# Patient Record
Sex: Female | Born: 1945 | Race: Black or African American | Hispanic: No | Marital: Married | Smoking: Never smoker
Health system: Southern US, Community
[De-identification: ages and names within clinical notes are randomized; demographics above are authoritative.]

## PROBLEM LIST (undated history)

## (undated) DIAGNOSIS — G629 Polyneuropathy, unspecified: Secondary | ICD-10-CM

## (undated) DIAGNOSIS — D689 Coagulation defect, unspecified: Secondary | ICD-10-CM

## (undated) DIAGNOSIS — L930 Discoid lupus erythematosus: Secondary | ICD-10-CM

## (undated) DIAGNOSIS — R011 Cardiac murmur, unspecified: Secondary | ICD-10-CM

## (undated) DIAGNOSIS — I341 Nonrheumatic mitral (valve) prolapse: Secondary | ICD-10-CM

## (undated) DIAGNOSIS — Z972 Presence of dental prosthetic device (complete) (partial): Secondary | ICD-10-CM

## (undated) DIAGNOSIS — J302 Other seasonal allergic rhinitis: Secondary | ICD-10-CM

## (undated) DIAGNOSIS — Z86718 Personal history of other venous thrombosis and embolism: Secondary | ICD-10-CM

## (undated) DIAGNOSIS — G4730 Sleep apnea, unspecified: Secondary | ICD-10-CM

## (undated) DIAGNOSIS — G5600 Carpal tunnel syndrome, unspecified upper limb: Secondary | ICD-10-CM

## (undated) DIAGNOSIS — M65332 Trigger finger, left middle finger: Secondary | ICD-10-CM

## (undated) DIAGNOSIS — IMO0001 Reserved for inherently not codable concepts without codable children: Secondary | ICD-10-CM

## (undated) DIAGNOSIS — M329 Systemic lupus erythematosus, unspecified: Secondary | ICD-10-CM

## (undated) DIAGNOSIS — Z8669 Personal history of other diseases of the nervous system and sense organs: Secondary | ICD-10-CM

## (undated) DIAGNOSIS — K08109 Complete loss of teeth, unspecified cause, unspecified class: Secondary | ICD-10-CM

## (undated) DIAGNOSIS — K219 Gastro-esophageal reflux disease without esophagitis: Secondary | ICD-10-CM

## (undated) DIAGNOSIS — E11319 Type 2 diabetes mellitus with unspecified diabetic retinopathy without macular edema: Secondary | ICD-10-CM

## (undated) DIAGNOSIS — E119 Type 2 diabetes mellitus without complications: Secondary | ICD-10-CM

## (undated) DIAGNOSIS — M1990 Unspecified osteoarthritis, unspecified site: Secondary | ICD-10-CM

## (undated) DIAGNOSIS — Z794 Long term (current) use of insulin: Secondary | ICD-10-CM

## (undated) DIAGNOSIS — R05 Cough: Secondary | ICD-10-CM

## (undated) DIAGNOSIS — I10 Essential (primary) hypertension: Secondary | ICD-10-CM

## (undated) HISTORY — DX: Polyneuropathy, unspecified: G62.9

## (undated) HISTORY — PX: CATARACT EXTRACTION W/ INTRAOCULAR LENS  IMPLANT, BILATERAL: SHX1307

## (undated) HISTORY — DX: Carpal tunnel syndrome, unspecified upper limb: G56.00

## (undated) HISTORY — DX: Cardiac murmur, unspecified: R01.1

## (undated) HISTORY — PX: GLAUCOMA SURGERY: SHX656

## (undated) HISTORY — PX: BACK SURGERY: SHX140

## (undated) HISTORY — PX: JOINT REPLACEMENT: SHX530

## (undated) HISTORY — DX: Coagulation defect, unspecified: D68.9

## (undated) HISTORY — DX: Systemic lupus erythematosus, unspecified: M32.9

---

## 1994-03-16 DIAGNOSIS — Z86718 Personal history of other venous thrombosis and embolism: Secondary | ICD-10-CM

## 1994-03-16 HISTORY — DX: Personal history of other venous thrombosis and embolism: Z86.718

## 1998-10-29 ENCOUNTER — Emergency Department (HOSPITAL_COMMUNITY): Admission: EM | Admit: 1998-10-29 | Discharge: 1998-10-29 | Payer: Self-pay | Admitting: Emergency Medicine

## 1998-11-01 ENCOUNTER — Encounter: Admission: RE | Admit: 1998-11-01 | Discharge: 1998-11-01 | Payer: Self-pay | Admitting: Internal Medicine

## 1998-11-06 ENCOUNTER — Encounter: Admission: RE | Admit: 1998-11-06 | Discharge: 1998-11-06 | Payer: Self-pay | Admitting: Internal Medicine

## 1998-11-20 ENCOUNTER — Encounter: Admission: RE | Admit: 1998-11-20 | Discharge: 1998-11-20 | Payer: Self-pay | Admitting: Internal Medicine

## 1998-11-29 ENCOUNTER — Encounter: Admission: RE | Admit: 1998-11-29 | Discharge: 1998-11-29 | Payer: Self-pay | Admitting: Internal Medicine

## 1998-12-09 ENCOUNTER — Encounter: Admission: RE | Admit: 1998-12-09 | Discharge: 1998-12-09 | Payer: Self-pay | Admitting: Internal Medicine

## 1999-02-27 ENCOUNTER — Encounter: Payer: Self-pay | Admitting: Orthopedic Surgery

## 1999-02-27 ENCOUNTER — Ambulatory Visit (HOSPITAL_COMMUNITY): Admission: RE | Admit: 1999-02-27 | Discharge: 1999-02-27 | Payer: Self-pay | Admitting: Orthopedic Surgery

## 1999-05-27 ENCOUNTER — Encounter: Payer: Self-pay | Admitting: Nephrology

## 1999-05-27 ENCOUNTER — Encounter: Admission: RE | Admit: 1999-05-27 | Discharge: 1999-05-27 | Payer: Self-pay | Admitting: Nephrology

## 1999-06-05 ENCOUNTER — Encounter: Payer: Self-pay | Admitting: Nephrology

## 1999-06-05 ENCOUNTER — Ambulatory Visit (HOSPITAL_COMMUNITY): Admission: RE | Admit: 1999-06-05 | Discharge: 1999-06-05 | Payer: Self-pay | Admitting: Nephrology

## 1999-08-16 ENCOUNTER — Ambulatory Visit: Admission: RE | Admit: 1999-08-16 | Discharge: 1999-08-16 | Payer: Self-pay | Admitting: Nephrology

## 1999-10-17 ENCOUNTER — Encounter: Admission: RE | Admit: 1999-10-17 | Discharge: 1999-10-17 | Payer: Self-pay | Admitting: Nephrology

## 1999-10-17 ENCOUNTER — Encounter: Payer: Self-pay | Admitting: Nephrology

## 1999-11-02 ENCOUNTER — Ambulatory Visit (HOSPITAL_BASED_OUTPATIENT_CLINIC_OR_DEPARTMENT_OTHER): Admission: RE | Admit: 1999-11-02 | Discharge: 1999-11-02 | Payer: Self-pay | Admitting: Nephrology

## 2000-04-28 ENCOUNTER — Other Ambulatory Visit: Admission: RE | Admit: 2000-04-28 | Discharge: 2000-04-28 | Payer: Self-pay | Admitting: Obstetrics and Gynecology

## 2000-08-06 ENCOUNTER — Encounter: Admission: RE | Admit: 2000-08-06 | Discharge: 2000-08-06 | Payer: Self-pay | Admitting: Nephrology

## 2000-08-06 ENCOUNTER — Encounter: Payer: Self-pay | Admitting: Nephrology

## 2001-06-15 ENCOUNTER — Other Ambulatory Visit: Admission: RE | Admit: 2001-06-15 | Discharge: 2001-06-15 | Payer: Self-pay | Admitting: Obstetrics and Gynecology

## 2001-09-05 ENCOUNTER — Encounter: Payer: Self-pay | Admitting: Nephrology

## 2001-09-05 ENCOUNTER — Encounter: Admission: RE | Admit: 2001-09-05 | Discharge: 2001-09-05 | Payer: Self-pay | Admitting: Nephrology

## 2002-07-31 ENCOUNTER — Encounter: Admission: RE | Admit: 2002-07-31 | Discharge: 2002-10-29 | Payer: Self-pay | Admitting: Nephrology

## 2002-12-20 ENCOUNTER — Encounter: Admission: RE | Admit: 2002-12-20 | Discharge: 2003-03-20 | Payer: Self-pay | Admitting: Nephrology

## 2003-04-19 ENCOUNTER — Encounter: Admission: RE | Admit: 2003-04-19 | Discharge: 2003-07-18 | Payer: Self-pay | Admitting: Nephrology

## 2004-02-16 ENCOUNTER — Emergency Department (HOSPITAL_COMMUNITY): Admission: EM | Admit: 2004-02-16 | Discharge: 2004-02-17 | Payer: Self-pay | Admitting: Emergency Medicine

## 2004-02-26 ENCOUNTER — Other Ambulatory Visit: Admission: RE | Admit: 2004-02-26 | Discharge: 2004-02-26 | Payer: Self-pay | Admitting: Obstetrics and Gynecology

## 2004-09-18 ENCOUNTER — Ambulatory Visit: Payer: Self-pay | Admitting: Internal Medicine

## 2004-10-02 ENCOUNTER — Ambulatory Visit: Payer: Self-pay | Admitting: Internal Medicine

## 2005-02-26 ENCOUNTER — Other Ambulatory Visit: Admission: RE | Admit: 2005-02-26 | Discharge: 2005-02-26 | Payer: Self-pay | Admitting: Obstetrics and Gynecology

## 2005-07-02 ENCOUNTER — Encounter: Admission: RE | Admit: 2005-07-02 | Discharge: 2005-07-02 | Payer: Self-pay | Admitting: Nephrology

## 2007-02-01 ENCOUNTER — Inpatient Hospital Stay (HOSPITAL_COMMUNITY): Admission: RE | Admit: 2007-02-01 | Discharge: 2007-02-04 | Payer: Self-pay | Admitting: Orthopaedic Surgery

## 2007-02-01 HISTORY — PX: TOTAL KNEE ARTHROPLASTY: SHX125

## 2007-02-03 ENCOUNTER — Ambulatory Visit: Payer: Self-pay | Admitting: *Deleted

## 2007-04-13 ENCOUNTER — Encounter: Admission: RE | Admit: 2007-04-13 | Discharge: 2007-06-13 | Payer: Self-pay | Admitting: Orthopaedic Surgery

## 2007-09-22 ENCOUNTER — Inpatient Hospital Stay (HOSPITAL_COMMUNITY): Admission: RE | Admit: 2007-09-22 | Discharge: 2007-09-27 | Payer: Self-pay | Admitting: Orthopaedic Surgery

## 2007-09-22 HISTORY — PX: TOTAL KNEE ARTHROPLASTY: SHX125

## 2007-09-26 ENCOUNTER — Ambulatory Visit: Payer: Self-pay | Admitting: Vascular Surgery

## 2007-09-26 ENCOUNTER — Encounter (INDEPENDENT_AMBULATORY_CARE_PROVIDER_SITE_OTHER): Payer: Self-pay | Admitting: Orthopaedic Surgery

## 2007-10-31 ENCOUNTER — Encounter: Admission: RE | Admit: 2007-10-31 | Discharge: 2007-12-01 | Payer: Self-pay | Admitting: Orthopaedic Surgery

## 2007-12-28 ENCOUNTER — Encounter: Admission: RE | Admit: 2007-12-28 | Discharge: 2007-12-28 | Payer: Self-pay | Admitting: Orthopaedic Surgery

## 2008-06-13 ENCOUNTER — Encounter
Admission: RE | Admit: 2008-06-13 | Discharge: 2008-07-19 | Payer: Self-pay | Admitting: Physical Medicine and Rehabilitation

## 2008-09-11 ENCOUNTER — Encounter: Admission: RE | Admit: 2008-09-11 | Discharge: 2008-09-11 | Payer: Self-pay | Admitting: Orthopaedic Surgery

## 2009-01-30 ENCOUNTER — Inpatient Hospital Stay (HOSPITAL_COMMUNITY): Admission: RE | Admit: 2009-01-30 | Discharge: 2009-01-31 | Payer: Self-pay | Admitting: Neurosurgery

## 2009-01-30 HISTORY — PX: HEMILAMINOTOMY LUMBAR SPINE: SUR654

## 2009-09-18 ENCOUNTER — Encounter (INDEPENDENT_AMBULATORY_CARE_PROVIDER_SITE_OTHER): Payer: Self-pay | Admitting: *Deleted

## 2010-01-03 ENCOUNTER — Encounter: Admission: RE | Admit: 2010-01-03 | Discharge: 2010-01-03 | Payer: Self-pay | Admitting: Nephrology

## 2010-04-15 NOTE — Letter (Signed)
Summary: Colonoscopy Letter  [facility]  [address] [place], [place] [address]   Phone: [phone]  Fax: [phone]      September 18, 2009 MRN: [id number]   [facility] [address] [place], [place]  [address]   Dear Ms. Boutin,   According to your medical record, it is time for you to schedule a Colonoscopy. The American Cancer Society recommends this procedure as a method to detect early colon cancer. Patients with a family history of colon cancer, or a personal history of colon polyps or inflammatory bowel disease are at increased risk.  This letter has beeen generated based on the recommendations made at the time of your procedure. If you feel that in your particular situation this may no longer apply, please contact our office.  Please call our office at [phone] to schedule this appointment or to update your records at your earliest convenience.  Thank you for cooperating with [place] to provide you with the very best care possible.   Sincerely,  Hedwig Morton. Juanda Chance, M.D.  [facility] [phone]

## 2010-05-19 ENCOUNTER — Other Ambulatory Visit: Payer: Self-pay | Admitting: Nephrology

## 2010-05-19 ENCOUNTER — Ambulatory Visit
Admission: RE | Admit: 2010-05-19 | Discharge: 2010-05-19 | Disposition: A | Discharge status: Home / Self Care | Payer: Medicare Other | Source: Ambulatory Visit | Attending: Nephrology | Admitting: Nephrology

## 2010-05-19 DIAGNOSIS — R52 Pain, unspecified: Secondary | ICD-10-CM

## 2010-06-18 LAB — BASIC METABOLIC PANEL
BUN: 11 mg/dL (ref 6–23)
CO2: 28 mEq/L (ref 19–32)
Calcium: 9.3 mg/dL (ref 8.4–10.5)
Chloride: 103 mEq/L (ref 96–112)
Creatinine, Ser: 0.87 mg/dL (ref 0.4–1.2)
GFR calc Af Amer: >60 mL/min (ref >60)
GFR calc non Af Amer: >60 mL/min (ref >60)
Glucose, Bld: 272 mg/dL — ABNORMAL HIGH (ref 70–99)
Potassium: 4.7 mEq/L (ref 3.5–5.1)
Sodium: 138 mEq/L (ref 135–145)

## 2010-06-18 LAB — PROTIME-INR
INR: 0.95 (ref 0.00–1.49)
Prothrombin Time: 12.6 seconds (ref 11.6–15.2)

## 2010-06-18 LAB — GLUCOSE, CAPILLARY
Glucose-Capillary: 276 mg/dL — ABNORMAL HIGH (ref 70–99)
Glucose-Capillary: 289 mg/dL — ABNORMAL HIGH (ref 70–99)
Glucose-Capillary: 312 mg/dL — ABNORMAL HIGH (ref 70–99)
Glucose-Capillary: 327 mg/dL — ABNORMAL HIGH (ref 70–99)
Glucose-Capillary: 351 mg/dL — ABNORMAL HIGH (ref 70–99)
Glucose-Capillary: 401 mg/dL — ABNORMAL HIGH (ref 70–99)
Glucose-Capillary: 449 mg/dL — ABNORMAL HIGH (ref 70–99)

## 2010-06-18 LAB — APTT: aPTT: 28 seconds (ref 24–37)

## 2010-06-18 LAB — CBC
HCT: 38.6 % (ref 36.0–46.0)
Hemoglobin: 13 g/dL (ref 12.0–15.0)
MCHC: 33.8 g/dL (ref 30.0–36.0)
MCV: 89.3 fL (ref 78.0–100.0)
Platelets: 319 10*3/uL (ref 150–400)
RBC: 4.32 MIL/uL (ref 3.87–5.11)
RDW: 14.9 % (ref 11.5–15.5)
WBC: 6.2 10*3/uL (ref 4.0–10.5)

## 2010-06-18 LAB — GLUCOSE, RANDOM: Glucose, Bld: 455 mg/dL — ABNORMAL HIGH (ref 70–99)

## 2010-07-14 ENCOUNTER — Other Ambulatory Visit: Payer: Self-pay | Admitting: Neurosurgery

## 2010-07-14 DIAGNOSIS — M542 Cervicalgia: Secondary | ICD-10-CM

## 2010-07-15 ENCOUNTER — Ambulatory Visit
Admission: RE | Admit: 2010-07-15 | Discharge: 2010-07-15 | Disposition: A | Discharge status: Home / Self Care | Payer: Medicare Other | Source: Ambulatory Visit | Attending: Neurosurgery | Admitting: Neurosurgery

## 2010-07-15 DIAGNOSIS — M542 Cervicalgia: Secondary | ICD-10-CM

## 2010-07-25 ENCOUNTER — Other Ambulatory Visit (HOSPITAL_COMMUNITY): Payer: Self-pay | Admitting: Neurosurgery

## 2010-07-25 DIAGNOSIS — M542 Cervicalgia: Secondary | ICD-10-CM

## 2010-07-29 NOTE — Op Note (Signed)
[name], Jackson            ACCOUNT NO.:  [id number]   MEDICAL RECORD NO.:  [id number]          PATIENT TYPE:  INP   LOCATION:  5002                         FACILITY:  [facility]   PHYSICIAN:  Claude Manges. Whitfield, M.D.DATE OF BIRTH:  1945/12/22   DATE OF PROCEDURE:  09/22/2007  DATE OF DISCHARGE:                               OPERATIVE REPORT   PREOPERATIVE DIAGNOSIS:  End-stage osteoarthritis, right knee.   POSTOPERATIVE DIAGNOSIS:  End-stage osteoarthritis, right knee.   PROCEDURE:  Right total knee replacement.   SURGEON:  Claude Manges. Cleophas Dunker, MD.   ASSISTANTArlys John D. Petrarca, PA-C.   ANESTHESIA:  General with supplemental femoral nerve block.   COMPLICATIONS:  None.   COMPONENTS:  DePuy LCS standard femoral component, #2.5 keeled tibial  tray with a 10-mm polyethylene bridging bearing, and 3-Peg metal-back  rotating patella, all was secured polymethyl methacrylate.   PROCEDURE:  With the patient comfortable on the operating room table,  she was then placed under general orotracheal anesthesia.  She received  a preoperative femoral nerve block in the holding area.  A Foley  catheter was inserted by the nursing staff, the urine was clear.   Tourniquet was applied to the right lower extremity which had been  appropriately marked as the operative knee in the holding area.  The leg  was then elevated and was prepped with Betadine scrub and DuraPrep from  the tourniquet to the midfoot.  Sterile draping was performed, with the  extremity still elevated and was Esmarch exsanguinated with the proximal  tourniquet at 350 mmHg.   A midline longitudinal incision was then made centered about the patella  extending from the superior pouch tibial tubercle.  Via sharp  dissection, the incision carried down to the subcutaneous tissue.  The  first layer of capsule was incised in the midline with the Bovie.  The  medial parapatellar incision was made to the deep capsule with the  Bovie.  There was a clear yellow joint effusion.   The patella was everted to 180 degrees.  The knee then flexed to 90  degrees.  The joint was inspected.  There were large osteophytes  circumferentially about the patella and both femoral condyles.  There  was complete absence of articular cartilage in the medial compartment.  Osteophytes were removed from the medial and lateral femoral condyle and  medial tibial plateau.  Synovectomy was performed.   Preoperatively, we had templated the standard femoral component.  This  was confirmed intraoperatively.  We also confirmed a 2.5 size 2.5 tibial  tray.   Initial cut was then made transversely in the proximal tibia using the  external tibial guide.  There was approximately 7-degree of posterior  declination.  A second cut was then made on the femur anteriorly and  then posteriorly.  We tried flexion and extension gaps, were a little  bit tight in extension, so we made one more cut in the distal femur with  excellent flexion/extension gaps, i.e., symmetrical medial release was  necessary.   The final femoral cut was then made to taper the cuts.  Lamina spreader  was  inserted.  I removed the medial and lateral menisci and any remnants  of ACL and PCL.  Osteophytes were removed from the posterior femoral  condyle.   Retractor was then placed about the tibia and was advanced anteriorly.  A #2.5 tibial tray was perfect fit; this was applied.  Center hole made  followed by the keeled cut.  The 10 mm bridging bearing was applied to  the trial tibial template followed by the standard femoral component.  The knee was then placed through a full range of motion.  There was no  opening with varus or valgus stress.  Negative anterior drawer sign.   The patella was then prepared by removing 10 mm of bone leaving 13-mm  patella thickness.  The patellar jig was applied to 3 holes made, and  the trial patella inserted.  Again through full range of  motion, there  was no subluxation of the patella.   At that point, the trial components were removed.  The joint was  copiously irrigated with jet saline.  The knee was then flexed.  Retractor was inserted.  The #2.5 tibial tray was then applied with  polymethyl methacrylate followed by the 10-mm bridging bearing.  The  standard femoral component was applied with polymethyl methacrylate.  The components were then packed and the knee was placed in extension and  then flexion and any extraneous methacrylate was removed with Glorious Peach.  The patella was applied with methacrylate and patellar clamp.  While  waiting for the methacrylate to mature, we injected Marcaine with  epinephrine into the deep capsule and 10 mL of FloSeal around the joint  for hemostasis.  At approximately 15 minutes, the methacrylate was  hardened.  We gently irrigated the joint.  Tourniquet was deflated.  We  had very minimal bleeding.  There was nice capillary refill to the joint  surface.   Hemovac was not necessary.   The deep capsule was closed with interrupted #1 Ethibond.  Superficial  capsule was closed with a running 0 Vicryl, subcu was closed with 0 and  2-0 Vicryl, and skin was closed with skin clips.  Sterile bulky dressing  was applied followed by the patient's support stocking.   The patient tolerated the procedure without complications.      Claude Manges. Cleophas Dunker, M.D.  Electronically Signed     PWW/MEDQ  D:  09/22/2007  T:  09/23/2007  Job:  [id number]

## 2010-07-29 NOTE — Op Note (Signed)
NAMEKYASIA, STEUCK            ACCOUNT NO.:  [id number]   MEDICAL RECORD NO.:  [id number]          PATIENT TYPE:  INP   LOCATION:  2899                         FACILITY:  [facility]   PHYSICIAN:  Claude Manges. Whitfield, M.D.DATE OF BIRTH:  08-07-1945   DATE OF PROCEDURE:  02/01/2007  DATE OF DISCHARGE:                               OPERATIVE REPORT   PREOPERATIVE DIAGNOSIS:  End stage osteoarthritis, left knee.   POSTOPERATIVE DIAGNOSIS:  End stage osteoarthritis, left knee.   PROCEDURE:  Left total knee replacement.   SURGEON:  Claude Manges. Cleophas Dunker, M.D.   ASSISTANT:  Richardean Canal, P.A.- C.   ANESTHESIA:  General with supplemental femoral nerve block.   COMPLICATIONS:  None.   COMPONENTS:  DePuy LCS standard femoral component, number 3 keeled  tibial tray with a 15 mm bridging bearing and metal backed three peg  rotating patella, all were secured with polymethyl methacrylate.   PROCEDURE IN DETAIL:  With the patient comfortable on the operating  table and under general orotracheal anesthesia, the nursing staff  inserted a Foley catheter.  The left lower extremity was then placed in  a thigh tourniquet.  The leg was prepped with Betadine scrub and then  DuraPrep from the tourniquet to the mid foot.  Sterile draping was  performed.  With the leg still elevated, it was Esmarch exsanguinated  with a proximal tourniquet at 350 mmHg.   A midline longitudinal incision was made centered over the patella  extending from the tibial tubercle to the superior pouch. Via sharp  dissection, the incision was carried down to the subcutaneous tissue.  The first layer of capsule was incised in the midline.  A medial  parapatellar incision was made with the Bovie.  There was a clear yellow  joint effusion.  The patella was then everted 180 degrees and the knee  flexed to 90 degrees. The patient had a fixed varus deformity. A medial  release was performed.  There were large osteophytes along the  medial  and lateral femoral condyle which were resected to template the tibia  and there were osteophytes along the medial tibial plateau which were  also excised.  Synovectomy was performed.  There were several loose  bodies identified and these were removed, as well.  Preoperatively, we  had measured a standard femoral components and this was confirmed  intraoperatively.  Subsequent cuts were then made on the tibia and femur  to accept the components.   The first cut was made transversely on the proximal tibia with a 7  degrees posterior angle of declination. Subsequent cuts were then made  on the femur with a 4 degrees distal femoral valgus cut.  Flexion/extension gaps were symmetrical at 12.5.  LCL and MCL remained  intact. ACL and PCL were excised as well as the medial and lateral  menisci using the lamina spreader after completing the femoral cuts.  Osteophytes were removed from the posterior aspect of the medial and  lateral femoral condyle.  The popliteus was incised laterally as we were  tighter laterally that we were medially after the medial release   Retractors were  then placed about the tibia.  We measured a #3 tibial  tray.  This was confirmed intraoperatively.  The center hole was then  made followed by the keeled.  We initially trialed a 12.5 mm bridging  bearing and I felt we opened up a little bit too much medially and  laterally with hyperextension.  A 15 mm bridging bearing was then  inserted.  We had perfect stability with a negative anterior drawer sign  and extension without hyperextension.   The patella was then prepared by removing 10 mm of bone leaving 13 mm of  patella thickness.  The patella jig was applied. Three holes made and  the trial patella inserted.  There was a full range of motion and it was  stable.   The trial components were removed.  The joint was copiously irrigated  with jet saline. With the knee flexed and retractors about the tibia,  the  tibial component was inserted with polymethyl methacrylate. The  extraneous methacrylate was removed.  The final 15 mm bridging bearing  was inserted followed by the cemented femur. Again, extraneous  methacrylate was removed.  The patella was applied with cement and a  patellar clamp.  After complete maturation, the joint was inspected.  Any further hardened extraneous methacrylate was removed via cold a 1/2  inch osteotome.   The joint was then copiously irrigated with jet saline.  There was no  loose material.  The tourniquet was deflated with immediate capillary  refill to the joint surface.  A Hemovac was inserted.  The deep capsule  was closed with interrupted #1 Ethilon, the superficial capsule closed  with a running 0 Vicryl, subcu with 2-0 Vicryl, and the skin closed with  skin clips.  A sterile bulky dressing was applied followed by an Ace  bandage.  The patient tolerated the procedure well without  complications.      Claude Manges. Cleophas Dunker, M.D.  Electronically Signed     PWW/MEDQ  D:  02/01/2007  T:  02/01/2007  Job:  [id number]

## 2010-07-29 NOTE — Discharge Summary (Signed)
Kaitlyn Jackson, Kaitlyn Jackson            ACCOUNT NO.:  [id number]   MEDICAL RECORD NO.:  [id number]          PATIENT TYPE:  INP   LOCATION:  5002                         FACILITY:  [facility]   PHYSICIAN:  Claude Manges. Whitfield, M.D.DATE OF BIRTH:  1946-03-08   DATE OF ADMISSION:  09/22/2007  DATE OF DISCHARGE:  09/27/2007                               DISCHARGE SUMMARY   ADMISSION DIAGNOSIS:  Osteoarthritis of the right knee.   DISCHARGE DIAGNOSES:  1. Osteoarthritis of the right knee.  2. Status post left total knee arthroplasty.  3. Insulin-dependent diabetes mellitus.  4. Mitral valve prolapse with murmur.  5. Sleep apnea.  6. History of deep venous thrombosis on Coumadin.  7. Acute blood loss anemia.  8. Hyponatremia.  9. Exogenous obesity.   PROCEDURE:  Right total knee arthroplasty.   HISTORY:  A 65 year old African American female with longstanding right  knee pain.  She is status post left total knee arthroplasty with good  results.  Now having constant severe aching pain in her right knee with  swelling now which is worsening.  She is now having difficulty with  activities of daily living.  She is now indicated for right total knee  arthroplasty.   HOSPITAL COURSE:  A 65 year old African American female admitted on September 22, 2007.  After appropriate laboratory studies were obtained, as well  as 2 gm of Ancef IV on-call to the operating room, she was taken to the  operating room where she underwent a right total knee arthroplasty.  She  tolerated the procedure well.  She was placed on a diabetic diet  postoperatively.  Ancef 1 gm IV q.6 h., for three doses was continued.  A Foley was placed intraoperatively.  CPM was placed from 0 to 60  degrees for 6-8 hours per day.  This was increased by 5 to 10 degrees  per day.  Consults with PT, OT and care management were made.  Partial  weightbearing, 50% body weight.  A Dilaudid PCA pump was ordered in  reduced dose.  Sliding scale was  ordered, moderate sensitivity.  CPAP  was ordered for her sleep apnea.  She was allowed out of bed to chair  the following day.  Incentive spirometry q.1 h., while wake.  She was  weaned off O2.  Care management for [facility].  She was weaned off of  her PCA.  On September 23, 2007, her Novolin insulin was held and Dr. Bascom Levels  was consulted for medical care.  PCA was discontinued on September 24, 2007,  and her dressing was changed.  Her IV was discontinued on September 24, 2007.  Her TED hose was too constrictive and was not able to be used.  A  Doppler was ordered of her right lower extremity which was read as  negative.  She has had otherwise an improved hospital course and is  indicated for discharge.  EKG of January 26, 2007, revealed normal  sinus rhythm, normal ECG.   RADIOGRAPHIC STUDIES:  Chest x-ray on January 26, 2007, showed no  evidence for active chest disease.   LABORATORY STUDIES:  Revealed  a hemoglobin 12.7, hematocrit 37.3%, white  count 8100, platelets were 389,000.  Discharge hemoglobin 10.0,  hematocrit 30.0, white count 8400, platelets 401,000.  Protime preop  13.4, INR 1.0, PTT 31.  On September 27, 2007, protime was 19.1, INR 1.5.  Preop chemistries; sodium 133, potassium 4.3, chloride 100, CO2 of 26,  glucose 75, BUN 18, creatinine 1.00, GFR was greater than 60.  Bilirubin  total 0.6, alkaline phosphatase 125, SGOT 19, SGPT 17, total protein  7.3, albumin 3.5, calcium 9.2.  She did have hyponatremia and dropped to  130 on September 24, 2007 and September 25, 2007.  At the time of discharge, she  had a sodium of 135, potassium 4.0, chloride 99, CO2 of 21, glucose 130,  BUN 11, creatinine 0.87, GFR greater than 16, calcium was 8.6.  Hemoglobin A1c was 8.9 on September 20, 2007.  Thyroxin T4 level was 6.8.  TSH was 1.871 and T3 was 125.7.  A 25-hydroxy vitamin D level was 7.  Urinalysis was benign for a voided urine.  Urine culture preoperatively  showed greater than 1000 colonies of multiple  bacterial morphophytes.  Repeat cultures x2 revealed no growth.   DISCHARGE INSTRUCTIONS:  She should be on a 60 gm carb modified diet.  Keep her incision clean and dry.  Use a dressing over the wound, and  this to be changed daily.  Call if she has drainage, increasing pain, or  redness about the incision.  She may shower and allow the water to run  over the incision.  No washing of the incision.  She is to use her  walker and ambulate 50% weightbearing as taught in PT.  No lifting or  driving for 6 weeks.  CPM 0 to 80 degrees for 6-8 hours per day.  She  may increase by 5 to 10 degrees daily.   MEDICATIONS:  1. Flovent 44 mcg inhaler 2 puffs b.i.d.  2. Benazepril hydrochloride 40 mg q.a.m.  3. Singulair 10 mg q.p.m.  4. Gabapentin 300 mg t.i.d.  5. Verapamil SA 180 b.i.d.  6. Amitriptyline 25 mg q.p.m.  7. Nexium 40 mg b.i.d.  8. Lipitor 20 mg 1/2 tablet h.s.  9. Warfarin as per pharmacy protocol to keep the INR between 2.0 and      3.0.  10.Heparin 5000 units subcu q.12 h until her INR is greater than 1.8.  11.Percocet 5/325 one to two p.o. q.4 h., p.r.n. pain.  12.Robaxin 500 mg 1 p.o. q.6-8 p.r.n. spasms.  13.Lasix 40 mg 1 p.o. q.a.m.  14.Levemir Flexpen 100 units per mL 50 units subcu h.s.  15.NovoLog to be determined by the staff physician caring for her at      the nursing facility.   She was discharged in improved condition.  In addition, she will need to  be on followed back up with Dr. Cleophas Dunker on October 07, 2007.  Need to  call for an appointment at [phone].  Call the office if she has any  increased drainage or redness about the wound or calf pain.  If she has  chest pain or shortness of breath, call [phone].      Oris Drone Petrarca, P.A.-C.      Claude Manges. Cleophas Dunker, M.D.  Electronically Signed    BDP/MEDQ  D:  09/27/2007  T:  09/27/2007  Job:  [id number]

## 2010-07-29 NOTE — Discharge Summary (Signed)
NAMELATORA, QUARRY            ACCOUNT NO.:  [id number]   MEDICAL RECORD NO.:  [id number]          PATIENT TYPE:  INP   LOCATION:  5036                         FACILITY:  [facility]   PHYSICIAN:  Claude Manges. Whitfield, M.D.DATE OF BIRTH:  August 25, 1945   DATE OF ADMISSION:  02/01/2007  DATE OF DISCHARGE:  02/04/2007                               DISCHARGE SUMMARY   ADMISSION DIAGNOSIS:  Advanced osteoarthritis of both knees, left  greater than right.   DISCHARGE DIAGNOSES:  1. Advanced osteoarthritis of both knees, left greater than right.  2. Acute blood loss anemia.  3. Diabetes mellitus.  4. Hypertension.  5. History of mitral valve prolapse.  6. History of deep venous thrombosis on Coumadin.  7. History of sleep apnea.  8. History of gastroesophageal reflux disease.  9. History of asthma.  10.Hypercholesterolemia.  11.Obesity.   PROCEDURE:  Left total knee arthroplasty February 01, 2007.   HISTORY:  This 65 year old African American female with a 5-10 year  history of gradual onset of progressive left knee pain. She denies any  history of injury or trauma.  Pain is constant especially with  weightbearing.  It is an aching type pain to sharp pain, diffuse about  the knee without radiation.  It worsens with walking and decreases with  rest.  She is using Tylenol, gabapentin, sulindac which is only giving  her minimal relief.  Occasional night time pain.  She has undergone  corticosteroids and Synvisc injections with some relief.  She has used a  cane for the last 2-3 years.  She is now indicated for total knee  arthroplasty.   HOSPITAL COURSE:  A 65 year old African American female admitted  February 01, 2007, after appropriate laboratory studies were obtained as  well as 2 g of Ancef IV on call to the operating room and heparin 3000  units subcu on call to the OR.  She was taken to the operating room  where she underwent a left total knee arthroplasty by Dr. Norlene Campbell,  assisted by Rexene Edison, PAC.  She tolerated the procedure  well.  She was placed on a Dilaudid PCA pump.  She was placed on a carb  modified diet.  Foley was placed intraoperatively.  Consultations with  PT and OT were made.  She was allowed to be partial weightbearing 50%.  CPM was placed from 0-90 degrees for 6-8 hours per day.  She was allowed  out of bed to a chair that day.  Aggressive pulmonary toilet was ordered  on February 02, 2007.  A knee immobilizer was placed to the left knee  for ambulation.  She was continued on her Warfarin.  On February 03, 2007, her dressings were changed.  She was placed on OxyContin 10 mg  p.o. q.12h.  A venous Doppler was ordered to rule out DVT and this was  noted to be no evidence of DVT or superficial thrombosis, both lower  extremities.  Fluid noted in the popliteal fossa on the right.  Her sats  were titrated with oxygen and this was then discontinued.  Apparently  she has been able  to walk to the door in her room but because of her  need for care, she has decided upon a skilled nursing facility rehab for  a couple of weeks until she is upright and able to care for herself.   LABORATORY DATA:  She is admitted with a hemoglobin of 12.7, hematocrit  38%, white count 7900, platelets 372,000.  Discharge hemoglobin 9.9,  hematocrit 29.7%, white count 10,500, platelets 303,000.  Protime  preoperatively was 13.9, INR 1.1 and PTT is 27.  Chemistries revealed a  preoperative sodium 136, potassium 4.4, chloride 102, CO2 26, glucose  249, BUN 18, creatinine 0.91.  Calcium 9.  Total protein 7.3, albumin  3.4, SGOT 17, SGPT 16, alkaline phosphatase 116, bilirubin 0.4.  GFR  greater than 60.  Discharge sodium 136, potassium 3.9, chloride 96, CO2  30, glucose 92, BUN 15, creatinine 1.02.  She did decrease to sodium of  131 on February 03, 2007.  INR and protime on the day of discharge was  17.6 with an INR of 1.4.   RADIOGRAPHIC STUDIES:  No evidence of active  chest disease.   EKG of January 26, 2007, showed normal sinus rhythm, normal EKG.   Doppler as noted above.   DISCHARGE INSTRUCTIONS:  She is to be placed on a carb modified diet.  She is to use crutches or a walker, partial weightbearing 50% on her  left leg as instructed.  No lifting or driving for six weeks.  Increase  her activities slowly.  They are to follow the blue instruction sheet  that accompanies her.  This will include daily dressing changes to the  left knee.  Keep the incision clean and dry.  She may shower on February 06, 2007.  She is not to wash over the incision but she can allow the  water to run over the incision.  Once this has been complete, the  incision should be left to dry and can be painted with a layer of  Betadine and then a new dressing applied.  CPM from 0-90 degrees for 6-8  hours per day.  This should be started gradually, starting around 60-70  degrees and then increased in 15 minute increments by 5 degrees until  she gets to 90-95 degrees.  She will need to follow back up in the  office on February 14, 2007.  They will need to call the office at [phone] and make an appointment for this.   MEDICATIONS:  1. Levemir FlexPen 50 units subcu h.s.  2. NovoLog 70/30 FlexPen 50 units subcu in a.m.  3. NovoLog 70/30 FlexPen 30 units h.s.  4. Flovent two puffs b.i.d.  5. Verapamil SA 180 mg b.i.d.  6. Nexium 40 mg b.i.d.  7. Benazepril 40 mg daily.  8. Lasix 40 mg daily.  9. Fexofenadine 180 mg daily.  10.Lipitor 10 mg daily.  11.Amitriptyline 25 mg b.i.d.  12.Gabapentin 300 mg t.i.d.  13.Laxative of choice of Milk of Magnesia 30 mL p.o. p.r.n.      constipation.  These above medications need to be checked with her preoperative dosing  to be verified.  For pain management, OxyContin 10 mg p.o. q.12h. for  pain for a total of 10 days.  For breakthrough pain she may use Norco  5/325 one to two p.o. q.4h. p.r.n. pain.   We should be called if there  are any problems with her wounds.  She will  follow back up in the office as noted  above.   CONDITION ON DISCHARGE:  Improved.      Oris Drone Petrarca, P.A.-C.      Claude Manges. Cleophas Dunker, M.D.  Electronically Signed    BDP/MEDQ  D:  02/04/2007  T:  02/04/2007  Job:  [id number]

## 2010-08-08 ENCOUNTER — Ambulatory Visit (HOSPITAL_COMMUNITY)
Admission: RE | Admit: 2010-08-08 | Discharge: 2010-08-08 | Disposition: A | Discharge status: Home / Self Care | Payer: Medicare Other | Source: Ambulatory Visit | Attending: Neurosurgery | Admitting: Neurosurgery

## 2010-08-08 DIAGNOSIS — M542 Cervicalgia: Secondary | ICD-10-CM

## 2010-08-08 DIAGNOSIS — M25519 Pain in unspecified shoulder: Secondary | ICD-10-CM | POA: Insufficient documentation

## 2010-08-08 DIAGNOSIS — M5020 Other cervical disc displacement, unspecified cervical region: Secondary | ICD-10-CM | POA: Insufficient documentation

## 2010-08-08 LAB — GLUCOSE, CAPILLARY
Glucose-Capillary: 147 mg/dL — ABNORMAL HIGH (ref 70–99)
Glucose-Capillary: 125 mg/dL — ABNORMAL HIGH (ref 70–99)

## 2010-08-08 MED ORDER — IOHEXOL 300 MG/ML  SOLN
10.0000 mL | Freq: Once | INTRAMUSCULAR | Status: AC | PRN
  Administered 2010-08-08: 10 mL via INTRATHECAL

## 2010-08-12 ENCOUNTER — Other Ambulatory Visit: Payer: Self-pay | Admitting: Neurosurgery

## 2010-08-12 DIAGNOSIS — R9389 Abnormal findings on diagnostic imaging of other specified body structures: Secondary | ICD-10-CM

## 2010-08-14 ENCOUNTER — Ambulatory Visit
Admission: RE | Admit: 2010-08-14 | Discharge: 2010-08-14 | Disposition: A | Discharge status: Home / Self Care | Payer: Medicare Other | Source: Ambulatory Visit | Attending: Neurosurgery | Admitting: Neurosurgery

## 2010-08-14 DIAGNOSIS — R9389 Abnormal findings on diagnostic imaging of other specified body structures: Secondary | ICD-10-CM

## 2010-12-11 LAB — PROTIME-INR
INR: 1
INR: 1.2
INR: 1.3
INR: 1.5
Prothrombin Time: 14.3
Prothrombin Time: 15.7 — ABNORMAL HIGH
Prothrombin Time: 19.1 — ABNORMAL HIGH

## 2010-12-11 LAB — CROSSMATCH: ABO/RH(D): B POS

## 2010-12-11 LAB — CBC
HCT: 28.7 — ABNORMAL LOW
HCT: 29.4 — ABNORMAL LOW
HCT: 33.1 — ABNORMAL LOW
Hemoglobin: 9.8 — ABNORMAL LOW
MCHC: 33.2
MCHC: 33.3
MCV: 86.4
MCV: 86.5
MCV: 87.5
Platelets: 292
Platelets: 303
Platelets: 341
Platelets: 389
Platelets: 401 — ABNORMAL HIGH
RBC: 3.48 — ABNORMAL LOW
RDW: 16 — ABNORMAL HIGH
RDW: 16.1 — ABNORMAL HIGH
RDW: 16.7 — ABNORMAL HIGH
WBC: 8.1
WBC: 8.4

## 2010-12-11 LAB — URINALYSIS, ROUTINE W REFLEX MICROSCOPIC
Glucose, UA: NEGATIVE
Hgb urine dipstick: NEGATIVE
Ketones, ur: NEGATIVE
Protein, ur: NEGATIVE
Urobilinogen, UA: 0.2

## 2010-12-11 LAB — RENAL FUNCTION PANEL
Albumin: 2.3 — ABNORMAL LOW
BUN: 11
Chloride: 96
Creatinine, Ser: 0.93
GFR calc Af Amer: >60
GFR calc non Af Amer: >60
Phosphorus: 2.2 — ABNORMAL LOW

## 2010-12-11 LAB — URINE CULTURE
Colony Count: NO GROWTH
Culture: NO GROWTH
Special Requests: NEGATIVE

## 2010-12-11 LAB — DIFFERENTIAL
Basophils Absolute: 0
Basophils Relative: 1
Eosinophils Relative: 2
Lymphocytes Relative: 26
Monocytes Absolute: 0.7
Monocytes Relative: 9

## 2010-12-11 LAB — COMPREHENSIVE METABOLIC PANEL
AST: 19
Albumin: 2.5 — ABNORMAL LOW
Albumin: 3.5
Alkaline Phosphatase: 125 — ABNORMAL HIGH
BUN: 11
Chloride: 100
Creatinine, Ser: 0.86
GFR calc Af Amer: >60
Glucose, Bld: 152 — ABNORMAL HIGH
Potassium: 4.3
Total Bilirubin: 0.6
Total Bilirubin: 0.7
Total Protein: 5.7 — ABNORMAL LOW
Total Protein: 7.3

## 2010-12-11 LAB — APTT: aPTT: 41 — ABNORMAL HIGH

## 2010-12-11 LAB — TSH: TSH: 1.871 (ref 0.350–4.500)

## 2010-12-11 LAB — BASIC METABOLIC PANEL
BUN: 11
BUN: 19
CO2: 31
Calcium: 8.6
Chloride: 99
Creatinine, Ser: 0.87
GFR calc Af Amer: >60
GFR calc non Af Amer: 56 — ABNORMAL LOW
Glucose, Bld: 130 — ABNORMAL HIGH
Glucose, Bld: 230 — ABNORMAL HIGH
Potassium: 4.8

## 2010-12-11 LAB — T4: T4, Total: 6.8

## 2010-12-11 LAB — T3: T3, Total: 125.7 (ref 80.0–204.0)

## 2010-12-23 LAB — CROSSMATCH: ABO/RH(D): B POS

## 2010-12-23 LAB — BASIC METABOLIC PANEL
CO2: 28
CO2: 28
Calcium: 8.5
Chloride: 96
Chloride: 97
Creatinine, Ser: 1.02
GFR calc Af Amer: >60
GFR calc Af Amer: >60
GFR calc Af Amer: >60
GFR calc non Af Amer: 55 — ABNORMAL LOW
GFR calc non Af Amer: >60
Glucose, Bld: 247 — ABNORMAL HIGH
Potassium: 3.8
Potassium: 3.9
Potassium: 4.6
Sodium: 136

## 2010-12-23 LAB — CBC
HCT: 29.7 — ABNORMAL LOW
HCT: 31.2 — ABNORMAL LOW
HCT: 33.3 — ABNORMAL LOW
HCT: 38
Hemoglobin: 11.1 — ABNORMAL LOW
Hemoglobin: 9.9 — ABNORMAL LOW
Hemoglobin: 12.7
MCHC: 33.2
MCHC: 33.5
MCHC: 33.8
MCHC: 33.6
MCV: 85.3
MCV: 85.9
MCV: 84.6
Platelets: 303
Platelets: 372
RBC: 3.45 — ABNORMAL LOW
RBC: 3.66 — ABNORMAL LOW
RBC: 3.83 — ABNORMAL LOW
RBC: 4.49
RDW: 16 — ABNORMAL HIGH
RDW: 16.3 — ABNORMAL HIGH
RDW: 16.3 — ABNORMAL HIGH
WBC: 10.5
WBC: 11.8 — ABNORMAL HIGH
WBC: 7.9

## 2010-12-23 LAB — PROTIME-INR
INR: 1.1
INR: 1.3
INR: 1.4
INR: 1.1
Prothrombin Time: 16.8 — ABNORMAL HIGH
Prothrombin Time: 17.6 — ABNORMAL HIGH
Prothrombin Time: 13.9

## 2010-12-23 LAB — COMPREHENSIVE METABOLIC PANEL
ALT: 16
AST: 17
Alkaline Phosphatase: 116
CO2: 26
Calcium: 9
Chloride: 102
GFR calc Af Amer: >60
GFR calc non Af Amer: >60
Glucose, Bld: 249 — ABNORMAL HIGH
Potassium: 4.4
Sodium: 136
Total Bilirubin: 0.4

## 2010-12-23 LAB — DIFFERENTIAL
Basophils Absolute: 0
Basophils Relative: 0
Eosinophils Absolute: 0.1 — ABNORMAL LOW
Eosinophils Relative: 1
Neutrophils Relative %: 70

## 2010-12-23 LAB — URINALYSIS, ROUTINE W REFLEX MICROSCOPIC
Bilirubin Urine: NEGATIVE
Bilirubin Urine: NEGATIVE
Glucose, UA: NEGATIVE
Hgb urine dipstick: NEGATIVE
Ketones, ur: NEGATIVE
Ketones, ur: NEGATIVE
Nitrite: NEGATIVE
Protein, ur: NEGATIVE
Protein, ur: NEGATIVE
pH: 6
pH: 6

## 2011-01-15 ENCOUNTER — Encounter (HOSPITAL_BASED_OUTPATIENT_CLINIC_OR_DEPARTMENT_OTHER): Payer: Medicare Other

## 2011-01-27 ENCOUNTER — Ambulatory Visit (HOSPITAL_BASED_OUTPATIENT_CLINIC_OR_DEPARTMENT_OTHER): Payer: Medicare Other | Attending: Nephrology | Admitting: Radiology

## 2011-01-27 ENCOUNTER — Encounter (HOSPITAL_BASED_OUTPATIENT_CLINIC_OR_DEPARTMENT_OTHER): Payer: Self-pay

## 2011-01-27 VITALS — Ht 64.0 in | Wt 256.0 lb

## 2011-01-27 DIAGNOSIS — R259 Unspecified abnormal involuntary movements: Secondary | ICD-10-CM | POA: Insufficient documentation

## 2011-01-27 DIAGNOSIS — G4733 Obstructive sleep apnea (adult) (pediatric): Secondary | ICD-10-CM

## 2011-01-31 DIAGNOSIS — R259 Unspecified abnormal involuntary movements: Secondary | ICD-10-CM

## 2011-01-31 DIAGNOSIS — G4733 Obstructive sleep apnea (adult) (pediatric): Secondary | ICD-10-CM

## 2011-01-31 NOTE — Procedures (Signed)
Kaitlyn Jackson, Kaitlyn Jackson            ACCOUNT NO.:  [id number]  MEDICAL RECORD NO.:  [id number]          PATIENT TYPE:  OUT  LOCATION:  SLEEP CENTER                 FACILITY:  [facility]  PHYSICIAN:  Clinton D. Maple Hudson, MD, FCCP, FACPDATE OF BIRTH:  [birth year] 27[birth year] 1944  DATE OF STUDY:  01/27/2011                           NOCTURNAL POLYSOMNOGRAM  REFERRING PHYSICIAN:  Jarome Matin, M.D.  INDICATION FOR STUDY:  Hypersomnia with sleep apnea.  EPWORTH SLEEPINESS SCORE:  10/24.  BMI 43.9, weight 256 pounds, height 64 inches, neck 14.5 inches.  MEDICATIONS:  Home medications are charted and reviewed.  SLEEP ARCHITECTURE:  Total sleep time 299 minutes with sleep efficiency 79.5%.  Stage I 13.2%, stage II 76.8%, stage III 1.2%, REM 8.9% of total sleep time.  Sleep latency 18.5 minutes, REM latency 242 minutes.  Awake after sleep onset 59 minutes.  Arousal index 37.9.  Bedtime Medication: Levemir 50 units.  Sleep was marked by significant fragmentation with numerous brief awakenings.  RESPIRATORY DATA:  Apnea hypopnea index (AHI) 11.6 per hour.  A total of 58 events were scored, including 18 obstructive apneas and 40 hypopneas. All events were associated with supine sleep position.  REM/AHI 77 per hour.  The study was ordered as a diagnostic NPSG protocol and CPAP titration was not done.  OXYGEN DATA:  Moderately loud snoring with oxygen desaturation to a nadir of 80% and a mean oxygen saturation through the study of 93.6% on room air.  A total of 4.9 minutes was recorded through the total recording time with oxygen saturation less than 88% on room air.  CARDIAC DATA:  Normal sinus rhythm.  MOVEMENT-PARASOMNIA:  Mild limb movement disturbance.  A total of 16 limb jerks were counted of which 6 were associated with arousal or awakening for periodic limb movement with arousal index of 1.2 per hour. No bathroom trips.  IMPRESSIONS-RECOMMENDATIONS: 1. Mild obstructive sleep apnea/hypopnea  syndrome, AHI 11.6 per hour     associated with supine sleep position and REM sleep, REM/AHI 11.6     per hour.  Moderately loud snoring with oxygen desaturation to a     nadir of 80% and mean oxygen saturation through the study of 93.6%     on room air.  A total of 4.9 minutes was recorded, with room air     saturation less than 88% during total recording time on this study. 2. The study was ordered as a diagnostic NPSG protocol and CPAP     titration was not done.  Consider return for dedicated CPAP     titration study or evaluate for alternative management as     clinically appropriate. 3. Sleep was marked by frequent awakenings particularly between 1 and     2 a.m., not clearly related to body movement or respiratory     disturbance during that time.  If this reflects her home pattern,     there may be an insomnia component to her sleep complaints.     Clinton D. Maple Hudson, MD, [facility], FACP Diplomate, Biomedical engineer of Sleep Medicine Electronically Signed    CDY/MEDQ  D:  01/31/2011 10:37:47  T:  01/31/2011 12:37:38  Job:  [id number]

## 2011-02-06 ENCOUNTER — Ambulatory Visit
Admission: RE | Admit: 2011-02-06 | Discharge: 2011-02-06 | Disposition: A | Discharge status: Home / Self Care | Payer: Medicare Other | Source: Ambulatory Visit | Attending: Nephrology | Admitting: Nephrology

## 2011-02-06 ENCOUNTER — Other Ambulatory Visit: Payer: Self-pay | Admitting: Nephrology

## 2011-02-06 DIAGNOSIS — R52 Pain, unspecified: Secondary | ICD-10-CM

## 2011-03-15 ENCOUNTER — Ambulatory Visit (HOSPITAL_BASED_OUTPATIENT_CLINIC_OR_DEPARTMENT_OTHER): Payer: Medicare Other | Attending: Nephrology | Admitting: General Practice

## 2011-03-15 VITALS — Ht 64.0 in | Wt 250.0 lb

## 2011-03-15 DIAGNOSIS — R0989 Other specified symptoms and signs involving the circulatory and respiratory systems: Secondary | ICD-10-CM | POA: Insufficient documentation

## 2011-03-15 DIAGNOSIS — R0609 Other forms of dyspnea: Secondary | ICD-10-CM | POA: Insufficient documentation

## 2011-03-15 DIAGNOSIS — Z9989 Dependence on other enabling machines and devices: Secondary | ICD-10-CM

## 2011-03-15 DIAGNOSIS — I491 Atrial premature depolarization: Secondary | ICD-10-CM | POA: Insufficient documentation

## 2011-03-15 DIAGNOSIS — G4710 Hypersomnia, unspecified: Secondary | ICD-10-CM | POA: Insufficient documentation

## 2011-03-15 DIAGNOSIS — I4949 Other premature depolarization: Secondary | ICD-10-CM | POA: Insufficient documentation

## 2011-03-21 DIAGNOSIS — R0989 Other specified symptoms and signs involving the circulatory and respiratory systems: Secondary | ICD-10-CM

## 2011-03-21 DIAGNOSIS — R0609 Other forms of dyspnea: Secondary | ICD-10-CM

## 2011-03-21 DIAGNOSIS — I4949 Other premature depolarization: Secondary | ICD-10-CM

## 2011-03-21 DIAGNOSIS — I491 Atrial premature depolarization: Secondary | ICD-10-CM

## 2011-03-21 DIAGNOSIS — G4730 Sleep apnea, unspecified: Secondary | ICD-10-CM

## 2011-03-21 DIAGNOSIS — G4710 Hypersomnia, unspecified: Secondary | ICD-10-CM

## 2011-03-22 NOTE — Procedures (Signed)
NAMETREZURE, CRONK            ACCOUNT NO.:  [id number]  MEDICAL RECORD NO.:  [id number]          PATIENT TYPE:  OUT  LOCATION:  SLEEP CENTER                 FACILITY:  [facility]  PHYSICIAN:  Aubreyana Saltz D. Maple Hudson, MD, FCCP, FACPDATE OF BIRTH:  Apr 20, 1945  DATE OF STUDY:  03/15/2011                           NOCTURNAL POLYSOMNOGRAM  REFERRING PHYSICIAN:  Jarome Matin, M.D.  INDICATION FOR STUDY:  Hypersomnia with sleep apnea.  EPWORTH SLEEPINESS SCORE:  4/24.  BMI 43.9, weight 256 pounds, height 64 inches, neck 14.5 inches.  MEDICATIONS:  Home medications are charted and reviewed.  A baseline diagnostic NPSG on January 27, 2011, recorded AHI 11.6 per hour.  Weight was 256 pounds.  CPAP titration is now requested.  SLEEP ARCHITECTURE:  Total sleep time 367.5 minutes with sleep efficiency 92.3%.  Stage I was 6.9%, stage II 83.4%, stage III absent. REM 9.7% of total sleep time.  Sleep latency 4.5 minutes.  REM latency 93 minutes.  Awake after sleep onset 26 minutes.  Arousal index 15.2. Bedtime medication:  Levemir.  RESPIRATORY DATA:  CPAP titration protocol.  CPAP was titrated to 15 CWP, AHI 0 per hour.  The technician reported residual snoring and mild respiratory events.  He switched her to BiPAP (bilevel).  Inspiratory 18 and expiratory 14 CWP, which was associated with AHI 4.3 per hour with a little residual snoring.  She wore an extra small ResMed Mirage Quattro full-face mask with heated humidifier.  OXYGEN DATA:  Mild residual snoring on CPAP with mean oxygen saturation 94.3% on room air.  CARDIAC DATA:  Sinus rhythm with occasional PAC and PVC.  MOVEMENT-PARASOMNIA:  No significant movement disturbance.  No bathroom trips.  IMPRESSIONS-RECOMMENDATIONS: 1. CPAP titration to recommended pressure of 15 CWP, AHI 0 per hour.     Occasional mild respiratory events and some residual snoring were     noted representing significant improvement from baseline.  She wore   an extra small ResMed Mirage Quattro full-face mask with heated     humidifier. 2. Baseline diagnostic NPSG on January 27, 2011, recorded AHI 11.6     per hour.  Weight at that recording was 256 pounds.     Lelaina Oatis D. Maple Hudson, MD, [facility], FACP Diplomate, Biomedical engineer of Sleep Medicine Electronically Signed    CDY/MEDQ  D:  03/21/2011 14:24:18  T:  03/22/2011 03:53:20  Job:  [id number]

## 2011-04-28 ENCOUNTER — Other Ambulatory Visit: Payer: Self-pay | Admitting: Nephrology

## 2011-04-28 ENCOUNTER — Ambulatory Visit
Admission: RE | Admit: 2011-04-28 | Discharge: 2011-04-28 | Disposition: A | Discharge status: Home / Self Care | Payer: Medicare Other | Source: Ambulatory Visit | Attending: Nephrology | Admitting: Nephrology

## 2011-04-28 DIAGNOSIS — R05 Cough: Secondary | ICD-10-CM

## 2011-05-21 ENCOUNTER — Ambulatory Visit
Admission: RE | Admit: 2011-05-21 | Discharge: 2011-05-21 | Disposition: A | Discharge status: Home / Self Care | Payer: Medicare Other | Source: Ambulatory Visit | Attending: Nephrology | Admitting: Nephrology

## 2011-05-21 ENCOUNTER — Other Ambulatory Visit: Payer: Self-pay | Admitting: Nephrology

## 2011-05-21 DIAGNOSIS — R52 Pain, unspecified: Secondary | ICD-10-CM

## 2011-09-21 ENCOUNTER — Other Ambulatory Visit: Payer: Self-pay | Admitting: Nephrology

## 2011-11-02 ENCOUNTER — Telehealth: Payer: Self-pay | Admitting: Internal Medicine

## 2011-11-02 NOTE — Telephone Encounter (Signed)
Recall Project: No contact with pt, letter mailed °

## 2011-11-21 ENCOUNTER — Other Ambulatory Visit: Payer: Self-pay | Admitting: Nephrology

## 2011-12-15 ENCOUNTER — Other Ambulatory Visit (HOSPITAL_COMMUNITY): Payer: Self-pay | Admitting: Orthopaedic Surgery

## 2011-12-15 DIAGNOSIS — M25561 Pain in right knee: Secondary | ICD-10-CM

## 2011-12-22 ENCOUNTER — Encounter (HOSPITAL_COMMUNITY)
Admission: RE | Admit: 2011-12-22 | Discharge: 2011-12-22 | Disposition: A | Discharge status: Home / Self Care | Payer: Medicare Other | Source: Ambulatory Visit | Attending: Orthopaedic Surgery | Admitting: Orthopaedic Surgery

## 2011-12-22 DIAGNOSIS — M25561 Pain in right knee: Secondary | ICD-10-CM

## 2011-12-22 DIAGNOSIS — M25569 Pain in unspecified knee: Secondary | ICD-10-CM | POA: Insufficient documentation

## 2011-12-22 DIAGNOSIS — Z96659 Presence of unspecified artificial knee joint: Secondary | ICD-10-CM | POA: Insufficient documentation

## 2011-12-22 MED ORDER — TECHNETIUM TC 99M MEDRONATE IV KIT
25.0000 | PACK | Freq: Once | INTRAVENOUS | Status: AC | PRN
  Administered 2011-12-22: 25 via INTRAVENOUS

## 2012-02-17 ENCOUNTER — Encounter (INDEPENDENT_AMBULATORY_CARE_PROVIDER_SITE_OTHER): Payer: Self-pay | Admitting: General Surgery

## 2012-02-17 ENCOUNTER — Ambulatory Visit (INDEPENDENT_AMBULATORY_CARE_PROVIDER_SITE_OTHER): Payer: Medicare Other | Admitting: General Surgery

## 2012-02-17 VITALS — BP 138/86 | HR 95 | Temp 98.0°F | Ht 64.0 in | Wt 258.6 lb

## 2012-02-17 DIAGNOSIS — L723 Sebaceous cyst: Secondary | ICD-10-CM

## 2012-02-17 NOTE — Progress Notes (Signed)
Patient ID: Kaitlyn Jackson, female   DOB: 11/29/1945, 66 y.o.   MRN: [id number]  Chief Complaint  Patient presents with  . Pre-op Exam    eval knot on back    HPI Kaitlyn Jackson is a 66 y.o. female.  This patient was referred by Dr. Bascom Levels for evaluation of a mass on her upper back and lower neck. She says that this has been present for many years and has not really increased in size very much but on recent physical exam she was referred by her physician because he thought that he had increased in size. She says it does not cause any discomfort and she has not had any discharge or redness or prior infection. She denies any fevers or any need for prior surgery in this area. She says that her sister did squeeze the cyst and did express some fowl discharge. She is on Coumadin for history of prior DVT. She says that with her other procedures she stopped her Coumadin and then just restarted her Coumadin after her surgery and did not need any injections perioperatively HPI  Past Medical History  Diagnosis Date  . Diabetes mellitus   . Arthritis   . Clotting disorder   . Heart murmur     Past Surgical History  Procedure Date  . 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 Cancer Mother     colon  . Cancer Cousin     lung    Social History History  Substance Use Topics  . Smoking status: Never Smoker   . Smokeless tobacco: Not on file  . Alcohol Use: No    No Known Allergies  No current outpatient prescriptions on file.    Review of Systems Review of Systems All other review of systems negative or noncontributory except as stated in the HPI  Blood pressure 138/86, pulse 95, temperature 98 F (36.7 C), temperature source Temporal, height 5\' 4"  (1.626 m), weight 258 lb 9.6 oz (117.3 kg), SpO2 96.00%.  Physical Exam Physical Exam Physical Exam  Nursing note and vitals reviewed. Constitutional: She is oriented to person,  place, and time. She appears well-developed and well-nourished. No distress.  HENT:  Head: Normocephalic and atraumatic.  Mouth/Throat: No oropharyngeal exudate.  Eyes: Conjunctivae and EOM are normal. Pupils are equal, round, and reactive to light. Right eye exhibits no discharge. Left eye exhibits no discharge. No scleral icterus.  Neck: Normal range of motion. Neck supple. No tracheal deviation present.  Cardiovascular: Normal rate, regular rhythm, normal heart sounds and intact distal pulses.   Pulmonary/Chest: Effort normal and breath sounds normal. No stridor. No respiratory distress. She has no wheezes.  Abdominal: Soft. Bowel sounds are normal. She exhibits no distension and no mass. There is no tenderness. There is no rebound and no guarding.  Musculoskeletal: Normal range of motion. She exhibits no edema and no tenderness.  Neurological: She is alert and oriented to person, place, and time.  Skin: Skin is warm and dry. No rash noted. She is not diaphoretic. No erythema. No pallor.  She does have a 4-5 cm mass in the midline of her upper back and nearing the base of her neck with a central skin punctum consistent with an epidermal inclusion cyst. There is no evidence of drainage or infection. Psychiatric: She has a normal mood and affect. Her behavior is normal. Judgment and thought content normal.    Data Reviewed   Assessment  Epidermal inclusion cyst This is likely an epidermal inclusion cyst without any evidence of infection. She is asymptomatic from this. I did discuss with her the options for excision versus continued observation and I did offer excision of. We discussed the pros and cons of each approach as well as the risk of infection and need for I/D without surgery. We discussed the risk of infection, bleeding, pain, scarring, recurrence, and nerve injury with excision.    Plan    She is going to take information and think about this and discuss with her family and her  primary physician and she will call me back if she would like to schedule any procedure for excision. If she does choose to have this excised then we would request anticoagulation recommendations from Dr. Mikey College, Jakari Sada DAVID 02/17/2012, 1:40 PM

## 2012-03-14 ENCOUNTER — Ambulatory Visit (INDEPENDENT_AMBULATORY_CARE_PROVIDER_SITE_OTHER): Payer: Medicare Other | Admitting: Obstetrics and Gynecology

## 2012-03-14 ENCOUNTER — Encounter: Payer: Self-pay | Admitting: Obstetrics and Gynecology

## 2012-03-14 ENCOUNTER — Ambulatory Visit (INDEPENDENT_AMBULATORY_CARE_PROVIDER_SITE_OTHER): Payer: Medicare Other

## 2012-03-14 ENCOUNTER — Other Ambulatory Visit: Payer: Medicare Other

## 2012-03-14 VITALS — BP 110/60 | HR 72 | Resp 18 | Ht 64.0 in | Wt 257.0 lb

## 2012-03-14 DIAGNOSIS — M949 Disorder of cartilage, unspecified: Secondary | ICD-10-CM

## 2012-03-14 DIAGNOSIS — M8580 Other specified disorders of bone density and structure, unspecified site: Secondary | ICD-10-CM | POA: Insufficient documentation

## 2012-03-14 DIAGNOSIS — I10 Essential (primary) hypertension: Secondary | ICD-10-CM | POA: Insufficient documentation

## 2012-03-14 DIAGNOSIS — E1169 Type 2 diabetes mellitus with other specified complication: Secondary | ICD-10-CM | POA: Insufficient documentation

## 2012-03-14 DIAGNOSIS — M899 Disorder of bone, unspecified: Secondary | ICD-10-CM

## 2012-03-14 DIAGNOSIS — I2510 Atherosclerotic heart disease of native coronary artery without angina pectoris: Secondary | ICD-10-CM | POA: Insufficient documentation

## 2012-03-14 DIAGNOSIS — Z124 Encounter for screening for malignant neoplasm of cervix: Secondary | ICD-10-CM

## 2012-03-14 NOTE — Progress Notes (Signed)
AEX  Last Pap: 10/08/2008 WNL: Yes Regular Periods:no/Post menopausal Contraception: None/Postmenopausal  Monthly Breast exam:yes Tetanus<68yrs:no Nl.Bladder Function:yes Daily BMs:no/have to take laxatives Healthy Diet:yes Calcium:yes Mammogram:yes Date of Mammogram: 03/03/2012 Exercise:no Have often Exercise: None Seatbelt: yes Abuse at home: no Stressful work: Retired Sigmoid-colonoscopy: 2011 WNL/Diverticulitis Bone Density: Yes  PCP: Jeri Cos Change in PMH: Right knee replacement complications  Change in WUJ:WJXB  Subjective:    Kaitlyn Jackson is a 66 y.o. female No obstetric history on file. who presents for annual exam.  The patient has no complaints today except recurrence of knee pain which she is managing with Dr. Cleophas Dunker, her orthopedist  The following portions of the patient's history were reviewed and updated as appropriate: allergies, current medications, past family history, past medical history, past social history, past surgical history and problem list.  Review of Systems Pertinent items are noted in HPI. Gastrointestinal:No change in bowel habits, no abdominal pain, no rectal bleeding Genitourinary:negative for dysuria, frequency, hematuria, nocturia and urinary incontinence    Objective:     There were no vitals taken for this visit.  Weight:  Wt Readings from Last 1 Encounters:  02/17/12 258 lb 9.6 oz (117.3 kg)     BMI: There is no height or weight on file to calculate BMI. General Appearance: Alert, appropriate appearance for age. No acute distress HEENT: Grossly normal Neck / Thyroid: Supple, no masses, nodes or enlargement Lungs: clear to auscultation bilaterally Back: No CVA tenderness Breast Exam: No masses or nodes.No dimpling, nipple retraction or discharge. Cardiovascular: Regular rate and rhythm. S1, S2, no murmur Gastrointestinal: Soft, non-tender, no masses or organomegaly Pelvic Exam: External genitalia: normal general  appearance Vaginal: atrophic mucosa Cervix: normal appearance Adnexa: non palpable Uterus: doesn't feel enlarged Exam limited by body habitus Rectovaginal: normal rectal, no masses Lymphatic Exam: Non-palpable nodes in neck, clavicular, axillary, or inguinal regions Skin: no rash or abnormalities Neurologic: Normal gait and speech, no tremor  Psychiatric: Alert and oriented, appropriate affect.    Urinalysis:Not done    Assessment:    Osteopenia    Plan:   mammogram pap smear return annually or prn Copy of DXA to Dr. Karsten Fells, Erie Noe MD

## 2012-03-15 LAB — VITAMIN D 25 HYDROXY (VIT D DEFICIENCY, FRACTURES): Vit D, 25-Hydroxy: 29 ng/mL — ABNORMAL LOW (ref 30–89)

## 2012-03-17 LAB — PAP IG AND HPV HIGH-RISK

## 2012-05-24 ENCOUNTER — Telehealth (INDEPENDENT_AMBULATORY_CARE_PROVIDER_SITE_OTHER): Payer: Self-pay | Admitting: General Surgery

## 2012-05-24 NOTE — Telephone Encounter (Signed)
Pt called to let Dr. Biagio Quint know she is ready to get the lipoma(s) removed. She reminded [place] that she is on Coumadin (Dr. Bascom Levels) and is available to come back in to be re-evaluated if Dr. Biagio Quint wants her to come.  Last OV was 02/16/13.  Please advise her with the plan.

## 2012-05-31 ENCOUNTER — Telehealth (INDEPENDENT_AMBULATORY_CARE_PROVIDER_SITE_OTHER): Payer: Self-pay

## 2012-05-31 NOTE — Telephone Encounter (Signed)
Spoke to patient regarding her request to proceed with surgery for excision of knot on back.  Patient has been advised that we will need to send a letter to Dr. Bascom Levels for pre-op anticoagulation recommendations. We will call patient to schedule surgery once recommendations are received.

## 2012-06-02 ENCOUNTER — Encounter (INDEPENDENT_AMBULATORY_CARE_PROVIDER_SITE_OTHER): Payer: Self-pay

## 2012-06-02 NOTE — Telephone Encounter (Signed)
Pre-Op anticoagulation recommendations have been faxed to Dr. Bascom Levels @ [phone].

## 2012-06-17 ENCOUNTER — Telehealth (INDEPENDENT_AMBULATORY_CARE_PROVIDER_SITE_OTHER): Payer: Self-pay

## 2012-06-17 NOTE — Telephone Encounter (Signed)
Dr Bascom Levels called to inquire about surgery that needs to be done on the pt.  I informed him that Dr Biagio Quint wants to remove the seb cyst on her neck.  He needs to know about what to do with her Coumadin.  Dr Bascom Levels said she can stop it 2 days before and restart it 3 days after.

## 2012-06-24 ENCOUNTER — Telehealth (INDEPENDENT_AMBULATORY_CARE_PROVIDER_SITE_OTHER): Payer: Self-pay

## 2012-06-24 NOTE — Telephone Encounter (Signed)
Called Dr. Janey Greaser office to see if patient can stop Coumadin 5 day's prior to surgery vs. 2 day's prior.  Office not open on Friday's will call back on Monday.

## 2012-07-12 ENCOUNTER — Telehealth (INDEPENDENT_AMBULATORY_CARE_PROVIDER_SITE_OTHER): Payer: Self-pay

## 2012-07-12 NOTE — Telephone Encounter (Signed)
I have called Dr. Janey Greaser on Thursday 07/07/12 & today and no one answers or no ability to leave a message.  Do you have any suggestions for Ms. Singletons coumadin?  I'm not sure what to do at this point.

## 2012-07-12 NOTE — Telephone Encounter (Signed)
Called Dr. Janey Greaser office to get clearance for patient to stop Coumadin 5 day's prior to surgery vs. 2 day's prior.  Unable to leave a message or speak to anyone, phone just rings.

## 2012-10-05 ENCOUNTER — Ambulatory Visit (INDEPENDENT_AMBULATORY_CARE_PROVIDER_SITE_OTHER): Payer: Medicare Other | Admitting: General Surgery

## 2012-10-05 ENCOUNTER — Encounter (INDEPENDENT_AMBULATORY_CARE_PROVIDER_SITE_OTHER): Payer: Self-pay | Admitting: General Surgery

## 2012-10-05 VITALS — BP 134/78 | HR 72 | Temp 97.2°F | Resp 16 | Ht 64.0 in | Wt 256.4 lb

## 2012-10-05 DIAGNOSIS — L02219 Cutaneous abscess of trunk, unspecified: Secondary | ICD-10-CM

## 2012-10-05 DIAGNOSIS — L02212 Cutaneous abscess of back [any part, except buttock]: Secondary | ICD-10-CM

## 2012-10-05 MED ORDER — OXYCODONE HCL 5 MG PO TABS: 5.0000 mg | ORAL_TABLET | Freq: Four times a day (QID) | ORAL | Status: AC | PRN

## 2012-10-05 MED ORDER — AMOXICILLIN-POT CLAVULANATE 875-125 MG PO TABS: 1.0000 | ORAL_TABLET | Freq: Two times a day (BID) | ORAL | Status: AC

## 2012-10-05 NOTE — Patient Instructions (Signed)
Go to the ER tomorrow as soon as your meeting is done to have drainage of your back infection.  Return to the ER tonight if increasing pain or redness or fevers.

## 2012-10-05 NOTE — Progress Notes (Signed)
Subjective:     Patient ID: Kaitlyn Jackson, female   DOB: 12-24-1945, 67 y.o.   MRN: [id number]  HPI This patient is known to me for prior evaluation in December of last year of a epidermal inclusion cyst at the base of her posterior neck and upper back. At that time it was not infected and she wasn't sure if she wanted to have this excised and she was going to call me back to have this removed after she thought about this further. She called back in April and mentioned that she would like to have this removed but we were waiting for anticoagulation recommendations from her primary physician. The recommendations that came backward to stop the Coumadin 2 days before her procedure and restart them 3 days after her procedure and when we attempted to get back in contact with Dr. Bascom Levels to clarify these atypical recommendations, no further response was obtained. For some reason, she never had her procedure scheduled and she comes in today for with pain and swelling in the area of her prior cyst for the last week. 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 She denies any fevers or chills.    Review of Systems     Objective:   Physical Exam At the site of her known epidermal inclusion cyst at the back of her neck towards the base of her neck and upper back she has a 5 cm area of swelling and some slight erythema. There is no fluctuance but the area is mildly tender and concerning for infection    Assessment:     Infected epidermal inclusion cyst and history of DVTs requiring anticoagulation I think at this does appear to be infected epidermal inclusion cyst.  I discussed with the patient my recommendations for incision and drainage of however given her Coumadin use in the fact that this is a fairly sizable cyst, I would not really feel that comfortable performing this in the office today. I recommended that she be directly admitted to the hospital and I  offered to admit her directly to the surgical service for normalization of her INR and to perform incision and drainage likely with anesthesia in the operating room as soon as her INR normalizes. She expressed understanding of this recommendation but refused direct admission today if because she says that she has a meeting tomorrow morning at 10:00 that she cannot miss. Again I explained that my recommendation was for direct admission and drainage of this abscess but since she refuses admission tonight, she agreed to go to the emergency room tomorrow immediately after her meeting is done so she can be evaluated and have incision and drainage of this abscess. I attempted to from aspirate the cyst and drained some purulent material in the meantime, and I aspirated only about 1 cc of purulent material but this was too thick and not very effective for drainage.  I will place her on antibiotics with Augmentin 875 mg twice a day until she can be admitted to the hospital for formal drainage. I explained to the patient and to her husband and they agreed to go the emergency of the night it is increasing pain, erythema, or fevers or chills.  She knows that by not going to the hospital tonight for treatment, that this could get worse.    Plan:     augmentin Oxycodone Since she is refusing admission tonight, she will go to the ER  tomorrow morning after her meeting for likely admission and treatment of this abscess.

## 2012-10-06 ENCOUNTER — Telehealth (INDEPENDENT_AMBULATORY_CARE_PROVIDER_SITE_OTHER): Payer: Self-pay

## 2012-10-06 ENCOUNTER — Encounter (HOSPITAL_COMMUNITY): Payer: Self-pay | Admitting: Family Medicine

## 2012-10-06 ENCOUNTER — Emergency Department (HOSPITAL_COMMUNITY)
Admission: EM | Admit: 2012-10-06 | Discharge: 2012-10-06 | Disposition: A | Discharge status: Home / Self Care | Payer: Medicare Other | Attending: Emergency Medicine | Admitting: Emergency Medicine

## 2012-10-06 DIAGNOSIS — L723 Sebaceous cyst: Secondary | ICD-10-CM | POA: Insufficient documentation

## 2012-10-06 DIAGNOSIS — Z79899 Other long term (current) drug therapy: Secondary | ICD-10-CM | POA: Insufficient documentation

## 2012-10-06 DIAGNOSIS — L03319 Cellulitis of trunk, unspecified: Secondary | ICD-10-CM | POA: Insufficient documentation

## 2012-10-06 DIAGNOSIS — R011 Cardiac murmur, unspecified: Secondary | ICD-10-CM | POA: Insufficient documentation

## 2012-10-06 DIAGNOSIS — E119 Type 2 diabetes mellitus without complications: Secondary | ICD-10-CM | POA: Insufficient documentation

## 2012-10-06 DIAGNOSIS — L0291 Cutaneous abscess, unspecified: Secondary | ICD-10-CM

## 2012-10-06 DIAGNOSIS — Z8739 Personal history of other diseases of the musculoskeletal system and connective tissue: Secondary | ICD-10-CM | POA: Insufficient documentation

## 2012-10-06 DIAGNOSIS — L02219 Cutaneous abscess of trunk, unspecified: Secondary | ICD-10-CM | POA: Insufficient documentation

## 2012-10-06 DIAGNOSIS — Z862 Personal history of diseases of the blood and blood-forming organs and certain disorders involving the immune mechanism: Secondary | ICD-10-CM | POA: Insufficient documentation

## 2012-10-06 DIAGNOSIS — Z794 Long term (current) use of insulin: Secondary | ICD-10-CM | POA: Insufficient documentation

## 2012-10-06 NOTE — ED Provider Notes (Signed)
History    CSN: [id number] Arrival date & time 10/06/12  1101  First MD Initiated Contact with Patient 10/06/12 1129     Chief Complaint  Patient presents with  . Abscess   (Consider location/radiation/quality/duration/timing/severity/associated sxs/prior Treatment) The history is provided by the patient.  pt presents with infected sebaceous cyst of upper back. Saw surgery yesterday, abx without I and D as concern for coumadin use. No fevers or chils. No other complaints. Pain is mild with palpation. Placed on augmentin yesterday by surgery. Nothing improves her pain.    Past Medical History  Diagnosis Date  . Diabetes mellitus   . Arthritis   . Clotting disorder   . Heart murmur    Past Surgical History  Procedure Laterality Date  . 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 Cancer Mother     colon  . Cancer Cousin     lung   History  Substance Use Topics  . Smoking status: Never Smoker   . Smokeless tobacco: Not on file  . Alcohol Use: No   OB History   Grav Para Term Preterm Abortions TAB SAB Ect Mult Living                 Review of Systems  All other systems reviewed and are negative.    Allergies  Review of patient's allergies indicates no known allergies.  Home Medications   Current Outpatient Rx  Name  Route  Sig  Dispense  Refill  . amitriptyline (ELAVIL) 25 MG tablet   Oral   Take 25 mg by mouth at bedtime.         Marland Kitchen amoxicillin-clavulanate (AUGMENTIN) 875-125 MG per tablet   Oral   Take 1 tablet by mouth 2 (two) times daily.   20 tablet   0   . atorvastatin (LIPITOR) 10 MG tablet   Oral   Take 10 mg by mouth daily.         . benazepril (LOTENSIN) 40 MG tablet   Oral   Take 40 mg by mouth daily.         . Calcium Citrate-Vitamin D (CITRACAL + D PO)   Oral   Take 630 tablets by mouth.         . desonide (DESOWEN) 0.05 % lotion   Topical   Apply 1 application topically 2 (two)  times daily.         Marland Kitchen esomeprazole (NEXIUM) 40 MG capsule   Oral   Take 40 mg by mouth daily before breakfast.         . folic acid (FOLVITE) 1 MG tablet   Oral   Take 1 mg by mouth daily.         . furosemide (LASIX) 40 MG tablet   Oral   Take 40 mg by mouth daily.         . insulin aspart (NOVOLOG) 100 UNIT/ML injection   Subcutaneous   Inject into the skin 3 (three) times daily before meals.         . insulin NPH-insulin regular (NOVOLIN 70/30) (70-30) 100 UNIT/ML injection   Subcutaneous   Inject 40 Units into the skin daily with breakfast.         . insulin NPH-insulin regular (NOVOLIN 70/30) (70-30) 100 UNIT/ML injection   Subcutaneous   Inject 25 Units into the skin daily with supper.         Marland Kitchen  montelukast (SINGULAIR) 10 MG tablet   Oral   Take 10 mg by mouth at bedtime.         . Multiple Vitamins-Minerals (VITAMIN D3 COMPLETE PO)   Oral   Take 1,500 tablets by mouth.         . oxyCODONE (ROXICODONE) 5 MG immediate release tablet   Oral   Take 1 tablet (5 mg total) by mouth every 6 (six) hours as needed for pain.   20 tablet   0   . pregabalin (LYRICA) 100 MG capsule   Oral   Take 100 mg by mouth 2 (two) times daily.         . sitaGLIPtan-metformin (JANUMET) 50-1000 MG per tablet   Oral   Take 1 tablet by mouth 2 (two) times daily with a meal.         . Sulfacetamide Sodium (SODIUM SULFACETAMIDE EX)   Apply externally   Apply topically.         . verapamil (COVERA HS) 180 MG (CO) 24 hr tablet   Oral   Take 180 mg by mouth 2 (two) times daily.         Marland Kitchen warfarin (COUMADIN) 10 MG tablet   Oral   Take 10 mg by mouth 3 (three) times a week.         . warfarin (COUMADIN) 7.5 MG tablet   Oral   Take 7.5 mg by mouth 4 (four) times a week.          BP 142/65  Pulse 100  Temp(Src) 97.9 F (36.6 C)  Resp 18  SpO2 98% Physical Exam  Nursing note and vitals reviewed. Constitutional: She is oriented to person, place,  and time. She appears well-developed and well-nourished.  HENT:  Head: Normocephalic.  Eyes: EOM are normal.  Neck: Normal range of motion.  Pulmonary/Chest: Effort normal.  Abdominal: She exhibits no distension.  Musculoskeletal: Normal range of motion.  Large sebaceous cyst with evidence of abscess and no significant cellulitis of the mid upper back. Pointing without drainage. Mild tenderness  Neurological: She is alert and oriented to person, place, and time.  Psychiatric: She has a normal mood and affect.    ED Course  Procedures (including critical care time)  INCISION AND DRAINAGE Performed by: Lyanne Co Consent: Verbal consent obtained. Risks and benefits: risks, benefits and alternatives were discussed Time out performed prior to procedure Type: abscess Body area: mid upper back Anesthesia: local infiltration Incision was made with a scalpel. Local anesthetic: lidocaine 2% with epinephrine Anesthetic total: 5 ml Complexity: complex Blunt dissection to break up loculations Drainage: purulent Drainage amount: large Packing material: none Patient tolerance: Patient tolerated the procedure well with no immediate complications.     Labs Reviewed - No data to display No results found. 1. Abscess   2. Sebaceous cyst     MDM  Infected sebcesous cyst, drained at bedside. Dc home in good condition.   Lyanne Co, MD 10/06/12 [phone]

## 2012-10-06 NOTE — ED Notes (Signed)
Per pt sent here by doctor for abscess. Sts her doctor tried to drain yesterday but unable. Pt placed on abx. sts very painful and pt on blood thinners.

## 2012-10-06 NOTE — ED Notes (Signed)
Pt has abscess to upper medial back. errythmous and draining thick white drainage.

## 2012-10-06 NOTE — Telephone Encounter (Signed)
Pt calling in to make an appt next week after seeing the ER doctor for an infected seb.cyst. Pls call pt to schedule a f/u appt with Dr Biagio Quint.

## 2012-10-11 NOTE — Telephone Encounter (Signed)
Patient has follow up appointment for 10/12/12 w/Dr. Biagio Quint.

## 2012-10-12 ENCOUNTER — Ambulatory Visit (INDEPENDENT_AMBULATORY_CARE_PROVIDER_SITE_OTHER): Payer: Medicare Other | Admitting: General Surgery

## 2012-10-12 VITALS — BP 108/68 | HR 70 | Temp 97.0°F | Resp 16 | Wt 246.2 lb

## 2012-10-12 DIAGNOSIS — L03319 Cellulitis of trunk, unspecified: Secondary | ICD-10-CM

## 2012-10-12 DIAGNOSIS — L02219 Cutaneous abscess of trunk, unspecified: Secondary | ICD-10-CM

## 2012-10-12 DIAGNOSIS — L02212 Cutaneous abscess of back [any part, except buttock]: Secondary | ICD-10-CM

## 2012-10-12 NOTE — Progress Notes (Signed)
Subjective:     Patient ID: Kaitlyn Jackson, female   DOB: 10-04-1945, 67 y.o.   MRN: [id number]  HPI This patient follows up status post I&D of infected sebaceous cyst. They have not been packing the wound but have only been dressing it externally. He still has some purulent drainage but she says that the wound feels well other than some itching. She has no fevers or chills and has 2 days of Augmentin remaining. she stopped her blood thinners on her own accord  Review of Systems     Objective:   Physical Exam The wound looks fine and the erythema has improved. She has no significant tenderness. She has a small opening with a small amount of fibrinous exudate and purulent exudate and I repacked the wound today and instructed her husband in the wound care.    Assessment:     Back abscess-improving Her abscess looked much improved from our last visit. I repacked the wound and instructed her husband how to take care of of the wound. I recommended that he packed this with 1/4 inch Nu Gauze twice daily. Other than that I think this should heal fairly soon. I recommended that she finished her last few days of antibiotics also recommended that she call her primary doctor to find out how he wants her to restart her Coumadin. I have no problem with her resuming her Coumadin as far as the wound is concerned.     Plan:     Twice-daily wet-to-dry dressing changes Follow up in 3 weeks for repeat wound evaluation Touch base with the primary care physician for recommendations for restarting her Coumadin

## 2012-11-04 ENCOUNTER — Encounter (INDEPENDENT_AMBULATORY_CARE_PROVIDER_SITE_OTHER): Payer: Medicare Other | Admitting: General Surgery

## 2012-11-09 ENCOUNTER — Ambulatory Visit (INDEPENDENT_AMBULATORY_CARE_PROVIDER_SITE_OTHER): Payer: Medicare Other | Admitting: General Surgery

## 2012-11-09 ENCOUNTER — Encounter (INDEPENDENT_AMBULATORY_CARE_PROVIDER_SITE_OTHER): Payer: Self-pay | Admitting: General Surgery

## 2012-11-09 VITALS — BP 116/72 | HR 80 | Resp 16 | Ht 64.0 in | Wt 250.4 lb

## 2012-11-09 DIAGNOSIS — Z4889 Encounter for other specified surgical aftercare: Secondary | ICD-10-CM

## 2012-11-09 DIAGNOSIS — Z5189 Encounter for other specified aftercare: Secondary | ICD-10-CM

## 2012-11-09 NOTE — Progress Notes (Signed)
Subjective:     Patient ID: Kaitlyn Jackson, female   DOB: April 28, 1945, 67 y.o.   MRN: [id number]  HPI This patient follows up status post incision and drainage of back abscess. There is no longer any wound to pack and the wound is healing nicely. She has a small amount of drainage still but her main complaint is itching around the wound  Review of Systems     Objective:   Physical Exam The wound is healing nicely and really has less than a 1 cm wound remaining. This is very shallow and no longer needs any packing. She has some proud granulation tissue and applied some silver nitrate today. I redressed the wound. The skin around the area looks healthy and there is no evidence of any rash    Assessment:     Status post incision and drainage of back abscess-doing well She's healing nicely and I applied some silver nitrate to the wound today. I think that there really is not much wound care remaining other than to dress the wound.  I recommended that they continue covering the wound until it is completely healed. I think that this will be completely healed within the next 2 weeks. If there is still any drainage or remaining wound then she will call me at that time and we may need to reapply the silver nitrate to the granulation tissue I think that this should heal on its own.  Once this is completely healed, if there is a remaining cyst in the area, then we can plan for elective excision to prevent recurrence    Plan:     Continue local wound care followup in 2 weeks if not completely healed. As far as the itching is concerned, think this is normal wound healing and there does not appear to be any problems with the skin. She can use topical lotions as needed

## 2013-05-01 ENCOUNTER — Emergency Department (INDEPENDENT_AMBULATORY_CARE_PROVIDER_SITE_OTHER)
Admission: EM | Admit: 2013-05-01 | Discharge: 2013-05-01 | Disposition: A | Discharge status: Home / Self Care | Payer: Medicare Other | Source: Home / Self Care | Attending: Family Medicine | Admitting: Family Medicine

## 2013-05-01 ENCOUNTER — Encounter (HOSPITAL_COMMUNITY): Payer: Self-pay | Admitting: Emergency Medicine

## 2013-05-01 ENCOUNTER — Emergency Department (INDEPENDENT_AMBULATORY_CARE_PROVIDER_SITE_OTHER): Payer: Medicare Other

## 2013-05-01 DIAGNOSIS — M25519 Pain in unspecified shoulder: Secondary | ICD-10-CM

## 2013-05-01 DIAGNOSIS — M25511 Pain in right shoulder: Secondary | ICD-10-CM

## 2013-05-01 MED ORDER — HYDROCODONE-ACETAMINOPHEN 5-325 MG PO TABS: 2.0000 | ORAL_TABLET | ORAL | Status: DC | PRN

## 2013-05-01 NOTE — ED Provider Notes (Signed)
CSN: [id number]     Arrival date & time 05/01/13  1233 History   First MD Initiated Contact with Patient 05/01/13 1438     Chief Complaint  Patient presents with  . Shoulder Pain     (Consider location/radiation/quality/duration/timing/severity/associated sxs/prior Treatment) Patient is a 68 y.o. female presenting with shoulder pain. The history is provided by the patient. No language interpreter was used.  Shoulder Pain This is a new problem. The current episode started more than 1 week ago. The problem occurs constantly. The problem has been gradually worsening. Pertinent negatives include no chest pain. Nothing aggravates the symptoms. Nothing relieves the symptoms.    Past Medical History  Diagnosis Date  . Diabetes mellitus   . Arthritis   . Clotting disorder   . Heart murmur    Past Surgical History  Procedure Laterality Date  . 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 Cancer Mother     colon  . Cancer Cousin     lung   History  Substance Use Topics  . Smoking status: Never Smoker   . Smokeless tobacco: Not on file  . Alcohol Use: No   OB History   Grav Para Term Preterm Abortions TAB SAB Ect Mult Living                 Review of Systems  Cardiovascular: Negative for chest pain.  All other systems reviewed and are negative.      Allergies  Review of patient's allergies indicates no known allergies.  Home Medications   Current Outpatient Rx  Name  Route  Sig  Dispense  Refill  . amitriptyline (ELAVIL) 25 MG tablet   Oral   Take 25 mg by mouth at bedtime.         Marland Kitchen atorvastatin (LIPITOR) 10 MG tablet   Oral   Take 10 mg by mouth daily.         . benazepril (LOTENSIN) 40 MG tablet   Oral   Take 40 mg by mouth daily.         . Calcium Citrate-Vitamin D (CITRACAL + D PO)   Oral   Take 630 tablets by mouth.         . desonide (DESOWEN) 0.05 % lotion   Topical   Apply 1 application  topically 2 (two) times daily.         Marland Kitchen esomeprazole (NEXIUM) 40 MG capsule   Oral   Take 40 mg by mouth daily before breakfast.         . folic acid (FOLVITE) 1 MG tablet   Oral   Take 1 mg by mouth daily.         . furosemide (LASIX) 40 MG tablet   Oral   Take 40 mg by mouth daily.         . insulin aspart (NOVOLOG) 100 UNIT/ML injection   Subcutaneous   Inject into the skin 3 (three) times daily before meals.         . insulin NPH-insulin regular (NOVOLIN 70/30) (70-30) 100 UNIT/ML injection   Subcutaneous   Inject 40 Units into the skin daily with breakfast.         . insulin NPH-insulin regular (NOVOLIN 70/30) (70-30) 100 UNIT/ML injection   Subcutaneous   Inject 25 Units into the skin daily with supper.         . montelukast (SINGULAIR) 10 MG tablet  Oral   Take 10 mg by mouth at bedtime.         . Multiple Vitamins-Minerals (VITAMIN D3 COMPLETE PO)   Oral   Take 1,500 tablets by mouth.         . oxyCODONE (ROXICODONE) 5 MG immediate release tablet   Oral   Take 1 tablet (5 mg total) by mouth every 6 (six) hours as needed for pain.   20 tablet   0   . pregabalin (LYRICA) 100 MG capsule   Oral   Take 100 mg by mouth 2 (two) times daily.         . sitaGLIPtan-metformin (JANUMET) 50-1000 MG per tablet   Oral   Take 1 tablet by mouth 2 (two) times daily with a meal.         . Sulfacetamide Sodium (SODIUM SULFACETAMIDE EX)   Apply externally   Apply topically.         . verapamil (COVERA HS) 180 MG (CO) 24 hr tablet   Oral   Take 180 mg by mouth 2 (two) times daily.         Marland Kitchen warfarin (COUMADIN) 10 MG tablet   Oral   Take 10 mg by mouth 3 (three) times a week.         . warfarin (COUMADIN) 7.5 MG tablet   Oral   Take 7.5 mg by mouth 4 (four) times a week.          BP 160/80  Pulse 84  Temp(Src) 97 F (36.1 C) (Oral)  Resp 20  SpO2 95% Physical Exam  Nursing note and vitals reviewed. Constitutional: She appears  well-developed and well-nourished.  HENT:  Head: Normocephalic.  Cardiovascular: Normal rate.   Pulmonary/Chest: Effort normal.  Musculoskeletal: Normal range of motion.  Tender posterior shoulder and scapula, from,  nv and ns intact  Neurological: She is alert.  Skin: Skin is warm.  Psychiatric: She has a normal mood and affect.    ED Course  Procedures (including critical care time) Labs Review Labs Reviewed - No data to display Imaging Review Dg Shoulder Right  05/01/2013   CLINICAL DATA:  Right shoulder pain for 2 weeks  EXAM: RIGHT SHOULDER - 2+ VIEW  COMPARISON:  DG SHOULDER *R* dated 05/21/2011  FINDINGS: There is a 5 mm focus of calcification off of the greater tuberosity of the humerus without donor site. 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 There is minimal joint space narrowing of the glenohumeral joint with minimal osteophyte formation. There is mild degenerative change of the acromioclavicular joint.  IMPRESSION: Degenerative changes as described above, not significantly different from the prior study.   Electronically Signed   By: Skipper Cliche M.D.   On: 05/01/2013 15:25      MDM   Final diagnoses:  Shoulder pain, right    Hydrocodone.   Pt advised to see Dr. Carlis Abbott for recheck or Dr. Durward Fortes who she has seen in the past.    Fransico Meadow, PA-C 05/01/13 1733

## 2013-05-01 NOTE — Discharge Instructions (Signed)
   Shoulder Pain The shoulder is the joint that connects your arms to your body. The bones that form the shoulder joint include the upper arm bone (humerus), the shoulder blade (scapula), and the collarbone (clavicle). The top of the humerus is shaped like a ball and fits into a rather flat socket on the scapula (glenoid cavity). A combination of muscles and strong, fibrous tissues that connect muscles to bones (tendons) support your shoulder joint and hold the ball in the socket. Small, fluid-filled sacs (bursae) are located in different areas of the joint. They act as cushions between the bones and the overlying soft tissues and help reduce friction between the gliding tendons and the bone as you move your arm. Your shoulder joint allows a wide range of motion in your arm. This range of motion allows you to do things like scratch your back or throw a ball. However, this range of motion also makes your shoulder more prone to pain from overuse and injury. Causes of shoulder pain can originate from both injury and overuse and usually can be grouped in the following four categories:  Redness, swelling, and pain (inflammation) of the tendon (tendinitis) or the bursae (bursitis).  Instability, such as a dislocation of the joint.  Inflammation of the joint (arthritis).  Broken bone (fracture). HOME CARE INSTRUCTIONS   Apply ice to the sore area.  Put ice in a plastic bag.  Place a towel between your skin and the bag.  Leave the ice on for 15-20 minutes, 03-04 times per day for the first 2 days.  Stop using cold packs if they do not help with the pain.  If you have a shoulder sling or immobilizer, wear it as long as your caregiver instructs. Only remove it to shower or bathe. Move your arm as little as possible, but keep your hand moving to prevent swelling.  Squeeze a soft ball or foam pad as much as possible to help prevent swelling.  Only take over-the-counter or prescription medicines for  pain, discomfort, or fever as directed by your caregiver. SEEK MEDICAL CARE IF:   Your shoulder pain increases, or new pain develops in your arm, hand, or fingers.  Your hand or fingers become cold and numb.  Your pain is not relieved with medicines. SEEK IMMEDIATE MEDICAL CARE IF:   Your arm, hand, or fingers are numb or tingling.  Your arm, hand, or fingers are significantly swollen or turn white or blue. MAKE SURE YOU:   Understand these instructions.  Will watch your condition.  Will get help right away if you are not doing well or get worse. Document Released: 12/10/2004 Document Revised: 11/25/2011 Document Reviewed: 02/14/2011 [employer] Patient Information 2014 [employer].  

## 2013-05-01 NOTE — ED Notes (Signed)
Pt  Reports    Pain  r  Shoulder       With  No  Injury       Pt         Reports  The  Symptoms   For  Several  Weeks         Pt  Reports     Symptoms  Not  releived    By        Naproxen         Pt  Reports  Pain is  Worse  On  Movement  And  Certain posistions

## 2013-05-05 NOTE — ED Provider Notes (Signed)
Medical screening examination/treatment/procedure(s) were performed by a resident physician or non-physician practitioner and as the supervising physician I was immediately available for consultation/collaboration.  Lynne Leader, MD    Gregor Hams, MD 05/05/13 [phone]

## 2013-11-16 ENCOUNTER — Emergency Department (HOSPITAL_COMMUNITY)
Admission: EM | Admit: 2013-11-16 | Discharge: 2013-11-17 | Disposition: A | Discharge status: Home / Self Care | Payer: Medicare Other | Attending: Emergency Medicine | Admitting: Emergency Medicine

## 2013-11-16 ENCOUNTER — Encounter (HOSPITAL_COMMUNITY): Payer: Self-pay | Admitting: Emergency Medicine

## 2013-11-16 ENCOUNTER — Emergency Department (HOSPITAL_COMMUNITY): Payer: Medicare Other

## 2013-11-16 DIAGNOSIS — Z792 Long term (current) use of antibiotics: Secondary | ICD-10-CM | POA: Insufficient documentation

## 2013-11-16 DIAGNOSIS — Z7901 Long term (current) use of anticoagulants: Secondary | ICD-10-CM | POA: Diagnosis not present

## 2013-11-16 DIAGNOSIS — Z794 Long term (current) use of insulin: Secondary | ICD-10-CM | POA: Diagnosis not present

## 2013-11-16 DIAGNOSIS — M129 Arthropathy, unspecified: Secondary | ICD-10-CM | POA: Insufficient documentation

## 2013-11-16 DIAGNOSIS — Z791 Long term (current) use of non-steroidal anti-inflammatories (NSAID): Secondary | ICD-10-CM | POA: Insufficient documentation

## 2013-11-16 DIAGNOSIS — N390 Urinary tract infection, site not specified: Secondary | ICD-10-CM | POA: Diagnosis not present

## 2013-11-16 DIAGNOSIS — IMO0002 Reserved for concepts with insufficient information to code with codable children: Secondary | ICD-10-CM | POA: Insufficient documentation

## 2013-11-16 DIAGNOSIS — Z79899 Other long term (current) drug therapy: Secondary | ICD-10-CM | POA: Insufficient documentation

## 2013-11-16 DIAGNOSIS — R109 Unspecified abdominal pain: Secondary | ICD-10-CM | POA: Diagnosis not present

## 2013-11-16 DIAGNOSIS — R011 Cardiac murmur, unspecified: Secondary | ICD-10-CM | POA: Insufficient documentation

## 2013-11-16 DIAGNOSIS — E119 Type 2 diabetes mellitus without complications: Secondary | ICD-10-CM | POA: Diagnosis not present

## 2013-11-16 DIAGNOSIS — Z862 Personal history of diseases of the blood and blood-forming organs and certain disorders involving the immune mechanism: Secondary | ICD-10-CM | POA: Diagnosis not present

## 2013-11-16 DIAGNOSIS — R10A0 Flank pain, unspecified side: Secondary | ICD-10-CM

## 2013-11-16 LAB — URINALYSIS, ROUTINE W REFLEX MICROSCOPIC
BILIRUBIN URINE: NEGATIVE
GLUCOSE, UA: NEGATIVE mg/dL
HGB URINE DIPSTICK: NEGATIVE
KETONES UR: NEGATIVE mg/dL
Nitrite: NEGATIVE
PROTEIN: NEGATIVE mg/dL
Specific Gravity, Urine: 1.017 (ref 1.005–1.030)
Urobilinogen, UA: 0.2 mg/dL (ref 0.0–1.0)
pH: 5 (ref 5.0–8.0)

## 2013-11-16 LAB — COMPREHENSIVE METABOLIC PANEL
ALK PHOS: 110 U/L (ref 39–117)
ALT: 15 U/L (ref 0–35)
AST: 17 U/L (ref 0–37)
Albumin: 3.3 g/dL — ABNORMAL LOW (ref 3.5–5.2)
Anion gap: 18 — ABNORMAL HIGH (ref 5–15)
BUN: 21 mg/dL (ref 6–23)
CHLORIDE: 97 meq/L (ref 96–112)
CO2: 21 meq/L (ref 19–32)
CREATININE: 1.08 mg/dL (ref 0.50–1.10)
Calcium: 9 mg/dL (ref 8.4–10.5)
GFR calc Af Amer: 59 mL/min — ABNORMAL LOW (ref >90)
GFR, EST NON AFRICAN AMERICAN: 51 mL/min — AB (ref >90)
Glucose, Bld: 141 mg/dL — ABNORMAL HIGH (ref 70–99)
POTASSIUM: 4.8 meq/L (ref 3.7–5.3)
SODIUM: 136 meq/L — AB (ref 137–147)
Total Protein: 7 g/dL (ref 6.0–8.3)

## 2013-11-16 LAB — URINE MICROSCOPIC-ADD ON

## 2013-11-16 LAB — CBC WITH DIFFERENTIAL/PLATELET
Basophils Absolute: 0 10*3/uL (ref 0.0–0.1)
Basophils Relative: 0 % (ref 0–1)
EOS PCT: 1 % (ref 0–5)
Eosinophils Absolute: 0.1 10*3/uL (ref 0.0–0.7)
HCT: 39.3 % (ref 36.0–46.0)
Hemoglobin: 12.8 g/dL (ref 12.0–15.0)
LYMPHS ABS: 2.1 10*3/uL (ref 0.7–4.0)
Lymphocytes Relative: 19 % (ref 12–46)
MCH: 28.4 pg (ref 26.0–34.0)
MCHC: 32.6 g/dL (ref 30.0–36.0)
MCV: 87.3 fL (ref 78.0–100.0)
MONOS PCT: 7 % (ref 3–12)
Monocytes Absolute: 0.8 10*3/uL (ref 0.1–1.0)
Neutro Abs: 8.1 10*3/uL — ABNORMAL HIGH (ref 1.7–7.7)
Neutrophils Relative %: 73 % (ref 43–77)
PLATELETS: 370 10*3/uL (ref 150–400)
RBC: 4.5 MIL/uL (ref 3.87–5.11)
RDW: 15.6 % — ABNORMAL HIGH (ref 11.5–15.5)
WBC: 11.1 10*3/uL — AB (ref 4.0–10.5)

## 2013-11-16 LAB — CBG MONITORING, ED: GLUCOSE-CAPILLARY: 182 mg/dL — AB (ref 70–99)

## 2013-11-16 MED ORDER — IOHEXOL 300 MG/ML  SOLN
100.0000 mL | Freq: Once | INTRAMUSCULAR | Status: AC | PRN
  Administered 2013-11-16: 100 mL via INTRAVENOUS

## 2013-11-16 MED ORDER — NAPROXEN 500 MG PO TABS: 500.0000 mg | ORAL_TABLET | Freq: Two times a day (BID) | ORAL | Status: DC

## 2013-11-16 MED ORDER — KETOROLAC TROMETHAMINE 15 MG/ML IJ SOLN
15.0000 mg | Freq: Once | INTRAMUSCULAR | Status: AC
  Administered 2013-11-16: 15 mg via INTRAVENOUS
  Filled 2013-11-16: qty 1

## 2013-11-16 MED ORDER — DEXTROSE 5 % IV SOLN
1.0000 g | Freq: Once | INTRAVENOUS | Status: AC
  Administered 2013-11-16: 1 g via INTRAVENOUS
  Filled 2013-11-16: qty 10

## 2013-11-16 MED ORDER — CEPHALEXIN 500 MG PO CAPS: 500.0000 mg | ORAL_CAPSULE | Freq: Four times a day (QID) | ORAL | Status: DC

## 2013-11-16 NOTE — ED Notes (Signed)
Notified RN,Lauren,[place] pt. CBG 182.

## 2013-11-16 NOTE — Discharge Instructions (Signed)
Please call your doctor for a followup appointment within 24-48 hours. When you talk to your doctor please let them know that you were seen in the emergency department and have them acquire all of your records so that they can discuss the findings with you and formulate a treatment plan to fully care for your new and ongoing problems. ° °

## 2013-11-16 NOTE — ED Notes (Signed)
Patient transported to CT 

## 2013-11-16 NOTE — ED Provider Notes (Signed)
CSN: [id number]     Arrival date & time 11/16/13  1639 History   First MD Initiated Contact with Patient 11/16/13 [place]     Chief Complaint  Patient presents with  . Flank Pain     (Consider location/radiation/quality/duration/timing/severity/associated sxs/prior Treatment) HPI Comments: 68 year old female, history of hypertension, diabetes presents with a complaint of left flank pain which has been going on for over one week, she has been seen at her family doctor who gave her amitriptyline, has had some improvement but has persistent pain which seemed to worsen last night.   this is worsened with standing, better with sitting, worse with laying on right side, better with left-sided, no associated diarrhea, constipation, rectal bleeding, dysuria, hematuria, cough or shortness of breath. She denies abdominal pain but does have a history of diverticulitis. She was told to come to the hospital if her pain continued for x-rays.  Patient is a 68 y.o. female presenting with flank pain. The history is provided by the patient.  Flank Pain    Past Medical History  Diagnosis Date  . Diabetes mellitus   . Arthritis   . Clotting disorder   . Heart murmur    Past Surgical History  Procedure Laterality Date  . 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 Cancer Mother     colon  . Cancer Cousin     lung   History  Substance Use Topics  . Smoking status: Never Smoker   . Smokeless tobacco: Not on file  . Alcohol Use: No   OB History   Grav Para Term Preterm Abortions TAB SAB Ect Mult Living                 Review of Systems  Genitourinary: Positive for flank pain.  All other systems reviewed and are negative.     Allergies  Review of patient's allergies indicates no known allergies.  Home Medications   Prior to Admission medications   Medication Sig Start Date End Date Taking? Authorizing Provider  acetaminophen (TYLENOL) 500 MG  tablet Take 500 mg by mouth every 6 (six) hours as needed for moderate pain.   Yes Historical Provider, MD  atorvastatin (LIPITOR) 10 MG tablet Take 10 mg by mouth daily.   Yes Historical Provider, MD  benazepril (LOTENSIN) 40 MG tablet Take 40 mg by mouth daily.   Yes Historical Provider, MD  Calcium Citrate-Vitamin D (CITRACAL + D PO) Take 630 tablets by mouth.   Yes Historical Provider, MD  desonide (DESOWEN) 0.05 % lotion Apply 1 application topically 2 (two) times daily.   Yes Historical Provider, MD  esomeprazole (NEXIUM) 40 MG capsule Take 40 mg by mouth daily before breakfast.   Yes Historical Provider, MD  folic acid (FOLVITE) 1 MG tablet Take 1 mg by mouth daily.   Yes Historical Provider, MD  furosemide (LASIX) 40 MG tablet Take 40 mg by mouth daily.   Yes Historical Provider, MD  insulin NPH-insulin regular (NOVOLIN 70/30) (70-30) 100 UNIT/ML injection Inject 35 Units into the skin daily with breakfast.    Yes Historical Provider, MD  insulin NPH-insulin regular (NOVOLIN 70/30) (70-30) 100 UNIT/ML injection Inject 12 Units into the skin daily with supper.    Yes Historical Provider, MD  lubiprostone (AMITIZA) 8 MCG capsule Take 8 mcg by mouth 2 (two) times daily with a meal.   Yes Historical Provider, MD  montelukast (SINGULAIR) 10 MG tablet Take 10 mg  by mouth at bedtime.   Yes Historical Provider, MD  Multiple Vitamins-Minerals (VITAMIN D3 COMPLETE PO) Take 1,500 tablets by mouth.   Yes Historical Provider, MD  naproxen (NAPROSYN) 375 MG tablet Take 375 mg by mouth daily.   Yes Historical Provider, MD  pregabalin (LYRICA) 100 MG capsule Take 100 mg by mouth 2 (two) times daily.   Yes Historical Provider, MD  sitaGLIPtan-metformin (JANUMET) 50-1000 MG per tablet Take 1 tablet by mouth 2 (two) times daily with a meal.   Yes Historical Provider, MD  Sulfacetamide Sodium (SODIUM SULFACETAMIDE EX) Apply topically.   Yes Historical Provider, MD  verapamil (COVERA HS) 180 MG (CO) 24 hr tablet  Take 180 mg by mouth 2 (two) times daily.   Yes Historical Provider, MD  warfarin (COUMADIN) 10 MG tablet Take 10 mg by mouth 3 (three) times a week.   Yes Historical Provider, MD  warfarin (COUMADIN) 7.5 MG tablet Take 7.5 mg by mouth 4 (four) times a week.   Yes Historical Provider, MD  cephALEXin (KEFLEX) 500 MG capsule Take 1 capsule (500 mg total) by mouth 4 (four) times daily. 11/16/13   Johnna Acosta, MD  naproxen (NAPROSYN) 500 MG tablet Take 1 tablet (500 mg total) by mouth 2 (two) times daily with a meal. 11/16/13   Johnna Acosta, MD   BP 146/98  Pulse 70  Temp(Src) 97.8 F (36.6 C) (Oral)  Resp 18  SpO2 100% Physical Exam  Nursing note and vitals reviewed. Constitutional: She appears well-developed and well-nourished. No distress.  HENT:  Head: Normocephalic and atraumatic.  Mouth/Throat: Oropharynx is clear and moist. No oropharyngeal exudate.  Eyes: Conjunctivae and EOM are normal. Pupils are equal, round, and reactive to light. Right eye exhibits no discharge. Left eye exhibits no discharge. No scleral icterus.  Neck: Normal range of motion. Neck supple. No JVD present. No thyromegaly present.  Cardiovascular: Normal rate, regular rhythm, normal heart sounds and intact distal pulses.  Exam reveals no gallop and no friction rub.   No murmur heard. Pulmonary/Chest: Effort normal and breath sounds normal. No respiratory distress. She has no wheezes. She has no rales.  Abdominal: Soft. Bowel sounds are normal. She exhibits no distension and no mass. There is no tenderness.  Obese, no abdominal tenderness or guarding  Musculoskeletal: Normal range of motion. She exhibits tenderness ( Mild tenderness to palpation across the left back, sid and flank). She exhibits no edema.  Lymphadenopathy:    She has no cervical adenopathy.  Neurological: She is alert. Coordination normal.  Skin: Skin is warm and dry. No rash noted. No erythema.  Psychiatric: She has a normal mood and affect. Her  behavior is normal.    ED Course  Procedures (including critical care time) Labs Review Labs Reviewed  CBC WITH DIFFERENTIAL - Abnormal; Notable for the following:    WBC 11.1 (*)    RDW 15.6 (*)    Neutro Abs 8.1 (*)    All other components within normal limits  COMPREHENSIVE METABOLIC PANEL - Abnormal; Notable for the following:    Sodium 136 (*)    Glucose, Bld 141 (*)    Albumin 3.3 (*)    Total Bilirubin <0.2 (*)    GFR calc non Af Amer 51 (*)    GFR calc Af Amer 59 (*)    Anion gap 18 (*)    All other components within normal limits  URINALYSIS, ROUTINE W REFLEX MICROSCOPIC - Abnormal; Notable for the following:  APPearance CLOUDY (*)    Leukocytes, UA LARGE (*)    All other components within normal limits  URINE MICROSCOPIC-ADD ON - Abnormal; Notable for the following:    Squamous Epithelial / LPF FEW (*)    Bacteria, UA FEW (*)    Casts HYALINE CASTS (*)    All other components within normal limits  CBG MONITORING, ED - Abnormal; Notable for the following:    Glucose-Capillary 182 (*)    All other components within normal limits  URINE CULTURE    Imaging Review Ct Abdomen Pelvis W Contrast  11/16/2013   CLINICAL DATA:  Left flank pain  EXAM: CT ABDOMEN AND PELVIS WITH CONTRAST  TECHNIQUE: Multidetector CT imaging of the abdomen and pelvis was performed using the standard protocol following bolus administration of intravenous contrast.  CONTRAST:  175mL OMNIPAQUE IOHEXOL 300 MG/ML  SOLN  COMPARISON:  02/16/2004  FINDINGS: Lung bases clear.  Normal heart size to mildly enlarged.  Decreased hepatic attenuation is nonspecific post contrast however can be seen in the setting of fatty infiltration.  No radiodense gallstones or biliary ductal dilatation.  No appreciable abnormality of the spleen and pancreas. No adrenal nodule.  Symmetric renal enhancement.  No hydroureteronephrosis.  Colonic diverticulosis. No CT evidence for colitis or diverticulitis. Normal appendix. Small  bowel loops are of normal course and caliber. No free intraperitoneal air no lymphadenopathy.  Normal caliber aorta and branch vessels with mild scattered atherosclerotic disease.  Thin walled bladder. Nonspecific appearance to the uterus with underlying fibroids suspected. 13 mm right ovarian cyst is statistically incidental and similar in appearance to prior.  Multilevel degenerative change. 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 Sequelae of prior right lower lumbar laminectomy No acute osseous finding.  IMPRESSION: Colonic diverticulosis.  No CT evidence for diverticulitis.  Hepatic steatosis is questioned.   Electronically Signed   By: Carlos Levering M.D.   On: 11/16/2013 23:30      MDM   Final diagnoses:  UTI (lower urinary tract infection)  Flank pain    Labs show slight leukocytosis, no other significant findings, urinalysis and CT imaging pending. Patient states pain is 2/10 and declined pain medicine at this time  Improved with meds  CT shows no diverticultitis or other acute findings -  Rocephin given for UTI  VS unremarkable.  Meds given in ED:  Medications  ketorolac (TORADOL) 15 MG/ML injection 15 mg (not administered)  cefTRIAXone (ROCEPHIN) 1 g in dextrose 5 % 50 mL IVPB (1 g Intravenous New Bag/Given 11/16/13 2132)  iohexol (OMNIPAQUE) 300 MG/ML solution 100 mL (100 mLs Intravenous Contrast Given 11/16/13 2228)    New Prescriptions   CEPHALEXIN (KEFLEX) 500 MG CAPSULE    Take 1 capsule (500 mg total) by mouth 4 (four) times daily.   NAPROXEN (NAPROSYN) 500 MG TABLET    Take 1 tablet (500 mg total) by mouth 2 (two) times daily with a meal.      Johnna Acosta, MD 11/16/13 2347

## 2013-11-16 NOTE — ED Notes (Signed)
Pt presents with c/o left flank pain for over a month. Pt reports that she has seen a doctor for the pain and has been prescribed some medication. Pt reports that she does have urinary frequency but she does take Lasix sometimes. Reports that she does not always take her Lasix as prescribed. Denies any blood in her urine or burning with urination.

## 2013-11-17 DIAGNOSIS — R109 Unspecified abdominal pain: Secondary | ICD-10-CM | POA: Diagnosis not present

## 2013-11-18 LAB — URINE CULTURE
Colony Count: 75000
Special Requests: NORMAL

## 2013-11-19 ENCOUNTER — Telehealth (HOSPITAL_BASED_OUTPATIENT_CLINIC_OR_DEPARTMENT_OTHER): Payer: Self-pay | Admitting: Emergency Medicine

## 2013-11-19 NOTE — Telephone Encounter (Signed)
Post ED Visit - Positive Culture Follow-up  Culture report reviewed by antimicrobial stewardship pharmacist: [x]  Wes Marlin, Pharm.D., BCPS []  Heide Guile, Pharm.D., BCPS []  Alycia Rossetti, Pharm.D., BCPS []  [place], Pharm.D., BCPS, AAHIVP []  Legrand Como, Pharm.D., BCPS, AAHIVP []  Carly Sabat, Pharm.D. []  Elenor Quinones, Pharm.D.  Positive urine culture Treated with Keflex, organism sensitive to the same and no further patient follow-up is required at this time.  Myrna Blazer 11/19/2013, 4:49 PM

## 2013-11-30 ENCOUNTER — Other Ambulatory Visit: Payer: Self-pay | Admitting: Orthopedic Surgery

## 2013-12-08 ENCOUNTER — Encounter (HOSPITAL_BASED_OUTPATIENT_CLINIC_OR_DEPARTMENT_OTHER): Payer: Self-pay | Admitting: *Deleted

## 2013-12-08 NOTE — Progress Notes (Addendum)
Bring all medications and CPAP machine. Coming Wednesday  for BMET,EKG, Pt and PTT

## 2013-12-13 ENCOUNTER — Encounter (HOSPITAL_BASED_OUTPATIENT_CLINIC_OR_DEPARTMENT_OTHER)
Admission: RE | Admit: 2013-12-13 | Discharge: 2013-12-13 | Disposition: A | Discharge status: Home / Self Care | Payer: Medicare Other | Source: Ambulatory Visit | Attending: Orthopedic Surgery | Admitting: Orthopedic Surgery

## 2013-12-13 ENCOUNTER — Other Ambulatory Visit: Payer: Self-pay

## 2013-12-13 ENCOUNTER — Encounter (HOSPITAL_BASED_OUTPATIENT_CLINIC_OR_DEPARTMENT_OTHER): Payer: Self-pay | Admitting: *Deleted

## 2013-12-13 DIAGNOSIS — E119 Type 2 diabetes mellitus without complications: Secondary | ICD-10-CM | POA: Diagnosis not present

## 2013-12-13 DIAGNOSIS — R011 Cardiac murmur, unspecified: Secondary | ICD-10-CM | POA: Diagnosis not present

## 2013-12-13 DIAGNOSIS — G4730 Sleep apnea, unspecified: Secondary | ICD-10-CM | POA: Insufficient documentation

## 2013-12-13 DIAGNOSIS — I10 Essential (primary) hypertension: Secondary | ICD-10-CM | POA: Insufficient documentation

## 2013-12-13 DIAGNOSIS — M1990 Unspecified osteoarthritis, unspecified site: Secondary | ICD-10-CM | POA: Insufficient documentation

## 2013-12-13 DIAGNOSIS — Z6841 Body Mass Index (BMI) 40.0 and over, adult: Secondary | ICD-10-CM | POA: Diagnosis not present

## 2013-12-13 DIAGNOSIS — M65841 Other synovitis and tenosynovitis, right hand: Secondary | ICD-10-CM | POA: Insufficient documentation

## 2013-12-13 DIAGNOSIS — I341 Nonrheumatic mitral (valve) prolapse: Secondary | ICD-10-CM | POA: Insufficient documentation

## 2013-12-13 DIAGNOSIS — M129 Arthropathy, unspecified: Secondary | ICD-10-CM | POA: Diagnosis not present

## 2013-12-13 DIAGNOSIS — K219 Gastro-esophageal reflux disease without esophagitis: Secondary | ICD-10-CM | POA: Diagnosis not present

## 2013-12-13 DIAGNOSIS — M6530 Trigger finger, unspecified finger: Secondary | ICD-10-CM | POA: Diagnosis present

## 2013-12-13 DIAGNOSIS — I059 Rheumatic mitral valve disease, unspecified: Secondary | ICD-10-CM | POA: Diagnosis not present

## 2013-12-13 LAB — APTT: APTT: 33 s (ref 24–37)

## 2013-12-13 LAB — PROTIME-INR
INR: 1.17 (ref 0.00–1.49)
PROTHROMBIN TIME: 14.9 s (ref 11.6–15.2)

## 2013-12-14 ENCOUNTER — Encounter (HOSPITAL_BASED_OUTPATIENT_CLINIC_OR_DEPARTMENT_OTHER)
Admission: RE | Disposition: A | Discharge status: Home / Self Care | Payer: Self-pay | Source: Ambulatory Visit | Attending: Orthopedic Surgery

## 2013-12-14 ENCOUNTER — Ambulatory Visit (HOSPITAL_BASED_OUTPATIENT_CLINIC_OR_DEPARTMENT_OTHER): Payer: Medicare Other | Admitting: Anesthesiology

## 2013-12-14 ENCOUNTER — Encounter (HOSPITAL_BASED_OUTPATIENT_CLINIC_OR_DEPARTMENT_OTHER): Payer: Medicare Other | Admitting: Anesthesiology

## 2013-12-14 ENCOUNTER — Ambulatory Visit (HOSPITAL_BASED_OUTPATIENT_CLINIC_OR_DEPARTMENT_OTHER)
Admission: RE | Admit: 2013-12-14 | Discharge: 2013-12-14 | Disposition: A | Discharge status: Home / Self Care | Payer: Medicare Other | Source: Ambulatory Visit | Attending: Orthopedic Surgery | Admitting: Orthopedic Surgery

## 2013-12-14 ENCOUNTER — Encounter (HOSPITAL_BASED_OUTPATIENT_CLINIC_OR_DEPARTMENT_OTHER): Payer: Self-pay | Admitting: Anesthesiology

## 2013-12-14 DIAGNOSIS — M1990 Unspecified osteoarthritis, unspecified site: Secondary | ICD-10-CM | POA: Diagnosis not present

## 2013-12-14 DIAGNOSIS — I341 Nonrheumatic mitral (valve) prolapse: Secondary | ICD-10-CM | POA: Diagnosis not present

## 2013-12-14 DIAGNOSIS — E119 Type 2 diabetes mellitus without complications: Secondary | ICD-10-CM | POA: Diagnosis not present

## 2013-12-14 DIAGNOSIS — I10 Essential (primary) hypertension: Secondary | ICD-10-CM | POA: Diagnosis not present

## 2013-12-14 DIAGNOSIS — M65841 Other synovitis and tenosynovitis, right hand: Secondary | ICD-10-CM | POA: Diagnosis present

## 2013-12-14 DIAGNOSIS — G4730 Sleep apnea, unspecified: Secondary | ICD-10-CM | POA: Diagnosis not present

## 2013-12-14 DIAGNOSIS — K219 Gastro-esophageal reflux disease without esophagitis: Secondary | ICD-10-CM | POA: Diagnosis not present

## 2013-12-14 DIAGNOSIS — R011 Cardiac murmur, unspecified: Secondary | ICD-10-CM | POA: Diagnosis not present

## 2013-12-14 HISTORY — DX: Sleep apnea, unspecified: G47.30

## 2013-12-14 HISTORY — DX: Gastro-esophageal reflux disease without esophagitis: K21.9

## 2013-12-14 HISTORY — DX: Nonrheumatic mitral (valve) prolapse: I34.1

## 2013-12-14 HISTORY — DX: Essential (primary) hypertension: I10

## 2013-12-14 HISTORY — PX: TRIGGER FINGER RELEASE: SHX641

## 2013-12-14 LAB — POCT I-STAT, CHEM 8
BUN: 18 mg/dL (ref 6–23)
Calcium, Ion: 1.17 mmol/L (ref 1.13–1.30)
Chloride: 102 mEq/L (ref 96–112)
Creatinine, Ser: 1 mg/dL (ref 0.50–1.10)
Glucose, Bld: 203 mg/dL — ABNORMAL HIGH (ref 70–99)
HCT: 41 % (ref 36.0–46.0)
Hemoglobin: 13.9 g/dL (ref 12.0–15.0)
Potassium: 4.5 mEq/L (ref 3.7–5.3)
Sodium: 139 mEq/L (ref 137–147)
TCO2: 25 mmol/L (ref 0–100)

## 2013-12-14 LAB — GLUCOSE, CAPILLARY: GLUCOSE-CAPILLARY: 174 mg/dL — AB (ref 70–99)

## 2013-12-14 SURGERY — RELEASE, A1 PULLEY, FOR TRIGGER FINGER
Anesthesia: Monitor Anesthesia Care | Site: Hand | Laterality: Right

## 2013-12-14 MED ORDER — PROPOFOL INFUSION 10 MG/ML OPTIME
INTRAVENOUS | Status: DC | PRN
  Administered 2013-12-14: 25 ug/kg/min via INTRAVENOUS

## 2013-12-14 MED ORDER — CEFAZOLIN SODIUM-DEXTROSE 2-3 GM-% IV SOLR
INTRAVENOUS | Status: DC | PRN
  Administered 2013-12-14: 2 g via INTRAVENOUS

## 2013-12-14 MED ORDER — LACTATED RINGERS IV SOLN
INTRAVENOUS | Status: DC
  Administered 2013-12-14: 09:00:00 via INTRAVENOUS

## 2013-12-14 MED ORDER — CEFAZOLIN SODIUM-DEXTROSE 2-3 GM-% IV SOLR: 2.0000 g | INTRAVENOUS | Status: DC

## 2013-12-14 MED ORDER — MIDAZOLAM HCL 2 MG/2ML IJ SOLN: 1.0000 mg | INTRAMUSCULAR | Status: DC | PRN

## 2013-12-14 MED ORDER — CHLORHEXIDINE GLUCONATE 4 % EX LIQD: 60.0000 mL | Freq: Once | CUTANEOUS | Status: DC

## 2013-12-14 MED ORDER — BUPIVACAINE HCL (PF) 0.25 % IJ SOLN
INTRAMUSCULAR | Status: DC | PRN
  Administered 2013-12-14: 7 mL

## 2013-12-14 MED ORDER — FENTANYL CITRATE 0.05 MG/ML IJ SOLN: 50.0000 ug | INTRAMUSCULAR | Status: DC | PRN

## 2013-12-14 MED ORDER — MIDAZOLAM HCL 2 MG/2ML IJ SOLN
INTRAMUSCULAR | Status: AC
  Filled 2013-12-14: qty 2

## 2013-12-14 MED ORDER — FENTANYL CITRATE 0.05 MG/ML IJ SOLN
INTRAMUSCULAR | Status: DC | PRN
  Administered 2013-12-14: 25 ug via INTRAVENOUS

## 2013-12-14 MED ORDER — ONDANSETRON HCL 4 MG/2ML IJ SOLN
INTRAMUSCULAR | Status: DC | PRN
  Administered 2013-12-14: 4 mg via INTRAVENOUS

## 2013-12-14 MED ORDER — HYDROCODONE-ACETAMINOPHEN 5-325 MG PO TABS: 1.0000 | ORAL_TABLET | Freq: Four times a day (QID) | ORAL | Status: DC | PRN

## 2013-12-14 MED ORDER — FENTANYL CITRATE 0.05 MG/ML IJ SOLN
INTRAMUSCULAR | Status: AC
  Filled 2013-12-14: qty 6

## 2013-12-14 MED ORDER — CEFAZOLIN SODIUM-DEXTROSE 2-3 GM-% IV SOLR
INTRAVENOUS | Status: AC
Start: 2013-12-14 — End: 2013-12-14
  Filled 2013-12-14: qty 50

## 2013-12-14 SURGICAL SUPPLY — 37 items
BANDAGE COBAN STERILE 2 (GAUZE/BANDAGES/DRESSINGS) ×3 IMPLANT
BLADE MINI RND TIP GREEN BEAV (BLADE) ×3 IMPLANT
BLADE SURG 15 STRL LF DISP TIS (BLADE) ×1 IMPLANT
BLADE SURG 15 STRL SS (BLADE) ×3
BNDG CMPR 9X4 STRL LF SNTH (GAUZE/BANDAGES/DRESSINGS)
BNDG ESMARK 4X9 LF (GAUZE/BANDAGES/DRESSINGS) IMPLANT
CHLORAPREP W/TINT 26ML (MISCELLANEOUS) ×3 IMPLANT
CORDS BIPOLAR (ELECTRODE) ×4 IMPLANT
COVER MAYO STAND STRL (DRAPES) ×3 IMPLANT
COVER TABLE BACK 60X90 (DRAPES) ×3 IMPLANT
CUFF TOURNIQUET SINGLE 18IN (TOURNIQUET CUFF) ×2 IMPLANT
DECANTER SPIKE VIAL GLASS SM (MISCELLANEOUS) IMPLANT
DRAPE EXTREMITY TIBURON (DRAPES) ×3 IMPLANT
DRAPE SURG 17X23 STRL (DRAPES) ×3 IMPLANT
GAUZE SPONGE 4X4 12PLY STRL (GAUZE/BANDAGES/DRESSINGS) ×3 IMPLANT
GAUZE XEROFORM 1X8 LF (GAUZE/BANDAGES/DRESSINGS) ×3 IMPLANT
GLOVE BIO SURGEON STRL SZ 6.5 (GLOVE) ×1 IMPLANT
GLOVE BIO SURGEONS STRL SZ 6.5 (GLOVE) ×1
GLOVE BIOGEL PI IND STRL 7.0 (GLOVE) IMPLANT
GLOVE BIOGEL PI IND STRL 8.5 (GLOVE) ×1 IMPLANT
GLOVE BIOGEL PI INDICATOR 7.0 (GLOVE) ×2
GLOVE BIOGEL PI INDICATOR 8.5 (GLOVE) ×2
GLOVE SURG ORTHO 8.0 STRL STRW (GLOVE) ×3 IMPLANT
GOWN STRL REUS W/ TWL LRG LVL3 (GOWN DISPOSABLE) ×1 IMPLANT
GOWN STRL REUS W/TWL LRG LVL3 (GOWN DISPOSABLE) ×3
GOWN STRL REUS W/TWL XL LVL3 (GOWN DISPOSABLE) ×3 IMPLANT
NEEDLE 27GAX1X1/2 (NEEDLE) ×3 IMPLANT
NS IRRIG 1000ML POUR BTL (IV SOLUTION) ×3 IMPLANT
PACK BASIN DAY SURGERY FS (CUSTOM PROCEDURE TRAY) ×3 IMPLANT
PADDING CAST ABS 4INX4YD NS (CAST SUPPLIES) ×2
PADDING CAST ABS COTTON 4X4 ST (CAST SUPPLIES) ×1 IMPLANT
STOCKINETTE 4X48 STRL (DRAPES) ×3 IMPLANT
SUT VICRYL RAPIDE 4/0 PS 2 (SUTURE) ×3 IMPLANT
SYR BULB 3OZ (MISCELLANEOUS) ×3 IMPLANT
SYR CONTROL 10ML LL (SYRINGE) ×3 IMPLANT
TOWEL OR 17X24 6PK STRL BLUE (TOWEL DISPOSABLE) ×6 IMPLANT
UNDERPAD 30X30 INCONTINENT (UNDERPADS AND DIAPERS) ×3 IMPLANT

## 2013-12-14 NOTE — Op Note (Signed)
NAMEJEFFRIE, Kaitlyn Jackson            ACCOUNT NO.:  [id number]  MEDICAL RECORD NO.:  [id number]  LOCATION:                                 FACILITY:  PHYSICIAN:  Daryll Brod, M.D.       DATE OF BIRTH:  Jan 08, 1946  DATE OF PROCEDURE:  12/14/2013 DATE OF DISCHARGE:  12/14/2013                              OPERATIVE REPORT   PREOPERATIVE DIAGNOSIS:  Stenosing tenosynovitis, right ring finger.  POSTOPERATIVE DIAGNOSIS:  Stenosing tenosynovitis, right ring finger.  OPERATION:  Release of A1 pulley.  Partial tenosynovectomy of flexor tendons, right ring finger.  SURGEON:  Daryll Brod, MD.  ANESTHESIA:  Forearm-based IV regional with local infiltration.  ANESTHESIOLOGIST:  Crew[place].  HISTORY:  The patient is a 68 year old female with a history of triggering of her right ring finger.  This has persisted despite 2 injections as conservative treatment.  She has elected to undergo surgical release of the A1 pulley.  Pre, peri, and postoperative course have been discussed along with risks and complications.  She is aware that there is no guarantee with the surgery;  possibility of infection; recurrence of injury to arteries, nerves, tendons; incomplete relief of symptoms;  dystrophy.  In the preoperative area, the patient is seen, the extremity marked by both patient and surgeon.  Antibiotic given.  PROCEDURE IN DETAIL:  The patient was brought to the operating room, where forearm-based IV regional anesthetic was carried out without difficulty.  She was prepped using ChloraPrep; supine position with the right arm free.  A 3-minute dry time was allowed.  Time-out taken, confirming the patient and procedure.  Oblique incision was made over the A1 pulley of the right ring finger carried down through subcutaneous tissue.  Bleeders were electrocauterized with bipolar.  Dissection carried down to a thickened A1 pulley.  A small flexor sheath cyst was present on this, this was excised. The pulley was  then incised on its radial aspect.  A small incision made centrally in A2.  A very significant tenosynovitis adherent between the flexor superficialis profundus tendon was noted proximally. The tenosynovial tissue was debrided. The finger was placed through a full range motion __________ be widely separated. The wound copiously irrigated with saline and the skin closed with interrupted 4-0 Vicryl Rapide sutures. Sterile compressive dressing was applied with the fingers free.  On deflation of the tourniquet, all fingers immediately pinked.  She was taken to the recovery room for observation in satisfactory condition.  She will be discharged home to return to the [place] in 1 week on [place].          ______________________________ Daryll Brod, M.D.     GK/MEDQ  D:  12/14/2013  T:  12/14/2013  Job:  [id number]

## 2013-12-14 NOTE — Transfer of Care (Signed)
Immediate Anesthesia Transfer of Care Note  [name]  Procedure(s) Performed: Procedure(s): RELEASE TRIGGER FINGER/A-1 PULLEY RIGHT RING FINGER (Right)  Patient Location: PACU  Anesthesia Type:Bier block  Level of Consciousness: awake, alert , oriented and patient cooperative  Airway & Oxygen Therapy: Patient Spontanous Breathing and Patient connected to face mask oxygen  Post-op Assessment: Report given to PACU RN and Post -op Vital signs reviewed and stable  Post vital signs: Reviewed and stable  Complications: No apparent anesthesia complications

## 2013-12-14 NOTE — Anesthesia Preprocedure Evaluation (Addendum)
Anesthesia Evaluation  Patient identified by MRN, date of birth, ID band Patient awake    Reviewed: Allergy & Precautions, H&P , NPO status , Patient's Chart, lab work & pertinent test results  Airway Mallampati: I TM Distance: >3 FB Neck ROM: Full    Dental  (+) Edentulous Upper, Edentulous Lower, Upper Dentures, Lower Dentures   Pulmonary  breath sounds clear to auscultation        Cardiovascular hypertension, Pt. on medications Rhythm:Regular Rate:Normal     Neuro/Psych    GI/Hepatic GERD-  Medicated and Controlled,  Endo/Other  diabetes, Well Controlled, Type 2, Insulin DependentMorbid obesity  Renal/GU      Musculoskeletal   Abdominal   Peds  Hematology   Anesthesia Other Findings   Reproductive/Obstetrics                          Anesthesia Physical Anesthesia Plan  ASA: III  Anesthesia Plan: MAC and Bier Block   Post-op Pain Management:    Induction: Intravenous  Airway Management Planned: Simple Face Mask  Additional Equipment:   Intra-op Plan:   Post-operative Plan:   Informed Consent: I have reviewed the patients History and Physical, chart, labs and discussed the procedure including the risks, benefits and alternatives for the proposed anesthesia with the patient or authorized representative who has indicated his/her understanding and acceptance.   Dental advisory given  Plan Discussed with: CRNA, Anesthesiologist and Surgeon  Anesthesia Plan Comments:         Anesthesia Quick Evaluation

## 2013-12-14 NOTE — H&P (Signed)
Kaitlyn Jackson is a 68 year old right hand dominant female referred by Dr. Jeanann Lewandowsky for a consultation with respect to catching of her right ring finger. This has been going on for several months. It is painful for her to the point she does not do activities which may cause it to catch including doing her hair. She complains of constant, severe, aching type pain when it catches with a feeling of swelling. She states it is getting worse. She has tried a splint and tape without relief. Activity makes this worse. She has no history of injury. She is on Coumadin secondary to deep vein thrombosis. She has a history of diabetes and arthritis. She has no history of thyroid problems or gout. There is a family history of diabetes and arthritis. She states she has had Cortisone injections in the past and this causes her blood sugar to significantly elevate.  PAST MEDICAL HISTORY: She has no known drug allergies. She is on the following medications: Janumet, Nexium, folic acid, furosemide, Benazepril, Verapamil ER, Warfarin, atorvastatin, Montelukast, Lyrica, Naproxen, NovoLog, Amitiza, Desonide cream, Citracal, and vitamin D. She has had total knee replacements, a cyst removed from her lower back and states she was removed from the Coumadin during those surgeries.  FAMILY H ISTORY: Positive for diabetes, high BP and arthritis.  SOCIAL HISTORY: She does not smoke or drink. She is married.  REVIEW OF SYSTEMS: Positive for glasses, high BP, blood clots, sleep disorder, otherwise negative for 14 points. ANYI FELS is an 68 y.o. female.   Chief Complaint: STS right ring finger HPI: see above  Past Medical History  Diagnosis Date  . Diabetes mellitus   . Arthritis   . Clotting disorder   . Hypertension   . GERD (gastroesophageal reflux disease)   . Heart murmur   . Mitral valve prolapse   . Sleep apnea     does not use every night- CPAP    Past Surgical History  Procedure Laterality Date   . Replacement total knee  2008-2009  . Back surgery      lower - removed  cysts    Family History  Problem Relation Age of Onset  . Cancer Mother     colon  . Cancer Cousin     lung   Social History:  reports that she has never smoked. She does not have any smokeless tobacco history on file. She reports that she does not drink alcohol or use illicit drugs.  Allergies: No Known Allergies  No prescriptions prior to admission    Results for orders placed during the hospital encounter of 12/14/13 (from the past 48 hour(s))  PROTIME-INR     Status: None   Collection Time    12/13/13 12:09 PM      Result Value Ref Range   Prothrombin Time 14.9  11.6 - 15.2 seconds   INR 1.17  0.00 - 1.49  APTT     Status: None   Collection Time    12/13/13 12:09 PM      Result Value Ref Range   aPTT 33  24 - 37 seconds    No results found.   See above  Height 5\' 4"  (1.626 m), weight 112.492 kg (248 lb).  General appearance: alert, cooperative and appears stated age Head: Normocephalic, without obvious abnormality Neck: no JVD Resp: clear to auscultation bilaterally Cardio: regular rate and rhythm, S1, S2 normal, no murmur, click, rub or gallop GI: soft, non-tender; bowel sounds normal; no masses,  no organomegaly Extremities: trigger right ring finger Pulses: 2+ and symmetric Skin: Skin color, texture, turgor normal. No rashes or lesions Neurologic: Grossly normal Incision/Wound: na  Assessment/Plan  Diagnosis: STS right ring finger.  We have discussed the possibility of injections with her with success rate. She does not desire undergoing any injections due to changes in her blood sugar. She would like to have this operated on. We will contact Dr. Carlis Abbott with respect to the Coumadin. We will have an INR done preoperatively. If it is below 2.5 we will proceed with surgery. The pre, peri and post op course are discussed along with risks and complications.  She is aware there is no  guarantee with surgery, possibility of infection, recurrence, injury to arteries, nerves and tendons, incomplete relief of symptoms and dystrophy.  She is scheduled for release A-1 pulley right ring finger as an outpatient under regional anesthesia.   Bexlee Bergdoll R 12/14/2013, 7:37 AM

## 2013-12-14 NOTE — Anesthesia Procedure Notes (Addendum)
Anesthesia Regional Block:  Bier block (IV Regional)  Pre-Anesthetic Checklist: ,, timeout performed, Correct Patient, Correct Site, Correct Laterality, Correct Procedure, Correct Position, site marked, Risks and benefits discussed,  Surgical consent,  Pre-op evaluation,  At surgeon's request and post-op pain management  Laterality: Right     Bier block (IV Regional) Narrative:   Additional Notes: Bier block placed at [phone] by Jacklynn Barnacle CRNA.  0.5% Lidocaine 345 ml in Right hand without problem or complaint.   Procedure Name: MAC Date/Time: 12/14/2013 9:33 AM Performed by: Marrianne Mood Pre-anesthesia Checklist: Patient identified, Timeout performed, Emergency Drugs available, Suction available and Patient being monitored Patient Re-evaluated:Patient Re-evaluated prior to induction

## 2013-12-14 NOTE — Anesthesia Postprocedure Evaluation (Signed)
  Anesthesia Post-op Note  [name]  Procedure(s) Performed: Procedure(s): RELEASE TRIGGER FINGER/A-1 PULLEY RIGHT RING FINGER (Right)  Patient Location: PACU  Anesthesia Type: MAC, Bier Block   Level of Consciousness: awake, alert  and oriented  Airway and Oxygen Therapy: Patient Spontanous Breathing  Post-op Pain: none  Post-op Assessment: Post-op Vital signs reviewed  Post-op Vital Signs: Reviewed  Last Vitals:  Filed Vitals:   12/14/13 1030  BP: 112/60  Pulse: 73  Temp:   Resp: 17    Complications: No apparent anesthesia complications

## 2013-12-14 NOTE — Discharge Instructions (Addendum)

## 2013-12-14 NOTE — Brief Op Note (Signed)
12/14/2013  10:02 AM  PATIENT:  Kaitlyn Jackson  68 y.o. female  PRE-OPERATIVE DIAGNOSIS:  STENOSING TENOSYNOVITIS RIGHT RING FINGER  POST-OPERATIVE DIAGNOSIS:  STENOSING TENOSYNOVITIS RIGHT RING FINGER  PROCEDURE:  Procedure(s): RELEASE TRIGGER FINGER/A-1 PULLEY RIGHT RING FINGER (Right)  SURGEON:  Surgeon(s) and Role:    * Daryll Brod, MD - Primary  PHYSICIAN ASSISTANT:   ASSISTANTS: none   ANESTHESIA:   local and regional  EBL:  Total I/O In: 200 [I.V.:200] Out: -   BLOOD ADMINISTERED:none  DRAINS: none   LOCAL MEDICATIONS USED:  BUPIVICAINE   SPECIMEN:  No Specimen  DISPOSITION OF SPECIMEN:  N/A  COUNTS:  YES  TOURNIQUET:   Total Tourniquet Time Documented: Upper Arm (Right) - 26 minutes Total: Upper Arm (Right) - 26 minutes   DICTATION: .Other Dictation: Dictation Number [id number]  PLAN OF CARE: Discharge to home after PACU  PATIENT DISPOSITION:  PACU - hemodynamically stable.

## 2013-12-14 NOTE — Op Note (Signed)
Dictation Number [id number]

## 2013-12-15 ENCOUNTER — Encounter (HOSPITAL_BASED_OUTPATIENT_CLINIC_OR_DEPARTMENT_OTHER): Payer: Self-pay | Admitting: Orthopedic Surgery

## 2014-03-12 ENCOUNTER — Encounter: Payer: Medicare Other | Attending: Internal Medicine | Admitting: *Deleted

## 2014-03-12 ENCOUNTER — Encounter: Payer: Self-pay | Admitting: *Deleted

## 2014-03-12 VITALS — Ht 64.0 in | Wt 246.5 lb

## 2014-03-12 DIAGNOSIS — E669 Obesity, unspecified: Secondary | ICD-10-CM | POA: Insufficient documentation

## 2014-03-12 DIAGNOSIS — E119 Type 2 diabetes mellitus without complications: Secondary | ICD-10-CM | POA: Diagnosis not present

## 2014-03-12 DIAGNOSIS — Z794 Long term (current) use of insulin: Secondary | ICD-10-CM | POA: Insufficient documentation

## 2014-03-12 DIAGNOSIS — Z713 Dietary counseling and surveillance: Secondary | ICD-10-CM | POA: Insufficient documentation

## 2014-03-12 DIAGNOSIS — Z6841 Body Mass Index (BMI) 40.0 and over, adult: Secondary | ICD-10-CM | POA: Diagnosis not present

## 2014-03-12 DIAGNOSIS — E1169 Type 2 diabetes mellitus with other specified complication: Secondary | ICD-10-CM

## 2014-03-12 NOTE — Patient Instructions (Signed)
Plan:  Aim for 2 Carb Choices per meal (30 grams) +/- 1 either way  Aim for 0-1 Carbs per snack if hungry  Include protein in moderation with your meals and snacks Consider reading food labels for Total Carbohydrate of foods Consider  increasing your activity level daily as tolerated, consider water walking and arm chair exercises... Continue checking BG at alternate times per day   Constinue taking medication as directed by MD

## 2014-03-12 NOTE — Progress Notes (Signed)
Medical Nutrition Therapy:  Appt start time: 0930 end time:  1100.  Assessment:  Primary concerns today: 03/12/14. Lives with husband, they share the shopping, she cooks the meals. History of DM for over 20 years, she had a Diabetes Class many years ago. She SMBG 2-3 times a day with reported range of 50-200 mg/dl. She reports hypoglycemia often during the night which she treats with whatever she can get her hands on such as regular soda or fruit juice or a banana. She will always have a low BG if she skips a meal. She enjoys walking outside 3 days a week when the weather is good. She does all of the housework too. She enjoys singing, is in 3 different groups that she practices with and often a meal is provided there.   Preferred Learning Style:   No preference indicated   Learning Readiness:   Ready  Change in progress  MEDICATIONS: see list, diabetes medications are Novolin 70/30 and Janumet   DIETARY INTAKE:  24-hr recall:  B ( AM): not very hungry in AM, grits OR toast with cheese OR oatmeal OR pancakes with SF syrup, OR fruit smoothie, water, coffee or juice if BG is lower  Snk ( AM): occasionally fresh fruit  L ( PM): skips sometimes, 1/2 sandwich with fresh fruit OR salad OR fast food if running errands Snk ( PM): occasionally PNB D ( PM): main meal with meat, starch and vegetables but no dark green vegetables  Snk ( PM): if BG is lower- small dish of ice cream  Beverages: water, coffee, diluted sweet tea  Usual physical activity: cleans her own home and walks with a friend outside in good weather  Estimated energy needs: 1200 calories 135 g carbohydrates 90 g protein 33 g fat  Progress Towards Goal(s):  In progress.   Nutritional Diagnosis:  NB-1.1 Food and nutrition-related knowledge deficit As related to Diabetes.  As evidenced by A1c of 8% per patient report.    Intervention:  Nutrition counseling and diabetes education initiated. Discussed Carb Counting as method  of portion control, reading food labels, and benefits of increased activity. Discussed SMBG targets and how they relate to A1c goals, action of her insulin and prevention / treatment of hypoglycemia. Also discussed sources of Vitamin K which she needs to avoid while on Warfarin per her request.  Plan:  Aim for 2 Carb Choices per meal (30 grams) +/- 1 either way  Aim for 0-1 Carbs per snack if hungry  Include protein in moderation with your meals and snacks Consider reading food labels for Total Carbohydrate of foods Consider  increasing your activity level daily as tolerated, consider water walking and arm chair exercises... Continue checking BG at alternate times per day   Constinue taking medication as directed by MD  Teaching Method Utilized:  Visual Auditory Hands on  Handouts given during visit include: Living Well with Diabetes Carb Counting and Food Label handouts Meal Plan Card  Barriers to learning/adherence to lifestyle change: none  Demonstrated degree of understanding via:  Teach Back   Monitoring/Evaluation:  Dietary intake, exercise, reading food labels, and body weight in 1 month(s).

## 2014-04-12 ENCOUNTER — Ambulatory Visit: Payer: Medicare Other | Admitting: *Deleted

## 2014-10-05 ENCOUNTER — Other Ambulatory Visit: Payer: Self-pay | Admitting: Internal Medicine

## 2014-10-05 DIAGNOSIS — R109 Unspecified abdominal pain: Secondary | ICD-10-CM

## 2014-10-15 ENCOUNTER — Encounter (HOSPITAL_COMMUNITY)
Admission: RE | Admit: 2014-10-15 | Discharge: 2014-10-15 | Disposition: A | Discharge status: Home / Self Care | Payer: Medicare Other | Source: Ambulatory Visit | Attending: Internal Medicine | Admitting: Internal Medicine

## 2014-10-15 DIAGNOSIS — R109 Unspecified abdominal pain: Secondary | ICD-10-CM | POA: Insufficient documentation

## 2014-10-15 MED ORDER — TECHNETIUM TC 99M MEDRONATE IV KIT
27.0000 | PACK | Freq: Once | INTRAVENOUS | Status: AC | PRN
  Administered 2014-10-15: 27 via INTRAVENOUS

## 2014-11-08 ENCOUNTER — Encounter: Payer: Self-pay | Admitting: Internal Medicine

## 2014-11-23 ENCOUNTER — Encounter (INDEPENDENT_AMBULATORY_CARE_PROVIDER_SITE_OTHER): Payer: Medicare Other | Admitting: Ophthalmology

## 2014-11-23 DIAGNOSIS — I10 Essential (primary) hypertension: Secondary | ICD-10-CM | POA: Diagnosis not present

## 2014-11-23 DIAGNOSIS — H43813 Vitreous degeneration, bilateral: Secondary | ICD-10-CM

## 2014-11-23 DIAGNOSIS — H35033 Hypertensive retinopathy, bilateral: Secondary | ICD-10-CM | POA: Diagnosis not present

## 2014-11-23 DIAGNOSIS — H33302 Unspecified retinal break, left eye: Secondary | ICD-10-CM | POA: Diagnosis not present

## 2014-11-23 DIAGNOSIS — E11331 Type 2 diabetes mellitus with moderate nonproliferative diabetic retinopathy with macular edema: Secondary | ICD-10-CM

## 2014-11-23 DIAGNOSIS — E11329 Type 2 diabetes mellitus with mild nonproliferative diabetic retinopathy without macular edema: Secondary | ICD-10-CM | POA: Diagnosis not present

## 2014-11-23 DIAGNOSIS — E11311 Type 2 diabetes mellitus with unspecified diabetic retinopathy with macular edema: Secondary | ICD-10-CM

## 2014-12-07 ENCOUNTER — Ambulatory Visit (INDEPENDENT_AMBULATORY_CARE_PROVIDER_SITE_OTHER): Payer: Medicare Other | Admitting: Ophthalmology

## 2014-12-07 DIAGNOSIS — E11311 Type 2 diabetes mellitus with unspecified diabetic retinopathy with macular edema: Secondary | ICD-10-CM

## 2014-12-07 DIAGNOSIS — H33302 Unspecified retinal break, left eye: Secondary | ICD-10-CM | POA: Diagnosis not present

## 2014-12-12 ENCOUNTER — Other Ambulatory Visit: Payer: Self-pay | Admitting: Physical Medicine and Rehabilitation

## 2014-12-12 DIAGNOSIS — M545 Low back pain: Secondary | ICD-10-CM

## 2014-12-24 ENCOUNTER — Inpatient Hospital Stay: Admission: RE | Admit: 2014-12-24 | Payer: Medicare Other | Source: Ambulatory Visit

## 2014-12-28 ENCOUNTER — Ambulatory Visit
Admission: RE | Admit: 2014-12-28 | Discharge: 2014-12-28 | Disposition: A | Discharge status: Home / Self Care | Payer: Medicare Other | Source: Ambulatory Visit | Attending: Physical Medicine and Rehabilitation | Admitting: Physical Medicine and Rehabilitation

## 2014-12-28 DIAGNOSIS — M545 Low back pain: Secondary | ICD-10-CM

## 2015-01-04 ENCOUNTER — Encounter (INDEPENDENT_AMBULATORY_CARE_PROVIDER_SITE_OTHER): Payer: Medicare Other | Admitting: Ophthalmology

## 2015-01-04 DIAGNOSIS — E113311 Type 2 diabetes mellitus with moderate nonproliferative diabetic retinopathy with macular edema, right eye: Secondary | ICD-10-CM

## 2015-01-04 DIAGNOSIS — H33302 Unspecified retinal break, left eye: Secondary | ICD-10-CM

## 2015-01-04 DIAGNOSIS — H35033 Hypertensive retinopathy, bilateral: Secondary | ICD-10-CM

## 2015-01-04 DIAGNOSIS — I10 Essential (primary) hypertension: Secondary | ICD-10-CM

## 2015-01-04 DIAGNOSIS — E11311 Type 2 diabetes mellitus with unspecified diabetic retinopathy with macular edema: Secondary | ICD-10-CM

## 2015-01-04 DIAGNOSIS — E113292 Type 2 diabetes mellitus with mild nonproliferative diabetic retinopathy without macular edema, left eye: Secondary | ICD-10-CM | POA: Diagnosis not present

## 2015-01-16 ENCOUNTER — Emergency Department (HOSPITAL_COMMUNITY)
Admission: EM | Admit: 2015-01-16 | Discharge: 2015-01-16 | Disposition: A | Discharge status: Home / Self Care | Payer: Medicare Other | Attending: Emergency Medicine | Admitting: Emergency Medicine

## 2015-01-16 ENCOUNTER — Encounter (HOSPITAL_COMMUNITY): Payer: Self-pay | Admitting: Emergency Medicine

## 2015-01-16 DIAGNOSIS — M1990 Unspecified osteoarthritis, unspecified site: Secondary | ICD-10-CM | POA: Diagnosis not present

## 2015-01-16 DIAGNOSIS — E119 Type 2 diabetes mellitus without complications: Secondary | ICD-10-CM | POA: Insufficient documentation

## 2015-01-16 DIAGNOSIS — I10 Essential (primary) hypertension: Secondary | ICD-10-CM | POA: Diagnosis not present

## 2015-01-16 DIAGNOSIS — L299 Pruritus, unspecified: Secondary | ICD-10-CM

## 2015-01-16 DIAGNOSIS — Z7901 Long term (current) use of anticoagulants: Secondary | ICD-10-CM | POA: Insufficient documentation

## 2015-01-16 DIAGNOSIS — Z79899 Other long term (current) drug therapy: Secondary | ICD-10-CM | POA: Diagnosis not present

## 2015-01-16 DIAGNOSIS — Z794 Long term (current) use of insulin: Secondary | ICD-10-CM | POA: Diagnosis not present

## 2015-01-16 DIAGNOSIS — K219 Gastro-esophageal reflux disease without esophagitis: Secondary | ICD-10-CM | POA: Insufficient documentation

## 2015-01-16 DIAGNOSIS — Z7951 Long term (current) use of inhaled steroids: Secondary | ICD-10-CM | POA: Insufficient documentation

## 2015-01-16 DIAGNOSIS — Z7952 Long term (current) use of systemic steroids: Secondary | ICD-10-CM | POA: Insufficient documentation

## 2015-01-16 DIAGNOSIS — R011 Cardiac murmur, unspecified: Secondary | ICD-10-CM | POA: Insufficient documentation

## 2015-01-16 MED ORDER — HYDROXYZINE HCL 25 MG PO TABS: 25.0000 mg | ORAL_TABLET | Freq: Four times a day (QID) | ORAL | Status: DC

## 2015-01-16 NOTE — ED Notes (Signed)
Pt c/o itching for about 4 days. Pt denies rash as well as any new soaps, lotions, perfumes.  Pt states that she itches from head to bottoms of her feet.  Pt states that she is using a new detergent but states that she has used it before.

## 2015-01-16 NOTE — Discharge Instructions (Signed)
Pruritus Pruritus is an itching feeling. There are many different conditions and factors that can make your skin itchy. Dry skin is one of the most common causes of itching. Most cases of itching do not require medical attention. Itchy skin can turn into a rash.  HOME CARE INSTRUCTIONS  Watch your pruritus for any changes. Take these steps to help with your condition:  Skin Care  Moisturize your skin as needed. A moisturizer that contains petroleum jelly is best for keeping moisture in your skin.  Take or apply medicines only as directed by your health care provider. This may include:  Corticosteroid cream.  Anti-itch lotions.  Oral anti-histamines.  Apply cool compresses to the affected areas.  Try taking a bath with:  Epsom salts. Follow the instructions on the packaging. You can get these at your local pharmacy or grocery store.  Baking soda. Pour a small amount into the bath as directed by your health care provider.  Colloidal oatmeal. Follow the instructions on the packaging. You can get this at your local pharmacy or grocery store.  Try applying baking soda paste to your skin. Stir water into baking soda until it reaches a paste-like consistency.   Do not scratch your skin.  Avoid hot showers or baths, which can make itching worse. A cold shower may help with itching as long as you use a moisturizer after.  Avoid scented soaps, detergents, and perfumes. Use gentle soaps, detergents, perfumes, and other cosmetic products. General Instructions  Avoid wearing tight clothes.  Keep a journal to help track what causes your itch. Write down:  What you eat.  What cosmetic products you use.  What you drink.  What you wear. This includes jewelry.  Use a humidifier. This keeps the air moist, which helps to prevent dry skin. SEEK MEDICAL CARE IF:  The itching does not go away after several days.  You sweat at night.  You have weight loss.  You are unusually  thirsty.  You urinate more than normal.  You are more tired than normal.  You have abdominal pain.  Your skin tingles.  You feel weak.  Your skin or the whites of your eyes look yellow (jaundice).  Your skin feels numb.   This information is not intended to replace advice given to you by your health care provider. Make sure you discuss any questions you have with your health care provider.   Follow up with your PCP for lab evaluation of itchiness if symptoms persist. See above for symptoms that warrant return to the Emergency Department

## 2015-01-17 NOTE — ED Provider Notes (Signed)
CSN: [id number]     Arrival date & time 01/16/15  1053 History   First MD Initiated Contact with Patient 01/16/15 1142     Chief Complaint  Patient presents with  . Pruritis     (Consider location/radiation/quality/duration/timing/severity/associated sxs/prior Treatment) HPI   Kaitlyn Jackson is a 69 y.o female with history of diabetes, HTN who presents the emergency department complaining of itching. Patient states that about 4 days ago her entire body began to itch, premature for scalp down to her toes. Patient has not noticed a rash. No new lotions, detergents, medications. Patient has tried taking Claritin and Allegra with no relief. No history of liver disease or thyroid disease. No fever, recent illness, congestion, sore throat, shortness of breath, tongue or lip swelling.  Past Medical History  Diagnosis Date  . Diabetes mellitus   . Arthritis   . Clotting disorder ([place])   . Hypertension   . GERD (gastroesophageal reflux disease)   . Heart murmur   . Mitral valve prolapse   . Sleep apnea     does not use every night- CPAP   Past Surgical History  Procedure Laterality Date  . Replacement total knee  2008-2009  . Back surgery      lower - removed  cysts  . Trigger finger release Right 12/14/2013    Procedure: RELEASE TRIGGER FINGER/A-1 PULLEY RIGHT RING FINGER;  Surgeon: Daryll Brod, MD;  Location: [facility];  Service: Orthopedics;  Laterality: Right;   Family History  Problem Relation Age of Onset  . Cancer Mother     colon  . Cancer Cousin     lung   Social History  Substance Use Topics  . Smoking status: Never Smoker   . Smokeless tobacco: None  . Alcohol Use: No   OB History    No data available     Review of Systems  All other systems reviewed and are negative.     Allergies  Review of patient's allergies indicates no known allergies.  Home Medications   Prior to Admission medications   Medication Sig Start Date End Date Taking?  Authorizing Provider  acetaminophen (TYLENOL) 500 MG tablet Take 500 mg by mouth every 6 (six) hours as needed for moderate pain.   Yes Historical Provider, MD  atorvastatin (LIPITOR) 20 MG tablet Take 10 mg by mouth at bedtime.   Yes Historical Provider, MD  benazepril (LOTENSIN) 40 MG tablet Take 40 mg by mouth daily.   Yes Historical Provider, MD  calamine lotion Apply 1 application topically as needed for itching.   Yes Historical Provider, MD  chlorpheniramine (CHLOR-TRIMETON) 4 MG tablet Take 4 mg by mouth 2 (two) times daily as needed for allergies.   Yes Historical Provider, MD  cholecalciferol (VITAMIN D) 1000 UNITS tablet Take 1,000 Units by mouth daily.   Yes Historical Provider, MD  desonide (DESOWEN) 0.05 % lotion Apply 1 application topically 2 (two) times daily.   Yes Historical Provider, MD  esomeprazole (NEXIUM) 40 MG capsule Take 40 mg by mouth daily before breakfast.   Yes Historical Provider, MD  fexofenadine (ALLEGRA) 180 MG tablet Take 180 mg by mouth daily.   Yes Historical Provider, MD  fluticasone (FLONASE) 50 MCG/ACT nasal spray Place 1 spray into both nostrils daily.   Yes Historical Provider, MD  folic acid (FOLVITE) 1 MG tablet Take 1 mg by mouth daily.   Yes Historical Provider, MD  furosemide (LASIX) 40 MG tablet Take 40 mg by mouth daily.  Yes Historical Provider, MD  insulin NPH-insulin regular (NOVOLIN 70/30) (70-30) 100 UNIT/ML injection Inject 10-30 Units into the skin daily with breakfast. Takes 30 units in the morning and 10 units at night   Yes Historical Provider, MD  montelukast (SINGULAIR) 10 MG tablet Take 10 mg by mouth at bedtime.   Yes Historical Provider, MD  Multiple Vitamins-Minerals (VITAMIN D3 COMPLETE PO) Take 1,500 tablets by mouth.   Yes Historical Provider, MD  pregabalin (LYRICA) 100 MG capsule Take 100 mg by mouth 2 (two) times daily.   Yes Historical Provider, MD  sitaGLIPtan-metformin (JANUMET) 50-1000 MG per tablet Take 1 tablet by mouth 2  (two) times daily with a meal.   Yes Historical Provider, MD  Sulfacetamide Sodium (SODIUM SULFACETAMIDE EX) Apply 1 application topically every 6 (six) hours as needed (for itching).    Yes Historical Provider, MD  verapamil (COVERA HS) 180 MG (CO) 24 hr tablet Take 180 mg by mouth 2 (two) times daily.   Yes Historical Provider, MD  warfarin (COUMADIN) 5 MG tablet Take 5 mg by mouth as directed. Takes 2 tablets Thursday,friday,saturday and Sunday and 1&1/2 tablet Monday,tuesday,and wednesday   Yes Historical Provider, MD  HYDROcodone-acetaminophen (NORCO) 5-325 MG per tablet Take 1 tablet by mouth every 6 (six) hours as needed for moderate pain. Patient not taking: Reported on 01/16/2015 12/14/13   Daryll Brod, MD  hydrOXYzine (ATARAX/VISTARIL) 25 MG tablet Take 1 tablet (25 mg total) by mouth every 6 (six) hours. 01/16/15   Samantha Tripp Dowless, PA-C   BP 127/65 mmHg  Pulse 72  Temp(Src) 98.1 F (36.7 C) (Oral)  Resp 18  SpO2 98% Physical Exam  Constitutional: She is oriented to person, place, and time. She appears well-developed and well-nourished. No distress.  HENT:  Head: Normocephalic and atraumatic.  Nose: Nose normal.  Mouth/Throat: Oropharynx is clear and moist. No oropharyngeal exudate.  Eyes: Conjunctivae and EOM are normal. Pupils are equal, round, and reactive to light. Right eye exhibits no discharge. Left eye exhibits no discharge. No scleral icterus.  Neck: Normal range of motion. Neck supple. No thyromegaly present.  Cardiovascular: Normal rate, regular rhythm, normal heart sounds and intact distal pulses.  Exam reveals no gallop and no friction rub.   No murmur heard. Pulmonary/Chest: Effort normal and breath sounds normal. No respiratory distress. She has no wheezes. She has no rales. She exhibits no tenderness.  Abdominal: Soft. She exhibits no distension and no mass. There is no tenderness. There is no rebound and no guarding.  No hepatomegaly. No telangiectasias, palmar  erythema, caput medusa or other evidence of liver disease.  Musculoskeletal: Normal range of motion. She exhibits no edema.  Lymphadenopathy:    She has no cervical adenopathy.  Neurological: She is alert and oriented to person, place, and time.  Skin: Skin is warm and dry. No rash noted. She is not diaphoretic. No erythema. No pallor.  No rash or excoriations.  Psychiatric: She has a normal mood and affect. Her behavior is normal.  Nursing note and vitals reviewed.   ED Course  Procedures (including critical care time) Labs Review Labs Reviewed - No data to display  Imaging Review No results found. I have personally reviewed and evaluated these images and lab results as part of my medical decision-making.   EKG Interpretation None      MDM   Final diagnoses:  Itchy skin    69 year old female with history of diabetes presents with 4 days of pruritus. Patient states she  has itching all over her body from the top of her head down to her toes. No history of liver disease or thyroid disease. No evidence of a rash or urticaria. Patient is taking Claritin and Allegra without relief. Vital signs stable. No evidence of angioedema. No difficulty breathing.  Will DC home with Atarax. Encourage use of hydrocortisone cream. Follow-up with PCP for further lab workup if symptoms persist. Discussed plan with patient is agreeable. Return precautions outlined in the discharge instructions.    Dondra Spry [place], PA-C 01/17/15 [place], MD 01/17/15 1655

## 2015-01-31 ENCOUNTER — Encounter (INDEPENDENT_AMBULATORY_CARE_PROVIDER_SITE_OTHER): Payer: Medicare Other | Admitting: Ophthalmology

## 2015-01-31 DIAGNOSIS — E113311 Type 2 diabetes mellitus with moderate nonproliferative diabetic retinopathy with macular edema, right eye: Secondary | ICD-10-CM

## 2015-01-31 DIAGNOSIS — H35033 Hypertensive retinopathy, bilateral: Secondary | ICD-10-CM | POA: Diagnosis not present

## 2015-01-31 DIAGNOSIS — H43813 Vitreous degeneration, bilateral: Secondary | ICD-10-CM

## 2015-01-31 DIAGNOSIS — H2513 Age-related nuclear cataract, bilateral: Secondary | ICD-10-CM | POA: Diagnosis not present

## 2015-01-31 DIAGNOSIS — E11311 Type 2 diabetes mellitus with unspecified diabetic retinopathy with macular edema: Secondary | ICD-10-CM | POA: Diagnosis not present

## 2015-01-31 DIAGNOSIS — H33302 Unspecified retinal break, left eye: Secondary | ICD-10-CM

## 2015-01-31 DIAGNOSIS — I10 Essential (primary) hypertension: Secondary | ICD-10-CM | POA: Diagnosis not present

## 2015-01-31 DIAGNOSIS — E113392 Type 2 diabetes mellitus with moderate nonproliferative diabetic retinopathy without macular edema, left eye: Secondary | ICD-10-CM | POA: Diagnosis not present

## 2015-02-28 ENCOUNTER — Encounter (INDEPENDENT_AMBULATORY_CARE_PROVIDER_SITE_OTHER): Payer: Medicare Other | Admitting: Ophthalmology

## 2015-02-28 DIAGNOSIS — H35033 Hypertensive retinopathy, bilateral: Secondary | ICD-10-CM | POA: Diagnosis not present

## 2015-02-28 DIAGNOSIS — I10 Essential (primary) hypertension: Secondary | ICD-10-CM | POA: Diagnosis not present

## 2015-02-28 DIAGNOSIS — E113292 Type 2 diabetes mellitus with mild nonproliferative diabetic retinopathy without macular edema, left eye: Secondary | ICD-10-CM | POA: Diagnosis not present

## 2015-02-28 DIAGNOSIS — E113211 Type 2 diabetes mellitus with mild nonproliferative diabetic retinopathy with macular edema, right eye: Secondary | ICD-10-CM

## 2015-02-28 DIAGNOSIS — E11311 Type 2 diabetes mellitus with unspecified diabetic retinopathy with macular edema: Secondary | ICD-10-CM | POA: Diagnosis not present

## 2015-02-28 DIAGNOSIS — H33302 Unspecified retinal break, left eye: Secondary | ICD-10-CM | POA: Diagnosis not present

## 2015-02-28 DIAGNOSIS — H43813 Vitreous degeneration, bilateral: Secondary | ICD-10-CM | POA: Diagnosis not present

## 2015-04-18 ENCOUNTER — Encounter (INDEPENDENT_AMBULATORY_CARE_PROVIDER_SITE_OTHER): Payer: Commercial Managed Care - HMO | Admitting: Ophthalmology

## 2015-04-18 DIAGNOSIS — E113211 Type 2 diabetes mellitus with mild nonproliferative diabetic retinopathy with macular edema, right eye: Secondary | ICD-10-CM

## 2015-04-18 DIAGNOSIS — H43813 Vitreous degeneration, bilateral: Secondary | ICD-10-CM | POA: Diagnosis not present

## 2015-04-18 DIAGNOSIS — E11311 Type 2 diabetes mellitus with unspecified diabetic retinopathy with macular edema: Secondary | ICD-10-CM

## 2015-04-18 DIAGNOSIS — H2513 Age-related nuclear cataract, bilateral: Secondary | ICD-10-CM

## 2015-04-18 DIAGNOSIS — H35033 Hypertensive retinopathy, bilateral: Secondary | ICD-10-CM

## 2015-04-18 DIAGNOSIS — I10 Essential (primary) hypertension: Secondary | ICD-10-CM

## 2015-04-18 DIAGNOSIS — E113292 Type 2 diabetes mellitus with mild nonproliferative diabetic retinopathy without macular edema, left eye: Secondary | ICD-10-CM

## 2015-06-04 DIAGNOSIS — Z86718 Personal history of other venous thrombosis and embolism: Secondary | ICD-10-CM | POA: Insufficient documentation

## 2015-06-04 DIAGNOSIS — I82409 Acute embolism and thrombosis of unspecified deep veins of unspecified lower extremity: Secondary | ICD-10-CM | POA: Insufficient documentation

## 2015-06-06 ENCOUNTER — Encounter (INDEPENDENT_AMBULATORY_CARE_PROVIDER_SITE_OTHER): Payer: Commercial Managed Care - HMO | Admitting: Ophthalmology

## 2015-06-06 DIAGNOSIS — H33302 Unspecified retinal break, left eye: Secondary | ICD-10-CM

## 2015-06-06 DIAGNOSIS — H43813 Vitreous degeneration, bilateral: Secondary | ICD-10-CM | POA: Diagnosis not present

## 2015-06-06 DIAGNOSIS — E113292 Type 2 diabetes mellitus with mild nonproliferative diabetic retinopathy without macular edema, left eye: Secondary | ICD-10-CM | POA: Diagnosis not present

## 2015-06-06 DIAGNOSIS — E11311 Type 2 diabetes mellitus with unspecified diabetic retinopathy with macular edema: Secondary | ICD-10-CM

## 2015-06-06 DIAGNOSIS — H35033 Hypertensive retinopathy, bilateral: Secondary | ICD-10-CM

## 2015-06-06 DIAGNOSIS — E113211 Type 2 diabetes mellitus with mild nonproliferative diabetic retinopathy with macular edema, right eye: Secondary | ICD-10-CM

## 2015-06-06 DIAGNOSIS — I10 Essential (primary) hypertension: Secondary | ICD-10-CM | POA: Diagnosis not present

## 2015-07-18 ENCOUNTER — Encounter (INDEPENDENT_AMBULATORY_CARE_PROVIDER_SITE_OTHER): Payer: Commercial Managed Care - HMO | Admitting: Ophthalmology

## 2015-07-18 DIAGNOSIS — E11311 Type 2 diabetes mellitus with unspecified diabetic retinopathy with macular edema: Secondary | ICD-10-CM | POA: Diagnosis not present

## 2015-07-18 DIAGNOSIS — H43813 Vitreous degeneration, bilateral: Secondary | ICD-10-CM

## 2015-07-18 DIAGNOSIS — E113292 Type 2 diabetes mellitus with mild nonproliferative diabetic retinopathy without macular edema, left eye: Secondary | ICD-10-CM

## 2015-07-18 DIAGNOSIS — E113311 Type 2 diabetes mellitus with moderate nonproliferative diabetic retinopathy with macular edema, right eye: Secondary | ICD-10-CM

## 2015-07-18 DIAGNOSIS — H35033 Hypertensive retinopathy, bilateral: Secondary | ICD-10-CM | POA: Diagnosis not present

## 2015-07-18 DIAGNOSIS — H35372 Puckering of macula, left eye: Secondary | ICD-10-CM

## 2015-07-18 DIAGNOSIS — I10 Essential (primary) hypertension: Secondary | ICD-10-CM | POA: Diagnosis not present

## 2015-07-18 DIAGNOSIS — H33302 Unspecified retinal break, left eye: Secondary | ICD-10-CM

## 2015-09-12 ENCOUNTER — Encounter (INDEPENDENT_AMBULATORY_CARE_PROVIDER_SITE_OTHER): Payer: Commercial Managed Care - HMO | Admitting: Ophthalmology

## 2015-09-12 DIAGNOSIS — H43813 Vitreous degeneration, bilateral: Secondary | ICD-10-CM | POA: Diagnosis not present

## 2015-09-12 DIAGNOSIS — E11311 Type 2 diabetes mellitus with unspecified diabetic retinopathy with macular edema: Secondary | ICD-10-CM

## 2015-09-12 DIAGNOSIS — E113211 Type 2 diabetes mellitus with mild nonproliferative diabetic retinopathy with macular edema, right eye: Secondary | ICD-10-CM | POA: Diagnosis not present

## 2015-09-12 DIAGNOSIS — E113292 Type 2 diabetes mellitus with mild nonproliferative diabetic retinopathy without macular edema, left eye: Secondary | ICD-10-CM

## 2015-09-12 DIAGNOSIS — H35373 Puckering of macula, bilateral: Secondary | ICD-10-CM

## 2015-09-12 DIAGNOSIS — H33302 Unspecified retinal break, left eye: Secondary | ICD-10-CM

## 2015-09-12 DIAGNOSIS — H35033 Hypertensive retinopathy, bilateral: Secondary | ICD-10-CM

## 2015-09-12 DIAGNOSIS — I10 Essential (primary) hypertension: Secondary | ICD-10-CM | POA: Diagnosis not present

## 2015-11-02 ENCOUNTER — Encounter (INDEPENDENT_AMBULATORY_CARE_PROVIDER_SITE_OTHER): Payer: Self-pay

## 2015-11-09 ENCOUNTER — Encounter (HOSPITAL_COMMUNITY): Payer: Self-pay | Admitting: Emergency Medicine

## 2015-11-09 ENCOUNTER — Emergency Department (HOSPITAL_COMMUNITY): Payer: Medicare HMO

## 2015-11-09 ENCOUNTER — Emergency Department (HOSPITAL_COMMUNITY)
Admission: EM | Admit: 2015-11-09 | Discharge: 2015-11-09 | Disposition: A | Discharge status: Home / Self Care | Payer: Medicare HMO | Attending: Emergency Medicine | Admitting: Emergency Medicine

## 2015-11-09 DIAGNOSIS — R1032 Left lower quadrant pain: Secondary | ICD-10-CM | POA: Diagnosis not present

## 2015-11-09 DIAGNOSIS — I10 Essential (primary) hypertension: Secondary | ICD-10-CM | POA: Diagnosis not present

## 2015-11-09 DIAGNOSIS — E119 Type 2 diabetes mellitus without complications: Secondary | ICD-10-CM | POA: Diagnosis not present

## 2015-11-09 DIAGNOSIS — Z7901 Long term (current) use of anticoagulants: Secondary | ICD-10-CM | POA: Insufficient documentation

## 2015-11-09 DIAGNOSIS — Z96659 Presence of unspecified artificial knee joint: Secondary | ICD-10-CM | POA: Diagnosis not present

## 2015-11-09 DIAGNOSIS — R109 Unspecified abdominal pain: Secondary | ICD-10-CM

## 2015-11-09 DIAGNOSIS — Z79899 Other long term (current) drug therapy: Secondary | ICD-10-CM | POA: Diagnosis not present

## 2015-11-09 DIAGNOSIS — Z794 Long term (current) use of insulin: Secondary | ICD-10-CM | POA: Diagnosis not present

## 2015-11-09 LAB — URINALYSIS, ROUTINE W REFLEX MICROSCOPIC
BILIRUBIN URINE: NEGATIVE
Glucose, UA: NEGATIVE mg/dL
HGB URINE DIPSTICK: NEGATIVE
KETONES UR: NEGATIVE mg/dL
Leukocytes, UA: NEGATIVE
NITRITE: NEGATIVE
Protein, ur: NEGATIVE mg/dL
SPECIFIC GRAVITY, URINE: 1.012 (ref 1.005–1.030)
pH: 6 (ref 5.0–8.0)

## 2015-11-09 LAB — COMPREHENSIVE METABOLIC PANEL
ALBUMIN: 3.5 g/dL (ref 3.5–5.0)
ALK PHOS: 98 U/L (ref 38–126)
ALT: 18 U/L (ref 14–54)
ANION GAP: 8 (ref 5–15)
AST: 20 U/L (ref 15–41)
BILIRUBIN TOTAL: 0.4 mg/dL (ref 0.3–1.2)
BUN: 8 mg/dL (ref 6–20)
CALCIUM: 9.4 mg/dL (ref 8.9–10.3)
CO2: 25 mmol/L (ref 22–32)
CREATININE: 0.81 mg/dL (ref 0.44–1.00)
Chloride: 103 mmol/L (ref 101–111)
GFR calc Af Amer: >60 mL/min (ref >60)
GFR calc non Af Amer: >60 mL/min (ref >60)
GLUCOSE: 216 mg/dL — AB (ref 65–99)
Potassium: 3.9 mmol/L (ref 3.5–5.1)
SODIUM: 136 mmol/L (ref 135–145)
TOTAL PROTEIN: 7.3 g/dL (ref 6.5–8.1)

## 2015-11-09 LAB — CBC
HCT: 41.7 % (ref 36.0–46.0)
HEMOGLOBIN: 13.5 g/dL (ref 12.0–15.0)
MCH: 28.7 pg (ref 26.0–34.0)
MCHC: 32.4 g/dL (ref 30.0–36.0)
MCV: 88.7 fL (ref 78.0–100.0)
Platelets: 404 10*3/uL — ABNORMAL HIGH (ref 150–400)
RBC: 4.7 MIL/uL (ref 3.87–5.11)
RDW: 15.6 % — AB (ref 11.5–15.5)
WBC: 10 10*3/uL (ref 4.0–10.5)

## 2015-11-09 LAB — LIPASE, BLOOD: Lipase: 24 U/L (ref 11–51)

## 2015-11-09 MED ORDER — IOPAMIDOL (ISOVUE-300) INJECTION 61%
INTRAVENOUS | Status: AC
  Administered 2015-11-09: 100 mL
  Filled 2015-11-09: qty 100

## 2015-11-09 MED ORDER — GI COCKTAIL ~~LOC~~
30.0000 mL | Freq: Once | ORAL | Status: AC
  Administered 2015-11-09: 30 mL via ORAL
  Filled 2015-11-09: qty 30

## 2015-11-09 MED ORDER — KETOROLAC TROMETHAMINE 15 MG/ML IJ SOLN
15.0000 mg | Freq: Once | INTRAMUSCULAR | Status: AC
  Administered 2015-11-09: 15 mg via INTRAVENOUS
  Filled 2015-11-09: qty 1

## 2015-11-09 MED ORDER — MORPHINE SULFATE (PF) 2 MG/ML IV SOLN
2.0000 mg | Freq: Once | INTRAVENOUS | Status: AC
  Administered 2015-11-09: 2 mg via INTRAVENOUS
  Filled 2015-11-09: qty 1

## 2015-11-09 MED ORDER — DICYCLOMINE HCL 20 MG PO TABS: 20.0000 mg | ORAL_TABLET | Freq: Two times a day (BID) | ORAL | 0 refills | Status: DC

## 2015-11-09 MED ORDER — MORPHINE SULFATE (PF) 2 MG/ML IV SOLN
INTRAVENOUS | Status: AC
  Filled 2015-11-09: qty 1

## 2015-11-09 NOTE — ED Notes (Signed)
Difficulties with autoinjecting morphine package.  Wasted in pyxis with Earlie Lou., RN and then overridden to administer to patient.

## 2015-11-09 NOTE — ED Triage Notes (Signed)
Pt here with left sided abdominal pain starting yesterday. Pt denies n/v/fever. Pt used epsom salt yesterday to help with having a BM.

## 2015-11-09 NOTE — ED Provider Notes (Signed)
[facility] DEPT Provider Note   CSN: [id number] Arrival date & time: 11/09/15  1330  History   Chief Complaint Chief Complaint  Patient presents with  . Abdominal Pain   HPI  Kaitlyn Jackson is an 70 y.o. female with history of DM, HTN, back surgery, GERD who presents to the ED for evaluation of left sided abdominal pain. She reports left abdominal pain particularly in her lower left quadrant over the past 2-3 days. She states the pain is constant though waxes and wanes in severity. She states at its worse it was "more than 10/10" and is currently and 8-9/10. She denies nausea, vomiting, or diarrhea. She states she tried taking some hydrocodone with no relief but it did make her a bit constipated. Last BM today though smaller than typical. States last night her pain got so bad she tried taking some epsom salt with no relief. Denies urinary symptoms. Denies BRBPR or black tarry stools. Denies fever or chills. Denies h/o abdominal surgeries.  Past Medical History:  Diagnosis Date  . Arthritis   . Clotting disorder ([place])   . Diabetes mellitus   . GERD (gastroesophageal reflux disease)   . Heart murmur   . Hypertension   . Mitral valve prolapse   . Sleep apnea    does not use every night- CPAP    Patient Active Problem List   Diagnosis Date Noted  . Osteopenia 03/14/2012  . Diabetes mellitus type 2 in obese ([place]) 03/14/2012  . 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 Hypertension 03/14/2012    Past Surgical History:  Procedure Laterality Date  . BACK SURGERY     lower - removed  cysts  . REPLACEMENT TOTAL KNEE  2008-2009  . TRIGGER FINGER RELEASE Right 12/14/2013   Procedure: RELEASE TRIGGER FINGER/A-1 PULLEY RIGHT RING FINGER;  Surgeon: Daryll Brod, MD;  Location: [facility];  Service: Orthopedics;  Laterality: Right;    OB History    No data available       Home Medications    Prior to Admission medications   Medication Sig  Start Date End Date Taking? Authorizing Provider  acetaminophen (TYLENOL) 500 MG tablet Take 500 mg by mouth every 6 (six) hours as needed for moderate pain.    Historical Provider, MD  atorvastatin (LIPITOR) 20 MG tablet Take 10 mg by mouth at bedtime.    Historical Provider, MD  benazepril (LOTENSIN) 40 MG tablet Take 40 mg by mouth daily.    Historical Provider, MD  calamine lotion Apply 1 application topically as needed for itching.    Historical Provider, MD  chlorpheniramine (CHLOR-TRIMETON) 4 MG tablet Take 4 mg by mouth 2 (two) times daily as needed for allergies.    Historical Provider, MD  cholecalciferol (VITAMIN D) 1000 UNITS tablet Take 1,000 Units by mouth daily.    Historical Provider, MD  desonide (DESOWEN) 0.05 % lotion Apply 1 application topically 2 (two) times daily.    Historical Provider, MD  esomeprazole (NEXIUM) 40 MG capsule Take 40 mg by mouth daily before breakfast.    Historical Provider, MD  fexofenadine (ALLEGRA) 180 MG tablet Take 180 mg by mouth daily.    Historical Provider, MD  fluticasone (FLONASE) 50 MCG/ACT nasal spray Place 1 spray into both nostrils daily.    Historical Provider, MD  folic acid (FOLVITE) 1 MG tablet Take 1 mg by mouth daily.    Historical Provider, MD  furosemide (LASIX) 40 MG tablet Take 40 mg by mouth daily.  Historical Provider, MD  HYDROcodone-acetaminophen (NORCO) 5-325 MG per tablet Take 1 tablet by mouth every 6 (six) hours as needed for moderate pain. Patient not taking: Reported on 01/16/2015 12/14/13   Daryll Brod, MD  hydrOXYzine (ATARAX/VISTARIL) 25 MG tablet Take 1 tablet (25 mg total) by mouth every 6 (six) hours. 01/16/15   Samantha Tripp Dowless, PA-C  insulin NPH-insulin regular (NOVOLIN 70/30) (70-30) 100 UNIT/ML injection Inject 10-30 Units into the skin daily with breakfast. Takes 30 units in the morning and 10 units at night    Historical Provider, MD  montelukast (SINGULAIR) 10 MG tablet Take 10 mg by mouth at bedtime.     Historical Provider, MD  Multiple Vitamins-Minerals (VITAMIN D3 COMPLETE PO) Take 1,500 tablets by mouth.    Historical Provider, MD  pregabalin (LYRICA) 100 MG capsule Take 100 mg by mouth 2 (two) times daily.    Historical Provider, MD  sitaGLIPtan-metformin (JANUMET) 50-1000 MG per tablet Take 1 tablet by mouth 2 (two) times daily with a meal.    Historical Provider, MD  Sulfacetamide Sodium (SODIUM SULFACETAMIDE EX) Apply 1 application topically every 6 (six) hours as needed (for itching).     Historical Provider, MD  verapamil (COVERA HS) 180 MG (CO) 24 hr tablet Take 180 mg by mouth 2 (two) times daily.    Historical Provider, MD  warfarin (COUMADIN) 5 MG tablet Take 5 mg by mouth as directed. Takes 2 tablets Thursday,friday,saturday and Sunday and 1&1/2 tablet Monday,tuesday,and wednesday    Historical Provider, MD    Family History Family History  Problem Relation Age of Onset  . Cancer Mother     colon  . Cancer Cousin     lung    Social History Social History  Substance Use Topics  . Smoking status: Never Smoker  . Smokeless tobacco: Never Used  . Alcohol use No     Allergies   Review of patient's allergies indicates no known allergies.   Review of Systems Review of Systems 10 Systems reviewed and are negative for acute change except as noted in the HPI.  Physical Exam Updated Vital Signs BP 147/79 (BP Location: Left Arm)   Pulse 100   Temp 97.9 F (36.6 C) (Oral)   Resp 22   SpO2 98%   Physical Exam  Constitutional: She is oriented to person, place, and time.  Elderly, NAD  HENT:  Right Ear: External ear normal.  Left Ear: External ear normal.  Nose: Nose normal.  Mouth/Throat: Oropharynx is clear and moist. No oropharyngeal exudate.  Eyes: Conjunctivae are normal.  Neck: Neck supple.  Cardiovascular: Normal rate, regular rhythm, normal heart sounds and intact distal pulses.   Pulmonary/Chest: Effort normal and breath sounds normal. No respiratory  distress. She has no wheezes.  Abdominal: Soft. Bowel sounds are normal. She exhibits no distension. There is tenderness. There is no rebound and no guarding.  Left flank and LLQ tenderness to deep palpation without guarding No CVA tenderness  Musculoskeletal: She exhibits no edema.  Lymphadenopathy:    She has no cervical adenopathy.  Neurological: She is alert and oriented to person, place, and time. No cranial nerve deficit.  Skin: Skin is warm and dry.  Psychiatric: She has a normal mood and affect.  Nursing note and vitals reviewed.   ED Treatments / Results  Labs (all labs ordered are listed, but only abnormal results are displayed) Labs Reviewed  COMPREHENSIVE METABOLIC PANEL - Abnormal; Notable for the following:  Result Value   Glucose, Bld 216 (*)    All other components within normal limits  CBC - Abnormal; Notable for the following:    RDW 15.6 (*)    Platelets 404 (*)    All other components within normal limits  LIPASE, BLOOD  URINALYSIS, ROUTINE W REFLEX MICROSCOPIC (NOT AT [facility])    EKG  EKG Interpretation None       Radiology Ct Abdomen Pelvis W Contrast  Result Date: 11/09/2015 CLINICAL DATA:  Left-sided abdominal pain. EXAM: CT ABDOMEN AND PELVIS WITH CONTRAST TECHNIQUE: Multidetector CT imaging of the abdomen and pelvis was performed using the standard protocol following bolus administration of intravenous contrast. CONTRAST:  167mL ISOVUE-300 IOPAMIDOL (ISOVUE-300) INJECTION 61% COMPARISON:  11/16/2013. FINDINGS: Lower chest: Subsegmental atelectasis noted in the lower lungs bilaterally. Hepatobiliary: No focal abnormality within the liver parenchyma. There is no evidence for gallstones, gallbladder wall thickening, or pericholecystic fluid. No intrahepatic or extrahepatic biliary dilation. Pancreas: No focal mass lesion. No dilatation of the main duct. No intraparenchymal cyst. No peripancreatic edema. Spleen: No splenomegaly. No focal mass lesion.  Adrenals/Urinary Tract: No adrenal nodule or mass. Kidneys are unremarkable. No evidence for hydroureter. The urinary bladder appears normal for the degree of distention. Stomach/Bowel: Stomach is nondistended. No gastric wall thickening. No evidence of outlet obstruction. Duodenum is normally positioned as is the ligament of Treitz. No small bowel wall thickening. No small bowel dilatation. The terminal ileum is normal. The appendix is normal. Diverticuli are seen scattered along the entire length of the colon without CT findings of diverticulitis. Vascular/Lymphatic: There is abdominal aortic atherosclerosis without aneurysm. There is no gastrohepatic or hepatoduodenal ligament lymphadenopathy. No intraperitoneal or retroperitoneal lymphadenopathy. Tiny celiac axis lymph nodes are unchanged in the interval. No pelvic sidewall lymphadenopathy. Reproductive: Calcified fibroids noted in the uterus. There is no adnexal mass. Other: No intraperitoneal free fluid. Musculoskeletal: Bone windows reveal no worrisome lytic or sclerotic osseous lesions. IMPRESSION: 1. No acute findings in the abdomen or pelvis. 2. Colonic diverticulosis without diverticulitis. 3. Abdominal aortic atherosclerosis. Electronically Signed   By: Misty Stanley M.D.   On: 11/09/2015 18:51    Procedures Procedures (including critical care time)  Medications Ordered in ED Medications  morphine 2 MG/ML injection (not administered)  gi cocktail (Maalox,Lidocaine,Donnatal) (30 mLs Oral Given 11/09/15 1746)  ketorolac (TORADOL) 15 MG/ML injection 15 mg (15 mg Intravenous Given 11/09/15 1746)  morphine 2 MG/ML injection 2 mg (2 mg Intravenous Given 11/09/15 1751)  iopamidol (ISOVUE-300) 61 % injection (100 mLs  Contrast Given 11/09/15 1822)  morphine 2 MG/ML injection 2 mg (2 mg Intravenous Given 11/09/15 1913)     Initial Impression / Assessment and Plan / ED Course  I have reviewed the triage vital signs and the nursing notes.  Pertinent  labs & imaging results that were available during my care of the patient were reviewed by me and considered in my medical decision making (see chart for details).  Clinical Course  Value Comment By Time  Potassium: 3.9 (Reviewed) Dorie Rank, MD 08/26 1914    Pain improved in the ED. Labs unrevealing. CT abd/pelvis was obtained due to tenderness and pt age. CT is negative. Repeat exam benign with no peritoneal signs. No n/v/d. Hemodynamically stable. Pt seen in conjunction with attending Dr. Tomi Bamberger. She is nontoxic appearing and stable. Will d/c home with instructions for close f/u with PCP after the weekend. ER return precautions given.  Final Clinical Impressions(s) / ED Diagnoses   Final diagnoses:  Left sided abdominal pain    New Prescriptions Discharge Medication List as of 11/09/2015  7:26 PM    START taking these medications   Details  dicyclomine (BENTYL) 20 MG tablet Take 1 tablet (20 mg total) by mouth 2 (two) times daily., Starting Sat 11/09/2015, Print         Anne Ng, [place] 11/09/15 1955    Dorie Rank, MD 11/09/15 2242

## 2015-11-09 NOTE — ED Notes (Signed)
Patient ambulated to restroom. One person assist on rising. Steady gait with no assistance once up.

## 2015-11-09 NOTE — Discharge Instructions (Signed)
You were seen in the emergency room today for evaluation of abdominal pain. Your bloodwork, urine test, and CT scan were normal. Please follow up with your primary care provider as soon as possible. Return to the ER for new or worsening symptoms.

## 2015-11-09 NOTE — ED Notes (Signed)
Pt transported to CT ?

## 2015-11-21 ENCOUNTER — Encounter (INDEPENDENT_AMBULATORY_CARE_PROVIDER_SITE_OTHER): Payer: Commercial Managed Care - HMO | Admitting: Ophthalmology

## 2015-11-25 ENCOUNTER — Encounter (INDEPENDENT_AMBULATORY_CARE_PROVIDER_SITE_OTHER): Payer: Commercial Managed Care - HMO | Admitting: Ophthalmology

## 2015-11-25 DIAGNOSIS — H33302 Unspecified retinal break, left eye: Secondary | ICD-10-CM

## 2015-11-25 DIAGNOSIS — E113211 Type 2 diabetes mellitus with mild nonproliferative diabetic retinopathy with macular edema, right eye: Secondary | ICD-10-CM

## 2015-11-25 DIAGNOSIS — H43813 Vitreous degeneration, bilateral: Secondary | ICD-10-CM | POA: Diagnosis not present

## 2015-11-25 DIAGNOSIS — H35372 Puckering of macula, left eye: Secondary | ICD-10-CM | POA: Diagnosis not present

## 2015-11-25 DIAGNOSIS — E11311 Type 2 diabetes mellitus with unspecified diabetic retinopathy with macular edema: Secondary | ICD-10-CM

## 2015-11-25 DIAGNOSIS — H35033 Hypertensive retinopathy, bilateral: Secondary | ICD-10-CM | POA: Diagnosis not present

## 2015-11-25 DIAGNOSIS — I10 Essential (primary) hypertension: Secondary | ICD-10-CM | POA: Diagnosis not present

## 2015-11-25 DIAGNOSIS — E113293 Type 2 diabetes mellitus with mild nonproliferative diabetic retinopathy without macular edema, bilateral: Secondary | ICD-10-CM | POA: Diagnosis not present

## 2016-01-06 DIAGNOSIS — E559 Vitamin D deficiency, unspecified: Secondary | ICD-10-CM | POA: Insufficient documentation

## 2016-01-06 DIAGNOSIS — L930 Discoid lupus erythematosus: Secondary | ICD-10-CM | POA: Insufficient documentation

## 2016-01-06 DIAGNOSIS — Z79899 Other long term (current) drug therapy: Secondary | ICD-10-CM | POA: Insufficient documentation

## 2016-01-06 DIAGNOSIS — M19042 Primary osteoarthritis, left hand: Secondary | ICD-10-CM

## 2016-01-06 DIAGNOSIS — Z96653 Presence of artificial knee joint, bilateral: Secondary | ICD-10-CM | POA: Insufficient documentation

## 2016-01-06 DIAGNOSIS — M19041 Primary osteoarthritis, right hand: Secondary | ICD-10-CM | POA: Insufficient documentation

## 2016-01-07 ENCOUNTER — Ambulatory Visit: Payer: Commercial Managed Care - HMO | Admitting: Rheumatology

## 2016-01-19 ENCOUNTER — Encounter (HOSPITAL_COMMUNITY): Payer: Self-pay | Admitting: *Deleted

## 2016-01-19 ENCOUNTER — Emergency Department (HOSPITAL_COMMUNITY)
Admission: EM | Admit: 2016-01-19 | Discharge: 2016-01-19 | Disposition: A | Discharge status: Home / Self Care | Payer: Medicare Other | Attending: Emergency Medicine | Admitting: Emergency Medicine

## 2016-01-19 DIAGNOSIS — E119 Type 2 diabetes mellitus without complications: Secondary | ICD-10-CM | POA: Diagnosis not present

## 2016-01-19 DIAGNOSIS — S161XXA Strain of muscle, fascia and tendon at neck level, initial encounter: Secondary | ICD-10-CM | POA: Insufficient documentation

## 2016-01-19 DIAGNOSIS — I10 Essential (primary) hypertension: Secondary | ICD-10-CM | POA: Insufficient documentation

## 2016-01-19 DIAGNOSIS — Z7901 Long term (current) use of anticoagulants: Secondary | ICD-10-CM | POA: Insufficient documentation

## 2016-01-19 DIAGNOSIS — Y92009 Unspecified place in unspecified non-institutional (private) residence as the place of occurrence of the external cause: Secondary | ICD-10-CM | POA: Diagnosis not present

## 2016-01-19 DIAGNOSIS — S46819A Strain of other muscles, fascia and tendons at shoulder and upper arm level, unspecified arm, initial encounter: Secondary | ICD-10-CM

## 2016-01-19 DIAGNOSIS — Z794 Long term (current) use of insulin: Secondary | ICD-10-CM | POA: Insufficient documentation

## 2016-01-19 DIAGNOSIS — X500XXA Overexertion from strenuous movement or load, initial encounter: Secondary | ICD-10-CM | POA: Diagnosis not present

## 2016-01-19 DIAGNOSIS — Y9389 Activity, other specified: Secondary | ICD-10-CM | POA: Diagnosis not present

## 2016-01-19 DIAGNOSIS — S199XXA Unspecified injury of neck, initial encounter: Secondary | ICD-10-CM | POA: Diagnosis present

## 2016-01-19 DIAGNOSIS — Y999 Unspecified external cause status: Secondary | ICD-10-CM | POA: Diagnosis not present

## 2016-01-19 DIAGNOSIS — Z7984 Long term (current) use of oral hypoglycemic drugs: Secondary | ICD-10-CM | POA: Diagnosis not present

## 2016-01-19 DIAGNOSIS — Z96659 Presence of unspecified artificial knee joint: Secondary | ICD-10-CM | POA: Diagnosis not present

## 2016-01-19 MED ORDER — OXYCODONE-ACETAMINOPHEN 5-325 MG PO TABS: 1.0000 | ORAL_TABLET | Freq: Four times a day (QID) | ORAL | 0 refills | Status: DC | PRN

## 2016-01-19 MED ORDER — METHOCARBAMOL 500 MG PO TABS: 500.0000 mg | ORAL_TABLET | Freq: Three times a day (TID) | ORAL | 0 refills | Status: DC | PRN

## 2016-01-19 MED ORDER — METHOCARBAMOL 500 MG PO TABS
1000.0000 mg | ORAL_TABLET | Freq: Once | ORAL | Status: AC
  Administered 2016-01-19: 1000 mg via ORAL
  Filled 2016-01-19: qty 2

## 2016-01-19 MED ORDER — OXYCODONE-ACETAMINOPHEN 5-325 MG PO TABS
1.0000 | ORAL_TABLET | Freq: Once | ORAL | Status: AC
Start: 2016-01-19 — End: 2016-01-19
  Administered 2016-01-19: 1 via ORAL
  Filled 2016-01-19: qty 1

## 2016-01-19 NOTE — ED Provider Notes (Signed)
Complains of pain over bilateral posterior shoulders and upper back worse with moving her arms for the past 3 days. No weakness no numbness patient recently moving furniture. On exam she is in no distress alert Glasgow Coma Score 15 HEENT exam no facial asymmetry neck supple no tenderness no bruit neurologic motor strength 5 over 5 overall cranial nerves II through XII grossly intact. Gait normal extremities with full range of motion. Suspect muscle strain   Orlie Dakin, MD 01/19/16 [phone]

## 2016-01-19 NOTE — ED Provider Notes (Signed)
[facility] DEPT Provider Note   CSN: [id number] Arrival date & time: 01/19/16  0730     History   Chief Complaint Chief Complaint  Patient presents with  . Neck Pain    HPI Kaitlyn Jackson is a 70 y.o. female with a history of hypertension, diabetes, obesity, presents to the emergency department noting recent increase in physical activity moving furniture and things around her house and presents to the emergency department noting a three-day history of bilateral neck pain, in her trapezius musculature. She states that her pain is achy and worse with movement of her arms and neck and direct palpation. Patient states that she took a Tylenol a hydrocodone earlier this morning to limited effect to her symptoms. She denies any falls, trauma to the area. She states that the onset of her symptoms has been gradual and did not start immediately after her increased physical activity, but a day or two later. She denies any fever, chills, headache, numbness, weakness, radiculopathy symptoms radiating down her arms, chest pain, shortness of breath, nausea, vomiting, abdominal pain.   She states that she has taken Robaxin previously after her knee surgery and had good results with that. She states that she does not have any more pain medication at home.   HPI  Past Medical History:  Diagnosis Date  . Arthritis   . Clotting disorder ([place])   . Diabetes mellitus   . GERD (gastroesophageal reflux disease)   . Heart murmur   . Hypertension   . Mitral valve prolapse   . Sleep apnea    does not use every night- CPAP    Patient Active Problem List   Diagnosis Date Noted  . 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 High risk medication use 01/06/2016  . 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 Vitamin D deficiency 01/06/2016  . Osteopenia 03/14/2012  . Diabetes mellitus type 2 in obese ([place]) 03/14/2012  . ASCVD  (arteriosclerotic cardiovascular disease) 03/14/2012  . Hypertension 03/14/2012    Past Surgical History:  Procedure Laterality Date  . BACK SURGERY     lower - removed  cysts  . REPLACEMENT TOTAL KNEE  2008-2009  . TRIGGER FINGER RELEASE Right 12/14/2013   Procedure: RELEASE TRIGGER FINGER/A-1 PULLEY RIGHT RING FINGER;  Surgeon: Daryll Brod, MD;  Location: [facility];  Service: Orthopedics;  Laterality: Right;    OB History    No data available       Home Medications    Prior to Admission medications   Medication Sig Start Date End Date Taking? Authorizing Provider  acetaminophen (TYLENOL) 500 MG tablet Take 500 mg by mouth every 6 (six) hours as needed for moderate pain.    Historical Provider, MD  atorvastatin (LIPITOR) 20 MG tablet Take 10 mg by mouth at bedtime.    Historical Provider, MD  benazepril (LOTENSIN) 40 MG tablet Take 40 mg by mouth daily.    Historical Provider, MD  calamine lotion Apply 1 application topically as needed for itching.    Historical Provider, MD  chlorpheniramine (CHLOR-TRIMETON) 4 MG tablet Take 4 mg by mouth 2 (two) times daily as needed for allergies.    Historical Provider, MD  cholecalciferol (VITAMIN D) 1000 UNITS tablet Take 1,000 Units by mouth daily.    Historical Provider, MD  desonide (DESOWEN) 0.05 % lotion Apply 1 application topically 2 (two) times daily.    Historical Provider, MD  dicyclomine (BENTYL) 20 MG  tablet Take 1 tablet (20 mg total) by mouth 2 (two) times daily. 11/09/15   Olivia Canter Sam, PA-C  esomeprazole (NEXIUM) 40 MG capsule Take 40 mg by mouth daily before breakfast.    Historical Provider, MD  fexofenadine (ALLEGRA) 180 MG tablet Take 180 mg by mouth daily.    Historical Provider, MD  fluticasone (FLONASE) 50 MCG/ACT nasal spray Place 1 spray into both nostrils daily.    Historical Provider, MD  folic acid (FOLVITE) 1 MG tablet Take 1 mg by mouth daily.    Historical Provider, MD  furosemide (LASIX) 40 MG  tablet Take 40 mg by mouth daily.    Historical Provider, MD  HYDROcodone-acetaminophen (NORCO) 5-325 MG per tablet Take 1 tablet by mouth every 6 (six) hours as needed for moderate pain. Patient not taking: Reported on 01/16/2015 12/14/13   Daryll Brod, MD  hydrOXYzine (ATARAX/VISTARIL) 25 MG tablet Take 1 tablet (25 mg total) by mouth every 6 (six) hours. 01/16/15   Samantha Tripp Dowless, PA-C  insulin NPH-insulin regular (NOVOLIN 70/30) (70-30) 100 UNIT/ML injection Inject 10-30 Units into the skin daily with breakfast. Takes 30 units in the morning and 10 units at night    Historical Provider, MD  methocarbamol (ROBAXIN) 500 MG tablet Take 1 tablet (500 mg total) by mouth every 8 (eight) hours as needed for muscle spasms. 01/19/16   Zenovia Jarred, DO  montelukast (SINGULAIR) 10 MG tablet Take 10 mg by mouth at bedtime.    Historical Provider, MD  Multiple Vitamins-Minerals (VITAMIN D3 COMPLETE PO) Take 1,500 tablets by mouth.    Historical Provider, MD  oxyCODONE-acetaminophen (PERCOCET/ROXICET) 5-325 MG tablet Take 1 tablet by mouth every 6 (six) hours as needed for severe pain. 01/19/16   Zenovia Jarred, DO  pregabalin (LYRICA) 100 MG capsule Take 100 mg by mouth 2 (two) times daily.    Historical Provider, MD  sitaGLIPtan-metformin (JANUMET) 50-1000 MG per tablet Take 1 tablet by mouth 2 (two) times daily with a meal.    Historical Provider, MD  Sulfacetamide Sodium (SODIUM SULFACETAMIDE EX) Apply 1 application topically every 6 (six) hours as needed (for itching).     Historical Provider, MD  verapamil (COVERA HS) 180 MG (CO) 24 hr tablet Take 180 mg by mouth 2 (two) times daily.    Historical Provider, MD  warfarin (COUMADIN) 5 MG tablet Take 5 mg by mouth as directed. Takes 2 tablets Thursday,friday,saturday and Sunday and 1&1/2 tablet Monday,tuesday,and wednesday    Historical Provider, MD    Family History Family History  Problem Relation Age of Onset  . Cancer Mother     colon  . Cancer  Cousin     lung    Social History Social History  Substance Use Topics  . Smoking status: Never Smoker  . Smokeless tobacco: Never Used  . Alcohol use No     Allergies   Patient has no known allergies.   Review of Systems Review of Systems  Constitutional: Positive for activity change. Negative for chills and fever.  HENT: Negative for congestion, rhinorrhea, sinus pain, sinus pressure, sneezing and sore throat.   Respiratory: Negative for cough, chest tightness and shortness of breath.   Cardiovascular: Negative for chest pain and palpitations.  Gastrointestinal: Negative for abdominal pain, nausea and vomiting.  Genitourinary: Negative for difficulty urinating and dysuria.  Musculoskeletal: Positive for back pain, neck pain and neck stiffness. Negative for arthralgias, gait problem and joint swelling.  Skin: Negative for rash.  Neurological: Negative  for dizziness, syncope, weakness, light-headedness, numbness and headaches.  All other systems reviewed and are negative.    Physical Exam Updated Vital Signs BP 129/67 (BP Location: Left Arm)   Pulse 83   Temp 98.3 F (36.8 C) (Oral)   Resp 14   SpO2 96%   Physical Exam  Constitutional: She is oriented to person, place, and time. She appears well-developed and well-nourished. No distress.  HENT:  Head: Normocephalic and atraumatic.  Nose: Nose normal.  Mouth/Throat: Oropharynx is clear and moist.  Eyes: Conjunctivae and EOM are normal. Pupils are equal, round, and reactive to light.  Neck: Normal range of motion. Neck supple. No tracheal deviation present.  Negative spurling's b/l. No bruit noted  Cardiovascular: Normal rate, regular rhythm, normal heart sounds and intact distal pulses.   Pulmonary/Chest: Effort normal and breath sounds normal.  Abdominal: Soft. She exhibits no distension. There is no tenderness.  Musculoskeletal: She exhibits tenderness. She exhibits no edema.       Cervical back: She exhibits  tenderness, pain and spasm. She exhibits normal range of motion and no bony tenderness.       Back:  No midline C/T/L spine tenderness, crepitus, deformity  Lymphadenopathy:    She has no cervical adenopathy.  Neurological: She is alert and oriented to person, place, and time. She has normal strength. No cranial nerve deficit or sensory deficit. Coordination and gait normal. GCS eye subscore is 4. GCS verbal subscore is 5. GCS motor subscore is 6.  Skin: Skin is warm and dry. No rash noted. She is not diaphoretic.  Nursing note and vitals reviewed.    ED Treatments / Results  Labs (all labs ordered are listed, but only abnormal results are displayed) Labs Reviewed - No data to display  EKG  EKG Interpretation None       Radiology No results found.  Procedures Procedures (including critical care time)  Medications Ordered in ED Medications  oxyCODONE-acetaminophen (PERCOCET/ROXICET) 5-325 MG per tablet 1 tablet (1 tablet Oral Given 01/19/16 0840)  methocarbamol (ROBAXIN) tablet 1,000 mg (1,000 mg Oral Given 01/19/16 0840)     Initial Impression / Assessment and Plan / ED Course  I have reviewed the triage vital signs and the nursing notes.  Pertinent labs & imaging results that were available during my care of the patient were reviewed by me and considered in my medical decision making (see chart for details).  Clinical Course    70 year old female presents with bilateral trapezius strain after increased physical activity at home. Exam as above with no concerning findings for radiculopathy or evidence of trauma. She was given a dose of Robaxin and premedication in the ED and was prescribed the same. She was recommended to use heating pads and massage and rest for further care of her symptoms. She was recommended to follow up close it with her primary care physician in the next few days to monitor for improvement in her symptoms. This plan was discussed with the patient and  her husband at the bedside and they stated both understanding and agreement.  Final Clinical Impressions(s) / ED Diagnoses   Final diagnoses:  Trapezius muscle strain, unspecified laterality, initial encounter    New Prescriptions New Prescriptions   METHOCARBAMOL (ROBAXIN) 500 MG TABLET    Take 1 tablet (500 mg total) by mouth every 8 (eight) hours as needed for muscle spasms.   OXYCODONE-ACETAMINOPHEN (PERCOCET/ROXICET) 5-325 MG TABLET    Take 1 tablet by mouth every 6 (six) hours as  needed for severe pain.       Zenovia Jarred, DO 01/19/16 [place], MD 01/19/16 [phone]

## 2016-01-19 NOTE — ED Triage Notes (Signed)
Pt reports neck pain x 2 days, unsure of any injury. Pain increases with movement.

## 2016-01-23 DIAGNOSIS — E119 Type 2 diabetes mellitus without complications: Secondary | ICD-10-CM | POA: Diagnosis not present

## 2016-01-23 DIAGNOSIS — I10 Essential (primary) hypertension: Secondary | ICD-10-CM | POA: Diagnosis not present

## 2016-01-23 DIAGNOSIS — M150 Primary generalized (osteo)arthritis: Secondary | ICD-10-CM | POA: Diagnosis not present

## 2016-02-17 ENCOUNTER — Encounter (INDEPENDENT_AMBULATORY_CARE_PROVIDER_SITE_OTHER): Payer: Medicare Other | Admitting: Ophthalmology

## 2016-02-17 DIAGNOSIS — E11311 Type 2 diabetes mellitus with unspecified diabetic retinopathy with macular edema: Secondary | ICD-10-CM | POA: Diagnosis not present

## 2016-02-17 DIAGNOSIS — H35033 Hypertensive retinopathy, bilateral: Secondary | ICD-10-CM

## 2016-02-17 DIAGNOSIS — H33302 Unspecified retinal break, left eye: Secondary | ICD-10-CM | POA: Diagnosis not present

## 2016-02-17 DIAGNOSIS — H43813 Vitreous degeneration, bilateral: Secondary | ICD-10-CM | POA: Diagnosis not present

## 2016-02-17 DIAGNOSIS — I10 Essential (primary) hypertension: Secondary | ICD-10-CM | POA: Diagnosis not present

## 2016-02-17 DIAGNOSIS — E113213 Type 2 diabetes mellitus with mild nonproliferative diabetic retinopathy with macular edema, bilateral: Secondary | ICD-10-CM

## 2016-02-25 DIAGNOSIS — E119 Type 2 diabetes mellitus without complications: Secondary | ICD-10-CM | POA: Diagnosis not present

## 2016-02-25 DIAGNOSIS — Z5181 Encounter for therapeutic drug level monitoring: Secondary | ICD-10-CM | POA: Diagnosis not present

## 2016-02-25 DIAGNOSIS — M150 Primary generalized (osteo)arthritis: Secondary | ICD-10-CM | POA: Diagnosis not present

## 2016-02-25 DIAGNOSIS — I10 Essential (primary) hypertension: Secondary | ICD-10-CM | POA: Diagnosis not present

## 2016-03-01 ENCOUNTER — Emergency Department (HOSPITAL_COMMUNITY): Payer: Medicare Other

## 2016-03-01 ENCOUNTER — Emergency Department (HOSPITAL_COMMUNITY)
Admission: EM | Admit: 2016-03-01 | Discharge: 2016-03-01 | Disposition: A | Discharge status: Home / Self Care | Payer: Medicare Other | Attending: Emergency Medicine | Admitting: Emergency Medicine

## 2016-03-01 ENCOUNTER — Encounter (HOSPITAL_COMMUNITY): Payer: Self-pay | Admitting: Emergency Medicine

## 2016-03-01 DIAGNOSIS — Y939 Activity, unspecified: Secondary | ICD-10-CM | POA: Diagnosis not present

## 2016-03-01 DIAGNOSIS — Y999 Unspecified external cause status: Secondary | ICD-10-CM | POA: Insufficient documentation

## 2016-03-01 DIAGNOSIS — X500XXA Overexertion from strenuous movement or load, initial encounter: Secondary | ICD-10-CM | POA: Diagnosis not present

## 2016-03-01 DIAGNOSIS — Z794 Long term (current) use of insulin: Secondary | ICD-10-CM | POA: Diagnosis not present

## 2016-03-01 DIAGNOSIS — M47816 Spondylosis without myelopathy or radiculopathy, lumbar region: Secondary | ICD-10-CM | POA: Diagnosis not present

## 2016-03-01 DIAGNOSIS — Z7901 Long term (current) use of anticoagulants: Secondary | ICD-10-CM | POA: Insufficient documentation

## 2016-03-01 DIAGNOSIS — Z96653 Presence of artificial knee joint, bilateral: Secondary | ICD-10-CM | POA: Insufficient documentation

## 2016-03-01 DIAGNOSIS — Y92009 Unspecified place in unspecified non-institutional (private) residence as the place of occurrence of the external cause: Secondary | ICD-10-CM | POA: Diagnosis not present

## 2016-03-01 DIAGNOSIS — M545 Low back pain, unspecified: Secondary | ICD-10-CM

## 2016-03-01 DIAGNOSIS — Z79899 Other long term (current) drug therapy: Secondary | ICD-10-CM | POA: Diagnosis not present

## 2016-03-01 DIAGNOSIS — E119 Type 2 diabetes mellitus without complications: Secondary | ICD-10-CM | POA: Diagnosis not present

## 2016-03-01 DIAGNOSIS — I10 Essential (primary) hypertension: Secondary | ICD-10-CM | POA: Diagnosis not present

## 2016-03-01 MED ORDER — TRAMADOL HCL 50 MG PO TABS: 100.0000 mg | ORAL_TABLET | Freq: Four times a day (QID) | ORAL | 0 refills | Status: DC | PRN

## 2016-03-01 MED ORDER — CYCLOBENZAPRINE HCL 10 MG PO TABS
10.0000 mg | ORAL_TABLET | Freq: Once | ORAL | Status: AC
  Administered 2016-03-01: 10 mg via ORAL
  Filled 2016-03-01: qty 1

## 2016-03-01 MED ORDER — HYDROCODONE-ACETAMINOPHEN 5-325 MG PO TABS
2.0000 | ORAL_TABLET | Freq: Once | ORAL | Status: AC
  Administered 2016-03-01: 2 via ORAL
  Filled 2016-03-01: qty 2

## 2016-03-01 NOTE — ED Provider Notes (Signed)
[facility] DEPT Provider Note   CSN: [id number] Arrival date & time: 03/01/16  1352 By signing my name below, I, Georgette Shell, attest that this documentation has been prepared under the direction and in the presence of Gareth Morgan, MD. Electronically Signed: Georgette Shell, ED Scribe. 03/01/16. Marland Kitchen  History   Chief Complaint Chief Complaint  Patient presents with  . Back Pain   HPI Comments: Kaitlyn Jackson is a 70 y.o. female with h/o arthritis and HTN who presents to the Emergency Department complaining of acute on chronic, lower back pain and left hip pain onset a couple weeks ago. Pt denies any recent injury or trauma, but notes that she was doing some heavy lifting at home 1 month ago. Her pain is exacerbated with movement and ambulating. She is ambulatory with a cane. She has tried heating pads, oxycodone, and muscle relaxants with minimal relief. She has also tried hydrocodone with significant relief. Pt has h/o back pain and has seen an orthopedic spine doctor who has done procedures for pain management but she notes that her symptoms have worsened these past couple weeks; she has yet to follow up with him at this time. Pt is on blood thinners. Pt denies fever, chills, dysuria, urinary/bowel incontinence, hematuria, or any other associated symptoms.   Orthopedic: Ernestina Patches  The history is provided by the patient. No language interpreter was used.    Past Medical History:  Diagnosis Date  . Arthritis   . Clotting disorder ([place])   . Diabetes mellitus   . GERD (gastroesophageal reflux disease)   . Heart murmur   . Hypertension   . Mitral valve prolapse   . Sleep apnea    does not use every night- CPAP    Patient Active Problem List   Diagnosis Date Noted  . 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 High risk medication use 01/06/2016  . 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 Vitamin D deficiency 01/06/2016   . Osteopenia 03/14/2012  . Diabetes mellitus type 2 in obese ([place]) 03/14/2012  . 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 Hypertension 03/14/2012    Past Surgical History:  Procedure Laterality Date  . BACK SURGERY     lower - removed  cysts  . REPLACEMENT TOTAL KNEE  2008-2009  . TRIGGER FINGER RELEASE Right 12/14/2013   Procedure: RELEASE TRIGGER FINGER/A-1 PULLEY RIGHT RING FINGER;  Surgeon: Daryll Brod, MD;  Location: [facility];  Service: Orthopedics;  Laterality: Right;    OB History    No data available       Home Medications    Prior to Admission medications   Medication Sig Start Date End Date Taking? Authorizing Provider  acetaminophen (TYLENOL) 500 MG tablet Take 500 mg by mouth every 6 (six) hours as needed for moderate pain.    Historical Provider, MD  atorvastatin (LIPITOR) 20 MG tablet Take 10 mg by mouth at bedtime.    Historical Provider, MD  benazepril (LOTENSIN) 40 MG tablet Take 40 mg by mouth daily.    Historical Provider, MD  calamine lotion Apply 1 application topically as needed for itching.    Historical Provider, MD  chlorpheniramine (CHLOR-TRIMETON) 4 MG tablet Take 4 mg by mouth 2 (two) times daily as needed for allergies.    Historical Provider, MD  cholecalciferol (VITAMIN D) 1000 UNITS tablet Take 1,000 Units by mouth daily.    Historical Provider, MD  desonide (DESOWEN) 0.05 % lotion  Apply 1 application topically 2 (two) times daily.    Historical Provider, MD  dicyclomine (BENTYL) 20 MG tablet Take 1 tablet (20 mg total) by mouth 2 (two) times daily. 11/09/15   Olivia Canter Sam, PA-C  esomeprazole (NEXIUM) 40 MG capsule Take 40 mg by mouth daily before breakfast.    Historical Provider, MD  fexofenadine (ALLEGRA) 180 MG tablet Take 180 mg by mouth daily.    Historical Provider, MD  fluticasone (FLONASE) 50 MCG/ACT nasal spray Place 1 spray into both nostrils daily.    Historical Provider, MD  folic acid (FOLVITE) 1 MG  tablet Take 1 mg by mouth daily.    Historical Provider, MD  furosemide (LASIX) 40 MG tablet Take 40 mg by mouth daily.    Historical Provider, MD  HYDROcodone-acetaminophen (NORCO) 5-325 MG per tablet Take 1 tablet by mouth every 6 (six) hours as needed for moderate pain. Patient not taking: Reported on 01/16/2015 12/14/13   Daryll Brod, MD  hydrOXYzine (ATARAX/VISTARIL) 25 MG tablet Take 1 tablet (25 mg total) by mouth every 6 (six) hours. 01/16/15   Samantha Tripp Dowless, PA-C  insulin NPH-insulin regular (NOVOLIN 70/30) (70-30) 100 UNIT/ML injection Inject 10-30 Units into the skin daily with breakfast. Takes 30 units in the morning and 10 units at night    Historical Provider, MD  methocarbamol (ROBAXIN) 500 MG tablet Take 1 tablet (500 mg total) by mouth every 8 (eight) hours as needed for muscle spasms. 01/19/16   Zenovia Jarred, DO  montelukast (SINGULAIR) 10 MG tablet Take 10 mg by mouth at bedtime.    Historical Provider, MD  Multiple Vitamins-Minerals (VITAMIN D3 COMPLETE PO) Take 1,500 tablets by mouth.    Historical Provider, MD  oxyCODONE-acetaminophen (PERCOCET/ROXICET) 5-325 MG tablet Take 1 tablet by mouth every 6 (six) hours as needed for severe pain. 01/19/16   Zenovia Jarred, DO  pregabalin (LYRICA) 100 MG capsule Take 100 mg by mouth 2 (two) times daily.    Historical Provider, MD  sitaGLIPtan-metformin (JANUMET) 50-1000 MG per tablet Take 1 tablet by mouth 2 (two) times daily with a meal.    Historical Provider, MD  Sulfacetamide Sodium (SODIUM SULFACETAMIDE EX) Apply 1 application topically every 6 (six) hours as needed (for itching).     Historical Provider, MD  traMADol (ULTRAM) 50 MG tablet Take 2 tablets (100 mg total) by mouth every 6 (six) hours as needed. 03/01/16   Gareth Morgan, MD  verapamil (COVERA HS) 180 MG (CO) 24 hr tablet Take 180 mg by mouth 2 (two) times daily.    Historical Provider, MD  warfarin (COUMADIN) 5 MG tablet Take 5 mg by mouth as directed. Takes 2  tablets Thursday,friday,saturday and Sunday and 1&1/2 tablet Monday,tuesday,and wednesday    Historical Provider, MD    Family History Family History  Problem Relation Age of Onset  . Cancer Mother     colon  . Cancer Cousin     lung    Social History Social History  Substance Use Topics  . Smoking status: Never Smoker  . Smokeless tobacco: Never Used  . Alcohol use No     Allergies   Patient has no known allergies.   Review of Systems Review of Systems  Constitutional: Negative for chills and fever.  HENT: Negative for sore throat.   Eyes: Negative for pain and visual disturbance.  Respiratory: Negative for cough and shortness of breath.   Cardiovascular: Negative for chest pain.  Gastrointestinal: Negative for abdominal pain, nausea  and vomiting.  Genitourinary: Negative for difficulty urinating, dysuria and hematuria.  Musculoskeletal: Positive for arthralgias, back pain and myalgias.  Skin: Negative for color change and rash.  Neurological: Negative for syncope, weakness and numbness.  All other systems reviewed and are negative.    Physical Exam Updated Vital Signs BP (!) 136/53 (BP Location: Left Arm)   Pulse 84   Temp 98 F (36.7 C) (Oral)   Resp 18   SpO2 96%   Physical Exam  Constitutional: She is oriented to person, place, and time. She appears well-developed and well-nourished. No distress.  HENT:  Head: Normocephalic and atraumatic.  Eyes: Conjunctivae and EOM are normal.  Neck: Normal range of motion.  Cardiovascular: Normal rate, regular rhythm, normal heart sounds and intact distal pulses.  Exam reveals no gallop and no friction rub.   No murmur heard. Pulmonary/Chest: Effort normal and breath sounds normal. No respiratory distress. She has no wheezes. She has no rales.  Abdominal: Soft. She exhibits no distension. There is no tenderness. There is no guarding.  Musculoskeletal: Normal range of motion. She exhibits tenderness. She exhibits no  edema.  5/5 strength in BLEs. Great hip flexion and knee flexion/extension, plantarflexion, and dorsiflexion. Lumbar tenderness mildline and left paraspinal.  Neurological: She is alert and oriented to person, place, and time.  Skin: Skin is warm and dry. No rash noted. She is not diaphoretic. No erythema.  Psychiatric: She has a normal mood and affect. Her behavior is normal.  Nursing note and vitals reviewed.    ED Treatments / Results  DIAGNOSTIC STUDIES: Oxygen Saturation is 98% on RA, normal by my interpretation.    COORDINATION OF CARE: 2:16 PM Discussed treatment plan with pt at bedside which includes x-ray and pt agreed to plan.  Labs (all labs ordered are listed, but only abnormal results are displayed) Labs Reviewed - No data to display  EKG  EKG Interpretation None       Radiology Dg Lumbar Spine Complete  Result Date: 03/01/2016 CLINICAL DATA:  Worsening left lower back pain for the past 2-3 weeks, radiating down the left leg and into the left buttock. EXAM: LUMBAR SPINE - COMPLETE 4+ VIEW COMPARISON:  Abdomen and pelvis CT dated 11/09/2015. FINDINGS: Five non-rib-bearing lumbar vertebrae. Mild levoconvex lumbar rotary scoliosis. Mild anterior spur formation at multiple levels of the lumbar spine. Moderate anterior spur formation, disc space narrowing and discogenic sclerosis at the T11-12 level. Interval mild anterolisthesis at the L4-5 level. Facet degenerative changes at that level. No fractures or pars defects. IMPRESSION: Degenerative changes, as described above. Electronically Signed   By: Claudie Revering M.D.   On: 03/01/2016 15:41    Procedures Procedures (including critical care time)  Medications Ordered in ED Medications  HYDROcodone-acetaminophen (NORCO/VICODIN) 5-325 MG per tablet 2 tablet (2 tablets Oral Given 03/01/16 1432)  cyclobenzaprine (FLEXERIL) tablet 10 mg (10 mg Oral Given 03/01/16 1432)     Initial Impression / Assessment and Plan / ED  Course  I have reviewed the triage vital signs and the nursing notes.  Pertinent labs & imaging results that were available during my care of the patient were reviewed by me and considered in my medical decision making (see chart for details).  Clinical Course    70 year old female with a history of diabetes, hypertension, discoid lupus, arthritis, back pain who presents with concern for left-sided low back pain with radiation into the left hip.  X-ray was done to evaluate for new compression fractures and showed  degenerative changes. Patient denies abdominal pain pain, is afebrile, no urinary symptoms, no CVA tenderness, and have low suspicion for urinary tract infection or nephrolithiasis. Doubt intra-abdominal cause of back pain. Patient is neurovascularly intact, with good strength and sensation, no red flags of back pain, doubt cauda equina, epidural abscess or osteomyelitis.  Patient was given Flexeril and Norco for pain in the emergency department.  Discussed that further narcotic medications need to be prescribed by PCP and rec follow up again with spinal doctor given per prior relief with injections.  Patient received  10 tablets Percocet on November 5, , and November 9 15 tablets per database.  Will give small rx for tramadol. Patient discharged in stable condition with understanding of reasons to return.   Final Clinical Impressions(s) / ED Diagnoses   Final diagnoses:  Acute left-sided low back pain without sciatica    New Prescriptions Discharge Medication List as of 03/01/2016  4:16 PM    START taking these medications   Details  traMADol (ULTRAM) 50 MG tablet Take 2 tablets (100 mg total) by mouth every 6 (six) hours as needed., Starting Sun 03/01/2016, Print       I personally performed the services described in this documentation, which was scribed in my presence. The recorded information has been reviewed and is accurate.     Gareth Morgan, MD 03/01/16 [phone]

## 2016-03-01 NOTE — ED Notes (Signed)
Patient transported to X-ray 

## 2016-03-01 NOTE — ED Triage Notes (Signed)
Pt sts lower back pain and left hip and leg pain x 1 month since moving stuff in her home

## 2016-03-01 NOTE — ED Notes (Signed)
Pain in lower left side radiating to left buttock.  States pain increases with movement, "trying to get up" or "trying to turn over."

## 2016-03-03 DIAGNOSIS — E119 Type 2 diabetes mellitus without complications: Secondary | ICD-10-CM | POA: Diagnosis not present

## 2016-03-03 DIAGNOSIS — M549 Dorsalgia, unspecified: Secondary | ICD-10-CM | POA: Diagnosis not present

## 2016-03-03 DIAGNOSIS — I10 Essential (primary) hypertension: Secondary | ICD-10-CM | POA: Diagnosis not present

## 2016-03-03 DIAGNOSIS — M150 Primary generalized (osteo)arthritis: Secondary | ICD-10-CM | POA: Diagnosis not present

## 2016-03-17 ENCOUNTER — Telehealth (INDEPENDENT_AMBULATORY_CARE_PROVIDER_SITE_OTHER): Payer: Self-pay | Admitting: Physical Medicine and Rehabilitation

## 2016-03-19 NOTE — Telephone Encounter (Signed)
Call routed to medical records.

## 2016-03-23 NOTE — Telephone Encounter (Signed)
RECORDS TO BE FAXED

## 2016-04-22 ENCOUNTER — Encounter: Payer: Self-pay | Admitting: Podiatry

## 2016-04-22 ENCOUNTER — Ambulatory Visit (INDEPENDENT_AMBULATORY_CARE_PROVIDER_SITE_OTHER): Payer: Medicare Other | Admitting: Podiatry

## 2016-04-22 VITALS — BP 141/75 | HR 70 | Resp 18

## 2016-04-22 DIAGNOSIS — L608 Other nail disorders: Secondary | ICD-10-CM | POA: Diagnosis not present

## 2016-04-22 DIAGNOSIS — E119 Type 2 diabetes mellitus without complications: Secondary | ICD-10-CM

## 2016-04-22 NOTE — Patient Instructions (Signed)

## 2016-04-22 NOTE — Progress Notes (Signed)
   Subjective:    Patient ID: Kaitlyn Jackson, female    DOB: 1945/05/28, 71 y.o.   MRN: [id number]  HPI     This patient presents today requesting trimming of her toenails. Patient describes repetitive podiatric care visit estimated per patient last 2-3 months. She describes repetitive trimming of toenails over multiple years. Patient states that she has neuropathy and complains of coldness in her feet.   Patient has a 20 year history of diabetes and denies any history of foot ulceration, claudication or amputation Patient denies smoking history    Review of Systems  HENT: Positive for sinus pressure.   Respiratory: Positive for cough and wheezing.   Gastrointestinal: Positive for constipation.  Neurological: Positive for headaches.  All other systems reviewed and are negative.      Objective:   Physical Exam   Orientated 3  Patient states she is approximately 5 feet 4 inches and weighs approximately 230 pounds  Vascular: No peripheral edema or calf tenderness bilaterally DP and PT pulses 2/4 bilaterally Capillary reflex immediate bilaterally  Neurological: Sensation to 10 g monofilament wire intact 4/5 right and 5/5 left Vibratory sensation reactive bilaterally Ankle reflexes reactive bilaterally  Dermatological No open skin lesions bilaterally The toenails are elongated incurvated with occasional texture and color changes 6-10  Musculoskeletal: Pes planus bilaterally Bunion S bilaterally Medial transverse drifting toes 2-5 bilaterally Manual motor testing dorsi flexion, plantar flexion, inversion, eversion 5/5 bilaterally       Assessment & Plan:   Assessment: Diabetic with a history of peripheral neuropathy Incurvated toenails 6-10  Plan: I reviewed the results of the exam with patient today and offered her debridement of toenails and she verbally consents. The toenails 6-10 or debrided mechanically and electronically without any  bleeding  Reappoint 3 months

## 2016-05-25 ENCOUNTER — Encounter (INDEPENDENT_AMBULATORY_CARE_PROVIDER_SITE_OTHER): Payer: Medicare Other | Admitting: Ophthalmology

## 2016-06-04 ENCOUNTER — Encounter (INDEPENDENT_AMBULATORY_CARE_PROVIDER_SITE_OTHER): Payer: Medicare Other | Admitting: Ophthalmology

## 2016-06-04 DIAGNOSIS — H35033 Hypertensive retinopathy, bilateral: Secondary | ICD-10-CM

## 2016-06-04 DIAGNOSIS — E113293 Type 2 diabetes mellitus with mild nonproliferative diabetic retinopathy without macular edema, bilateral: Secondary | ICD-10-CM | POA: Diagnosis not present

## 2016-06-04 DIAGNOSIS — H43813 Vitreous degeneration, bilateral: Secondary | ICD-10-CM | POA: Diagnosis not present

## 2016-06-04 DIAGNOSIS — H33302 Unspecified retinal break, left eye: Secondary | ICD-10-CM

## 2016-06-04 DIAGNOSIS — I10 Essential (primary) hypertension: Secondary | ICD-10-CM

## 2016-06-04 DIAGNOSIS — E11319 Type 2 diabetes mellitus with unspecified diabetic retinopathy without macular edema: Secondary | ICD-10-CM

## 2016-07-14 ENCOUNTER — Emergency Department (HOSPITAL_COMMUNITY)
Admission: EM | Admit: 2016-07-14 | Discharge: 2016-07-14 | Disposition: A | Discharge status: Home / Self Care | Payer: Medicare Other | Attending: Physician Assistant | Admitting: Physician Assistant

## 2016-07-14 ENCOUNTER — Emergency Department (HOSPITAL_COMMUNITY): Payer: Medicare Other

## 2016-07-14 ENCOUNTER — Encounter (HOSPITAL_COMMUNITY): Payer: Self-pay | Admitting: Emergency Medicine

## 2016-07-14 DIAGNOSIS — I10 Essential (primary) hypertension: Secondary | ICD-10-CM | POA: Diagnosis not present

## 2016-07-14 DIAGNOSIS — Z7901 Long term (current) use of anticoagulants: Secondary | ICD-10-CM | POA: Insufficient documentation

## 2016-07-14 DIAGNOSIS — Z96653 Presence of artificial knee joint, bilateral: Secondary | ICD-10-CM | POA: Insufficient documentation

## 2016-07-14 DIAGNOSIS — Z79899 Other long term (current) drug therapy: Secondary | ICD-10-CM | POA: Insufficient documentation

## 2016-07-14 DIAGNOSIS — Y999 Unspecified external cause status: Secondary | ICD-10-CM | POA: Insufficient documentation

## 2016-07-14 DIAGNOSIS — Z794 Long term (current) use of insulin: Secondary | ICD-10-CM | POA: Insufficient documentation

## 2016-07-14 DIAGNOSIS — M25531 Pain in right wrist: Secondary | ICD-10-CM

## 2016-07-14 DIAGNOSIS — W07XXXA Fall from chair, initial encounter: Secondary | ICD-10-CM | POA: Insufficient documentation

## 2016-07-14 DIAGNOSIS — Y939 Activity, unspecified: Secondary | ICD-10-CM | POA: Diagnosis not present

## 2016-07-14 DIAGNOSIS — Y929 Unspecified place or not applicable: Secondary | ICD-10-CM | POA: Insufficient documentation

## 2016-07-14 DIAGNOSIS — E119 Type 2 diabetes mellitus without complications: Secondary | ICD-10-CM | POA: Insufficient documentation

## 2016-07-14 DIAGNOSIS — S6991XA Unspecified injury of right wrist, hand and finger(s), initial encounter: Secondary | ICD-10-CM | POA: Insufficient documentation

## 2016-07-14 NOTE — ED Triage Notes (Signed)
Pt reports ultram has not relieved wrist pain . Pt reports she took one of her Husbands hydrocodone which decreased the pain in the rt wrist.

## 2016-07-14 NOTE — ED Triage Notes (Addendum)
Pt states she fell out of a rolling desk chair last thursday and had no pain until Sunday she noticed her right wrist was painful and swollen.

## 2016-07-14 NOTE — Discharge Instructions (Signed)
We are unsure what is causing your wrist pain. Please pay attention for signs of redness, heat, or pain with movement of the joints. We think it may be a strain. Please use the wrist splint to help with any discomfort. Please returned to follow-up withyour primary care physician.

## 2016-07-14 NOTE — ED Triage Notes (Signed)
Pt. Did not want ice pack for wrist because ice does not help.

## 2016-07-14 NOTE — ED Provider Notes (Signed)
[facility] DEPT Provider Note   CSN: [id number] Arrival date & time: 07/14/16  1105  By signing my name below, I, Levester Fresh, attest that this documentation has been prepared under the direction and in the presence of Chimamanda Siegfried Julio Alm, MD.  Electronically Signed: Levester Fresh, Scribe. 07/14/2016. 12:38 PM.  History   Chief Complaint Chief Complaint  Patient presents with  . Wrist Pain   Kaitlyn Jackson is a 71 y.o. female with a PMHx consisting of DM and arthritis who presents to the Emergency Department with complaints of sudden onset right wrist pain x2 days. Pt denies any recent injury or trauma to the area, however, triage note from today notes that pt states that she fell out of a rolling chair x6 days ago. Sx have been unresolved by OTC medications, Bengay cream, epsom salt soaks or Tramadol. Pt took her husband's hydrocodone, which relieved her sx. Pt denies experiencing any other acute sx and has no hx of gout   The history is provided by the patient. No language interpreter was used.   Past Medical History:  Diagnosis Date  . Arthritis   . Clotting disorder ([place])   . Diabetes mellitus   . GERD (gastroesophageal reflux disease)   . Heart murmur   . Hypertension   . Mitral valve prolapse   . Sleep apnea    does not use every night- CPAP    Patient Active Problem List   Diagnosis Date Noted  . 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 High risk medication use 01/06/2016  . 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 Vitamin D deficiency 01/06/2016  . Osteopenia 03/14/2012  . Diabetes mellitus type 2 in obese ([place]) 03/14/2012  . 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 Hypertension 03/14/2012    Past Surgical History:  Procedure Laterality Date  . BACK SURGERY     lower - removed  cysts  . REPLACEMENT TOTAL KNEE  2008-2009  . TRIGGER FINGER RELEASE Right  12/14/2013   Procedure: RELEASE TRIGGER FINGER/A-1 PULLEY RIGHT RING FINGER;  Surgeon: Daryll Brod, MD;  Location: [facility];  Service: Orthopedics;  Laterality: Right;    OB History    No data available       Home Medications    Prior to Admission medications   Medication Sig Start Date End Date Taking? Authorizing Provider  acetaminophen (TYLENOL) 500 MG tablet Take 500 mg by mouth every 6 (six) hours as needed for moderate pain.    Historical Provider, MD  atorvastatin (LIPITOR) 20 MG tablet Take 10 mg by mouth at bedtime.    Historical Provider, MD  benazepril (LOTENSIN) 40 MG tablet Take 40 mg by mouth daily.    Historical Provider, MD  calamine lotion Apply 1 application topically as needed for itching.    Historical Provider, MD  chlorpheniramine (CHLOR-TRIMETON) 4 MG tablet Take 4 mg by mouth 2 (two) times daily as needed for allergies.    Historical Provider, MD  cholecalciferol (VITAMIN D) 1000 UNITS tablet Take 1,000 Units by mouth daily.    Historical Provider, MD  desonide (DESOWEN) 0.05 % lotion Apply 1 application topically 2 (two) times daily.    Historical Provider, MD  dicyclomine (BENTYL) 20 MG tablet Take 1 tablet (20 mg total) by mouth 2 (two) times daily. 11/09/15   Olivia Canter Sam, PA-C  esomeprazole (NEXIUM) 40 MG capsule Take 40 mg by mouth daily before breakfast.  Historical Provider, MD  fexofenadine (ALLEGRA) 180 MG tablet Take 180 mg by mouth daily.    Historical Provider, MD  fluticasone (FLONASE) 50 MCG/ACT nasal spray Place 1 spray into both nostrils daily.    Historical Provider, MD  folic acid (FOLVITE) 1 MG tablet Take 1 mg by mouth daily.    Historical Provider, MD  furosemide (LASIX) 40 MG tablet Take 40 mg by mouth daily.    Historical Provider, MD  HYDROcodone-acetaminophen (NORCO) 5-325 MG per tablet Take 1 tablet by mouth every 6 (six) hours as needed for moderate pain. Patient not taking: Reported on 01/16/2015 12/14/13   Daryll Brod, MD    hydrOXYzine (ATARAX/VISTARIL) 25 MG tablet Take 1 tablet (25 mg total) by mouth every 6 (six) hours. 01/16/15   Samantha Tripp Dowless, PA-C  insulin NPH-insulin regular (NOVOLIN 70/30) (70-30) 100 UNIT/ML injection Inject 10-30 Units into the skin daily with breakfast. Takes 30 units in the morning and 10 units at night    Historical Provider, MD  methocarbamol (ROBAXIN) 500 MG tablet Take 1 tablet (500 mg total) by mouth every 8 (eight) hours as needed for muscle spasms. 01/19/16   Zenovia Jarred, DO  montelukast (SINGULAIR) 10 MG tablet Take 10 mg by mouth at bedtime.    Historical Provider, MD  Multiple Vitamins-Minerals (VITAMIN D3 COMPLETE PO) Take 1,500 tablets by mouth.    Historical Provider, MD  oxyCODONE-acetaminophen (PERCOCET/ROXICET) 5-325 MG tablet Take 1 tablet by mouth every 6 (six) hours as needed for severe pain. 01/19/16   Zenovia Jarred, DO  pregabalin (LYRICA) 100 MG capsule Take 100 mg by mouth 2 (two) times daily.    Historical Provider, MD  sitaGLIPtan-metformin (JANUMET) 50-1000 MG per tablet Take 1 tablet by mouth 2 (two) times daily with a meal.    Historical Provider, MD  Sulfacetamide Sodium (SODIUM SULFACETAMIDE EX) Apply 1 application topically every 6 (six) hours as needed (for itching).     Historical Provider, MD  traMADol (ULTRAM) 50 MG tablet Take 2 tablets (100 mg total) by mouth every 6 (six) hours as needed. 03/01/16   Gareth Morgan, MD  verapamil (COVERA HS) 180 MG (CO) 24 hr tablet Take 180 mg by mouth 2 (two) times daily.    Historical Provider, MD  warfarin (COUMADIN) 5 MG tablet Take 5 mg by mouth as directed. Takes 2 tablets Thursday,friday,saturday and Sunday and 1&1/2 tablet Monday,tuesday,and wednesday    Historical Provider, MD    Family History Family History  Problem Relation Age of Onset  . Cancer Mother     colon  . Cancer Cousin     lung    Social History Social History  Substance Use Topics  . Smoking status: Never Smoker  .  Smokeless tobacco: Never Used  . Alcohol use No     Allergies   Patient has no known allergies.   Review of Systems Review of Systems  Constitutional: Negative for activity change.  Gastrointestinal: Negative for nausea.  Musculoskeletal: Positive for arthralgias and myalgias.     Physical Exam Updated Vital Signs BP (!) 149/66 (BP Location: Left Arm)   Pulse 86   Temp 98.1 F (36.7 C) (Oral)   Resp 19   SpO2 97%   Physical Exam  Constitutional: She is oriented to person, place, and time. She appears well-developed and well-nourished. No distress.  HENT:  Head: Normocephalic and atraumatic.  Cardiovascular: Normal rate.   Pulmonary/Chest: Effort normal.  Musculoskeletal:  Right wrist with no warmth. Good  ROM without pain, No edema or erythema  Neurological: She is alert and oriented to person, place, and time.  Skin: Skin is warm and dry.  Psychiatric: She has a normal mood and affect.  Nursing note and vitals reviewed.  ED Treatments / Results  DIAGNOSTIC STUDIES: Oxygen Saturation is 97% on RA, adequate by my interpretation.    COORDINATION OF CARE: 12:28 PM Discussed treatment plan with pt and husband at bedside. They agreed to plan.  Labs (all labs ordered are listed, but only abnormal results are displayed) Labs Reviewed - No data to display  EKG  EKG Interpretation None       Radiology No results found.  Procedures Procedures (including critical care time)  Medications Ordered in ED Medications - No data to display   Initial Impression / Assessment and Plan / ED Course  I have reviewed the triage vital signs and the nursing notes.  Pertinent labs & imaging results that were available during my care of the patient were reviewed by me and considered in my medical decision making (see chart for details).  I personally performed the services described in this documentation, which was scribed in my presence. The recorded information has been  reviewed and is accurate.   Patient is a 71 year old presenting with wrist pain. Patient's wrist appears physically normal. She has no pain with range of motion. I doubt septic joint. Doubt gout. Patient may have strained her wrist or she may have osteoarthritis. We will have her use wrist splint to help.  Stricter return precautions given.  Final Clinical Impressions(s) / ED Diagnoses   Final diagnoses:  None    New Prescriptions New Prescriptions   No medications on file     Gorman Safi Julio Alm, MD 07/14/16 1319

## 2016-07-14 NOTE — ED Triage Notes (Signed)
Pt reports Rt wrist pain started on Sunday . Pt has tried ice ,heat and ultram with out relief of pain. Pt reports she fell in Hospital on Thursday . Pt declined to be seen by MD at the time of fall.

## 2016-07-21 ENCOUNTER — Ambulatory Visit (INDEPENDENT_AMBULATORY_CARE_PROVIDER_SITE_OTHER): Payer: Medicare Other | Admitting: Podiatry

## 2016-07-21 ENCOUNTER — Encounter: Payer: Self-pay | Admitting: Podiatry

## 2016-07-21 ENCOUNTER — Telehealth: Payer: Self-pay | Admitting: Podiatry

## 2016-07-21 VITALS — BP 119/70 | HR 91

## 2016-07-21 DIAGNOSIS — E119 Type 2 diabetes mellitus without complications: Secondary | ICD-10-CM | POA: Diagnosis not present

## 2016-07-21 DIAGNOSIS — B351 Tinea unguium: Secondary | ICD-10-CM

## 2016-07-21 DIAGNOSIS — R0989 Other specified symptoms and signs involving the circulatory and respiratory systems: Secondary | ICD-10-CM | POA: Diagnosis not present

## 2016-07-21 DIAGNOSIS — L608 Other nail disorders: Secondary | ICD-10-CM | POA: Diagnosis not present

## 2016-07-21 DIAGNOSIS — E669 Obesity, unspecified: Secondary | ICD-10-CM | POA: Diagnosis not present

## 2016-07-21 DIAGNOSIS — M21622 Bunionette of left foot: Secondary | ICD-10-CM

## 2016-07-21 DIAGNOSIS — M21621 Bunionette of right foot: Secondary | ICD-10-CM

## 2016-07-21 DIAGNOSIS — E1169 Type 2 diabetes mellitus with other specified complication: Secondary | ICD-10-CM

## 2016-07-21 NOTE — Telephone Encounter (Signed)
Pt. Needs paperwork for diabetic shoes paperwork,  faxed to Dr. Jeanann Lewandowsky.

## 2016-07-21 NOTE — Patient Instructions (Signed)

## 2016-07-21 NOTE — Progress Notes (Signed)
Patient ID: Kaitlyn Jackson, female   DOB: [birth year], 71 y.o.   MRN: [id number]   Subjective:  This patient presents today requesting trimming of her toenails. Patient describes repetitive podiatric care visit estimated per patient last 2-3 months. She describes repetitive trimming of toenails over multiple years. Patient states that she has neuropathy and complains of coldness in her feet.  Patient has a 20 year history of diabetes and denies any history of foot ulceration, claudication or amputation Patient denies smoking history Patient is requesting diabetic shoes today  Objective:  Orientated 3  Patient states she is approximately 5 feet 4 inches and weighs approximately 230 pounds  Vascular: No peripheral edema or calf tenderness bilaterally DP  pulses 2/4 bilaterally PT pulses 1/4 bilaterally Capillary reflex immediate bilaterally  Neurological: Sensation to 10 g monofilament wire intact 4/5 right and 5/5 left Vibratory sensation reactive bilaterally Ankle reflexes reactive bilaterally  Dermatological No open skin lesions bilaterally Atrophic skin with absent hair growth bilaterally The toenails are elongated incurvated with occasional texture and color changes 6-10  Musculoskeletal: Pes planus bilaterally Bunionette's bilaterally Medial transverse drifting toes 2-5 bilaterally Manual motor testing dorsi flexion, plantar flexion, inversion, eversion 5/5 bilaterally  Assessment: Diabetic with decreased posterior tibial pulses Bunionette's bilaterally Mycotic toenails Type II diabetic  Plan: Toenails 6-10 are debrided mechanically and left without any bleeding Obtain certification for diabetic shoes indication: Other to acquired deformities of foot/bunionette's bilaterally Diminished posterior tibial pulses bilaterally  Reappoint 3 months for nail debridement Notify patient on receipt of certification for diabetic shoes

## 2016-07-23 ENCOUNTER — Other Ambulatory Visit: Payer: Self-pay | Admitting: Orthopedic Surgery

## 2016-07-23 DIAGNOSIS — M778 Other enthesopathies, not elsewhere classified: Secondary | ICD-10-CM

## 2016-07-23 DIAGNOSIS — S62001A Unspecified fracture of navicular [scaphoid] bone of right wrist, initial encounter for closed fracture: Secondary | ICD-10-CM

## 2016-08-04 ENCOUNTER — Ambulatory Visit
Admission: RE | Admit: 2016-08-04 | Discharge: 2016-08-04 | Disposition: A | Discharge status: Home / Self Care | Payer: Medicare Other | Source: Ambulatory Visit | Attending: Orthopedic Surgery | Admitting: Orthopedic Surgery

## 2016-08-04 DIAGNOSIS — M778 Other enthesopathies, not elsewhere classified: Secondary | ICD-10-CM

## 2016-08-04 DIAGNOSIS — S62001A Unspecified fracture of navicular [scaphoid] bone of right wrist, initial encounter for closed fracture: Secondary | ICD-10-CM

## 2016-08-24 ENCOUNTER — Telehealth: Payer: Self-pay | Admitting: Podiatry

## 2016-08-24 NOTE — Telephone Encounter (Signed)
Pt called to check to see if we got the forms back from her pcp for diabetic shoes. She asked if we have not received the paperwork if she could pick it up and take it to her pcp for him to fill out.

## 2016-09-09 NOTE — Telephone Encounter (Signed)
Dawn, I don't see an order for her in the [employer] system.Marland KitchenMarland KitchenMarland KitchenCould we get her in for a fitting appointment?

## 2016-09-14 ENCOUNTER — Ambulatory Visit (INDEPENDENT_AMBULATORY_CARE_PROVIDER_SITE_OTHER): Payer: Medicare Other | Admitting: Orthotics

## 2016-09-14 DIAGNOSIS — E119 Type 2 diabetes mellitus without complications: Secondary | ICD-10-CM

## 2016-09-14 DIAGNOSIS — M21621 Bunionette of right foot: Secondary | ICD-10-CM

## 2016-09-14 DIAGNOSIS — R0989 Other specified symptoms and signs involving the circulatory and respiratory systems: Secondary | ICD-10-CM

## 2016-09-14 DIAGNOSIS — M21622 Bunionette of left foot: Secondary | ICD-10-CM

## 2016-09-14 NOTE — Progress Notes (Signed)
Patient came in today for fitting and eval for diabetic shoes: Patient' doctor here is Dr Franchot Gallo  Patient presents with decreased pedal pulses and bunionettes bilater.  Patient was measured with brannock device and cast in foam for custom inserts.  Patient chose Kaitlyn Jackson [id number]  10.5 m

## 2016-10-08 ENCOUNTER — Telehealth: Payer: Self-pay | Admitting: Podiatry

## 2016-10-08 NOTE — Telephone Encounter (Signed)
Pt scheduled for 7.30.18 t pick up shoes but pts doctor has not signed the diabetic shoe paperwork that is needed to pick them up.We need to r/s appt. I requested the patient call Dr Jeanann Lewandowsky to see why.

## 2016-10-12 ENCOUNTER — Other Ambulatory Visit: Payer: Medicare Other | Admitting: Orthotics

## 2016-10-21 ENCOUNTER — Ambulatory Visit (INDEPENDENT_AMBULATORY_CARE_PROVIDER_SITE_OTHER): Payer: Medicare Other | Admitting: Podiatry

## 2016-10-21 ENCOUNTER — Encounter: Payer: Self-pay | Admitting: Podiatry

## 2016-10-21 DIAGNOSIS — M79674 Pain in right toe(s): Secondary | ICD-10-CM | POA: Diagnosis not present

## 2016-10-21 DIAGNOSIS — B351 Tinea unguium: Secondary | ICD-10-CM | POA: Diagnosis not present

## 2016-10-21 DIAGNOSIS — M79675 Pain in left toe(s): Secondary | ICD-10-CM

## 2016-10-21 DIAGNOSIS — E1151 Type 2 diabetes mellitus with diabetic peripheral angiopathy without gangrene: Secondary | ICD-10-CM

## 2016-10-21 NOTE — Progress Notes (Signed)
Patient ID: Kaitlyn Jackson, female   DOB: [birth year], 71 y.o.   MRN: [id number]   Subjective:  This patient presents today requesting trimming of her toenails. Patient describes repetitive podiatric care visit estimated per patient last 2-3 months. She describes repetitive trimming of toenails over multiple years. Patient states that she has neuropathy and complains of coldness in her feet.  Patient has a 20 year history of diabetes and denies any history of foot ulceration, claudication or amputation Patient denies smoking history Patient is requesting diabetic shoes today  Objective:  Orientated 3  Patient states she is approximately 5 feet 4 inches and weighs approximately 230 pounds  Vascular: No peripheral edema or calf tenderness bilaterally DP  pulses 2/4 bilaterally PT pulses 1/4 bilaterally Capillary reflex immediate bilaterally  Neurological: Sensation to 10 g monofilament wire intact 4/5 right and 5/5 left Vibratory sensation reactive bilaterally Ankle reflexes reactive bilaterally  Dermatological No open skin lesions bilaterally Atrophic skin with absent hair growth bilaterally The toenails are elongated incurvated with occasional texture and color changes 6-10  Musculoskeletal: Pes planus bilaterally Bunionette's bilaterally Medial transverse drifting toes 2-5 bilaterally Manual motor testing dorsi flexion, plantar flexion, inversion, eversion 5/5 bilaterally  Assessment:  Bunionette's bilaterally Symptomatic 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

## 2016-10-21 NOTE — Patient Instructions (Signed)

## 2016-10-22 ENCOUNTER — Ambulatory Visit (INDEPENDENT_AMBULATORY_CARE_PROVIDER_SITE_OTHER): Payer: Medicare Other | Admitting: Orthotics

## 2016-10-22 DIAGNOSIS — R0989 Other specified symptoms and signs involving the circulatory and respiratory systems: Secondary | ICD-10-CM

## 2016-10-22 DIAGNOSIS — E1169 Type 2 diabetes mellitus with other specified complication: Secondary | ICD-10-CM

## 2016-10-22 DIAGNOSIS — M21621 Bunionette of right foot: Secondary | ICD-10-CM

## 2016-10-22 DIAGNOSIS — M21622 Bunionette of left foot: Secondary | ICD-10-CM | POA: Diagnosis not present

## 2016-10-22 DIAGNOSIS — E1151 Type 2 diabetes mellitus with diabetic peripheral angiopathy without gangrene: Secondary | ICD-10-CM

## 2016-10-22 DIAGNOSIS — E119 Type 2 diabetes mellitus without complications: Secondary | ICD-10-CM

## 2016-10-22 DIAGNOSIS — E669 Obesity, unspecified: Secondary | ICD-10-CM

## 2016-10-22 NOTE — Progress Notes (Signed)

## 2016-12-14 ENCOUNTER — Ambulatory Visit (INDEPENDENT_AMBULATORY_CARE_PROVIDER_SITE_OTHER): Payer: Medicare Other | Admitting: Ophthalmology

## 2016-12-14 DIAGNOSIS — E113311 Type 2 diabetes mellitus with moderate nonproliferative diabetic retinopathy with macular edema, right eye: Secondary | ICD-10-CM | POA: Diagnosis not present

## 2016-12-14 DIAGNOSIS — I10 Essential (primary) hypertension: Secondary | ICD-10-CM | POA: Diagnosis not present

## 2016-12-14 DIAGNOSIS — H35033 Hypertensive retinopathy, bilateral: Secondary | ICD-10-CM | POA: Diagnosis not present

## 2016-12-14 DIAGNOSIS — E113392 Type 2 diabetes mellitus with moderate nonproliferative diabetic retinopathy without macular edema, left eye: Secondary | ICD-10-CM

## 2016-12-14 DIAGNOSIS — H43813 Vitreous degeneration, bilateral: Secondary | ICD-10-CM

## 2016-12-14 DIAGNOSIS — H33302 Unspecified retinal break, left eye: Secondary | ICD-10-CM | POA: Diagnosis not present

## 2016-12-14 DIAGNOSIS — E11311 Type 2 diabetes mellitus with unspecified diabetic retinopathy with macular edema: Secondary | ICD-10-CM | POA: Diagnosis not present

## 2016-12-17 ENCOUNTER — Ambulatory Visit (INDEPENDENT_AMBULATORY_CARE_PROVIDER_SITE_OTHER): Payer: Medicare Other

## 2016-12-17 ENCOUNTER — Ambulatory Visit (INDEPENDENT_AMBULATORY_CARE_PROVIDER_SITE_OTHER): Payer: Self-pay

## 2016-12-17 ENCOUNTER — Encounter (INDEPENDENT_AMBULATORY_CARE_PROVIDER_SITE_OTHER): Payer: Self-pay | Admitting: Orthopaedic Surgery

## 2016-12-17 ENCOUNTER — Ambulatory Visit (INDEPENDENT_AMBULATORY_CARE_PROVIDER_SITE_OTHER): Payer: Medicare Other | Admitting: Orthopaedic Surgery

## 2016-12-17 VITALS — BP 159/84 | HR 98 | Resp 16 | Ht 64.0 in | Wt 240.0 lb

## 2016-12-17 DIAGNOSIS — M25562 Pain in left knee: Secondary | ICD-10-CM

## 2016-12-17 DIAGNOSIS — M25561 Pain in right knee: Secondary | ICD-10-CM

## 2016-12-17 DIAGNOSIS — Z96659 Presence of unspecified artificial knee joint: Secondary | ICD-10-CM

## 2016-12-17 DIAGNOSIS — T84038A Mechanical loosening of other internal prosthetic joint, initial encounter: Secondary | ICD-10-CM | POA: Diagnosis not present

## 2016-12-17 DIAGNOSIS — Z96653 Presence of artificial knee joint, bilateral: Secondary | ICD-10-CM

## 2016-12-17 DIAGNOSIS — G8929 Other chronic pain: Secondary | ICD-10-CM

## 2016-12-17 NOTE — Progress Notes (Signed)
Office Visit Note   [name]           Date of Birth: 04-06-1945           MRN: [id number] Visit Date: 12/17/2016              Requested by: Foye Spurling, MD [address] [place], [place] [address] PCP: Foye Spurling, MD   Assessment & Plan: Visit Diagnoses:  1. Chronic pain of both knees   2. S/P total knee replacement using cement, bilateral     Plan: #1: Were going to obtain laboratory studies including that of a CBC and differential, sedimentation rate, C-reactive protein, as well as a CMET. #2: Three-phase bone scan of the lower extremities to rule out loosening of the prosthesis bilaterally #3: Follow back up in 3 weeks for recheck evaluation.  Follow-Up Instructions: Return in about 3 weeks (around 01/07/2017).   Orders:  Orders Placed This Encounter  Procedures  . XR KNEE 3 VIEW LEFT  . XR KNEE 3 VIEW RIGHT  . NM Bone Scan 3 Phase Lower Extremity  . CBC w/Diff/Platelet  . Sed Rate (ESR)  . C-reactive protein  . Calcium, ionized  . Calcium  . Alkaline phosphatase  . COMPLETE METABOLIC PANEL WITH GFR   No orders of the defined types were placed in this encounter.     Procedures: No procedures performed   Clinical Data: No additional findings.   Subjective: Chief Complaint  Patient presents with  . Right Knee - Pain  . Left Knee - Pain    Kaitlyn Jackson is a very pleasant 71 year old African-American she has not been having any treatment recently because of the fact that the female seen today for bilateral knee pain. She is undergone total knee replacement on the right in 2009 on the left in 2008. She states her knee pain that was worse on the right knee. Her pain is distributed about the joint lines. She has tried bracing in the past. He does have difficulty with ambulation secondary to the pain now. She had been told previously that she had some loosening of the cement from the bone. Unfortunately her husband has  congestive heart failure and is hospitalized at least once or twice a month when he goes into congestive heart failure. She states she is not ready to have anything done to the knee. She did question about the use of narcotics. Apparently Dr. Arnoldo Morale is seen her for her lumbar spine and is stated that she probably needs screws in the back for her arthritis. He has given her tramadol but she is now much improved and must take 2 tablets of sometimes. She does have some instability symptoms in the right knee more than the left. Denies any recent history of injury or trauma.    Review of Systems  Musculoskeletal: Positive for gait problem.       Bilateral knees painful right greater than left  All other systems reviewed and are negative.    Objective: Vital Signs: BP (!) 159/84   Pulse 98   Resp 16   Ht _0  (1.626 m)   Wt 240 lb (108.9 kg)   BMI 41.20 kg/m   Physical Exam  Constitutional: She is oriented to person, place, and time. She appears well-developed and well-nourished.  HENT:  Head: Normocephalic and atraumatic.  Eyes: Pupils are equal, round, and reactive to light. EOM are normal.  Pulmonary/Chest: Effort normal.  Neurological: She is alert  and oriented to person, place, and time.  Skin: Skin is warm and dry.  Psychiatric: She has a normal mood and affect. Her behavior is normal. Judgment and thought content normal.    Ortho Exam  Right knee today reveals near complete extension. Flexion to only about 90-95. She listed a laxity with valgus stressing but does have an endpoint. I do not feel any crepitance this time. She does have a trace effusion but is difficult to tell for sure because size of her leg.  Left knee today reveals range of motion from near complete extension to about 100. She has good ligamentous stability. No crepitance. Trace to 1+ effusion. And again difficulty because of the sizer length tell for certain.  Specialty Comments:  No specialty comments  available.  Imaging: Xr Knee 3 View Left  Result Date: 12/17/2016 Three-view left knee reveals intact prosthesis of a total knee replacement. Little bit of notching at the distal femur. Good position otherwise and alignment of the prosthesis. Little bit of a lucency under the medial tibial plateau but otherwise cement mantle looks intact.  Xr Knee 3 View Right  Result Date: 12/17/2016 Three-view x-ray of the right total knee replacement reveals cystic changes of the medial tibial plateau. There appears to possibly be some cystic changes at the lateral tibial plateau. Deepest states this is in varus at this time. The medial lateral cement mantle appears to be disengaged from the tibial plateau are that of ostial lysis occurring at the interface.    PMFS History: Patient Active Problem List   Diagnosis Date Noted  . 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 High risk medication use 01/06/2016  . 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 Vitamin D deficiency 01/06/2016  . Osteopenia 03/14/2012  . Diabetes mellitus type 2 in obese ([place]) 03/14/2012  . 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 Hypertension 03/14/2012   Past Medical History:  Diagnosis Date  . Arthritis   . Clotting disorder ([place])   . Diabetes mellitus   . GERD (gastroesophageal reflux disease)   . Heart murmur   . Hypertension   . Mitral valve prolapse   . Sleep apnea    does not use every night- CPAP    Family History  Problem Relation Age of Onset  . Cancer Mother        colon  . Cancer Cousin        lung    Past Surgical History:  Procedure Laterality Date  . BACK SURGERY     lower - removed  cysts  . REPLACEMENT TOTAL KNEE  2008-2009  . TRIGGER FINGER RELEASE Right 12/14/2013   Procedure: RELEASE TRIGGER FINGER/A-1 PULLEY RIGHT RING FINGER;  Surgeon: Daryll Brod, MD;  Location: [facility];  Service:  Orthopedics;  Laterality: Right;   Social History   Occupational History  . Not on file.   Social History Main Topics  . Smoking status: Never Smoker  . Smokeless tobacco: Never Used  . Alcohol use No  . Drug use: No  . Sexual activity: No

## 2016-12-18 LAB — CBC WITH DIFFERENTIAL/PLATELET
BASOS PCT: 0.4 %
Basophils Absolute: 28 cells/uL (ref 0–200)
EOS PCT: 0.9 %
Eosinophils Absolute: 63 cells/uL (ref 15–500)
HCT: 41.4 % (ref 35.0–45.0)
Hemoglobin: 13.1 g/dL (ref 11.7–15.5)
Lymphs Abs: 1477 cells/uL (ref 850–3900)
MCH: 27.6 pg (ref 27.0–33.0)
MCHC: 31.6 g/dL — ABNORMAL LOW (ref 32.0–36.0)
MCV: 87.3 fL (ref 80.0–100.0)
MONOS PCT: 6.9 %
MPV: 10.3 fL (ref 7.5–12.5)
Neutro Abs: 4949 cells/uL (ref 1500–7800)
Neutrophils Relative %: 70.7 %
PLATELETS: 383 10*3/uL (ref 140–400)
RBC: 4.74 10*6/uL (ref 3.80–5.10)
RDW: 12.7 % (ref 11.0–15.0)
TOTAL LYMPHOCYTE: 21.1 %
WBC mixed population: 483 cells/uL (ref 200–950)
WBC: 7 10*3/uL (ref 3.8–10.8)

## 2016-12-18 LAB — COMPLETE METABOLIC PANEL WITH GFR
AG Ratio: 1.2 (calc) (ref 1.0–2.5)
ALT: 9 U/L (ref 6–29)
AST: 13 U/L (ref 10–35)
Albumin: 3.7 g/dL (ref 3.6–5.1)
Alkaline phosphatase (APISO): 118 U/L (ref 33–130)
BUN/Creatinine Ratio: 13 (calc) (ref 6–22)
BUN: 16 mg/dL (ref 7–25)
CALCIUM: 9.3 mg/dL (ref 8.6–10.4)
CO2: 29 mmol/L (ref 20–32)
CREATININE: 1.19 mg/dL — AB (ref 0.60–0.93)
Chloride: 100 mmol/L (ref 98–110)
GFR, EST NON AFRICAN AMERICAN: 45 mL/min/{1.73_m2} — AB (ref >=60)
GFR, Est African American: 52 mL/min/{1.73_m2} — ABNORMAL LOW (ref >=60)
GLUCOSE: 368 mg/dL — AB (ref 65–99)
Globulin: 3.1 g/dL (calc) (ref 1.9–3.7)
Potassium: 4.7 mmol/L (ref 3.5–5.3)
Sodium: 136 mmol/L (ref 135–146)
Total Bilirubin: 0.3 mg/dL (ref 0.2–1.2)
Total Protein: 6.8 g/dL (ref 6.1–8.1)

## 2016-12-18 LAB — C-REACTIVE PROTEIN: CRP: 18.9 mg/L — AB (ref <8.0)

## 2016-12-18 LAB — SEDIMENTATION RATE: SED RATE: 30 mm/h (ref 0–30)

## 2016-12-18 LAB — CALCIUM, IONIZED: CALCIUM ION: 5 mg/dL (ref 4.8–5.6)

## 2016-12-30 ENCOUNTER — Encounter (HOSPITAL_COMMUNITY)
Admission: RE | Admit: 2016-12-30 | Discharge: 2016-12-30 | Disposition: A | Discharge status: Home / Self Care | Payer: Medicare Other | Source: Ambulatory Visit | Attending: Orthopedic Surgery | Admitting: Orthopedic Surgery

## 2016-12-30 ENCOUNTER — Ambulatory Visit (HOSPITAL_COMMUNITY)
Admission: RE | Admit: 2016-12-30 | Discharge: 2016-12-30 | Disposition: A | Discharge status: Home / Self Care | Payer: Medicare Other | Source: Ambulatory Visit | Attending: Orthopedic Surgery | Admitting: Orthopedic Surgery

## 2016-12-30 DIAGNOSIS — Z96653 Presence of artificial knee joint, bilateral: Secondary | ICD-10-CM | POA: Insufficient documentation

## 2016-12-30 DIAGNOSIS — R6889 Other general symptoms and signs: Secondary | ICD-10-CM | POA: Diagnosis not present

## 2016-12-30 DIAGNOSIS — M25562 Pain in left knee: Secondary | ICD-10-CM | POA: Diagnosis not present

## 2016-12-30 DIAGNOSIS — G8929 Other chronic pain: Secondary | ICD-10-CM | POA: Diagnosis not present

## 2016-12-30 DIAGNOSIS — M25561 Pain in right knee: Secondary | ICD-10-CM | POA: Diagnosis present

## 2016-12-30 MED ORDER — TECHNETIUM TC 99M MEDRONATE IV KIT
25.0000 | PACK | Freq: Once | INTRAVENOUS | Status: AC | PRN
  Administered 2016-12-30: 25 via INTRAVENOUS

## 2017-01-08 ENCOUNTER — Ambulatory Visit (INDEPENDENT_AMBULATORY_CARE_PROVIDER_SITE_OTHER): Payer: Medicare Other | Admitting: Orthopaedic Surgery

## 2017-01-08 ENCOUNTER — Encounter (INDEPENDENT_AMBULATORY_CARE_PROVIDER_SITE_OTHER): Payer: Self-pay | Admitting: Orthopaedic Surgery

## 2017-01-08 VITALS — BP 140/82 | HR 70 | Resp 16 | Ht 62.0 in | Wt 250.0 lb

## 2017-01-08 DIAGNOSIS — Z96651 Presence of right artificial knee joint: Secondary | ICD-10-CM

## 2017-01-08 DIAGNOSIS — M25561 Pain in right knee: Secondary | ICD-10-CM | POA: Diagnosis not present

## 2017-01-08 DIAGNOSIS — G8929 Other chronic pain: Secondary | ICD-10-CM | POA: Diagnosis not present

## 2017-01-08 NOTE — Progress Notes (Signed)
Office Visit Note   [name]           Date of Birth: 04/12/1945           MRN: [id number] Visit Date: 01/08/2017              Requested by: Foye Spurling, MD [id number] [place], [place] [address] PCP: Foye Spurling, MD   Assessment & Plan: Visit Diagnoses:  1. Chronic pain of right knee   2. History of total right knee replacement     Plan: 3 phase bone scan of both knees demonstrates capsulitis, synovitis and possible loosening of the tibial component of the right total knee replacement. Sedimentation rate was normal. The C-reactive protein was elevated at 18. Presently asymptomatic. Long discussion regarding treatment. I think it's worth aspirating her".not sure the objective data fits her clinical picture" for several weeks. We'll reschedule her return. Not sure the objective data fits her clinical  Exam.   Follow-Up Instructions: Return if symptoms worsen or fail to improve.   Orders:  No orders of the defined types were placed in this encounter.  No orders of the defined types were placed in this encounter.     Procedures: No procedures performed   Clinical Data: No additional findings.   Subjective: Chief Complaint  Patient presents with  . Right Knee - Results    Kaitlyn Jackson is a 71 y o that is here for results of her 3 phase bone scan. Her R knee worse that left.   Presently asymptomatic. Kaitlyn Jackson relates that her pain "comes and goes. She is not using any ambulatory aid. No fever or chills  HPI  Review of Systems  Constitutional: Positive for fatigue. Negative for chills and fever.  Eyes: Negative for itching.  Respiratory: Negative for chest tightness and shortness of breath.   Cardiovascular: Negative for chest pain, palpitations and leg swelling.  Gastrointestinal: Positive for constipation. Negative for blood in stool and diarrhea.  Endocrine: Negative for polyuria.  Genitourinary: Negative for  dysuria.  Musculoskeletal: Positive for back pain. Negative for joint swelling, neck pain and neck stiffness.  Allergic/Immunologic: Negative for immunocompromised state.  Neurological: Negative for dizziness and numbness.  Hematological: Does not bruise/bleed easily.  Psychiatric/Behavioral: Positive for sleep disturbance. The patient is not nervous/anxious.      Objective: Vital Signs: BP 140/82   Pulse 70   Resp 16   Ht 5\' 2"  (1.575 m)   Wt 250 lb (113.4 kg)   BMI 45.73 kg/m   Physical Exam  Ortho Exam awake alert and oriented 3. Comfortable sitting. Very pleasant. No distress. Possible minimal effusion of her right knee. Right knee was not hot or red. No instability. Full extension and flexion over 100 without instability. No localized areas of tenderness. Straight leg raise negative bilaterally .painless range of motion both hips.  Specialty Comments:  No specialty comments available.  Imaging: No results found.   PMFS History: Patient Active Problem List   Diagnosis Date Noted  . 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 High risk medication use 01/06/2016  . 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 Vitamin D deficiency 01/06/2016  . Osteopenia 03/14/2012  . Diabetes mellitus type 2 in obese ([place]) 03/14/2012  . 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 Hypertension 03/14/2012   Past Medical History:  Diagnosis Date  . Arthritis   . Clotting disorder ([place])   .  Diabetes mellitus   . GERD (gastroesophageal reflux disease)   . Heart murmur   . Hypertension   . Mitral valve prolapse   . Sleep apnea    does not use every night- CPAP    Family History  Problem Relation Age of Onset  . Cancer Mother        colon  . Cancer Cousin        lung    Past Surgical History:  Procedure Laterality Date  . BACK SURGERY     lower - removed  cysts  . REPLACEMENT TOTAL KNEE   2008-2009  . TRIGGER FINGER RELEASE Right 12/14/2013   Procedure: RELEASE TRIGGER FINGER/A-1 PULLEY RIGHT RING FINGER;  Surgeon: Daryll Brod, MD;  Location: [facility];  Service: Orthopedics;  Laterality: Right;   Social History   Occupational History  . Not on file.   Social History Main Topics  . Smoking status: Never Smoker  . Smokeless tobacco: Never Used  . Alcohol use No  . Drug use: No  . Sexual activity: No

## 2017-01-09 ENCOUNTER — Encounter (HOSPITAL_COMMUNITY): Payer: Self-pay | Admitting: Emergency Medicine

## 2017-01-09 ENCOUNTER — Emergency Department (HOSPITAL_COMMUNITY)
Admission: EM | Admit: 2017-01-09 | Discharge: 2017-01-09 | Disposition: A | Discharge status: Home / Self Care | Payer: Medicare Other | Attending: Emergency Medicine | Admitting: Emergency Medicine

## 2017-01-09 ENCOUNTER — Emergency Department (HOSPITAL_COMMUNITY): Payer: Medicare Other

## 2017-01-09 ENCOUNTER — Other Ambulatory Visit: Payer: Self-pay

## 2017-01-09 DIAGNOSIS — R590 Localized enlarged lymph nodes: Secondary | ICD-10-CM | POA: Diagnosis not present

## 2017-01-09 DIAGNOSIS — R791 Abnormal coagulation profile: Secondary | ICD-10-CM | POA: Diagnosis not present

## 2017-01-09 DIAGNOSIS — I10 Essential (primary) hypertension: Secondary | ICD-10-CM | POA: Insufficient documentation

## 2017-01-09 DIAGNOSIS — Z96659 Presence of unspecified artificial knee joint: Secondary | ICD-10-CM | POA: Insufficient documentation

## 2017-01-09 DIAGNOSIS — R0781 Pleurodynia: Secondary | ICD-10-CM

## 2017-01-09 DIAGNOSIS — E119 Type 2 diabetes mellitus without complications: Secondary | ICD-10-CM | POA: Diagnosis not present

## 2017-01-09 DIAGNOSIS — Z7901 Long term (current) use of anticoagulants: Secondary | ICD-10-CM | POA: Diagnosis not present

## 2017-01-09 DIAGNOSIS — Z794 Long term (current) use of insulin: Secondary | ICD-10-CM | POA: Insufficient documentation

## 2017-01-09 DIAGNOSIS — Z79899 Other long term (current) drug therapy: Secondary | ICD-10-CM | POA: Insufficient documentation

## 2017-01-09 DIAGNOSIS — R079 Chest pain, unspecified: Secondary | ICD-10-CM | POA: Diagnosis present

## 2017-01-09 DIAGNOSIS — R599 Enlarged lymph nodes, unspecified: Secondary | ICD-10-CM

## 2017-01-09 LAB — CBC
HCT: 38.4 % (ref 36.0–46.0)
HEMOGLOBIN: 12.2 g/dL (ref 12.0–15.0)
MCH: 28.1 pg (ref 26.0–34.0)
MCHC: 31.8 g/dL (ref 30.0–36.0)
MCV: 88.5 fL (ref 78.0–100.0)
PLATELETS: 358 10*3/uL (ref 150–400)
RBC: 4.34 MIL/uL (ref 3.87–5.11)
RDW: 14.7 % (ref 11.5–15.5)
WBC: 8.8 10*3/uL (ref 4.0–10.5)

## 2017-01-09 LAB — BASIC METABOLIC PANEL
ANION GAP: 9 (ref 5–15)
BUN: 9 mg/dL (ref 6–20)
CALCIUM: 8.7 mg/dL — AB (ref 8.9–10.3)
CO2: 27 mmol/L (ref 22–32)
CREATININE: 0.97 mg/dL (ref 0.44–1.00)
Chloride: 101 mmol/L (ref 101–111)
GFR, EST NON AFRICAN AMERICAN: 57 mL/min — AB (ref >60)
Glucose, Bld: 193 mg/dL — ABNORMAL HIGH (ref 65–99)
Potassium: 3.8 mmol/L (ref 3.5–5.1)
SODIUM: 137 mmol/L (ref 135–145)

## 2017-01-09 LAB — I-STAT TROPONIN, ED
TROPONIN I, POC: 0 ng/mL (ref 0.00–0.08)
TROPONIN I, POC: 0 ng/mL (ref 0.00–0.08)
Troponin i, poc: 0 ng/mL (ref 0.00–0.08)

## 2017-01-09 LAB — PROTIME-INR
INR: 0.96
PROTHROMBIN TIME: 12.7 s (ref 11.4–15.2)

## 2017-01-09 MED ORDER — IOPAMIDOL (ISOVUE-370) INJECTION 76%
INTRAVENOUS | Status: AC
Start: 2017-01-09 — End: 2017-01-09
  Administered 2017-01-09: 80 mL
  Filled 2017-01-09: qty 100

## 2017-01-09 MED ORDER — CYCLOBENZAPRINE HCL 10 MG PO TABS
10.0000 mg | ORAL_TABLET | Freq: Once | ORAL | Status: AC
  Administered 2017-01-09: 10 mg via ORAL
  Filled 2017-01-09: qty 1

## 2017-01-09 MED ORDER — ENOXAPARIN SODIUM 120 MG/0.8ML ~~LOC~~ SOLN
1.0000 mg/kg | Freq: Once | SUBCUTANEOUS | Status: AC
  Administered 2017-01-09: 115 mg via SUBCUTANEOUS
  Filled 2017-01-09: qty 0.8

## 2017-01-09 MED ORDER — CYCLOBENZAPRINE HCL 10 MG PO TABS: 10.0000 mg | ORAL_TABLET | Freq: Two times a day (BID) | ORAL | 0 refills | Status: DC | PRN

## 2017-01-09 NOTE — ED Provider Notes (Signed)
[facility] EMERGENCY DEPARTMENT Provider Note   CSN: [id number] Arrival date & time: 01/09/17  0435     History   Chief Complaint Chief Complaint  Patient presents with  . Chest Pain    HPI Kaitlyn Jackson is a 71 y.o. female.  HPI   Yesterday morning started to have chest pain, at first thought it was gas, but taking medication for that without relief.  Tried laying on right side without relief.  If take a deep breath it hurts.  Feels like sharp pain, left sided, feels it in back of shoulder on left side.  Feels like it hurts to take deep breath, making it difficult to breath.  No nausea or vomiting. No pain or swelling in legs.  No lightheadedness.  Had episode of coughing earlier.  Pain 8/10 now. If she were to sit up 10/10.    On coumadin, hx of clot in groin 1996, noted blood thick in other test/clotting disorder and kept on life long anticoagulation. No hx of PE, no recent surgeries.  No smoking,   Past Medical History:  Diagnosis Date  . Arthritis   . Clotting disorder ([place])   . Diabetes mellitus   . GERD (gastroesophageal reflux disease)   . Heart murmur   . Hypertension   . Mitral valve prolapse   . Sleep apnea    does not use every night- CPAP    Patient Active Problem List   Diagnosis Date Noted  . 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 High risk medication use 01/06/2016  . 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 Vitamin D deficiency 01/06/2016  . Osteopenia 03/14/2012  . Diabetes mellitus type 2 in obese ([place]) 03/14/2012  . 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 Hypertension 03/14/2012    Past Surgical History:  Procedure Laterality Date  . BACK SURGERY     lower - removed  cysts  . REPLACEMENT TOTAL KNEE  2008-2009  . TRIGGER FINGER RELEASE Right 12/14/2013   Procedure: RELEASE TRIGGER FINGER/A-1 PULLEY RIGHT RING FINGER;  Surgeon:  Daryll Brod, MD;  Location: [facility];  Service: Orthopedics;  Laterality: Right;    OB History    No data available       Home Medications    Prior to Admission medications   Medication Sig Start Date End Date Taking? Authorizing Provider  acetaminophen (TYLENOL) 500 MG tablet Take 500 mg by mouth every 6 (six) hours as needed for moderate pain.    [provider]  atorvastatin (LIPITOR) 20 MG tablet Take 10 mg by mouth at bedtime.    [provider]  benazepril (LOTENSIN) 40 MG tablet Take 40 mg by mouth daily.    [provider]  calamine lotion Apply 1 application topically as needed for itching.    [provider]  chlorpheniramine (CHLOR-TRIMETON) 4 MG tablet Take 4 mg by mouth 2 (two) times daily as needed for allergies.    [provider]  cholecalciferol (VITAMIN D) 1000 UNITS tablet Take 1,000 Units by mouth daily.    [provider]  cyclobenzaprine (FLEXERIL) 10 MG tablet Take 1 tablet (10 mg total) by mouth 2 (two) times daily as needed for muscle spasms. 01/09/17   Gareth Morgan, MD  desonide (DESOWEN) 0.05 % lotion Apply 1 application topically 2 (two) times daily.    [provider]  dicyclomine (BENTYL) 20 MG tablet Take 1 tablet (20  mg total) by mouth 2 (two) times daily. 11/09/15   Sam, Olivia Canter, PA-C  esomeprazole (NEXIUM) 40 MG capsule Take 40 mg by mouth daily before breakfast.    [provider]  fexofenadine (ALLEGRA) 180 MG tablet Take 180 mg by mouth daily.    [provider]  fluticasone (FLONASE) 50 MCG/ACT nasal spray Place 1 spray into both nostrils daily.    [provider]  folic acid (FOLVITE) 1 MG tablet Take 1 mg by mouth daily.    [provider]  furosemide (LASIX) 40 MG tablet Take 40 mg by mouth daily.    [provider]  HUMALOG MIX 75/25 KWIKPEN (75-25) 100 UNIT/ML Kwikpen INJECT 60 UNITS SQ EVERY MORNING 10/28/16   [provider]  hydrOXYzine (ATARAX/VISTARIL) 25 MG tablet Take 1 tablet (25 mg total) by mouth every 6 (six) hours. 01/16/15   Dowless, Aldona Bar Tripp, PA-C  insulin NPH-insulin regular (NOVOLIN 70/30) (70-30) 100 UNIT/ML injection Inject 10-30 Units into the skin daily with breakfast. Takes 30 units in the morning and 10 units at night    [provider]  methocarbamol (ROBAXIN) 500 MG tablet Take 1 tablet (500 mg total) by mouth every 8 (eight) hours as needed for muscle spasms. Patient not taking: Reported on 01/08/2017 01/19/16   Zenovia Jarred, DO  montelukast (SINGULAIR) 10 MG tablet Take 10 mg by mouth at bedtime.    [provider]  Multiple Vitamins-Minerals (VITAMIN D3 COMPLETE PO) Take 1,500 tablets by mouth.    [provider]  pregabalin (LYRICA) 100 MG capsule Take 100 mg by mouth 2 (two) times daily.    [provider]  sitaGLIPtan-metformin (JANUMET) 50-1000 MG per tablet Take 1 tablet by mouth 2 (two) times daily with a meal.    [provider]  Sulfacetamide Sodium (SODIUM SULFACETAMIDE EX) Apply 1 application topically every 6 (six) hours as needed (for itching).     [provider]  traMADol (ULTRAM) 50 MG tablet Take 2 tablets (100 mg total) by mouth every 6 (six) hours as needed. 03/01/16   Gareth Morgan, MD  verapamil (COVERA HS) 180 MG (CO) 24 hr tablet Take 180 mg by mouth 2 (two) times daily.    [provider]  warfarin (COUMADIN) 10 MG tablet Take 10 mg by mouth every other day. 10/24/16   [provider]  warfarin (COUMADIN) 5 MG tablet Take 5 mg by mouth as directed. Takes 2 tablets Thursday,friday,saturday and Sunday and 1&1/2 tablet Monday,tuesday,and wednesday    [provider]  warfarin (COUMADIN) 7.5 MG tablet TAKE 1 TABLET EVERY OTHER DAY ALTERNATING WITH 10 MG 10/24/16   [provider]    Family History Family History  Problem Relation Age of Onset  . Cancer Mother         colon  . Cancer Cousin        lung    Social History Social History  Substance Use Topics  . Smoking status: Never Smoker  . Smokeless tobacco: Never Used  . Alcohol use No     Allergies   Patient has no known allergies.   Review of Systems Review of Systems  Constitutional: Negative for fever.  HENT: Negative for sore throat.   Eyes: Negative for visual disturbance.  Respiratory: Positive for shortness of breath (slight, more hurts to breathe). Negative for cough.   Cardiovascular: Positive for chest pain and leg swelling (unchanged from baseline).  Gastrointestinal: Negative for abdominal pain, nausea and  vomiting.  Genitourinary: Negative for difficulty urinating.  Musculoskeletal: Positive for back pain. Negative for neck pain.  Skin: Negative for rash.  Neurological: Negative for syncope and headaches.     Physical Exam Updated Vital Signs BP (!) 158/73 (BP Location: Left Arm)   Pulse 91   Temp 97.7 F (36.5 C) (Oral)   Resp 18   SpO2 97%   Physical Exam  Constitutional: She is oriented to person, place, and time. She appears well-developed and well-nourished. No distress.  HENT:  Head: Normocephalic and atraumatic.  Eyes: Conjunctivae and EOM are normal.  Neck: Normal range of motion.  Cardiovascular: Normal rate, regular rhythm, normal heart sounds and intact distal pulses.  Exam reveals no gallop and no friction rub.   No murmur heard. Pulmonary/Chest: Effort normal and breath sounds normal. No respiratory distress. She has no wheezes. She has no rales. She exhibits tenderness.  Abdominal: Soft. She exhibits no distension. There is no tenderness. There is no guarding.  Musculoskeletal: She exhibits no edema or tenderness.  Tenderness left back  Neurological: She is alert and oriented to person, place, and time.  Skin: Skin is warm and dry. No rash noted. She is not diaphoretic. No erythema.  Nursing note and vitals reviewed.    ED Treatments /  Results  Labs (all labs ordered are listed, but only abnormal results are displayed) Labs Reviewed  BASIC METABOLIC PANEL - Abnormal; Notable for the following:       Result Value   Glucose, Bld 193 (*)    Calcium 8.7 (*)    GFR calc non Af Amer 57 (*)    All other components within normal limits  CBC  PROTIME-INR  I-STAT TROPONIN, ED  I-STAT TROPONIN, ED  I-STAT TROPONIN, ED    EKG  EKG Interpretation  Date/Time:  Saturday January 09 2017 04:37:56 EDT Ventricular Rate:  91 PR Interval:  190 QRS Duration: 84 QT Interval:  356 QTC Calculation: 437 R Axis:   -28 Text Interpretation:  Normal sinus rhythm Possible Anterior infarct , age undetermined Abnormal ECG No significant change since last tracing Confirmed by Gareth Morgan [phone]) on 01/09/2017 8:04:52 AM Also confirmed by Gareth Morgan [phone]), editor Philomena Doheny [phone])  on 01/09/2017 8:39:59 AM       Radiology Dg Chest 2 View  Result Date: 01/09/2017 CLINICAL DATA:  Acute onset of generalized chest pain, radiating to the back and shoulders. Initial encounter. EXAM: CHEST  2 VIEW COMPARISON:  Chest radiograph performed 05/21/2011 FINDINGS: The lungs are well-aerated. Mild bibasilar atelectasis is noted. There is no evidence of pleural effusion or pneumothorax. The heart is borderline normal in size. No acute osseous abnormalities are seen. IMPRESSION: Mild bibasilar atelectasis noted.  Lungs otherwise grossly clear. Electronically Signed   By: [name] M.D.   On: 01/09/2017 05:40   Ct Angio Chest Pe W And/or Wo Contrast  Result Date: 01/09/2017 CLINICAL DATA:  Left-sided chest pain since Friday morning, pain with deep breaths. EXAM: CT ANGIOGRAPHY CHEST WITH CONTRAST TECHNIQUE: Multidetector CT imaging of the chest was performed using the standard protocol during bolus administration of intravenous contrast. Multiplanar CT image reconstructions and MIPs were obtained to evaluate the vascular anatomy.  CONTRAST:  80 cc Isovue 370 COMPARISON:  Chest CT dated 08/14/2010. FINDINGS: Cardiovascular: The most peripheral segmental and subsegmental pulmonary artery branches of each lung are difficult to definitively characterize due to mild patient breathing motion artifact, however, there is no pulmonary embolism identified within the main, lobar  or central segmental pulmonary arteries bilaterally. No thoracic aortic aneurysm or dissection. Mild cardiomegaly is stable. No pericardial effusion. Mediastinum/Nodes: Esophagus is unremarkable. No mass or enlarged lymph nodes seen within the mediastinum or perihilar regions. Somewhat prominent lymph node is identified in the left axilla measuring 2.3 cm, new compared to the previous CT. Trachea and central bronchi are unremarkable. Lungs/Pleura: Mild scarring/atelectasis within each lung. Lungs otherwise clear. No evidence of pneumonia. No pleural effusion or pneumothorax. Upper Abdomen: Limited images of the upper abdomen are unremarkable. Musculoskeletal: Degenerative spurring within the thoracic spine and lower cervical spine, mild to moderate in degree. No acute or suspicious osseous finding. Review of the MIP images confirms the above findings. IMPRESSION: 1. No pulmonary embolism identified, with mild study limitations detailed above. 2. No acute intrathoracic abnormality. No evidence of pneumonia or pulmonary edema. 3. Stable mild cardiomegaly. 4. **An incidental finding of potential clinical significance has been found. Prominent lymph node in the left axilla, new compared to an earlier CT from 2012. Recommend nonemergent follow-up with left axillary ultrasound and diagnostic mammogram at a dedicated [facility].** Electronically Signed   By: Franki Cabot M.D.   On: 01/09/2017 12:22    Procedures Procedures (including critical care time)  Medications Ordered in ED Medications  enoxaparin (LOVENOX) injection 115 mg (not administered)  cyclobenzaprine  (FLEXERIL) tablet 10 mg (10 mg Oral Given 01/09/17 0918)  iopamidol (ISOVUE-370) 76 % injection (80 mLs  Contrast Given 01/09/17 1142)     Initial Impression / Assessment and Plan / ED Course  I have reviewed the triage vital signs and the nursing notes.  Pertinent labs & imaging results that were available during my care of the patient were reviewed by me and considered in my medical decision making (see chart for details).     71 year old female with a history diabetes, hypertension, discoid lupus, clotting disorder on lifelong anticoagulation with Coumadin, presents with concern for sharp chest pain beginning yesterday morning.  EKG shows no significant findings.  Chest x-ray shows no sign of pneumonia, pneumothorax or pulmonary edema.  Given history of clotting disorder, CT PE study done which showed no pulmonary embolus.  INR today is subtherapeutic.  Troponin negative x3.  Low suspicion for cardiac etiology given sharp chest pain, worse with palpation of both chest and back.  Discussed this with family. Given rx for flexeril.  CT does show incidental finding of enlarged left axillary lymph node.  Discussed this with patient and husband, recommend outpatient mammogram, ultrasound and primary care physician follow-up.  Patient to follow-up with her primary care doctor on Monday.  Given a dose of Lovenox for subtherapeutic INR in the emergency department, and recommend taking 10 mg of Coumadin tonight and tomorrow, prior to follow-up with her PCP on Monday.  It appears that her INR is frequently subtherapeutic.  Patient discharged in stable condition with understanding of reasons to return.   Final Clinical Impressions(s) / ED Diagnoses   Final diagnoses:  Pleuritic chest pain  Subtherapeutic international normalized ratio (INR)  Enlarged lymph node, left axilla    New Prescriptions New Prescriptions   CYCLOBENZAPRINE (FLEXERIL) 10 MG TABLET    Take 1 tablet (10 mg total) by mouth 2 (two)  times daily as needed for muscle spasms.     Gareth Morgan, MD 01/09/17 [phone]

## 2017-01-09 NOTE — ED Notes (Signed)
Pt ambulated to restroom. Pt has steady gait with stand by assist and tolerated well.

## 2017-01-09 NOTE — ED Triage Notes (Signed)
Pt woke up with sharp L sided chest pain Friday morning that has remained constant.  She thought it may be gas so the took gas medication without relief.  Reports burping.  States it hurts to take a deep breath.  Denies nausea and vomiting.

## 2017-01-09 NOTE — ED Notes (Signed)
Did second ekg shown to Dr Barbee Cough

## 2017-01-11 DIAGNOSIS — M65332 Trigger finger, left middle finger: Secondary | ICD-10-CM | POA: Insufficient documentation

## 2017-01-12 ENCOUNTER — Other Ambulatory Visit: Payer: Self-pay | Admitting: Orthopedic Surgery

## 2017-01-14 DIAGNOSIS — M65332 Trigger finger, left middle finger: Secondary | ICD-10-CM

## 2017-01-14 HISTORY — DX: Trigger finger, left middle finger: M65.332

## 2017-01-19 ENCOUNTER — Encounter: Payer: Self-pay | Admitting: Podiatry

## 2017-01-19 ENCOUNTER — Ambulatory Visit (INDEPENDENT_AMBULATORY_CARE_PROVIDER_SITE_OTHER): Payer: Medicare Other | Admitting: Podiatry

## 2017-01-19 DIAGNOSIS — M79674 Pain in right toe(s): Secondary | ICD-10-CM

## 2017-01-19 DIAGNOSIS — M79675 Pain in left toe(s): Secondary | ICD-10-CM | POA: Diagnosis not present

## 2017-01-19 DIAGNOSIS — B351 Tinea unguium: Secondary | ICD-10-CM | POA: Diagnosis not present

## 2017-01-19 DIAGNOSIS — E1151 Type 2 diabetes mellitus with diabetic peripheral angiopathy without gangrene: Secondary | ICD-10-CM

## 2017-01-19 NOTE — Progress Notes (Signed)
Patient ID: Kaitlyn Jackson, female   DOB: 12/24/1945, 71 y.o.   MRN: [id number]    Subjective:  This patient presents today requesting trimming of her toenails. Patient describes repetitive podiatric care visit estimated per patient last 2-3 months. She describes repetitive trimming of toenails over multiple years. Patient states that she has neuropathy and complains of coldness in her feet.  Patient has a 20 year history of diabetes and denies any history of foot ulceration, claudication or amputation Patient denies smoking history Patient is requesting diabetic shoes today  Objective:  Orientated 3  Patient states she is approximately 5 feet 4 inches and weighs approximately 230 pounds  Vascular: No peripheral edema or calf tenderness bilaterally DP pulses 2/4 bilaterally PT pulses 1/4 bilaterally Capillary reflex immediate bilaterally  Neurological: Sensation to 10 g monofilament wire intact 4/5 right and 5/5 left Vibratory sensation reactive bilaterally Ankle reflexes reactive bilaterally  Dermatological No open skin lesions bilaterally Atrophic skin with absent hair growth bilaterally The toenails are elongated incurvated with occasional texture and color changes 6-10  Musculoskeletal: Pes planus bilaterally Bunionette's bilaterally Medial transverse drifting toes 2-5 bilaterally Manual motor testing dorsi flexion, plantar flexion, inversion, eversion 5/5 bilaterally  Assessment:  Bunionette's bilaterally 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

## 2017-01-19 NOTE — Patient Instructions (Signed)

## 2017-01-29 ENCOUNTER — Ambulatory Visit (INDEPENDENT_AMBULATORY_CARE_PROVIDER_SITE_OTHER): Payer: Medicare Other | Admitting: Orthopaedic Surgery

## 2017-01-29 ENCOUNTER — Encounter (INDEPENDENT_AMBULATORY_CARE_PROVIDER_SITE_OTHER): Payer: Self-pay | Admitting: Orthopaedic Surgery

## 2017-01-29 VITALS — BP 146/65 | HR 77 | Resp 17 | Ht 64.0 in | Wt 240.0 lb

## 2017-01-29 DIAGNOSIS — G8929 Other chronic pain: Secondary | ICD-10-CM | POA: Diagnosis not present

## 2017-01-29 DIAGNOSIS — M25561 Pain in right knee: Secondary | ICD-10-CM

## 2017-01-29 MED ORDER — METHYLPREDNISOLONE ACETATE 40 MG/ML IJ SUSP
80.0000 mg | INTRAMUSCULAR | Status: AC | PRN
  Administered 2017-01-29: 80 mg

## 2017-01-29 MED ORDER — LIDOCAINE HCL 1 % IJ SOLN
2.0000 mL | INTRAMUSCULAR | Status: AC | PRN
  Administered 2017-01-29: 2 mL

## 2017-01-29 MED ORDER — BUPIVACAINE HCL 0.5 % IJ SOLN
2.0000 mL | INTRAMUSCULAR | Status: AC | PRN
  Administered 2017-01-29: 2 mL via INTRA_ARTICULAR

## 2017-01-29 NOTE — Progress Notes (Signed)
Office Visit Note   [name]           Date of Birth: 02/26/1946           MRN: [id number] Visit Date: 01/29/2017              Requested by: Kaitlyn Spurling, MD [id number] [place], [place] [address] PCP: Kaitlyn Spurling, MD   Assessment & Plan: Visit Diagnoses:  1. Chronic pain of right knee     Plan: Kaitlyn Jackson has bilateral total knee replacements. She's had some recent discomfort. 3 phase bone scan of both knees revealed some mild increased uptake in the left and greater uptake on the right consistent with synovitis and possible early loosening. She comes back today for aspiration of her right knee and injection of cortisone. She's had slightly elevated C-reactive protein and normal sedimentation rate. We'll plan to see back in the next several weeks if no improvement with a cortisone injection. I aspirated 10 mL of clear barely yellow fluid I'll send this to the lab. I then injected 80 mg of Depo-Medrol Follow-Up Instructions: Return if symptoms worsen or fail to improve.   Orders:  Orders Placed This Encounter  Procedures  . Anaerobic culture  . Anaerobic and Aerobic Culture  . Gram stain  . Glucose, synovial fluid  . Protein, Synovial Fluid  . Synovial cell count + diff, w/ crystals   No orders of the defined types were placed in this encounter.     Procedures: Large Joint Inj: R knee on 01/29/2017 10:37 AM Indications: pain and diagnostic evaluation Details: 25 G 1.5 in needle, anteromedial approach  Arthrogram: No  Medications: 2 mL lidocaine 1 %; 2 mL bupivacaine 0.5 %; 80 mg methylPREDNISolone acetate 40 MG/ML Aspirate: 10 mL clear; sent for lab analysis Outcome: tolerated well, no immediate complications Procedure, treatment alternatives, risks and benefits explained, specific risks discussed. Consent was given by the patient. Immediately prior to procedure a time out was called to verify the correct patient,  procedure, equipment, support staff and site/side marked as required. Patient was prepped and draped in the usual sterile fashion.       Clinical Data: No additional findings.   Subjective: Chief Complaint  Patient presents with  . Right Knee - Pain  . Knee Pain    Right knee pain x years, BIL total knee replacement 2008 and 2009 Dr. Durward Fortes, right hurts worse than left, difficulty walking, uses cane, difficulty sleeping at night, swelling at times, diabetic, Tramadol, Tylenol helps some, wants inj.    HPI  Review of Systems  Constitutional: Positive for activity change and fatigue.  HENT: Negative for trouble swallowing.   Eyes: Negative for visual disturbance.  Respiratory: Negative for shortness of breath.   Cardiovascular: Positive for leg swelling.  Gastrointestinal: Positive for constipation.  Endocrine: Positive for cold intolerance.  Genitourinary: Negative for difficulty urinating.  Musculoskeletal: Positive for back pain, gait problem and joint swelling.  Skin: Negative for rash.  Neurological: Negative for numbness.  Hematological: Bruises/bleeds easily.  Psychiatric/Behavioral: Positive for sleep disturbance.     Objective: Vital Signs: BP (!) 146/65 (BP Location: Right Arm, Patient Position: Sitting, Cuff Size: Normal)   Pulse 77   Resp 17   Ht 5\' 4"  (1.626 m)   Wt 240 lb (108.9 kg)   BMI 41.20 kg/m   Physical Exam  Ortho Exam [place] alert and oriented 3 comfortable sitting. Does have some discomfort with ambulation with  a limp that she believes is related to her right knee. Right knee was not hot red or swollen. No instability. Did not feel like she had an effusion but does have large knees. Some medial lateral joint discomfort, but quite mild. Oh calf pain. No specialty comments available.  Imaging: No results found.   PMFS History: Patient Active Problem List   Diagnosis Date Noted  . 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 High risk  medication use 01/06/2016  . 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 Vitamin D deficiency 01/06/2016  . Osteopenia 03/14/2012  . Diabetes mellitus type 2 in obese ([place]) 03/14/2012  . 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 Hypertension 03/14/2012   Past Medical History:  Diagnosis Date  . Arthritis   . Clotting disorder ([place])   . Diabetes mellitus   . GERD (gastroesophageal reflux disease)   . Heart murmur   . Hypertension   . Lupus   . Mitral valve prolapse   . Sleep apnea    does not use every night- CPAP    Family History  Problem Relation Age of Onset  . Cancer Mother        colon  . Cancer Cousin        lung    Past Surgical History:  Procedure Laterality Date  . BACK SURGERY     lower - removed  cysts  . RELEASE TRIGGER FINGER/A-1 PULLEY RIGHT RING FINGER Right 12/14/2013   Performed by Daryll Brod, MD at [facility]  . REPLACEMENT TOTAL KNEE  2008-2009   Social History   Occupational History  . Not on file  Tobacco Use  . Smoking status: Never Smoker  . Smokeless tobacco: Never Used  Substance and Sexual Activity  . Alcohol use: No  . Drug use: No  . Sexual activity: No    Birth control/protection: Post-menopausal

## 2017-02-01 LAB — ANAEROBIC CULTURE

## 2017-02-01 LAB — SYNOVIAL CELL COUNT + DIFF, W/ CRYSTALS

## 2017-02-08 ENCOUNTER — Encounter (INDEPENDENT_AMBULATORY_CARE_PROVIDER_SITE_OTHER): Payer: Medicare Other | Admitting: Ophthalmology

## 2017-02-08 ENCOUNTER — Other Ambulatory Visit: Payer: Self-pay

## 2017-02-08 ENCOUNTER — Encounter (HOSPITAL_BASED_OUTPATIENT_CLINIC_OR_DEPARTMENT_OTHER): Payer: Self-pay | Admitting: *Deleted

## 2017-02-08 DIAGNOSIS — R059 Cough, unspecified: Secondary | ICD-10-CM

## 2017-02-08 DIAGNOSIS — E11311 Type 2 diabetes mellitus with unspecified diabetic retinopathy with macular edema: Secondary | ICD-10-CM

## 2017-02-08 DIAGNOSIS — H33302 Unspecified retinal break, left eye: Secondary | ICD-10-CM

## 2017-02-08 DIAGNOSIS — E113292 Type 2 diabetes mellitus with mild nonproliferative diabetic retinopathy without macular edema, left eye: Secondary | ICD-10-CM | POA: Diagnosis not present

## 2017-02-08 DIAGNOSIS — I10 Essential (primary) hypertension: Secondary | ICD-10-CM | POA: Diagnosis not present

## 2017-02-08 DIAGNOSIS — H43813 Vitreous degeneration, bilateral: Secondary | ICD-10-CM | POA: Diagnosis not present

## 2017-02-08 DIAGNOSIS — H35033 Hypertensive retinopathy, bilateral: Secondary | ICD-10-CM | POA: Diagnosis not present

## 2017-02-08 DIAGNOSIS — E113311 Type 2 diabetes mellitus with moderate nonproliferative diabetic retinopathy with macular edema, right eye: Secondary | ICD-10-CM

## 2017-02-08 HISTORY — DX: Cough, unspecified: R05.9

## 2017-02-08 NOTE — Pre-Procedure Instructions (Signed)
To come for BMET and PT/INR

## 2017-02-10 ENCOUNTER — Encounter (HOSPITAL_BASED_OUTPATIENT_CLINIC_OR_DEPARTMENT_OTHER)
Admission: RE | Admit: 2017-02-10 | Discharge: 2017-02-10 | Disposition: A | Discharge status: Home / Self Care | Payer: Medicare Other | Source: Ambulatory Visit | Attending: Orthopedic Surgery | Admitting: Orthopedic Surgery

## 2017-02-10 DIAGNOSIS — I2510 Atherosclerotic heart disease of native coronary artery without angina pectoris: Secondary | ICD-10-CM | POA: Diagnosis not present

## 2017-02-10 DIAGNOSIS — E11319 Type 2 diabetes mellitus with unspecified diabetic retinopathy without macular edema: Secondary | ICD-10-CM | POA: Diagnosis not present

## 2017-02-10 DIAGNOSIS — Z7901 Long term (current) use of anticoagulants: Secondary | ICD-10-CM | POA: Diagnosis not present

## 2017-02-10 DIAGNOSIS — M1990 Unspecified osteoarthritis, unspecified site: Secondary | ICD-10-CM | POA: Diagnosis not present

## 2017-02-10 DIAGNOSIS — M65332 Trigger finger, left middle finger: Secondary | ICD-10-CM | POA: Diagnosis not present

## 2017-02-10 DIAGNOSIS — K219 Gastro-esophageal reflux disease without esophagitis: Secondary | ICD-10-CM | POA: Diagnosis not present

## 2017-02-10 DIAGNOSIS — Z6841 Body Mass Index (BMI) 40.0 and over, adult: Secondary | ICD-10-CM | POA: Diagnosis not present

## 2017-02-10 DIAGNOSIS — I10 Essential (primary) hypertension: Secondary | ICD-10-CM | POA: Diagnosis not present

## 2017-02-10 DIAGNOSIS — R011 Cardiac murmur, unspecified: Secondary | ICD-10-CM | POA: Diagnosis not present

## 2017-02-10 DIAGNOSIS — Z794 Long term (current) use of insulin: Secondary | ICD-10-CM | POA: Diagnosis not present

## 2017-02-10 DIAGNOSIS — Z96653 Presence of artificial knee joint, bilateral: Secondary | ICD-10-CM | POA: Diagnosis not present

## 2017-02-10 DIAGNOSIS — Z79899 Other long term (current) drug therapy: Secondary | ICD-10-CM | POA: Diagnosis not present

## 2017-02-10 DIAGNOSIS — I341 Nonrheumatic mitral (valve) prolapse: Secondary | ICD-10-CM | POA: Diagnosis not present

## 2017-02-10 DIAGNOSIS — G4730 Sleep apnea, unspecified: Secondary | ICD-10-CM | POA: Diagnosis not present

## 2017-02-10 LAB — BASIC METABOLIC PANEL
Anion gap: 9 (ref 5–15)
BUN: 19 mg/dL (ref 6–20)
CALCIUM: 9.1 mg/dL (ref 8.9–10.3)
CO2: 28 mmol/L (ref 22–32)
CREATININE: 1.04 mg/dL — AB (ref 0.44–1.00)
Chloride: 102 mmol/L (ref 101–111)
GFR calc non Af Amer: 52 mL/min — ABNORMAL LOW (ref >60)
Glucose, Bld: 226 mg/dL — ABNORMAL HIGH (ref 65–99)
Potassium: 3.8 mmol/L (ref 3.5–5.1)
SODIUM: 139 mmol/L (ref 135–145)

## 2017-02-10 LAB — PROTIME-INR
INR: 0.98
Prothrombin Time: 12.9 seconds (ref 11.4–15.2)

## 2017-02-10 NOTE — Anesthesia Preprocedure Evaluation (Signed)
Anesthesia Evaluation  Patient identified by MRN, date of birth, ID band Patient awake    Reviewed: Allergy & Precautions, H&P , NPO status , Patient's Chart, lab work & pertinent test results  Airway Mallampati: I  TM Distance: >3 FB Neck ROM: Full    Dental  (+) Edentulous Upper, Edentulous Lower, Upper Dentures, Lower Dentures   Pulmonary sleep apnea ,    breath sounds clear to auscultation       Cardiovascular hypertension, Pt. on medications + CAD  + Valvular Problems/Murmurs  Rhythm:Regular Rate:Normal     Neuro/Psych    GI/Hepatic GERD  Medicated and Controlled,  Endo/Other  diabetes, Well Controlled, Type 2, Insulin DependentMorbid obesity  Renal/GU      Musculoskeletal  (+) Arthritis , Osteoarthritis,    Abdominal   Peds  Hematology   Anesthesia Other Findings   Reproductive/Obstetrics                            Anesthesia Physical  Anesthesia Plan  ASA: III  Anesthesia Plan: MAC and Bier Block   Post-op Pain Management:    Induction: Intravenous  PONV Risk Score and Plan: 2 and Treatment may vary due to age or medical condition and Ondansetron  Airway Management Planned: Simple Face Mask, Nasal Cannula and Natural Airway  Additional Equipment:   Intra-op Plan:   Post-operative Plan:   Informed Consent: I have reviewed the patients History and Physical, chart, labs and discussed the procedure including the risks, benefits and alternatives for the proposed anesthesia with the patient or authorized representative who has indicated his/her understanding and acceptance.   Dental advisory given  Plan Discussed with: CRNA, Anesthesiologist and Surgeon  Anesthesia Plan Comments:        Anesthesia Quick Evaluation

## 2017-02-11 ENCOUNTER — Ambulatory Visit (HOSPITAL_BASED_OUTPATIENT_CLINIC_OR_DEPARTMENT_OTHER): Payer: Medicare Other | Admitting: Anesthesiology

## 2017-02-11 ENCOUNTER — Encounter (HOSPITAL_BASED_OUTPATIENT_CLINIC_OR_DEPARTMENT_OTHER)
Admission: RE | Disposition: A | Discharge status: Home / Self Care | Payer: Self-pay | Source: Ambulatory Visit | Attending: Orthopedic Surgery

## 2017-02-11 ENCOUNTER — Encounter (HOSPITAL_BASED_OUTPATIENT_CLINIC_OR_DEPARTMENT_OTHER): Payer: Self-pay | Admitting: Anesthesiology

## 2017-02-11 ENCOUNTER — Other Ambulatory Visit: Payer: Self-pay

## 2017-02-11 ENCOUNTER — Ambulatory Visit (HOSPITAL_BASED_OUTPATIENT_CLINIC_OR_DEPARTMENT_OTHER)
Admission: RE | Admit: 2017-02-11 | Discharge: 2017-02-11 | Disposition: A | Discharge status: Home / Self Care | Payer: Medicare Other | Source: Ambulatory Visit | Attending: Orthopedic Surgery | Admitting: Orthopedic Surgery

## 2017-02-11 DIAGNOSIS — K219 Gastro-esophageal reflux disease without esophagitis: Secondary | ICD-10-CM | POA: Insufficient documentation

## 2017-02-11 DIAGNOSIS — I2510 Atherosclerotic heart disease of native coronary artery without angina pectoris: Secondary | ICD-10-CM | POA: Diagnosis not present

## 2017-02-11 DIAGNOSIS — M1990 Unspecified osteoarthritis, unspecified site: Secondary | ICD-10-CM | POA: Insufficient documentation

## 2017-02-11 DIAGNOSIS — M65332 Trigger finger, left middle finger: Secondary | ICD-10-CM | POA: Diagnosis not present

## 2017-02-11 DIAGNOSIS — I10 Essential (primary) hypertension: Secondary | ICD-10-CM | POA: Diagnosis not present

## 2017-02-11 DIAGNOSIS — Z79899 Other long term (current) drug therapy: Secondary | ICD-10-CM | POA: Insufficient documentation

## 2017-02-11 DIAGNOSIS — I341 Nonrheumatic mitral (valve) prolapse: Secondary | ICD-10-CM | POA: Insufficient documentation

## 2017-02-11 DIAGNOSIS — R011 Cardiac murmur, unspecified: Secondary | ICD-10-CM | POA: Insufficient documentation

## 2017-02-11 DIAGNOSIS — Z7901 Long term (current) use of anticoagulants: Secondary | ICD-10-CM | POA: Insufficient documentation

## 2017-02-11 DIAGNOSIS — Z794 Long term (current) use of insulin: Secondary | ICD-10-CM | POA: Insufficient documentation

## 2017-02-11 DIAGNOSIS — Z96653 Presence of artificial knee joint, bilateral: Secondary | ICD-10-CM | POA: Insufficient documentation

## 2017-02-11 DIAGNOSIS — Z6841 Body Mass Index (BMI) 40.0 and over, adult: Secondary | ICD-10-CM | POA: Insufficient documentation

## 2017-02-11 DIAGNOSIS — E11319 Type 2 diabetes mellitus with unspecified diabetic retinopathy without macular edema: Secondary | ICD-10-CM | POA: Insufficient documentation

## 2017-02-11 DIAGNOSIS — G4730 Sleep apnea, unspecified: Secondary | ICD-10-CM | POA: Insufficient documentation

## 2017-02-11 HISTORY — DX: Reserved for inherently not codable concepts without codable children: IMO0001

## 2017-02-11 HISTORY — DX: Discoid lupus erythematosus: L93.0

## 2017-02-11 HISTORY — DX: Personal history of other diseases of the nervous system and sense organs: Z86.69

## 2017-02-11 HISTORY — DX: Type 2 diabetes mellitus with unspecified diabetic retinopathy without macular edema: E11.319

## 2017-02-11 HISTORY — DX: Personal history of other venous thrombosis and embolism: Z86.718

## 2017-02-11 HISTORY — DX: Unspecified osteoarthritis, unspecified site: M19.90

## 2017-02-11 HISTORY — DX: Complete loss of teeth, unspecified cause, unspecified class: K08.109

## 2017-02-11 HISTORY — PX: TRIGGER FINGER RELEASE: SHX641

## 2017-02-11 HISTORY — DX: Long term (current) use of insulin: Z79.4

## 2017-02-11 HISTORY — DX: Other seasonal allergic rhinitis: J30.2

## 2017-02-11 HISTORY — DX: Type 2 diabetes mellitus without complications: E11.9

## 2017-02-11 HISTORY — DX: Presence of dental prosthetic device (complete) (partial): Z97.2

## 2017-02-11 HISTORY — DX: Cough: R05

## 2017-02-11 HISTORY — DX: Trigger finger, left middle finger: M65.332

## 2017-02-11 LAB — GLUCOSE, CAPILLARY
GLUCOSE-CAPILLARY: 112 mg/dL — AB (ref 65–99)
Glucose-Capillary: 116 mg/dL — ABNORMAL HIGH (ref 65–99)

## 2017-02-11 SURGERY — RELEASE, A1 PULLEY, FOR TRIGGER FINGER
Anesthesia: Monitor Anesthesia Care | Site: Hand | Laterality: Left

## 2017-02-11 MED ORDER — CHLORHEXIDINE GLUCONATE 4 % EX LIQD: 60.0000 mL | Freq: Once | CUTANEOUS | Status: DC

## 2017-02-11 MED ORDER — CEFAZOLIN SODIUM-DEXTROSE 2-4 GM/100ML-% IV SOLN
INTRAVENOUS | Status: AC
  Filled 2017-02-11: qty 100

## 2017-02-11 MED ORDER — PROPOFOL 500 MG/50ML IV EMUL
INTRAVENOUS | Status: AC
  Filled 2017-02-11: qty 50

## 2017-02-11 MED ORDER — PROPOFOL 10 MG/ML IV BOLUS
INTRAVENOUS | Status: AC
  Filled 2017-02-11: qty 20

## 2017-02-11 MED ORDER — LIDOCAINE HCL (PF) 0.5 % IJ SOLN
INTRAMUSCULAR | Status: DC | PRN
  Administered 2017-02-11: 30 mL via INTRAVENOUS

## 2017-02-11 MED ORDER — LACTATED RINGERS IV SOLN
INTRAVENOUS | Status: DC | PRN
  Administered 2017-02-11: 08:00:00 via INTRAVENOUS

## 2017-02-11 MED ORDER — FENTANYL CITRATE (PF) 100 MCG/2ML IJ SOLN: 25.0000 ug | INTRAMUSCULAR | Status: DC | PRN

## 2017-02-11 MED ORDER — BUPIVACAINE HCL (PF) 0.25 % IJ SOLN
INTRAMUSCULAR | Status: DC | PRN
  Administered 2017-02-11: 7 mL

## 2017-02-11 MED ORDER — MEPERIDINE HCL 25 MG/ML IJ SOLN: 6.2500 mg | INTRAMUSCULAR | Status: DC | PRN

## 2017-02-11 MED ORDER — HYDROCODONE-ACETAMINOPHEN 5-325 MG PO TABS: ORAL_TABLET | ORAL | 0 refills | Status: DC

## 2017-02-11 MED ORDER — FENTANYL CITRATE (PF) 100 MCG/2ML IJ SOLN
INTRAMUSCULAR | Status: DC | PRN
  Administered 2017-02-11 (×2): 50 ug via INTRAVENOUS

## 2017-02-11 MED ORDER — SCOPOLAMINE 1 MG/3DAYS TD PT72: 1.0000 | MEDICATED_PATCH | Freq: Once | TRANSDERMAL | Status: DC | PRN

## 2017-02-11 MED ORDER — ONDANSETRON HCL 4 MG/2ML IJ SOLN
INTRAMUSCULAR | Status: AC
  Filled 2017-02-11: qty 2

## 2017-02-11 MED ORDER — CEFAZOLIN SODIUM-DEXTROSE 2-4 GM/100ML-% IV SOLN
2.0000 g | INTRAVENOUS | Status: AC
  Administered 2017-02-11: 2 g via INTRAVENOUS

## 2017-02-11 MED ORDER — LIDOCAINE 2% (20 MG/ML) 5 ML SYRINGE
INTRAMUSCULAR | Status: AC
  Filled 2017-02-11: qty 5

## 2017-02-11 MED ORDER — MIDAZOLAM HCL 2 MG/2ML IJ SOLN: 1.0000 mg | INTRAMUSCULAR | Status: DC | PRN

## 2017-02-11 MED ORDER — ONDANSETRON HCL 4 MG/2ML IJ SOLN
INTRAMUSCULAR | Status: DC | PRN
  Administered 2017-02-11: 4 mg via INTRAVENOUS

## 2017-02-11 MED ORDER — FENTANYL CITRATE (PF) 100 MCG/2ML IJ SOLN
INTRAMUSCULAR | Status: AC
  Filled 2017-02-11: qty 2

## 2017-02-11 MED ORDER — FENTANYL CITRATE (PF) 100 MCG/2ML IJ SOLN: 50.0000 ug | INTRAMUSCULAR | Status: DC | PRN

## 2017-02-11 MED ORDER — PROPOFOL 500 MG/50ML IV EMUL
INTRAVENOUS | Status: DC | PRN
  Administered 2017-02-11: 25 ug/kg/min via INTRAVENOUS

## 2017-02-11 MED ORDER — BUPIVACAINE HCL (PF) 0.25 % IJ SOLN
INTRAMUSCULAR | Status: AC
  Filled 2017-02-11: qty 120

## 2017-02-11 MED ORDER — LACTATED RINGERS IV SOLN
INTRAVENOUS | Status: DC
  Administered 2017-02-11: 09:00:00 via INTRAVENOUS
  Administered 2017-02-11: 10 mL/h via INTRAVENOUS

## 2017-02-11 SURGICAL SUPPLY — 33 items
BANDAGE COBAN STERILE 2 (GAUZE/BANDAGES/DRESSINGS) ×3 IMPLANT
BLADE SURG 15 STRL LF DISP TIS (BLADE) ×2 IMPLANT
BLADE SURG 15 STRL SS (BLADE) ×6
BNDG CMPR 9X4 STRL LF SNTH (GAUZE/BANDAGES/DRESSINGS)
BNDG ESMARK 4X9 LF (GAUZE/BANDAGES/DRESSINGS) IMPLANT
CHLORAPREP W/TINT 26ML (MISCELLANEOUS) ×3 IMPLANT
CORD BIPOLAR FORCEPS 12FT (ELECTRODE) ×3 IMPLANT
COVER BACK TABLE 60X90IN (DRAPES) ×3 IMPLANT
COVER MAYO STAND STRL (DRAPES) ×3 IMPLANT
CUFF TOURNIQUET SINGLE 18IN (TOURNIQUET CUFF) ×3 IMPLANT
DRAPE EXTREMITY T 121X128X90 (DRAPE) ×3 IMPLANT
DRAPE SURG 17X23 STRL (DRAPES) ×3 IMPLANT
GAUZE SPONGE 4X4 12PLY STRL (GAUZE/BANDAGES/DRESSINGS) ×3 IMPLANT
GAUZE XEROFORM 1X8 LF (GAUZE/BANDAGES/DRESSINGS) ×3 IMPLANT
GLOVE BIO SURGEON STRL SZ7.5 (GLOVE) ×3 IMPLANT
GLOVE BIOGEL PI IND STRL 7.0 (GLOVE) IMPLANT
GLOVE BIOGEL PI IND STRL 8 (GLOVE) ×1 IMPLANT
GLOVE BIOGEL PI INDICATOR 7.0 (GLOVE) ×4
GLOVE BIOGEL PI INDICATOR 8 (GLOVE) ×2
GLOVE ECLIPSE 6.5 STRL STRAW (GLOVE) ×2 IMPLANT
GOWN STRL REUS W/ TWL LRG LVL3 (GOWN DISPOSABLE) ×1 IMPLANT
GOWN STRL REUS W/TWL LRG LVL3 (GOWN DISPOSABLE) ×3
GOWN STRL REUS W/TWL XL LVL3 (GOWN DISPOSABLE) ×3 IMPLANT
NDL HYPO 25X1 1.5 SAFETY (NEEDLE) ×1 IMPLANT
NEEDLE HYPO 25X1 1.5 SAFETY (NEEDLE) ×3 IMPLANT
NS IRRIG 1000ML POUR BTL (IV SOLUTION) ×3 IMPLANT
PACK BASIN DAY SURGERY FS (CUSTOM PROCEDURE TRAY) ×3 IMPLANT
STOCKINETTE 4X48 STRL (DRAPES) ×3 IMPLANT
SUT ETHILON 4 0 PS 2 18 (SUTURE) ×3 IMPLANT
SYR BULB 3OZ (MISCELLANEOUS) ×3 IMPLANT
SYR CONTROL 10ML LL (SYRINGE) ×3 IMPLANT
TOWEL OR 17X24 6PK STRL BLUE (TOWEL DISPOSABLE) ×6 IMPLANT
UNDERPAD 30X30 (UNDERPADS AND DIAPERS) ×3 IMPLANT

## 2017-02-11 NOTE — Anesthesia Postprocedure Evaluation (Signed)
Anesthesia Post Note  [name]  Procedure(s) Performed: RELEASE TRIGGER FINGER/A-1 PULLEY (Left Hand)     Patient location during evaluation: PACU Anesthesia Type: MAC Level of consciousness: awake and alert Pain management: pain level controlled Vital Signs Assessment: post-procedure vital signs reviewed and stable Respiratory status: spontaneous breathing, nonlabored ventilation, respiratory function stable and patient connected to nasal cannula oxygen Cardiovascular status: stable and blood pressure returned to baseline Postop Assessment: no apparent nausea or vomiting Anesthetic complications: no    Last Vitals:  Vitals:   02/11/17 0915 02/11/17 0930  BP: (!) 128/52 (!) 111/55  Pulse: 69 61  Resp: 13 15  Temp: 36.7 C   SpO2: 100% 100%    Last Pain:  Vitals:   02/11/17 0727  TempSrc: Oral                 Salim Forero

## 2017-02-11 NOTE — Brief Op Note (Signed)
02/11/2017  9:10 AM  PATIENT:  Kaitlyn Jackson  71 y.o. female  PRE-OPERATIVE DIAGNOSIS:  LEFT LONG TRIGGER FINGER   POST-OPERATIVE DIAGNOSIS:  LEFT LONG TRIGGER FINGER   PROCEDURE:  Procedure(s): RELEASE TRIGGER FINGER/A-1 PULLEY (Left)  SURGEON:  Surgeon(s) and Role:    * Leanora Cover, MD - Primary  PHYSICIAN ASSISTANT:   ASSISTANTS: none   ANESTHESIA:   Bier block with sedation  EBL:  Minimal   BLOOD ADMINISTERED:none  DRAINS: none   LOCAL MEDICATIONS USED:  MARCAINE     SPECIMEN:  No Specimen  DISPOSITION OF SPECIMEN:  N/A  COUNTS:  YES  TOURNIQUET:   Total Tourniquet Time Documented: Forearm (Left) - 20 minutes Total: Forearm (Left) - 20 minutes   DICTATION: .Note written in EPIC  PLAN OF CARE: Discharge to home after PACU  PATIENT DISPOSITION:  PACU - hemodynamically stable.

## 2017-02-11 NOTE — Transfer of Care (Signed)
Immediate Anesthesia Transfer of Care Note  [name]  Procedure(s) Performed: RELEASE TRIGGER FINGER/A-1 PULLEY (Left Hand)  Patient Location: PACU  Anesthesia Type:Bier block  Level of Consciousness: awake and patient cooperative  Airway & Oxygen Therapy: Patient Spontanous Breathing and Patient connected to face mask oxygen  Post-op Assessment: Report given to RN and Post -op Vital signs reviewed and stable  Post vital signs: Reviewed and stable  Last Vitals:  Vitals:   02/11/17 0727  BP: (!) 125/49  Pulse: 77  Resp: 20  Temp: 36.6 C  SpO2: 99%    Last Pain:  Vitals:   02/11/17 0727  TempSrc: Oral         Complications: No apparent anesthesia complications

## 2017-02-11 NOTE — Op Note (Signed)
02/11/2017 [facility]  Operative Note  PREOPERATIVE DIAGNOSIS: LEFT LONG TRIGGER FINGER   POSTOPERATIVE DIAGNOSIS:  LEFT LONG TRIGGER FINGER   PROCEDURE: Procedure(s): RELEASE TRIGGER FINGER/A-1 PULLEY  SURGEON:  Leanora Cover, MD  ASSISTANT:  none.  ANESTHESIA:  Bier block and sedation.  IV FLUIDS:  Per anesthesia flow sheet.  ESTIMATED BLOOD LOSS:  Minimal.  COMPLICATIONS:  None.  SPECIMENS:  None.  TOURNIQUET TIME:  Total Tourniquet Time Documented: Forearm (Left) - 20 minutes Total: Forearm (Left) - 20 minutes   DISPOSITION:  Stable to PACU.  LOCATION: [facility]  INDICATIONS: Kaitlyn Jackson is a 71 y.o. female with triggering left long finger.  This has recurred after injection.  She wishes to have a left long finger trigger release.  Risks, benefits and alternatives of surgery were discussed including the risk of blood loss, infection, damage to nerves, vessels, tendons, ligaments, bone, failure of surgery, need for additional surgery, complications with wound healing, continued pain, continued triggering and need for repeat surgery.  She voiced understanding of these risks and elected to proceed.  OPERATIVE COURSE:  After being identified preoperatively by myself, the patient and I agreed upon the procedure and site of procedure.  The surgical site was marked. The risks, benefits, and alternatives of surgery were reviewed and she wished to proceed.  Surgical consent had been signed. She was given IV Ancef as preoperative antibiotic prophylaxis. She was transported to the operating room and placed on the operating room table in supine position with the Left upper extremity on an arm board. Bier block and sedation was induced by the anesthesiologist.  The Left upper extremity was prepped and draped in normal sterile orthopedic fashion. A surgical pause was performed between surgeons, anesthesia, and operating room staff, and all were in  agreement as to the patient, procedure, and site of procedure.  Tourniquet at the proximal aspect of the forearm had been inflated for the Bier block.   An incision was made at the volar aspect of the MP joint of the long finger.  This was carried into the subcutaneous tissues by preading technique.  Bipolar electrocautery was used to obtain hemostasis.  The radial and ulnar digital nerves were protected throughout the case. The flexor sheath was identified.  The A1 pulley was identified and sharply incised.  It was released in its entirety.  The proximal 1-2 mm of the A2 pulley was vented to allow better excursion of the tendons.  The finger was placed through a range of motion and there was noted to be no catching.  The tendons were brought through the wound and any adherences released.  The wound was then copiously irrigated with sterile saline. It was closed with 4-0 nylon in a horizontal mattress fashion.  It was injected with 0.25% plain Marcaine to aid in postoperative analgesia.  It was dressed with sterile Xeroform, 4x4s, and wrapped lightly with a Coban dressing.  Tourniquet was deflated at 20 minutes.  The fingertips were pink with brisk capillary refill after deflation of the tourniquet.  The operative drapes were broken down and the patient was awoken from anesthesia safely.  She was transferred back to the stretcher and taken to the PACU in stable condition.   I will see her back in the office in 1 week for postoperative followup.  I will give her a prescription for norco 5/325 1-2 tabs po q6 hours prn pain, dispense #20.    Tennis Must, MD Electronically signed,  02/11/17    

## 2017-02-11 NOTE — Anesthesia Procedure Notes (Addendum)
Anesthesia Regional Block: Bier block (IV Regional)   Pre-Anesthetic Checklist: ,, timeout performed, Correct Patient, Correct Site, Correct Laterality, Correct Procedure, Correct Position, site marked, Risks and benefits discussed,  Surgical consent,  Pre-op evaluation,  At surgeon's request and post-op pain management  Laterality: Left  Prep: chloraprep        Narrative:  Start time: 02/11/2017 8:44 AM End time: 02/11/2017 8:46 AM  Events: blood aspirated,,,,,,,,,,

## 2017-02-11 NOTE — Anesthesia Procedure Notes (Signed)
Procedure Name: MAC Date/Time: 02/11/2017 8:39 AM Performed by: Marrianne Mood, CRNA Pre-anesthesia Checklist: Patient identified, Timeout performed, Emergency Drugs available, Suction available and Patient being monitored Patient Re-evaluated:Patient Re-evaluated prior to induction Oxygen Delivery Method: Simple face mask Preoxygenation: Pre-oxygenation with 100% oxygen

## 2017-02-11 NOTE — H&P (Signed)
TRULEE HAMSTRA is an 71 y.o. female.   Chief Complaint: left long finger trigger digit HPI: 71 yo female with triggering left long finger.  This has been injected without lasting relief.  She wishes to have a trigger release.  Allergies: No Known Allergies  Past Medical History:  Diagnosis Date  . Clotting disorder ([place])   . Cough 02/08/2017  . Diabetic retinopathy ([place])   . 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 Full dentures   . GERD (gastroesophageal reflux disease)   . Heart murmur   . History of blood clots 1996   groin  . History of glaucoma    had laser correction  . Hypertension    states under control with meds., has been on med. since 1996  . Insulin dependent diabetes mellitus ([place])   . Mitral valve prolapse   . Osteoarthritis    bilateral knee  . Seasonal allergies   . Sleep apnea    no CPAP use in several months, per pt.  . Trigger finger, left middle finger 01/2017    Past Surgical History:  Procedure Laterality Date  . BACK SURGERY     lower - removed  cysts  . CATARACT EXTRACTION W/ INTRAOCULAR LENS  IMPLANT, BILATERAL Bilateral   . GLAUCOMA SURGERY Bilateral    laser  . HEMILAMINOTOMY LUMBAR SPINE Right 01/30/2009   L5  . TOTAL KNEE ARTHROPLASTY Right 09/22/2007  . TOTAL KNEE ARTHROPLASTY Left 02/01/2007  . TRIGGER FINGER RELEASE Right 12/14/2013   Procedure: RELEASE TRIGGER FINGER/A-1 PULLEY RIGHT RING FINGER;  Surgeon: Daryll Brod, MD;  Location: [facility];  Service: Orthopedics;  Laterality: Right;    Family History: Family History  Problem Relation Age of Onset  . Cancer Mother        colon  . Cancer Cousin        lung    Social History:   reports that  has never smoked. she has never used smokeless tobacco. She reports that she does not drink alcohol or use drugs.  Medications: Medications Prior to Admission  Medication Sig Dispense Refill  . acetaminophen (TYLENOL) 500 MG tablet Take 500 mg by mouth  every 6 (six) hours as needed for moderate pain.    Marland Kitchen atorvastatin (LIPITOR) 20 MG tablet Take 10 mg by mouth at bedtime.    . benazepril (LOTENSIN) 40 MG tablet Take 40 mg by mouth daily.    . cholecalciferol (VITAMIN D) 1000 UNITS tablet Take 1,000 Units by mouth daily.    . cyclobenzaprine (FLEXERIL) 10 MG tablet Take 1 tablet (10 mg total) by mouth 2 (two) times daily as needed for muscle spasms. 20 tablet 0  . desonide (DESOWEN) 0.05 % lotion Apply 1 application topically 2 (two) times daily.    Marland Kitchen esomeprazole (NEXIUM) 40 MG capsule Take 40 mg by mouth daily before breakfast.    . fexofenadine (ALLEGRA) 180 MG tablet Take 180 mg by mouth daily.    . fluticasone (FLONASE) 50 MCG/ACT nasal spray Place 1 spray into both nostrils daily.    . folic acid (FOLVITE) 1 MG tablet Take 1 mg by mouth daily.    . furosemide (LASIX) 40 MG tablet Take 40 mg by mouth daily.    Marland Kitchen HUMALOG MIX 75/25 KWIKPEN (75-25) 100 UNIT/ML Kwikpen INJECT 60 UNITS SQ EVERY MORNING  3  . insulin NPH-insulin regular (NOVOLIN 70/30) (70-30) 100 UNIT/ML injection Inject 10 Units into the skin 3 (three) times daily.  Takes 30 units in the morning and 10 units at night    . Insulin Pen Needle (FIFTY50 PEN NEEDLES) 31G X 8 MM MISC BD Ultra-Fine Short Pen Needle 31 gauge x 5/16"    . Insulin Pen Needle (NOVOFINE PLUS) 32G X 4 MM MISC NovoFine Plus 32 gauge x 1/6" needle  USE 2 TIMES DAILY    . Insulin Pen Needle (NOVOFINE) 30G X 8 MM MISC NovoFine 30 30 gauge x 1/3" needle    . Lancet Devices ([employer] LANCING DEVICE) MISC Accu-Chek FastClix Lancing Device    . methocarbamol (ROBAXIN) 500 MG tablet Take 1 tablet (500 mg total) by mouth every 8 (eight) hours as needed for muscle spasms. 20 tablet 0  . methotrexate (RHEUMATREX) 2.5 MG tablet methotrexate sodium 2.5 mg tablet    . montelukast (SINGULAIR) 10 MG tablet Take 10 mg by mouth at bedtime.    . Multiple Vitamin (MULTIVITAMIN) tablet Take 1 tablet by mouth daily.    . pregabalin  (LYRICA) 100 MG capsule Take 100 mg by mouth 2 (two) times daily.    . sitaGLIPtin-metformin (JANUMET) 50-1000 MG tablet Take 1 tablet by mouth daily.    . Sulfacetamide Sodium (SODIUM SULFACETAMIDE EX) Apply 1 application topically every 6 (six) hours as needed (for itching).     . traMADol (ULTRAM) 50 MG tablet Take 2 tablets (100 mg total) by mouth every 6 (six) hours as needed. 10 tablet 0  . verapamil (COVERA HS) 180 MG (CO) 24 hr tablet Take 180 mg by mouth 2 (two) times daily.    Marland Kitchen warfarin (COUMADIN) 10 MG tablet Take 10 mg by mouth every other day.  3  . warfarin (COUMADIN) 7.5 MG tablet TAKE 1 TABLET EVERY OTHER DAY ALTERNATING WITH 10 MG  3  . Blood Glucose Monitoring Suppl (GLUCOCOM BLOOD GLUCOSE MONITOR) DEVI Accu-Chek Aviva Plus Meter      Results for orders placed or performed during the hospital encounter of 02/11/17 (from the past 48 hour(s))  Basic metabolic panel     Status: Abnormal   Collection Time: 02/10/17 12:07 PM  Result Value Ref Range   Sodium 139 135 - 145 mmol/L   Potassium 3.8 3.5 - 5.1 mmol/L   Chloride 102 101 - 111 mmol/L   CO2 28 22 - 32 mmol/L   Glucose, Bld 226 (H) 65 - 99 mg/dL   BUN 19 6 - 20 mg/dL   Creatinine, Ser 1.04 (H) 0.44 - 1.00 mg/dL   Calcium 9.1 8.9 - 10.3 mg/dL   GFR calc non Af Amer 52 (L) >60 mL/min   GFR calc Af Amer >60 >60 mL/min    Comment: (NOTE) The eGFR has been calculated using the CKD EPI equation. This calculation has not been validated in all clinical situations. eGFR's persistently <60 mL/min signify possible Chronic Kidney Disease.    Anion gap 9 5 - 15  PT- INR at PAT visit (Pre-admission Testing)     Status: None   Collection Time: 02/10/17 12:07 PM  Result Value Ref Range   Prothrombin Time 12.9 11.4 - 15.2 seconds   INR 0.98   Glucose, capillary     Status: Abnormal   Collection Time: 02/11/17  7:52 AM  Result Value Ref Range   Glucose-Capillary 112 (H) 65 - 99 mg/dL    No results found.   A  comprehensive review of systems was negative.  Blood pressure (!) 125/49, pulse 77, temperature 97.8 F (36.6 C), temperature source Oral, resp. rate  20, height _0  (1.626 m), weight 108.4 kg (239 lb), SpO2 99 %.  General appearance: alert, cooperative and appears stated age Head: Normocephalic, without obvious abnormality, atraumatic Neck: supple, symmetrical, trachea midline Resp: clear to auscultation bilaterally Cardio: regular rate and rhythm GI: non-tender Extremities: Intact sensation and capillary refill all digits.  +epl/fpl/io.  No wounds.  Pulses: 2+ and symmetric Skin: Skin color, texture, turgor normal. No rashes or lesions Neurologic: Grossly normal Incision/Wound:none  Assessment/Plan Left long finger trigger digit.  Non operative and operative treatment options were discussed with the patient and patient wishes to proceed with operative treatment. Risks, benefits, and alternatives of surgery were discussed and the patient agrees with the plan of care.   Lyndal Alamillo R 02/11/2017, 8:31 AM

## 2017-02-11 NOTE — Discharge Instructions (Addendum)

## 2017-02-12 ENCOUNTER — Encounter (HOSPITAL_BASED_OUTPATIENT_CLINIC_OR_DEPARTMENT_OTHER): Payer: Self-pay | Admitting: Orthopedic Surgery

## 2017-04-13 ENCOUNTER — Encounter (INDEPENDENT_AMBULATORY_CARE_PROVIDER_SITE_OTHER): Payer: Medicare Other | Admitting: Ophthalmology

## 2017-04-13 DIAGNOSIS — H43813 Vitreous degeneration, bilateral: Secondary | ICD-10-CM

## 2017-04-13 DIAGNOSIS — I10 Essential (primary) hypertension: Secondary | ICD-10-CM

## 2017-04-13 DIAGNOSIS — E113292 Type 2 diabetes mellitus with mild nonproliferative diabetic retinopathy without macular edema, left eye: Secondary | ICD-10-CM

## 2017-04-13 DIAGNOSIS — H35033 Hypertensive retinopathy, bilateral: Secondary | ICD-10-CM | POA: Diagnosis not present

## 2017-04-13 DIAGNOSIS — H33302 Unspecified retinal break, left eye: Secondary | ICD-10-CM | POA: Diagnosis not present

## 2017-04-13 DIAGNOSIS — E11311 Type 2 diabetes mellitus with unspecified diabetic retinopathy with macular edema: Secondary | ICD-10-CM | POA: Diagnosis not present

## 2017-04-13 DIAGNOSIS — E113311 Type 2 diabetes mellitus with moderate nonproliferative diabetic retinopathy with macular edema, right eye: Secondary | ICD-10-CM | POA: Diagnosis not present

## 2017-04-16 DIAGNOSIS — E0800 Diabetes mellitus due to underlying condition with hyperosmolarity without nonketotic hyperglycemic-hyperosmolar coma (NKHHC): Secondary | ICD-10-CM | POA: Diagnosis not present

## 2017-04-16 DIAGNOSIS — E0840 Diabetes mellitus due to underlying condition with diabetic neuropathy, unspecified: Secondary | ICD-10-CM | POA: Diagnosis not present

## 2017-04-16 DIAGNOSIS — H26233 Glaucomatous flecks (subcapsular), bilateral: Secondary | ICD-10-CM | POA: Diagnosis not present

## 2017-04-16 DIAGNOSIS — I82409 Acute embolism and thrombosis of unspecified deep veins of unspecified lower extremity: Secondary | ICD-10-CM | POA: Diagnosis not present

## 2017-04-16 DIAGNOSIS — I10 Essential (primary) hypertension: Secondary | ICD-10-CM | POA: Diagnosis not present

## 2017-04-20 ENCOUNTER — Ambulatory Visit: Payer: Medicare Other | Admitting: Podiatry

## 2017-04-24 DIAGNOSIS — I10 Essential (primary) hypertension: Secondary | ICD-10-CM | POA: Diagnosis not present

## 2017-04-24 DIAGNOSIS — M130 Polyarthritis, unspecified: Secondary | ICD-10-CM | POA: Diagnosis not present

## 2017-04-24 DIAGNOSIS — I82409 Acute embolism and thrombosis of unspecified deep veins of unspecified lower extremity: Secondary | ICD-10-CM | POA: Diagnosis not present

## 2017-04-24 DIAGNOSIS — E0800 Diabetes mellitus due to underlying condition with hyperosmolarity without nonketotic hyperglycemic-hyperosmolar coma (NKHHC): Secondary | ICD-10-CM | POA: Diagnosis not present

## 2017-05-03 ENCOUNTER — Ambulatory Visit (INDEPENDENT_AMBULATORY_CARE_PROVIDER_SITE_OTHER): Payer: Medicare Other | Admitting: Podiatry

## 2017-05-03 ENCOUNTER — Encounter: Payer: Self-pay | Admitting: Podiatry

## 2017-05-03 DIAGNOSIS — M79676 Pain in unspecified toe(s): Secondary | ICD-10-CM

## 2017-05-03 DIAGNOSIS — E0843 Diabetes mellitus due to underlying condition with diabetic autonomic (poly)neuropathy: Secondary | ICD-10-CM | POA: Diagnosis not present

## 2017-05-03 DIAGNOSIS — B351 Tinea unguium: Secondary | ICD-10-CM

## 2017-05-05 NOTE — Progress Notes (Signed)
   SUBJECTIVE Patient with a history of diabetes mellitus presents to office today complaining of elongated, thickened nails. Pain while ambulating in shoes. Patient is unable to trim their own nails.   Past Medical History:  Diagnosis Date  . Clotting disorder ([place])   . Cough 02/08/2017  . Diabetic retinopathy ([place])   . 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 Full dentures   . GERD (gastroesophageal reflux disease)   . Heart murmur   . History of blood clots 1996   groin  . History of glaucoma    had laser correction  . Hypertension    states under control with meds., has been on med. since 1996  . Insulin dependent diabetes mellitus ([place])   . Mitral valve prolapse   . Osteoarthritis    bilateral knee  . Seasonal allergies   . Sleep apnea    no CPAP use in several months, per pt.  . Trigger finger, left middle finger 01/2017    OBJECTIVE General Patient is awake, alert, and oriented x 3 and in no acute distress. Derm Skin is dry and supple bilateral. Negative open lesions or macerations. Remaining integument unremarkable. Nails are tender, long, thickened and dystrophic with subungual debris, consistent with onychomycosis, 1-5 bilateral. No signs of infection noted. Vasc  DP and PT pedal pulses palpable bilaterally. Temperature gradient within normal limits.  Neuro Epicritic and protective threshold sensation diminished bilaterally.  Musculoskeletal Exam No symptomatic pedal deformities noted bilateral. Muscular strength within normal limits.  ASSESSMENT 1. Diabetes Mellitus w/ peripheral neuropathy 2. Onychomycosis of nail due to dermatophyte bilateral 3. Pain in foot bilateral  PLAN OF CARE 1. Patient evaluated today. 2. Instructed to maintain good pedal hygiene and foot care. Stressed importance of controlling blood sugar.  3. Mechanical debridement of nails 1-5 bilaterally performed using a nail nipper. Filed with dremel without incident.  4. Return to  clinic in 3 mos.     Edrick Kins, DPM [facility]  Dr. Edrick Kins, [address]                                        [place], [place] [address]                Office [phone]  Fax [phone]

## 2017-05-17 DIAGNOSIS — Z7901 Long term (current) use of anticoagulants: Secondary | ICD-10-CM | POA: Diagnosis not present

## 2017-05-18 ENCOUNTER — Ambulatory Visit (INDEPENDENT_AMBULATORY_CARE_PROVIDER_SITE_OTHER): Payer: Medicare Other | Admitting: Orthopaedic Surgery

## 2017-05-18 ENCOUNTER — Encounter (INDEPENDENT_AMBULATORY_CARE_PROVIDER_SITE_OTHER): Payer: Self-pay | Admitting: Orthopaedic Surgery

## 2017-05-18 VITALS — BP 134/64 | HR 72 | Ht 64.0 in | Wt 239.0 lb

## 2017-05-18 DIAGNOSIS — T8484XD Pain due to internal orthopedic prosthetic devices, implants and grafts, subsequent encounter: Secondary | ICD-10-CM | POA: Diagnosis not present

## 2017-05-18 DIAGNOSIS — Z96651 Presence of right artificial knee joint: Secondary | ICD-10-CM | POA: Diagnosis not present

## 2017-05-18 NOTE — Progress Notes (Signed)
Office Visit Note   [name]           Date of Birth: December 30, 1945           MRN: [id number] Visit Date: 05/18/2017              Requested by: Foye Spurling, MD [id number] [place], [place] [address] PCP: Lucianne Lei, MD   Assessment & Plan: Visit Diagnoses:  1. Pain due to total right knee replacement, subsequent encounter     Plan:  #1: Instructed in appropriate exercises to help strengthen her knee.  Also suggested the [facility] for water aerobics, water walking, and exercise bike. #2: She needs to decrease her weight and would prefer that her BMI be under 40 for possible revision total knee replacement on the right in the future as needed  Follow-Up Instructions: Return if symptoms worsen or fail to improve.   Face-to-face time spent with patient was greater than 30 minutes.  Greater than 50% of the time was spent in counseling and coordination of care.  We had a long discussion about her weight as well as her knee and the findings we had on her x-rays.  She is now understanding.  Orders:  No orders of the defined types were placed in this encounter.  No orders of the defined types were placed in this encounter.     Procedures: No procedures performed   Clinical Data: No additional findings.   Subjective: Chief Complaint  Patient presents with  . Right Knee - Pain    STILL HAVING PAIN IN RIGHT KNEE, HAD INJECTION 3 MONTHS IN THE KNEE.    HPI  [name] is a 72 year old African-American female who presents today for evaluation of her right total knee replacement which had been placed back in September 22, 2007.  Apparently she is seeing a new primary care physician and wants her to be able to do some walking to try to lose weight and become more healthy.  She states though that this is difficult to because of her problems with her right total knee arthroplasty with there may be some loosening which is causing her more pain in the knee.  She  was seen back in November 2018 at that time an aspiration of the knee was performed and sent for laboratory studies.  However someone canceled the laboratory studies and no analysis was performed.  She did however have a corticosteroid injection to the knee which gave her almost a month of good results.  He returns today still having pain and discomfort in the right knee.  Review of Systems  Constitutional: Negative for fatigue and fever.  HENT: Negative for ear pain and tinnitus.   Eyes: Negative for pain.  Respiratory: Negative for cough and shortness of breath.   Cardiovascular: Negative for chest pain, palpitations and leg swelling.  Gastrointestinal: Negative for blood in stool, constipation and diarrhea.  Genitourinary: Negative for dysuria.  Musculoskeletal: Positive for back pain. Negative for joint swelling, myalgias and neck pain.  Skin: Negative for rash and wound.  Allergic/Immunologic: Negative for food allergies.  Neurological: Positive for weakness. Negative for dizziness, light-headedness, numbness and headaches.  Hematological: Bruises/bleeds easily.  Psychiatric/Behavioral: The patient is not nervous/anxious.      Objective: Vital Signs: BP 134/64 (BP Location: Right Arm, Patient Position: Sitting, Cuff Size: Normal)   Pulse 72   Ht 5\' 4"  (1.626 m)   Wt 239 lb (108.4 kg)   BMI 41.02 kg/m  Physical Exam  Constitutional: She is oriented to person, place, and time. She appears well-developed and well-nourished.  HENT:  Head: Normocephalic and atraumatic.  Eyes: EOM are normal. Pupils are equal, round, and reactive to light.  Pulmonary/Chest: Effort normal.  Neurological: She is alert and oriented to person, place, and time.  Skin: Skin is warm and dry.  Psychiatric: She has a normal mood and affect. Her behavior is normal. Judgment and thought content normal.    Ortho Exam  Today range of motion from near full extension to about 95-100 degrees.  She does have a  trace of effusion.  She does have actually good ligament stability with only little bit of varus and valgus opening with stress..  No crepitance with range of motion.  Calf is supple nontender.  Neurovascular intact distally.  No warmth or erythema noted.  Specialty Comments:  No specialty comments available.  Imaging: No results found.   PMFS History: Patient Active Problem List   Diagnosis Date Noted  . 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 High risk medication use 01/06/2016  . 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 Vitamin D deficiency 01/06/2016  . Osteopenia 03/14/2012  . Diabetes mellitus type 2 in obese ([place]) 03/14/2012  . 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 Hypertension 03/14/2012   Past Medical History:  Diagnosis Date  . Clotting disorder ([place])   . Cough 02/08/2017  . Diabetic retinopathy ([place])   . 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 Full dentures   . GERD (gastroesophageal reflux disease)   . Heart murmur   . History of blood clots 1996   groin  . History of glaucoma    had laser correction  . Hypertension    states under control with meds., has been on med. since 1996  . Insulin dependent diabetes mellitus ([place])   . Mitral valve prolapse   . Osteoarthritis    bilateral knee  . Seasonal allergies   . Sleep apnea    no CPAP use in several months, per pt.  . Trigger finger, left middle finger 01/2017    Family History  Problem Relation Age of Onset  . Cancer Mother        colon  . Cancer Cousin        lung    Past Surgical History:  Procedure Laterality Date  . BACK SURGERY     lower - removed  cysts  . CATARACT EXTRACTION W/ INTRAOCULAR LENS  IMPLANT, BILATERAL Bilateral   . GLAUCOMA SURGERY Bilateral    laser  . HEMILAMINOTOMY LUMBAR SPINE Right 01/30/2009   L5  . TOTAL KNEE ARTHROPLASTY Right 09/22/2007  . TOTAL KNEE ARTHROPLASTY  Left 02/01/2007  . TRIGGER FINGER RELEASE Right 12/14/2013   Procedure: RELEASE TRIGGER FINGER/A-1 PULLEY RIGHT RING FINGER;  Surgeon: Daryll Brod, MD;  Location: [facility];  Service: Orthopedics;  Laterality: Right;  . TRIGGER FINGER RELEASE Left 02/11/2017   Procedure: RELEASE TRIGGER FINGER/A-1 PULLEY;  Surgeon: Leanora Cover, MD;  Location: [facility];  Service: Orthopedics;  Laterality: Left;   Social History   Occupational History  . Not on file  Tobacco Use  . Smoking status: Never Smoker  . Smokeless tobacco: Never Used  Substance and Sexual Activity  . Alcohol use: No  . Drug use: No  . Sexual activity: No    Birth control/protection: Post-menopausal

## 2017-05-20 DIAGNOSIS — H401231 Low-tension glaucoma, bilateral, mild stage: Secondary | ICD-10-CM | POA: Diagnosis not present

## 2017-05-20 DIAGNOSIS — E113211 Type 2 diabetes mellitus with mild nonproliferative diabetic retinopathy with macular edema, right eye: Secondary | ICD-10-CM | POA: Diagnosis not present

## 2017-05-20 DIAGNOSIS — H26492 Other secondary cataract, left eye: Secondary | ICD-10-CM | POA: Diagnosis not present

## 2017-05-20 DIAGNOSIS — H35033 Hypertensive retinopathy, bilateral: Secondary | ICD-10-CM | POA: Diagnosis not present

## 2017-05-20 DIAGNOSIS — E113292 Type 2 diabetes mellitus with mild nonproliferative diabetic retinopathy without macular edema, left eye: Secondary | ICD-10-CM | POA: Diagnosis not present

## 2017-06-10 ENCOUNTER — Ambulatory Visit (INDEPENDENT_AMBULATORY_CARE_PROVIDER_SITE_OTHER): Payer: Medicare Other | Admitting: Physical Medicine and Rehabilitation

## 2017-06-10 ENCOUNTER — Ambulatory Visit (INDEPENDENT_AMBULATORY_CARE_PROVIDER_SITE_OTHER): Payer: Medicare Other

## 2017-06-10 ENCOUNTER — Encounter (INDEPENDENT_AMBULATORY_CARE_PROVIDER_SITE_OTHER): Payer: Self-pay | Admitting: Physical Medicine and Rehabilitation

## 2017-06-10 VITALS — BP 129/73 | HR 77 | Temp 97.9°F

## 2017-06-10 DIAGNOSIS — M961 Postlaminectomy syndrome, not elsewhere classified: Secondary | ICD-10-CM | POA: Diagnosis not present

## 2017-06-10 DIAGNOSIS — M4316 Spondylolisthesis, lumbar region: Secondary | ICD-10-CM | POA: Diagnosis not present

## 2017-06-10 DIAGNOSIS — M48061 Spinal stenosis, lumbar region without neurogenic claudication: Secondary | ICD-10-CM

## 2017-06-10 DIAGNOSIS — M47816 Spondylosis without myelopathy or radiculopathy, lumbar region: Secondary | ICD-10-CM | POA: Diagnosis not present

## 2017-06-10 NOTE — Progress Notes (Signed)
Kaitlyn Jackson - 72 y.o. female MRN [id number]  Date of birth: [birth year]  Office Visit Note: Visit Date: 06/10/2017 PCP: Lucianne Lei, MD Referred by: Foye Spurling, MD  Subjective: Chief Complaint  Patient presents with  . Lower Back - Pain   HPI: Kaitlyn Jackson is a very pleasant 72 year old female who comes in today essentially is a follow-up visit although I have not seen her since 2017.  She continues to see Dr. Durward Fortes for orthopedic care and mainly her knees.  She appears that she has had issues with knee replacement that needs a revision although she is morbidly obese and that have been to figure out how to accomplish that.  In any event, she comes in today with worsening axial low back pain quite severe at times worse with standing.  She reports she cannot really stand in the kitchen for any length of time to cook because it is problematic.  She denies any radicular pain down the legs.  She does have diabetes and does take Lyrica.  She also takes anticoagulation.  She is on Coumadin.  She reports no recent injury.  She has had a fall here and there but nothing big.  She continues to try to take care of her husband who has developed heart failure and she is having taken back and forth to the hospital and doctors quite a bit.  She reports that really nothing is been helping her pain except tramadol.  She has been taking tramadol 100 mg along with Tylenol.  She reports that she had to change her primary care physician because he retired.  She is now seeing Dr. Criss Rosales.  She reports that Dr. Criss Rosales had told her that she should really take the tramadol along with the anticoagulation.  She also continues to follow with Dr. Earle Gell in neurosurgery.  Her brief history with [place] is that she had severe facet arthropathy both at L4-5 and L5-S1 with a facet joint cyst on the right that was causing radicular pain at the time.  Dr. Arnoldo Morale did perform a facetectomy and decompression and she is  done well with no radicular pain.  Evidently she had one flareup of severe pain went to the emergency department and ultimately went back to see Dr. Arnoldo Morale who recommended the tramadol and said he would consider surgery but it would be more of a lumbar fusion because she had developed a small listhesis of L4 on L5.  She really does not want any surgery at this point and does not have any radicular complaints and is mostly low back pain.  She does still follow up with him and actually has an appointment coming up soon.  In terms of her back pain again it has not really manifested as a radicular pain or focal weakness.  She said no changes in bowel or bladder function.  She said no fevers or chills or night sweats and she has had no unexplained weight loss.  She is still unfortunately morbidly obese which does not help.  She does try to maintain some level of activity.   Review of Systems  Constitutional: Negative for chills, fever, malaise/fatigue and weight loss.  HENT: Negative for hearing loss and sinus pain.   Eyes: Negative for blurred vision, double vision and photophobia.  Respiratory: Negative for cough and shortness of breath.   Cardiovascular: Negative for chest pain, palpitations and leg swelling.  Gastrointestinal: Negative for abdominal pain, nausea and vomiting.  Genitourinary: Negative for flank  pain.  Musculoskeletal: Positive for back pain and joint pain. Negative for myalgias.  Skin: Negative for itching and rash.  Neurological: Negative for tremors, focal weakness and weakness.  Endo/Heme/Allergies: Negative.   Psychiatric/Behavioral: Negative for depression.  All other systems reviewed and are negative.  Otherwise per HPI.  Assessment & Plan: Visit Diagnoses:  1. Spondylosis without myelopathy or radiculopathy, lumbar region   2. Spondylolisthesis of lumbar region   3. Spinal stenosis of lumbar region without neurogenic claudication   4. Post laminectomy syndrome       Plan: Findings:  Chronic worsening chronic persistent severe axial low back pain which is clearly facet joint mediated low back pain.  She has pain on extension and standing.  She does get relief with rest.  We updated lumbar spine x-ray today and that is reviewed below.  We had completed radiofrequency ablation of the facet joints at L4-5 and L5-S1 in July 2017.  We did this both on the left and right side.  She got really good relief on the right side and some relief on the left but not as much.  Her body habitus does make the ablation a little bit more tricky but reviewing those images which we do have an old system appeared to be well placed.  She did get good relief overall from that procedure this was done in 2017.  I think the next step is to repeat the procedure as they usually get about a years worth of relief from that.  She should continue to follow-up with Dr. Arnoldo Morale as this is already set up and see if there is anything else he can offer her from a surgical standpoint.  I also think she should continue with the tramadol and Tylenol.  I think she can take the Tylenol 3 times a day as a scheduled medication.  I do not know any reason to not take the tramadol along with the anticoagulation other than possibly causing a change in metabolism of the Coumadin.  Obviously with continued INR measurements this could be adjusted.  I have not known this to be a very big issue and if Dr. Criss Rosales feels like it is something she has seen quite a bit that I would be happy to hear from her and we can, get a game plan together.  As for right now she does have a prescription for tramadol I have encouraged her until we can get the approval for the ablation to go ahead and take that unless she hears differently.  I will try to do some research on that as well.  Obviously we are not chronic pain management at this facility and cannot really provide chronic opioid management although on a very mild case to case basis we  would look at it for her.  I do not think is in her best interest to look at chronic progressive opioid treatment.    Meds & Orders: No orders of the defined types were placed in this encounter.   Orders Placed This Encounter  Procedures  . XR Lumbar Spine 2-3 Views    Follow-up: Return for Left radiofrequency ablation of the L4-5 and L5-S1 facet joint.   Procedures: No procedures performed  No notes on file   Clinical History: EXAM: LUMBAR SPINE - COMPLETE 4+ VIEW  COMPARISON:  Abdomen and pelvis CT dated 11/09/2015.  FINDINGS: Five non-rib-bearing lumbar vertebrae. Mild levoconvex lumbar rotary scoliosis. Mild anterior spur formation at multiple levels of the lumbar spine. Moderate anterior spur  formation, disc space narrowing and discogenic sclerosis at the T11-12 level. Interval mild anterolisthesis at the L4-5 level. Facet degenerative changes at that level. No fractures or pars defects.  IMPRESSION: Degenerative changes, as described above.   Electronically Signed   By: Claudie Revering M.D.   On: 03/01/2016 15:41  MRI LUMBAR SPINE WITHOUT CONTRAST  TECHNIQUE: Multiplanar, multisequence MR imaging of the lumbar spine was performed. No intravenous contrast was administered.  COMPARISON: Lumbar MRI 09/11/2008  FINDINGS: Normal lumbar alignment. Negative for fracture or mass. Conus medullaris normal and terminates at L2-3.  T11-12: Progression of disc degeneration which is no advanced. Diffuse disc bulging and spurring and bilateral facet degeneration. Mild to moderate spinal stenosis. Axial images not obtained through this level  T12-L1: Disc degeneration with disc bulging and spurring.  L1-2: Mild facet degeneration. Normal disc space. No significant spinal stenosis  L2-3: Mild disc and facet degeneration without spinal or foraminal stenosis  L3-4: Mild disc bulging. Mild facet degeneration without significant spinal stenosis  L4-5:  Diffuse disc bulging. Advanced facet degeneration similar to the prior study. Moderate spinal stenosis is similar to the prior study. Mild foraminal narrowing bilaterally  L5-S1: Interval laminectomy on the right for resection of a large right synovial cyst. No recurrent cyst. Bilateral facet degeneration is present with moderate foraminal narrowing bilaterally. No significant spinal stenosis. Central and left-sided disc protrusion is unchanged from the prior study.  IMPRESSION: Progressive degenerative change at T11-12 with mild to moderate spinal stenosis.  Multilevel lumbar degenerative changes as above  Moderate spinal stenosis L4-5 unchanged from the prior study  Interval laminectomy on the right at L5-S1 for resection of a large right synovial cyst. No recurrent cyst. Foraminal narrowing bilaterally due to facet overgrowth. Central left-sided disc protrusion unchanged from the prior MRI.   Electronically Signed By: Franchot Gallo M.D. On: 12/29/2014 09:08   She reports that she has never smoked. She has never used smokeless tobacco. No results for input(s): HGBA1C, LABURIC in the last 8760 hours.  Objective:  VS:  HT:    WT:   BMI:     BP:129/73  HR:77bpm  TEMP:97.9 F (36.6 C)(Oral)  RESP:97 % Physical Exam  Constitutional: She is oriented to person, place, and time. She appears well-developed and well-nourished. No distress.  Morbidly obese  HENT:  Head: Normocephalic and atraumatic.  Nose: Nose normal.  Mouth/Throat: Oropharynx is clear and moist.  Eyes: Pupils are equal, round, and reactive to light. Conjunctivae are normal.  Neck: Normal range of motion. Neck supple. No tracheal deviation present.  Cardiovascular: Regular rhythm and intact distal pulses.  Pulmonary/Chest: Effort normal. No respiratory distress.  Abdominal: Soft. She exhibits no distension. There is no guarding.  Musculoskeletal:  Patient is very slow to arise from a seated position  has concordant low back pain with extension rotation left and right more on the left.  She has no pain with hip rotation.  She has no pain over the greater trochanters.  She has good distal strength without clonus.  Neurological: She is alert and oriented to person, place, and time. She exhibits normal muscle tone. Coordination normal.  Skin: Skin is warm. No rash noted. No erythema.  Psychiatric: She has a normal mood and affect. Her behavior is normal.  Nursing note and vitals reviewed.   Ortho Exam Imaging: Xr Lumbar Spine 2-3 Views  Result Date: 06/10/2017 AP and lateral lumbar spine x-ray shows mild scoliosis centered about the L3 vertebral body.  Hip joints look well  seated without much in the way of arthritis.  Her left iliac crest a little bit higher than the right.  She has a grade 1 very small listhesis of L4 on L5 and this does not look unchanged from 2017.  She has evidence of prior right laminotomy defect at L5-S1.  She has facet arthropathy bilaterally.  This is at L4-5 and L5-S1.   Past Medical/Family/Surgical/Social History: Medications & Allergies reviewed per EMR, new medications updated. Patient Active Problem List   Diagnosis Date Noted  . 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 High risk medication use 01/06/2016  . 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 Vitamin D deficiency 01/06/2016  . Osteopenia 03/14/2012  . Diabetes mellitus type 2 in obese ([place]) 03/14/2012  . 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 Hypertension 03/14/2012   Past Medical History:  Diagnosis Date  . Clotting disorder ([place])   . Cough 02/08/2017  . Diabetic retinopathy ([place])   . 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 Full dentures   . GERD (gastroesophageal reflux disease)   . Heart murmur   . History of blood clots 1996   groin  . History of glaucoma    had laser correction  .  Hypertension    states under control with meds., has been on med. since 1996  . Insulin dependent diabetes mellitus ([place])   . Mitral valve prolapse   . Osteoarthritis    bilateral knee  . Seasonal allergies   . Sleep apnea    no CPAP use in several months, per pt.  . Trigger finger, left middle finger 01/2017   Family History  Problem Relation Age of Onset  . Cancer Mother        colon  . Cancer Cousin        lung   Past Surgical History:  Procedure Laterality Date  . BACK SURGERY     lower - removed  cysts  . CATARACT EXTRACTION W/ INTRAOCULAR LENS  IMPLANT, BILATERAL Bilateral   . GLAUCOMA SURGERY Bilateral    laser  . HEMILAMINOTOMY LUMBAR SPINE Right 01/30/2009   L5  . TOTAL KNEE ARTHROPLASTY Right 09/22/2007  . TOTAL KNEE ARTHROPLASTY Left 02/01/2007  . TRIGGER FINGER RELEASE Right 12/14/2013   Procedure: RELEASE TRIGGER FINGER/A-1 PULLEY RIGHT RING FINGER;  Surgeon: Daryll Brod, MD;  Location: [facility];  Service: Orthopedics;  Laterality: Right;  . TRIGGER FINGER RELEASE Left 02/11/2017   Procedure: RELEASE TRIGGER FINGER/A-1 PULLEY;  Surgeon: Leanora Cover, MD;  Location: [facility];  Service: Orthopedics;  Laterality: Left;   Social History   Occupational History  . Not on file  Tobacco Use  . Smoking status: Never Smoker  . Smokeless tobacco: Never Used  Substance and Sexual Activity  . Alcohol use: No  . Drug use: No  . Sexual activity: Never    Birth control/protection: Post-menopausal

## 2017-06-10 NOTE — Progress Notes (Signed)
 .  Numeric Pain Rating Scale and Functional Assessment Average Pain 8 Pain Right Now 8 My pain is aching Pain is worse with: walking and standing Pain improves with: rest   In the last MONTH (on 0-10 scale) has pain interfered with the following?  1. General activity like being  able to carry out your everyday physical activities such as walking, climbing stairs, carrying groceries, or moving a chair?  Rating(7)  2. Relation with others like being able to carry out your usual social activities and roles such as  activities at home, at work and in your community. Rating(0)  3. Enjoyment of life such that you have  been bothered by emotional problems such as feeling anxious, depressed or irritable?  Rating(0)

## 2017-06-14 DIAGNOSIS — M8580 Other specified disorders of bone density and structure, unspecified site: Secondary | ICD-10-CM | POA: Diagnosis not present

## 2017-06-14 DIAGNOSIS — Z01411 Encounter for gynecological examination (general) (routine) with abnormal findings: Secondary | ICD-10-CM | POA: Diagnosis not present

## 2017-06-15 ENCOUNTER — Encounter (INDEPENDENT_AMBULATORY_CARE_PROVIDER_SITE_OTHER): Payer: Medicare Other | Admitting: Ophthalmology

## 2017-06-15 ENCOUNTER — Telehealth (INDEPENDENT_AMBULATORY_CARE_PROVIDER_SITE_OTHER): Payer: Self-pay | Admitting: Orthopaedic Surgery

## 2017-06-15 DIAGNOSIS — I10 Essential (primary) hypertension: Secondary | ICD-10-CM | POA: Diagnosis not present

## 2017-06-15 DIAGNOSIS — E113292 Type 2 diabetes mellitus with mild nonproliferative diabetic retinopathy without macular edema, left eye: Secondary | ICD-10-CM

## 2017-06-15 DIAGNOSIS — E11311 Type 2 diabetes mellitus with unspecified diabetic retinopathy with macular edema: Secondary | ICD-10-CM

## 2017-06-15 DIAGNOSIS — H35033 Hypertensive retinopathy, bilateral: Secondary | ICD-10-CM | POA: Diagnosis not present

## 2017-06-15 DIAGNOSIS — E113211 Type 2 diabetes mellitus with mild nonproliferative diabetic retinopathy with macular edema, right eye: Secondary | ICD-10-CM

## 2017-06-15 DIAGNOSIS — H43813 Vitreous degeneration, bilateral: Secondary | ICD-10-CM | POA: Diagnosis not present

## 2017-06-15 NOTE — Telephone Encounter (Signed)
Patient would like to know if she has had a Bone Density study thru Dr. Durward Fortes ever. GYN wants to order one if she has not had one recently. Please call patient to inform.

## 2017-06-15 NOTE — Telephone Encounter (Signed)
SPOKE WITH PT TO NOTIFY LAST  3 PHASE BONE SCAN WAS ON 12/30/2016

## 2017-06-16 ENCOUNTER — Telehealth (INDEPENDENT_AMBULATORY_CARE_PROVIDER_SITE_OTHER): Payer: Self-pay | Admitting: Orthopaedic Surgery

## 2017-06-16 NOTE — Telephone Encounter (Signed)
Patient called and requested that Dr. Durward Fortes fax a copy of her bone density scan results to her OB/GYN Dr. Leo Grosser at fax # [phone]  Brien Few.

## 2017-06-17 NOTE — Telephone Encounter (Signed)
PRINTED AND FAXED OVER

## 2017-06-23 DIAGNOSIS — E0840 Diabetes mellitus due to underlying condition with diabetic neuropathy, unspecified: Secondary | ICD-10-CM | POA: Diagnosis not present

## 2017-06-23 DIAGNOSIS — I10 Essential (primary) hypertension: Secondary | ICD-10-CM | POA: Diagnosis not present

## 2017-06-23 DIAGNOSIS — M130 Polyarthritis, unspecified: Secondary | ICD-10-CM | POA: Diagnosis not present

## 2017-06-23 DIAGNOSIS — E0800 Diabetes mellitus due to underlying condition with hyperosmolarity without nonketotic hyperglycemic-hyperosmolar coma (NKHHC): Secondary | ICD-10-CM | POA: Diagnosis not present

## 2017-06-28 ENCOUNTER — Ambulatory Visit (INDEPENDENT_AMBULATORY_CARE_PROVIDER_SITE_OTHER): Payer: Medicare Other | Admitting: Physical Medicine and Rehabilitation

## 2017-06-28 ENCOUNTER — Encounter (INDEPENDENT_AMBULATORY_CARE_PROVIDER_SITE_OTHER): Payer: Self-pay | Admitting: Physical Medicine and Rehabilitation

## 2017-06-28 ENCOUNTER — Ambulatory Visit (INDEPENDENT_AMBULATORY_CARE_PROVIDER_SITE_OTHER): Payer: Medicare Other

## 2017-06-28 VITALS — BP 166/72

## 2017-06-28 DIAGNOSIS — M47816 Spondylosis without myelopathy or radiculopathy, lumbar region: Secondary | ICD-10-CM | POA: Diagnosis not present

## 2017-06-28 MED ORDER — METHYLPREDNISOLONE ACETATE 80 MG/ML IJ SUSP
80.0000 mg | Freq: Once | INTRAMUSCULAR | Status: AC
  Administered 2017-06-28: 80 mg

## 2017-06-28 NOTE — Progress Notes (Signed)
 .  Numeric Pain Rating Scale and Functional Assessment Average Pain 5   In the last MONTH (on 0-10 scale) has pain interfered with the following?  1. General activity like being  able to carry out your everyday physical activities such as walking, climbing stairs, carrying groceries, or moving a chair?  Rating(2)   +Driver, -BT, -Dye Allergies.   

## 2017-06-28 NOTE — Patient Instructions (Signed)

## 2017-07-06 ENCOUNTER — Ambulatory Visit (INDEPENDENT_AMBULATORY_CARE_PROVIDER_SITE_OTHER): Payer: Self-pay

## 2017-07-06 ENCOUNTER — Encounter (INDEPENDENT_AMBULATORY_CARE_PROVIDER_SITE_OTHER): Payer: Self-pay | Admitting: Physical Medicine and Rehabilitation

## 2017-07-06 ENCOUNTER — Ambulatory Visit (INDEPENDENT_AMBULATORY_CARE_PROVIDER_SITE_OTHER): Payer: Medicare Other | Admitting: Physical Medicine and Rehabilitation

## 2017-07-06 VITALS — BP 138/84

## 2017-07-06 DIAGNOSIS — M47816 Spondylosis without myelopathy or radiculopathy, lumbar region: Secondary | ICD-10-CM | POA: Diagnosis not present

## 2017-07-06 MED ORDER — METHYLPREDNISOLONE ACETATE 80 MG/ML IJ SUSP
40.0000 mg | Freq: Once | INTRAMUSCULAR | Status: AC
  Administered 2017-07-06: 40 mg

## 2017-07-06 NOTE — Patient Instructions (Signed)

## 2017-07-06 NOTE — Progress Notes (Signed)
 .  Numeric Pain Rating Scale and Functional Assessment Average Pain 5   In the last MONTH (on 0-10 scale) has pain interfered with the following?  1. General activity like being  able to carry out your everyday physical activities such as walking, climbing stairs, carrying groceries, or moving a chair?  Rating(0)   +Driver, -BT, -Dye Allergies.

## 2017-07-07 NOTE — Progress Notes (Signed)
Kaitlyn Jackson - 72 y.o. female MRN [id number]  Date of birth: [birth year]  Office Visit Note: Visit Date: 07/06/2017 PCP: Lucianne Lei, MD Referred by: Lucianne Lei, MD  Subjective: No chief complaint on file.  HPI: Kaitlyn Jackson is a 72 year old female that comes in today for planned right-sided radiofrequency ablation of the L4-5 and L5-S1 facet joint.  Please see our prior evaluation and management notes for further details and justification.  This is a repeat procedure.  Last procedure was over a year ago with good relief.  Since prior left-sided procedure she has noted some area of a catching sharp sensation with movement at about the lumbosacral junction.  On brief exam today she is does have tight musculature in this area but no visible swelling or induration.  We will just monitor this with her.   ROS Otherwise per HPI.  Assessment & Plan: Visit Diagnoses:  1. Spondylosis without myelopathy or radiculopathy, lumbar region     Plan: No additional findings.   Meds & Orders:  Meds ordered this encounter  Medications  . methylPREDNISolone acetate (DEPO-MEDROL) injection 40 mg    Orders Placed This Encounter  Procedures  . Radiofrequency,Lumbar  . XR C-ARM NO REPORT    Follow-up: Return if symptoms worsen or fail to improve.   Procedures: No procedures performed  Lumbar Facet Joint Nerve Denervation  [name]      Date of Birth: Mar 24, 1945 MRN: [id number] PCP: Lucianne Lei, MD      Visit Date: 07/06/2017   Universal Protocol:    Date/Time: 04/24/199:04 AM  Consent Given By: the patient  Position: PRONE  Additional Comments: Vital signs were monitored before and after the procedure. Patient was prepped and draped in the usual sterile fashion. The correct patient, procedure, and site was verified.   Injection Procedure Details:  Procedure Site One Meds Administered:  Meds ordered this encounter  Medications  . methylPREDNISolone  acetate (DEPO-MEDROL) injection 40 mg     Laterality: Right  Location/Site:  L4-L5 L5-S1  Needle size: 18 G  Needle type: Radiofrequency cannula  Needle Placement: Along juncture of superior articular process and transverse pocess  Findings:  -Comments:  Procedure Details: For each desired target nerve, the corresponding transverse process (sacral ala for the L5 dorsal rami) was identified and the fluoroscope was positioned to square off the endplates of the corresponding vertebral body to achieve a true AP midline view.  The beam was then obliqued 15 to 20 degrees and caudally tilted 15 to 20 degrees to line up a trajectory along the target nerves. The skin over the target of the junction of superior articulating process and transverse process (sacral ala for the L5 dorsal rami) was infiltrated with 6ml of 1% Lidocaine without Epinephrine.  The 18 gauge 22mm active tip outer cannula was advanced in trajectory view to the target.  This procedure was repeated for each target nerve.  Then, for all levels, the outer cannula placement was fine-tuned and the position was then confirmed with bi-planar imaging.    Test stimulation was done both at sensory and motor levels to ensure there was no radicular stimulation. The target tissues were then infiltrated with 1 ml of 1% Lidocaine without Epinephrine. Subsequently, a percutaneous neurotomy was carried out for 60 seconds at 80 degrees Celsius. The procedure was repeated with the cannula rotated 90 degrees, for duration of 60 seconds, one additional time at each level for a total of two lesions per level.  After  the completion of the two lesions, 1 ml of injectate was delivered. It was then repeated for each facet joint nerve mentioned above. Appropriate radiographs were obtained to verify the probe placement during the neurotomy.   Additional Comments:  The patient tolerated the procedure well Dressing: Band-Aid    Post-procedure  details: Patient was observed during the procedure. Post-procedure instructions were reviewed.  Patient left the clinic in stable condition.      Clinical History: EXAM: LUMBAR SPINE - COMPLETE 4+ VIEW  COMPARISON:  Abdomen and pelvis CT dated 11/09/2015.  FINDINGS: Five non-rib-bearing lumbar vertebrae. Mild levoconvex lumbar rotary scoliosis. Mild anterior spur formation at multiple levels of the lumbar spine. Moderate anterior spur formation, disc space narrowing and discogenic sclerosis at the T11-12 level. Interval mild anterolisthesis at the L4-5 level. Facet degenerative changes at that level. No fractures or pars defects.  IMPRESSION: Degenerative changes, as described above.   Electronically Signed   By: Claudie Revering M.D.   On: 03/01/2016 15:41  MRI LUMBAR SPINE WITHOUT CONTRAST  TECHNIQUE: Multiplanar, multisequence MR imaging of the lumbar spine was performed. No intravenous contrast was administered.  COMPARISON: Lumbar MRI 09/11/2008  FINDINGS: Normal lumbar alignment. Negative for fracture or mass. Conus medullaris normal and terminates at L2-3.  T11-12: Progression of disc degeneration which is no advanced. Diffuse disc bulging and spurring and bilateral facet degeneration. Mild to moderate spinal stenosis. Axial images not obtained through this level  T12-L1: Disc degeneration with disc bulging and spurring.  L1-2: Mild facet degeneration. Normal disc space. No significant spinal stenosis  L2-3: Mild disc and facet degeneration without spinal or foraminal stenosis  L3-4: Mild disc bulging. Mild facet degeneration without significant spinal stenosis  L4-5: Diffuse disc bulging. Advanced facet degeneration similar to the prior study. Moderate spinal stenosis is similar to the prior study. Mild foraminal narrowing bilaterally  L5-S1: Interval laminectomy on the right for resection of a large right synovial cyst. No recurrent  cyst. Bilateral facet degeneration is present with moderate foraminal narrowing bilaterally. No significant spinal stenosis. Central and left-sided disc protrusion is unchanged from the prior study.  IMPRESSION: Progressive degenerative change at T11-12 with mild to moderate spinal stenosis.  Multilevel lumbar degenerative changes as above  Moderate spinal stenosis L4-5 unchanged from the prior study  Interval laminectomy on the right at L5-S1 for resection of a large right synovial cyst. No recurrent cyst. Foraminal narrowing bilaterally due to facet overgrowth. Central left-sided disc protrusion unchanged from the prior MRI.   Electronically Signed By: Franchot Gallo M.D. On: 12/29/2014 09:08   She reports that she has never smoked. She has never used smokeless tobacco. No results for input(s): HGBA1C, LABURIC in the last 8760 hours.  Objective:  VS:  HT:    WT:   BMI:     BP:138/84  HR: bpm  TEMP: ( )  RESP:  Physical Exam  Ortho Exam Imaging: Xr C-arm No Report  Result Date: 07/06/2017 Please see Notes or Procedures tab for imaging impression.   Past Medical/Family/Surgical/Social History: Medications & Allergies reviewed per EMR, new medications updated. Patient Active Problem List   Diagnosis Date Noted  . 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 High risk medication use 01/06/2016  . 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 Vitamin D deficiency 01/06/2016  . Osteopenia 03/14/2012  . Diabetes mellitus type 2 in obese ([place]) 03/14/2012  . 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 Hypertension 03/14/2012  Past Medical History:  Diagnosis Date  . Clotting disorder ([place])   . Cough 02/08/2017  . Diabetic retinopathy ([place])   . 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 Full dentures   . GERD (gastroesophageal reflux disease)   . Heart murmur   . History of  blood clots 1996   groin  . History of glaucoma    had laser correction  . Hypertension    states under control with meds., has been on med. since 1996  . Insulin dependent diabetes mellitus ([place])   . Mitral valve prolapse   . Osteoarthritis    bilateral knee  . Seasonal allergies   . Sleep apnea    no CPAP use in several months, per pt.  . Trigger finger, left middle finger 01/2017   Family History  Problem Relation Age of Onset  . Cancer Mother        colon  . Cancer Cousin        lung   Past Surgical History:  Procedure Laterality Date  . BACK SURGERY     lower - removed  cysts  . CATARACT EXTRACTION W/ INTRAOCULAR LENS  IMPLANT, BILATERAL Bilateral   . GLAUCOMA SURGERY Bilateral    laser  . HEMILAMINOTOMY LUMBAR SPINE Right 01/30/2009   L5  . TOTAL KNEE ARTHROPLASTY Right 09/22/2007  . TOTAL KNEE ARTHROPLASTY Left 02/01/2007  . TRIGGER FINGER RELEASE Right 12/14/2013   Procedure: RELEASE TRIGGER FINGER/A-1 PULLEY RIGHT RING FINGER;  Surgeon: Daryll Brod, MD;  Location: [facility];  Service: Orthopedics;  Laterality: Right;  . TRIGGER FINGER RELEASE Left 02/11/2017   Procedure: RELEASE TRIGGER FINGER/A-1 PULLEY;  Surgeon: Leanora Cover, MD;  Location: [place];  Service: Orthopedics;  Laterality: Left;   Social History   Occupational History  . Not on file  Tobacco Use  . Smoking status: Never Smoker  . Smokeless tobacco: Never Used  Substance and Sexual Activity  . Alcohol use: No  . Drug use: No  . Sexual activity: Never    Birth control/protection: Post-menopausal

## 2017-07-07 NOTE — Procedures (Signed)
Lumbar Facet Joint Nerve Denervation  [name]      Date of Birth: [birth year] MRN: [id number] PCP: Lucianne Lei, MD      Visit Date: 06/28/2017   Universal Protocol:    Date/Time: 04/24/196:41 AM  Consent Given By: the patient  Position: PRONE  Additional Comments: Vital signs were monitored before and after the procedure. Patient was prepped and draped in the usual sterile fashion. The correct patient, procedure, and site was verified.   Injection Procedure Details:  Procedure Site One Meds Administered:  Meds ordered this encounter  Medications  . methylPREDNISolone acetate (DEPO-MEDROL) injection 80 mg     Laterality: Left  Location/Site: Left L3 and L4 medial branches and left L5 dorsal rami L4-L5 L5-S1  Needle size: 18 G  Needle type: Radiofrequency cannula  Needle Placement: Along juncture of superior articular process and transverse pocess  Findings:  -Comments:  Procedure Details: For each desired target nerve, the corresponding transverse process (sacral ala for the L5 dorsal rami) was identified and the fluoroscope was positioned to square off the endplates of the corresponding vertebral body to achieve a true AP midline view.  The beam was then obliqued 15 to 20 degrees and caudally tilted 15 to 20 degrees to line up a trajectory along the target nerves. The skin over the target of the junction of superior articulating process and transverse process (sacral ala for the L5 dorsal rami) was infiltrated with 66ml of 1% Lidocaine without Epinephrine.  The 18 gauge 21mm active tip outer cannula was advanced in trajectory view to the target.  This procedure was repeated for each target nerve.  Then, for all levels, the outer cannula placement was fine-tuned and the position was then confirmed with bi-planar imaging.    Test stimulation was done both at sensory and motor levels to ensure there was no radicular stimulation. The target tissues were  then infiltrated with 1 ml of 1% Lidocaine without Epinephrine. Subsequently, a percutaneous neurotomy was carried out for 60 seconds at 80 degrees Celsius. The procedure was repeated with the cannula rotated 90 degrees, for duration of 60 seconds, one additional time at each level for a total of two lesions per level.  After the completion of the two lesions, 1 ml of injectate was delivered. It was then repeated for each facet joint nerve mentioned above. Appropriate radiographs were obtained to verify the probe placement during the neurotomy.   Additional Comments:  The patient tolerated the procedure well Dressing: Band-Aid    Post-procedure details: Patient was observed during the procedure. Post-procedure instructions were reviewed.  Patient left the clinic in stable condition.

## 2017-07-07 NOTE — Procedures (Signed)
Lumbar Facet Joint Nerve Denervation  [name]      Date of Birth: 02-10-1946 MRN: [id number] PCP: Lucianne Lei, MD      Visit Date: 07/06/2017   Universal Protocol:    Date/Time: 04/24/199:04 AM  Consent Given By: the patient  Position: PRONE  Additional Comments: Vital signs were monitored before and after the procedure. Patient was prepped and draped in the usual sterile fashion. The correct patient, procedure, and site was verified.   Injection Procedure Details:  Procedure Site One Meds Administered:  Meds ordered this encounter  Medications  . methylPREDNISolone acetate (DEPO-MEDROL) injection 40 mg     Laterality: Right  Location/Site:  L4-L5 L5-S1  Needle size: 18 G  Needle type: Radiofrequency cannula  Needle Placement: Along juncture of superior articular process and transverse pocess  Findings:  -Comments:  Procedure Details: For each desired target nerve, the corresponding transverse process (sacral ala for the L5 dorsal rami) was identified and the fluoroscope was positioned to square off the endplates of the corresponding vertebral body to achieve a true AP midline view.  The beam was then obliqued 15 to 20 degrees and caudally tilted 15 to 20 degrees to line up a trajectory along the target nerves. The skin over the target of the junction of superior articulating process and transverse process (sacral ala for the L5 dorsal rami) was infiltrated with 101ml of 1% Lidocaine without Epinephrine.  The 18 gauge 96mm active tip outer cannula was advanced in trajectory view to the target.  This procedure was repeated for each target nerve.  Then, for all levels, the outer cannula placement was fine-tuned and the position was then confirmed with bi-planar imaging.    Test stimulation was done both at sensory and motor levels to ensure there was no radicular stimulation. The target tissues were then infiltrated with 1 ml of 1% Lidocaine without  Epinephrine. Subsequently, a percutaneous neurotomy was carried out for 60 seconds at 80 degrees Celsius. The procedure was repeated with the cannula rotated 90 degrees, for duration of 60 seconds, one additional time at each level for a total of two lesions per level.  After the completion of the two lesions, 1 ml of injectate was delivered. It was then repeated for each facet joint nerve mentioned above. Appropriate radiographs were obtained to verify the probe placement during the neurotomy.   Additional Comments:  The patient tolerated the procedure well Dressing: Band-Aid    Post-procedure details: Patient was observed during the procedure. Post-procedure instructions were reviewed.  Patient left the clinic in stable condition.

## 2017-07-07 NOTE — Progress Notes (Signed)
Kaitlyn Jackson - 72 y.o. female MRN [id number]  Date of birth: 04-22-1945  Office Visit Note: Visit Date: 06/28/2017 PCP: Lucianne Lei, MD Referred by: Lucianne Lei, MD  Subjective: Chief Complaint  Patient presents with  . Lower Back - Pain  . Left Leg - Pain   HPI: Kaitlyn Jackson is a 72 year old female who comes in today for planned left L4-5 L5-S1 facet ablation.  This would be ablation of the L3 and L4 medial branches and L5 dorsal rami on the left.  She is having left more than right axial low back pain worse with standing and worse with extension and facet  joint loading maneuver on exam.  This is a repeat radiofrequency ablation.  The last one was performed a couple years ago with really good relief of her axial back pain.  Her course has been complicated by facet joint cyst removal on the right by Dr. Arnoldo Morale which was giving her more radicular pain several years ago and this has resolved.  Please see our prior notes for further details and justification.   ROS Otherwise per HPI.  Assessment & Plan: Visit Diagnoses:  1. Spondylosis without myelopathy or radiculopathy, lumbar region     Plan: No additional findings.   Meds & Orders:  Meds ordered this encounter  Medications  . methylPREDNISolone acetate (DEPO-MEDROL) injection 80 mg    Orders Placed This Encounter  Procedures  . Radiofrequency,Lumbar  . XR C-ARM NO REPORT    Follow-up: Return if symptoms worsen or fail to improve.   Procedures: No procedures performed  Lumbar Facet Joint Nerve Denervation  [name]      Date of Birth: [birth year] MRN: [id number] PCP: Lucianne Lei, MD      Visit Date: 06/28/2017   Universal Protocol:    Date/Time: 04/24/196:41 AM  Consent Given By: the patient  Position: PRONE  Additional Comments: Vital signs were monitored before and after the procedure. Patient was prepped and draped in the usual sterile fashion. The correct patient, procedure,  and site was verified.   Injection Procedure Details:  Procedure Site One Meds Administered:  Meds ordered this encounter  Medications  . methylPREDNISolone acetate (DEPO-MEDROL) injection 80 mg     Laterality: Left  Location/Site: Left L3 and L4 medial branches and left L5 dorsal rami L4-L5 L5-S1  Needle size: 18 G  Needle type: Radiofrequency cannula  Needle Placement: Along juncture of superior articular process and transverse pocess  Findings:  -Comments:  Procedure Details: For each desired target nerve, the corresponding transverse process (sacral ala for the L5 dorsal rami) was identified and the fluoroscope was positioned to square off the endplates of the corresponding vertebral body to achieve a true AP midline view.  The beam was then obliqued 15 to 20 degrees and caudally tilted 15 to 20 degrees to line up a trajectory along the target nerves. The skin over the target of the junction of superior articulating process and transverse process (sacral ala for the L5 dorsal rami) was infiltrated with 71ml of 1% Lidocaine without Epinephrine.  The 18 gauge 36mm active tip outer cannula was advanced in trajectory view to the target.  This procedure was repeated for each target nerve.  Then, for all levels, the outer cannula placement was fine-tuned and the position was then confirmed with bi-planar imaging.    Test stimulation was done both at sensory and motor levels to ensure there was no radicular stimulation. The target tissues were then infiltrated with  1 ml of 1% Lidocaine without Epinephrine. Subsequently, a percutaneous neurotomy was carried out for 60 seconds at 80 degrees Celsius. The procedure was repeated with the cannula rotated 90 degrees, for duration of 60 seconds, one additional time at each level for a total of two lesions per level.  After the completion of the two lesions, 1 ml of injectate was delivered. It was then repeated for each facet joint nerve mentioned  above. Appropriate radiographs were obtained to verify the probe placement during the neurotomy.   Additional Comments:  The patient tolerated the procedure well Dressing: Band-Aid    Post-procedure details: Patient was observed during the procedure. Post-procedure instructions were reviewed.  Patient left the clinic in stable condition.      Clinical History: EXAM: LUMBAR SPINE - COMPLETE 4+ VIEW  COMPARISON:  Abdomen and pelvis CT dated 11/09/2015.  FINDINGS: Five non-rib-bearing lumbar vertebrae. Mild levoconvex lumbar rotary scoliosis. Mild anterior spur formation at multiple levels of the lumbar spine. Moderate anterior spur formation, disc space narrowing and discogenic sclerosis at the T11-12 level. Interval mild anterolisthesis at the L4-5 level. Facet degenerative changes at that level. No fractures or pars defects.  IMPRESSION: Degenerative changes, as described above.   Electronically Signed   By: Claudie Revering M.D.   On: 03/01/2016 15:41  MRI LUMBAR SPINE WITHOUT CONTRAST  TECHNIQUE: Multiplanar, multisequence MR imaging of the lumbar spine was performed. No intravenous contrast was administered.  COMPARISON: Lumbar MRI 09/11/2008  FINDINGS: Normal lumbar alignment. Negative for fracture or mass. Conus medullaris normal and terminates at L2-3.  T11-12: Progression of disc degeneration which is no advanced. Diffuse disc bulging and spurring and bilateral facet degeneration. Mild to moderate spinal stenosis. Axial images not obtained through this level  T12-L1: Disc degeneration with disc bulging and spurring.  L1-2: Mild facet degeneration. Normal disc space. No significant spinal stenosis  L2-3: Mild disc and facet degeneration without spinal or foraminal stenosis  L3-4: Mild disc bulging. Mild facet degeneration without significant spinal stenosis  L4-5: Diffuse disc bulging. Advanced facet degeneration similar to the prior  study. Moderate spinal stenosis is similar to the prior study. Mild foraminal narrowing bilaterally  L5-S1: Interval laminectomy on the right for resection of a large right synovial cyst. No recurrent cyst. Bilateral facet degeneration is present with moderate foraminal narrowing bilaterally. No significant spinal stenosis. Central and left-sided disc protrusion is unchanged from the prior study.  IMPRESSION: Progressive degenerative change at T11-12 with mild to moderate spinal stenosis.  Multilevel lumbar degenerative changes as above  Moderate spinal stenosis L4-5 unchanged from the prior study  Interval laminectomy on the right at L5-S1 for resection of a large right synovial cyst. No recurrent cyst. Foraminal narrowing bilaterally due to facet overgrowth. Central left-sided disc protrusion unchanged from the prior MRI.   Electronically Signed By: Franchot Gallo M.D. On: 12/29/2014 09:08   She reports that she has never smoked. She has never used smokeless tobacco. No results for input(s): HGBA1C, LABURIC in the last 8760 hours.  Objective:  VS:  HT:    WT:   BMI:     BP:(!) 166/72  HR: bpm  TEMP: ( )  RESP:  Physical Exam  Ortho Exam Imaging: Xr C-arm No Report  Result Date: 07/06/2017 Please see Notes or Procedures tab for imaging impression.   Past Medical/Family/Surgical/Social History: Medications & Allergies reviewed per EMR, new medications updated. Patient Active Problem List   Diagnosis Date Noted  . 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  High risk medication use 01/06/2016  . 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 Vitamin D deficiency 01/06/2016  . Osteopenia 03/14/2012  . Diabetes mellitus type 2 in obese ([place]) 03/14/2012  . 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 Hypertension 03/14/2012   Past Medical History:  Diagnosis Date  . Clotting  disorder ([place])   . Cough 02/08/2017  . Diabetic retinopathy ([place])   . 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 Full dentures   . GERD (gastroesophageal reflux disease)   . Heart murmur   . History of blood clots 1996   groin  . History of glaucoma    had laser correction  . Hypertension    states under control with meds., has been on med. since 1996  . Insulin dependent diabetes mellitus ([place])   . Mitral valve prolapse   . Osteoarthritis    bilateral knee  . Seasonal allergies   . Sleep apnea    no CPAP use in several months, per pt.  . Trigger finger, left middle finger 01/2017   Family History  Problem Relation Age of Onset  . Cancer Mother        colon  . Cancer Cousin        lung   Past Surgical History:  Procedure Laterality Date  . BACK SURGERY     lower - removed  cysts  . CATARACT EXTRACTION W/ INTRAOCULAR LENS  IMPLANT, BILATERAL Bilateral   . GLAUCOMA SURGERY Bilateral    laser  . HEMILAMINOTOMY LUMBAR SPINE Right 01/30/2009   L5  . TOTAL KNEE ARTHROPLASTY Right 09/22/2007  . TOTAL KNEE ARTHROPLASTY Left 02/01/2007  . TRIGGER FINGER RELEASE Right 12/14/2013   Procedure: RELEASE TRIGGER FINGER/A-1 PULLEY RIGHT RING FINGER;  Surgeon: Daryll Brod, MD;  Location: [facility];  Service: Orthopedics;  Laterality: Right;  . TRIGGER FINGER RELEASE Left 02/11/2017   Procedure: RELEASE TRIGGER FINGER/A-1 PULLEY;  Surgeon: Leanora Cover, MD;  Location: [place];  Service: Orthopedics;  Laterality: Left;   Social History   Occupational History  . Not on file  Tobacco Use  . Smoking status: Never Smoker  . Smokeless tobacco: Never Used  Substance and Sexual Activity  . Alcohol use: No  . Drug use: No  . Sexual activity: Never    Birth control/protection: Post-menopausal

## 2017-07-13 DIAGNOSIS — E034 Atrophy of thyroid (acquired): Secondary | ICD-10-CM | POA: Diagnosis not present

## 2017-07-13 DIAGNOSIS — H26233 Glaucomatous flecks (subcapsular), bilateral: Secondary | ICD-10-CM | POA: Diagnosis not present

## 2017-07-13 DIAGNOSIS — M130 Polyarthritis, unspecified: Secondary | ICD-10-CM | POA: Diagnosis not present

## 2017-07-13 DIAGNOSIS — I10 Essential (primary) hypertension: Secondary | ICD-10-CM | POA: Diagnosis not present

## 2017-07-27 DIAGNOSIS — R599 Enlarged lymph nodes, unspecified: Secondary | ICD-10-CM | POA: Diagnosis not present

## 2017-08-02 ENCOUNTER — Ambulatory Visit (INDEPENDENT_AMBULATORY_CARE_PROVIDER_SITE_OTHER): Payer: Medicare Other | Admitting: Podiatry

## 2017-08-02 DIAGNOSIS — M79676 Pain in unspecified toe(s): Secondary | ICD-10-CM | POA: Diagnosis not present

## 2017-08-02 DIAGNOSIS — B351 Tinea unguium: Secondary | ICD-10-CM | POA: Diagnosis not present

## 2017-08-02 DIAGNOSIS — E0843 Diabetes mellitus due to underlying condition with diabetic autonomic (poly)neuropathy: Secondary | ICD-10-CM | POA: Diagnosis not present

## 2017-08-04 NOTE — Progress Notes (Signed)
   SUBJECTIVE Patient with a history of diabetes mellitus presents to office today complaining of elongated, thickened nails that cause pain while ambulating in shoes. She is unable to trim her own nails. Patient is here for further evaluation and treatment.   Past Medical History:  Diagnosis Date  . Clotting disorder (HCC)   . Cough 02/08/2017  . Diabetic retinopathy (HCC)   . 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 Full dentures   . GERD (gastroesophageal reflux disease)   . Heart murmur   . History of blood clots 1996   groin  . History of glaucoma    had laser correction  . Hypertension    states under control with meds., has been on med. since 1996  . Insulin dependent diabetes mellitus (HCC)   . Mitral valve prolapse   . Osteoarthritis    bilateral knee  . Seasonal allergies   . Sleep apnea    no CPAP use in several months, per pt.  . Trigger finger, left middle finger 01/2017    OBJECTIVE General Patient is awake, alert, and oriented x 3 and in no acute distress. Derm Skin is dry and supple bilateral. Negative open lesions or macerations. Remaining integument unremarkable. Nails are tender, long, thickened and dystrophic with subungual debris, consistent with onychomycosis, 1-5 bilateral. No signs of infection noted. Vasc  DP and PT pedal pulses palpable bilaterally. Temperature gradient within normal limits.  Neuro Epicritic and protective threshold sensation diminished bilaterally.  Musculoskeletal Exam No symptomatic pedal deformities noted bilateral. Muscular strength within normal limits.  ASSESSMENT 1. Diabetes Mellitus w/ peripheral neuropathy 2. Onychomycosis of nail due to dermatophyte bilateral 3. Pain in foot bilateral  PLAN OF CARE 1. Patient evaluated today. 2. Instructed to maintain good pedal hygiene and foot care. Stressed importance of controlling blood sugar.  3. Mechanical debridement of nails 1-5 bilaterally performed using a nail  nipper. Filed with dremel without incident.  4. Return to clinic in 3 mos.     Brent M. Evans, DPM [facility]  Dr. Brent M. Evans, DPM    [address]                                        [place], [place] [address]                Office [phone]  Fax [phone]      

## 2017-08-13 DIAGNOSIS — Z7901 Long term (current) use of anticoagulants: Secondary | ICD-10-CM | POA: Diagnosis not present

## 2017-08-13 DIAGNOSIS — I10 Essential (primary) hypertension: Secondary | ICD-10-CM | POA: Diagnosis not present

## 2017-08-13 DIAGNOSIS — M130 Polyarthritis, unspecified: Secondary | ICD-10-CM | POA: Diagnosis not present

## 2017-08-26 ENCOUNTER — Telehealth (INDEPENDENT_AMBULATORY_CARE_PROVIDER_SITE_OTHER): Payer: Self-pay | Admitting: *Deleted

## 2017-08-26 NOTE — Telephone Encounter (Signed)
We could update MRI and or have go back to see Dr. Arnoldo Morale for Neurosurgical opinion

## 2017-08-26 NOTE — Telephone Encounter (Signed)
Pt is scheduled for 7/16 at 1015

## 2017-09-10 DIAGNOSIS — R1084 Generalized abdominal pain: Secondary | ICD-10-CM | POA: Diagnosis not present

## 2017-09-10 DIAGNOSIS — I82409 Acute embolism and thrombosis of unspecified deep veins of unspecified lower extremity: Secondary | ICD-10-CM | POA: Diagnosis not present

## 2017-09-10 DIAGNOSIS — E1100 Type 2 diabetes mellitus with hyperosmolarity without nonketotic hyperglycemic-hyperosmolar coma (NKHHC): Secondary | ICD-10-CM | POA: Diagnosis not present

## 2017-09-10 DIAGNOSIS — I10 Essential (primary) hypertension: Secondary | ICD-10-CM | POA: Diagnosis not present

## 2017-09-15 ENCOUNTER — Other Ambulatory Visit (INDEPENDENT_AMBULATORY_CARE_PROVIDER_SITE_OTHER): Payer: Self-pay | Admitting: Physical Medicine and Rehabilitation

## 2017-09-15 DIAGNOSIS — M48061 Spinal stenosis, lumbar region without neurogenic claudication: Secondary | ICD-10-CM

## 2017-09-15 DIAGNOSIS — M961 Postlaminectomy syndrome, not elsewhere classified: Secondary | ICD-10-CM

## 2017-09-15 DIAGNOSIS — M47816 Spondylosis without myelopathy or radiculopathy, lumbar region: Secondary | ICD-10-CM

## 2017-09-18 DIAGNOSIS — Z7901 Long term (current) use of anticoagulants: Secondary | ICD-10-CM | POA: Diagnosis not present

## 2017-09-20 ENCOUNTER — Ambulatory Visit
Admission: RE | Admit: 2017-09-20 | Discharge: 2017-09-20 | Disposition: A | Discharge status: Home / Self Care | Payer: Medicare Other | Source: Ambulatory Visit | Attending: Physical Medicine and Rehabilitation | Admitting: Physical Medicine and Rehabilitation

## 2017-09-20 DIAGNOSIS — M48061 Spinal stenosis, lumbar region without neurogenic claudication: Secondary | ICD-10-CM

## 2017-09-20 DIAGNOSIS — M961 Postlaminectomy syndrome, not elsewhere classified: Secondary | ICD-10-CM

## 2017-09-20 DIAGNOSIS — M47816 Spondylosis without myelopathy or radiculopathy, lumbar region: Secondary | ICD-10-CM

## 2017-09-21 ENCOUNTER — Encounter (INDEPENDENT_AMBULATORY_CARE_PROVIDER_SITE_OTHER): Payer: Medicare Other | Admitting: Ophthalmology

## 2017-09-21 DIAGNOSIS — E113293 Type 2 diabetes mellitus with mild nonproliferative diabetic retinopathy without macular edema, bilateral: Secondary | ICD-10-CM

## 2017-09-21 DIAGNOSIS — I10 Essential (primary) hypertension: Secondary | ICD-10-CM | POA: Diagnosis not present

## 2017-09-21 DIAGNOSIS — H33302 Unspecified retinal break, left eye: Secondary | ICD-10-CM

## 2017-09-21 DIAGNOSIS — E11319 Type 2 diabetes mellitus with unspecified diabetic retinopathy without macular edema: Secondary | ICD-10-CM | POA: Diagnosis not present

## 2017-09-21 DIAGNOSIS — H43813 Vitreous degeneration, bilateral: Secondary | ICD-10-CM

## 2017-09-21 DIAGNOSIS — H35033 Hypertensive retinopathy, bilateral: Secondary | ICD-10-CM | POA: Diagnosis not present

## 2017-09-27 DIAGNOSIS — M130 Polyarthritis, unspecified: Secondary | ICD-10-CM | POA: Diagnosis not present

## 2017-09-27 DIAGNOSIS — E1100 Type 2 diabetes mellitus with hyperosmolarity without nonketotic hyperglycemic-hyperosmolar coma (NKHHC): Secondary | ICD-10-CM | POA: Diagnosis not present

## 2017-09-27 DIAGNOSIS — I10 Essential (primary) hypertension: Secondary | ICD-10-CM | POA: Diagnosis not present

## 2017-09-27 DIAGNOSIS — I82409 Acute embolism and thrombosis of unspecified deep veins of unspecified lower extremity: Secondary | ICD-10-CM | POA: Diagnosis not present

## 2017-09-28 ENCOUNTER — Encounter (INDEPENDENT_AMBULATORY_CARE_PROVIDER_SITE_OTHER): Payer: Self-pay | Admitting: Physical Medicine and Rehabilitation

## 2017-09-28 ENCOUNTER — Telehealth (INDEPENDENT_AMBULATORY_CARE_PROVIDER_SITE_OTHER): Payer: Self-pay | Admitting: *Deleted

## 2017-09-28 ENCOUNTER — Ambulatory Visit (INDEPENDENT_AMBULATORY_CARE_PROVIDER_SITE_OTHER): Payer: Medicare Other | Admitting: Physical Medicine and Rehabilitation

## 2017-09-28 DIAGNOSIS — M5136 Other intervertebral disc degeneration, lumbar region: Secondary | ICD-10-CM

## 2017-09-28 DIAGNOSIS — M545 Low back pain, unspecified: Secondary | ICD-10-CM

## 2017-09-28 DIAGNOSIS — G8929 Other chronic pain: Secondary | ICD-10-CM | POA: Diagnosis not present

## 2017-09-28 DIAGNOSIS — M47816 Spondylosis without myelopathy or radiculopathy, lumbar region: Secondary | ICD-10-CM | POA: Diagnosis not present

## 2017-09-28 MED ORDER — ACETAMINOPHEN-CODEINE #3 300-30 MG PO TABS: 1.0000 | ORAL_TABLET | Freq: Three times a day (TID) | ORAL | 0 refills | Status: AC | PRN

## 2017-09-28 NOTE — Progress Notes (Signed)
 .  Numeric Pain Rating Scale and Functional Assessment Average Pain 5   In the last MONTH (on 0-10 scale) has pain interfered with the following?  1. General activity like being  able to carry out your everyday physical activities such as walking, climbing stairs, carrying groceries, or moving a chair?  Rating(6)   +Driver, -BT, -Dye Allergies.  

## 2017-10-01 NOTE — Telephone Encounter (Signed)
error 

## 2017-10-11 ENCOUNTER — Encounter (INDEPENDENT_AMBULATORY_CARE_PROVIDER_SITE_OTHER): Payer: Self-pay | Admitting: Physical Medicine and Rehabilitation

## 2017-10-11 NOTE — Progress Notes (Signed)
Kaitlyn Jackson - 72 y.o. female MRN [id number]  Date of birth: [birth year]  Office Visit Note: Visit Date: 09/28/2017 PCP: Lucianne Lei, MD Referred by: Lucianne Lei, MD  Subjective: Chief Complaint  Patient presents with  . Lower Back - Pain   HPI: Mrs. Husser is a 72 year old female with chronic worsening severe at times left-sided low back pain worse with standing and ambulating and walking.  She reports getting up from a seated position really makes the pain worse.  She reports sleeping on her right side seems to make things better.  Medications have not really helped and she is asking for pain medication.  She is someone that we have seen off and on for many years.  She initially had a facet joint cyst and had facetectomy and removal of the cyst by Dr. Newman Pies and this was on the right.  We updated her MRI after she failed repeat radiofrequency ablation procedure.  She is morbidly obese with BMI of 41.  Ablation procedure seem to be well-placed.  MRI is reviewed with the patient today and reviewed below.  She has no radicular leg pain or paresthesias.  She has had physical therapy in the past and tries to maintain activity.  Her case is complicated by discoid lupus and diabetes and heart disease.  MRI basically shows increased arthritis at the L4-5 level with gaping facet joints.  There is actually some stenosis at T11-12.   Review of Systems  Constitutional: Negative for chills, fever, malaise/fatigue and weight loss.  HENT: Negative for hearing loss and sinus pain.   Eyes: Negative for blurred vision, double vision and photophobia.  Respiratory: Negative for cough and shortness of breath.   Cardiovascular: Negative for chest pain, palpitations and leg swelling.  Gastrointestinal: Negative for abdominal pain, nausea and vomiting.  Genitourinary: Negative for flank pain.  Musculoskeletal: Positive for back pain. Negative for myalgias.  Skin: Negative for itching and rash.    Neurological: Negative for tremors, focal weakness and weakness.  Endo/Heme/Allergies: Negative.   Psychiatric/Behavioral: Negative for depression.  All other systems reviewed and are negative.  Otherwise per HPI.  Assessment & Plan: Visit Diagnoses:  1. Spondylosis without myelopathy or radiculopathy, lumbar region   2. Chronic left-sided low back pain without sciatica   3. Degenerative disc disease, lumbar     Plan: Findings:  Chronic history of axial low back pain with prior history of facet joint cyst removal on the right at L5-S1 at that time she had more radicular pain.  No radicular pain at this point.  She is failed repeat radiofrequency ablation which was performed in April.  She continues with left-sided low back pain which is still consistent with facet mediated pain.  This is evidenced by exam with pain on extension and facet joint loading and worse with standing.  I think the best approach is diagnostic note for therapeutic intra-articular facet joint block on the left at L4-5.  This may work better than medial branch denervation.  If she does not get much relief with that we will either look at epidural injection or potentially injection at the stenosis level which is at T11-12.  We are going to write a prescription for Tylenol 3.  We talked about the chronic use of opioids and how it is not very beneficial but for right now I think it is fine.  I did check the controlled substance list and she does not have any other controlled substances.  Her case is  complicated by diabetes which increases her risk of infection and may increase with steroid injection.  She also has a history of discoid lupus as well as some heart disease and hypertension.  Again these are all factors and looking at opioid prescription.    Meds & Orders:  Meds ordered this encounter  Medications  . acetaminophen-codeine (TYLENOL #3) 300-30 MG tablet    Sig: Take 1 tablet by mouth every 8 (eight) hours as needed  for up to 5 days for moderate pain.    Dispense:  20 tablet    Refill:  0   No orders of the defined types were placed in this encounter.   Follow-up: Return for Left L4-5 facet joint block.   Procedures: No procedures performed  No notes on file   Clinical History: MRI LUMBAR SPINE WITHOUT CONTRAST  TECHNIQUE: Multiplanar, multisequence MR imaging of the lumbar spine was performed. No intravenous contrast was administered.  COMPARISON:  Radiographs dated 08/11/2016 and MRI dated 07/28/2016  FINDINGS: Segmentation:  Standard.  Alignment:  3 mm spondylolisthesis of L4 on L5.  Vertebrae: Severe chronic degenerative disc disease at T11-12 with marked disc space narrowing and prominent degenerative changes of the vertebral endplates with sclerosis in the vertebral bodies. Severe bilateral facet arthritis at L4-5 and moderately severe bilateral facet arthritis at L5-S1.  Conus medullaris and cauda equina: Conus extends to the L2-3 level. Conus and cauda equina appear normal.  Paraspinal and other soft tissues: Negative.  Disc levels:  T11-12: Severe degenerative disc disease. Broad-based disc protrusion with accompanying osteophytes with hypertrophy of the ligamentum flavum which combine to create moderately severe spinal stenosis. No discrete compression of the spinal cord or myelopathy. Moderately severe left foraminal stenosis.  T12-L1: Disc space narrowing with a small broad-based disc bulge with accompanying osteophytes without neural impingement.  L1-2: Negative.  L2-3: Disc is normal. Moderate hypertrophy of the ligamentum flavum and facet joints.  L3-4: Normal disc. Moderate hypertrophy of the ligamentum flavum and facet joints.  L4-5: Progressive severe bilateral facet arthritis with increased bilateral joint effusions. Hypertrophy of the ligamentum flavum on the left. Small broad-based soft disc protrusion. The findings create mild to  moderate spinal stenosis. Slight right foraminal stenosis, unchanged.  L5-S1: Previous right laminectomy. Small chronic disc bulge to the left of midline without neural impingement. Moderate bilateral facet arthritis. No focal neural impingement.  IMPRESSION: 1. Chronic moderately severe spinal stenosis at T11-12 with moderately severe left foraminal stenosis. 2. Chronic progressive severe bilateral facet arthritis at L4-5 with mild spinal stenosis with a chronic small broad-based soft disc protrusion, unchanged. 3. Moderate bilateral facet arthritis at L5-S1 and to a lesser degree at L2-3 and L3-4.   Electronically Signed   By: Lorriane Shire M.D.   On: 09/20/2017 12:38   She reports that she has never smoked. She has never used smokeless tobacco. No results for input(s): HGBA1C, LABURIC in the last 8760 hours.  Objective:  VS:  HT:    WT:   BMI:     BP:   HR: bpm  TEMP: ( )  RESP:  Physical Exam  Constitutional: She is oriented to person, place, and time. She appears well-developed and well-nourished. No distress.  Morbidly obese  HENT:  Head: Normocephalic and atraumatic.  Nose: Nose normal.  Mouth/Throat: Oropharynx is clear and moist.  Eyes: Pupils are equal, round, and reactive to light. Conjunctivae are normal.  Neck: Normal range of motion. Neck supple.  Cardiovascular: Regular rhythm and intact distal  pulses.  Pulmonary/Chest: Effort normal. No respiratory distress.  Abdominal: She exhibits no distension. There is no guarding.  Musculoskeletal:  Patient has difficulty going from sit to full extension and she does have concordant pain with facet joint loading and extension rotation left more than right.  No pain over the greater trochanters no pain with hip rotation.  She has good distal strength.  Neurological: She is alert and oriented to person, place, and time. She exhibits normal muscle tone. Coordination normal.  Skin: Skin is warm. No rash noted. No  erythema.  Psychiatric: She has a normal mood and affect. Her behavior is normal.  Nursing note and vitals reviewed.   Ortho Exam Imaging: No results found.  Past Medical/Family/Surgical/Social History: Medications & Allergies reviewed per EMR, new medications updated. Patient Active Problem List   Diagnosis Date Noted  . 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 High risk medication use 01/06/2016  . 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 Vitamin D deficiency 01/06/2016  . Osteopenia 03/14/2012  . Diabetes mellitus type 2 in obese ([place]) 03/14/2012  . 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 Hypertension 03/14/2012   Past Medical History:  Diagnosis Date  . Clotting disorder ([place])   . Cough 02/08/2017  . Diabetic retinopathy ([place])   . 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 Full dentures   . GERD (gastroesophageal reflux disease)   . Heart murmur   . History of blood clots 1996   groin  . History of glaucoma    had laser correction  . Hypertension    states under control with meds., has been on med. since 1996  . Insulin dependent diabetes mellitus ([place])   . Mitral valve prolapse   . Osteoarthritis    bilateral knee  . Seasonal allergies   . Sleep apnea    no CPAP use in several months, per pt.  . Trigger finger, left middle finger 01/2017   Family History  Problem Relation Age of Onset  . Cancer Mother        colon  . Cancer Cousin        lung   Past Surgical History:  Procedure Laterality Date  . BACK SURGERY     lower - removed  cysts  . CATARACT EXTRACTION W/ INTRAOCULAR LENS  IMPLANT, BILATERAL Bilateral   . GLAUCOMA SURGERY Bilateral    laser  . HEMILAMINOTOMY LUMBAR SPINE Right 01/30/2009   L5  . TOTAL KNEE ARTHROPLASTY Right 09/22/2007  . TOTAL KNEE ARTHROPLASTY Left 02/01/2007  . TRIGGER FINGER RELEASE Right 12/14/2013   Procedure: RELEASE  TRIGGER FINGER/A-1 PULLEY RIGHT RING FINGER;  Surgeon: Daryll Brod, MD;  Location: [facility];  Service: Orthopedics;  Laterality: Right;  . TRIGGER FINGER RELEASE Left 02/11/2017   Procedure: RELEASE TRIGGER FINGER/A-1 PULLEY;  Surgeon: Leanora Cover, MD;  Location: [facility];  Service: Orthopedics;  Laterality: Left;   Social History   Occupational History  . Not on file  Tobacco Use  . Smoking status: Never Smoker  . Smokeless tobacco: Never Used  Substance and Sexual Activity  . Alcohol use: No  . Drug use: No  . Sexual activity: Never    Birth control/protection: Post-menopausal

## 2017-10-18 ENCOUNTER — Ambulatory Visit (INDEPENDENT_AMBULATORY_CARE_PROVIDER_SITE_OTHER): Payer: Medicare Other | Admitting: Physical Medicine and Rehabilitation

## 2017-10-18 ENCOUNTER — Ambulatory Visit (INDEPENDENT_AMBULATORY_CARE_PROVIDER_SITE_OTHER): Payer: Self-pay

## 2017-10-18 VITALS — BP 132/59 | HR 63 | Temp 97.8°F

## 2017-10-18 DIAGNOSIS — M47816 Spondylosis without myelopathy or radiculopathy, lumbar region: Secondary | ICD-10-CM

## 2017-10-18 MED ORDER — ACETAMINOPHEN-CODEINE #3 300-30 MG PO TABS: 1.0000 | ORAL_TABLET | Freq: Three times a day (TID) | ORAL | 0 refills | Status: DC | PRN

## 2017-10-18 MED ORDER — METHYLPREDNISOLONE ACETATE 80 MG/ML IJ SUSP
80.0000 mg | Freq: Once | INTRAMUSCULAR | Status: AC
  Administered 2017-10-18: 80 mg

## 2017-10-18 NOTE — Patient Instructions (Signed)

## 2017-10-18 NOTE — Progress Notes (Signed)
  Numeric Pain Rating Scale and Functional Assessment Average Pain 5   In the last MONTH (on 0-10 scale) has pain interfered with the following?  1. General activity like being  able to carry out your everyday physical activities such as walking, climbing stairs, carrying groceries, or moving a chair?  Rating(6)     

## 2017-10-28 DIAGNOSIS — I82409 Acute embolism and thrombosis of unspecified deep veins of unspecified lower extremity: Secondary | ICD-10-CM | POA: Diagnosis not present

## 2017-10-28 DIAGNOSIS — I10 Essential (primary) hypertension: Secondary | ICD-10-CM | POA: Diagnosis not present

## 2017-10-28 DIAGNOSIS — E1100 Type 2 diabetes mellitus with hyperosmolarity without nonketotic hyperglycemic-hyperosmolar coma (NKHHC): Secondary | ICD-10-CM | POA: Diagnosis not present

## 2017-10-28 DIAGNOSIS — M130 Polyarthritis, unspecified: Secondary | ICD-10-CM | POA: Diagnosis not present

## 2017-11-01 ENCOUNTER — Ambulatory Visit (INDEPENDENT_AMBULATORY_CARE_PROVIDER_SITE_OTHER): Payer: Medicare Other | Admitting: Podiatry

## 2017-11-01 DIAGNOSIS — E0843 Diabetes mellitus due to underlying condition with diabetic autonomic (poly)neuropathy: Secondary | ICD-10-CM | POA: Diagnosis not present

## 2017-11-01 DIAGNOSIS — B351 Tinea unguium: Secondary | ICD-10-CM | POA: Diagnosis not present

## 2017-11-01 DIAGNOSIS — M79676 Pain in unspecified toe(s): Secondary | ICD-10-CM | POA: Diagnosis not present

## 2017-11-03 NOTE — Progress Notes (Signed)
   SUBJECTIVE Patient with a history of diabetes mellitus presents to office today complaining of elongated, thickened nails that cause pain while ambulating in shoes. She is unable to trim her own nails. Patient is here for further evaluation and treatment.   Past Medical History:  Diagnosis Date  . Clotting disorder (HCC)   . Cough 02/08/2017  . Diabetic retinopathy (HCC)   . 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 Full dentures   . GERD (gastroesophageal reflux disease)   . Heart murmur   . History of blood clots 1996   groin  . History of glaucoma    had laser correction  . Hypertension    states under control with meds., has been on med. since 1996  . Insulin dependent diabetes mellitus (HCC)   . Mitral valve prolapse   . Osteoarthritis    bilateral knee  . Seasonal allergies   . Sleep apnea    no CPAP use in several months, per pt.  . Trigger finger, left middle finger 01/2017    OBJECTIVE General Patient is awake, alert, and oriented x 3 and in no acute distress. Derm Skin is dry and supple bilateral. Negative open lesions or macerations. Remaining integument unremarkable. Nails are tender, long, thickened and dystrophic with subungual debris, consistent with onychomycosis, 1-5 bilateral. No signs of infection noted. Vasc  DP and PT pedal pulses palpable bilaterally. Temperature gradient within normal limits.  Neuro Epicritic and protective threshold sensation diminished bilaterally.  Musculoskeletal Exam No symptomatic pedal deformities noted bilateral. Muscular strength within normal limits.  ASSESSMENT 1. Diabetes Mellitus w/ peripheral neuropathy 2. Onychomycosis of nail due to dermatophyte bilateral 3. Pain in foot bilateral  PLAN OF CARE 1. Patient evaluated today. 2. Instructed to maintain good pedal hygiene and foot care. Stressed importance of controlling blood sugar.  3. Mechanical debridement of nails 1-5 bilaterally performed using a nail  nipper. Filed with dremel without incident.  4. Return to clinic in 3 mos.     Edmar Blankenburg M. Vinod Mikesell, DPM [facility]  Dr. Nickolus Wadding M. Adaleigh Warf, DPM    [address]                                        [place], [place] [address]                Office [phone]  Fax [phone]      

## 2017-11-04 NOTE — Progress Notes (Signed)
MARKEITA ALICIA - 72 y.o. female MRN [id number]  Date of birth: 1945/06/14  Office Visit Note: Visit Date: 10/18/2017 PCP: Lucianne Lei, MD Referred by: Lucianne Lei, MD  Subjective: Chief Complaint  Patient presents with  . Lower Back - Pain   HPI: Mrs. Dolce is a 72 year old female well-known to me who comes in today for planned left L4-5 and L5-S1 intra-articular facet joint block.  Please see our prior evaluation and management note for further details and justification.  Patient is on Coumadin and continues to take Coumadin which is appropriate for the injection.  She denies any numbness tingling or paresthesia does get pain in the left low back and hip.  Does use Tylenol No. 3 appropriately and we have provided that prescription.   ROS Otherwise per HPI.  Assessment & Plan: Visit Diagnoses:  1. Spondylosis without myelopathy or radiculopathy, lumbar region     Plan: No additional findings.   Meds & Orders:  Meds ordered this encounter  Medications  . methylPREDNISolone acetate (DEPO-MEDROL) injection 80 mg  . acetaminophen-codeine (TYLENOL #3) 300-30 MG tablet    Sig: Take 1 tablet by mouth every 8 (eight) hours as needed for moderate pain.    Dispense:  30 tablet    Refill:  0    Orders Placed This Encounter  Procedures  . Facet Injection  . XR C-ARM NO REPORT    Follow-up: Return if symptoms worsen or fail to improve.   Procedures: No procedures performed  Lumbar Facet Joint Intra-Articular Injection(s) with Fluoroscopic Guidance  Patient: MARRI MCNEFF      Date of Birth: 10-24-1945 MRN: [id number] PCP: Lucianne Lei, MD      Visit Date: 10/18/2017   Universal Protocol:    Date/Time: 10/18/2017  Consent Given By: the patient  Position: PRONE   Additional Comments: Vital signs were monitored before and after the procedure. Patient was prepped and draped in the usual sterile fashion. The correct patient, procedure, and site was  verified.   Injection Procedure Details:  Procedure Site One Meds Administered:  Meds ordered this encounter  Medications  . methylPREDNISolone acetate (DEPO-MEDROL) injection 80 mg  . acetaminophen-codeine (TYLENOL #3) 300-30 MG tablet    Sig: Take 1 tablet by mouth every 8 (eight) hours as needed for moderate pain.    Dispense:  30 tablet    Refill:  0     Laterality: Left  Location/Site:  L4-L5 L5-S1  Needle size: 22 guage  Needle type: Spinal  Needle Placement: Articular  Findings:  -Comments: Excellent flow of contrast producing a partial arthrogram.  Procedure Details: The fluoroscope beam is vertically oriented in AP, and the inferior recess is visualized beneath the lower pole of the inferior apophyseal process, which represents the target point for needle insertion. When direct visualization is difficult the target point is located at the medial projection of the vertebral pedicle. The region overlying each aforementioned target is locally anesthetized with a 1 to 2 ml. volume of 1% Lidocaine without Epinephrine.   The spinal needle was inserted into each of the above mentioned facet joints using biplanar fluoroscopic guidance. A 0.25 to 0.5 ml. volume of Isovue-250 was injected and a partial facet joint arthrogram was obtained. A single spot film was obtained of the resulting arthrogram.    One to 1.25 ml of the steroid/anesthetic solution was then injected into each of the facet joints noted above.   Additional Comments:  The patient tolerated the procedure well Dressing: Band-Aid  Post-procedure details: Patient was observed during the procedure. Post-procedure instructions were reviewed.  Patient left the clinic in stable condition.    Clinical History: MRI LUMBAR SPINE WITHOUT CONTRAST  TECHNIQUE: Multiplanar, multisequence MR imaging of the lumbar spine was performed. No intravenous contrast was administered.  COMPARISON:  Radiographs dated  08/11/2016 and MRI dated 07/28/2016  FINDINGS: Segmentation:  Standard.  Alignment:  3 mm spondylolisthesis of L4 on L5.  Vertebrae: Severe chronic degenerative disc disease at T11-12 with marked disc space narrowing and prominent degenerative changes of the vertebral endplates with sclerosis in the vertebral bodies. Severe bilateral facet arthritis at L4-5 and moderately severe bilateral facet arthritis at L5-S1.  Conus medullaris and cauda equina: Conus extends to the L2-3 level. Conus and cauda equina appear normal.  Paraspinal and other soft tissues: Negative.  Disc levels:  T11-12: Severe degenerative disc disease. Broad-based disc protrusion with accompanying osteophytes with hypertrophy of the ligamentum flavum which combine to create moderately severe spinal stenosis. No discrete compression of the spinal cord or myelopathy. Moderately severe left foraminal stenosis.  T12-L1: Disc space narrowing with a small broad-based disc bulge with accompanying osteophytes without neural impingement.  L1-2: Negative.  L2-3: Disc is normal. Moderate hypertrophy of the ligamentum flavum and facet joints.  L3-4: Normal disc. Moderate hypertrophy of the ligamentum flavum and facet joints.  L4-5: Progressive severe bilateral facet arthritis with increased bilateral joint effusions. Hypertrophy of the ligamentum flavum on the left. Small broad-based soft disc protrusion. The findings create mild to moderate spinal stenosis. Slight right foraminal stenosis, unchanged.  L5-S1: Previous right laminectomy. Small chronic disc bulge to the left of midline without neural impingement. Moderate bilateral facet arthritis. No focal neural impingement.  IMPRESSION: 1. Chronic moderately severe spinal stenosis at T11-12 with moderately severe left foraminal stenosis. 2. Chronic progressive severe bilateral facet arthritis at L4-5 with mild spinal stenosis with a chronic  small broad-based soft disc protrusion, unchanged. 3. Moderate bilateral facet arthritis at L5-S1 and to a lesser degree at L2-3 and L3-4.   Electronically Signed   By: Lorriane Shire M.D.   On: 09/20/2017 12:38   She reports that she has never smoked. She has never used smokeless tobacco. No results for input(s): HGBA1C, LABURIC in the last 8760 hours.  Objective:  VS:  HT:    WT:   BMI:     BP:(!) 132/59  HR:63bpm  TEMP:97.8 F (36.6 C)( )  RESP:98 % Physical Exam  Ortho Exam Imaging: No results found.  Past Medical/Family/Surgical/Social History: Medications & Allergies reviewed per EMR, new medications updated. Patient Active Problem List   Diagnosis Date Noted  . 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 High risk medication use 01/06/2016  . 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 Vitamin D deficiency 01/06/2016  . Osteopenia 03/14/2012  . Diabetes mellitus type 2 in obese ([place]) 03/14/2012  . 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 Hypertension 03/14/2012   Past Medical History:  Diagnosis Date  . Clotting disorder ([place])   . Cough 02/08/2017  . Diabetic retinopathy ([place])   . 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 Full dentures   . GERD (gastroesophageal reflux disease)   . Heart murmur   . History of blood clots 1996   groin  . History of glaucoma    had laser correction  . Hypertension    states under control with meds., has been on med. since  1996  . Insulin dependent diabetes mellitus ([place])   . Mitral valve prolapse   . Osteoarthritis    bilateral knee  . Seasonal allergies   . Sleep apnea    no CPAP use in several months, per pt.  . Trigger finger, left middle finger 01/2017   Family History  Problem Relation Age of Onset  . Cancer Mother        colon  . Cancer Cousin        lung   Past Surgical History:  Procedure Laterality Date  .  BACK SURGERY     lower - removed  cysts  . CATARACT EXTRACTION W/ INTRAOCULAR LENS  IMPLANT, BILATERAL Bilateral   . GLAUCOMA SURGERY Bilateral    laser  . HEMILAMINOTOMY LUMBAR SPINE Right 01/30/2009   L5  . TOTAL KNEE ARTHROPLASTY Right 09/22/2007  . TOTAL KNEE ARTHROPLASTY Left 02/01/2007  . TRIGGER FINGER RELEASE Right 12/14/2013   Procedure: RELEASE TRIGGER FINGER/A-1 PULLEY RIGHT RING FINGER;  Surgeon: Daryll Brod, MD;  Location: [facility];  Service: Orthopedics;  Laterality: Right;  . TRIGGER FINGER RELEASE Left 02/11/2017   Procedure: RELEASE TRIGGER FINGER/A-1 PULLEY;  Surgeon: Leanora Cover, MD;  Location: [facility];  Service: Orthopedics;  Laterality: Left;   Social History   Occupational History  . Not on file  Tobacco Use  . Smoking status: Never Smoker  . Smokeless tobacco: Never Used  Substance and Sexual Activity  . Alcohol use: No  . Drug use: No  . Sexual activity: Never    Birth control/protection: Post-menopausal

## 2017-11-04 NOTE — Procedures (Signed)
Lumbar Facet Joint Intra-Articular Injection(s) with Fluoroscopic Guidance  [name]      Date of Birth: [birth year] MRN: [id number] PCP: Lucianne Lei, MD      Visit Date: 10/18/2017   Universal Protocol:    Date/Time: 10/18/2017  Consent Given By: the patient  Position: PRONE   Additional Comments: Vital signs were monitored before and after the procedure. Patient was prepped and draped in the usual sterile fashion. The correct patient, procedure, and site was verified.   Injection Procedure Details:  Procedure Site One Meds Administered:  Meds ordered this encounter  Medications  . methylPREDNISolone acetate (DEPO-MEDROL) injection 80 mg  . acetaminophen-codeine (TYLENOL #3) 300-30 MG tablet    Sig: Take 1 tablet by mouth every 8 (eight) hours as needed for moderate pain.    Dispense:  30 tablet    Refill:  0     Laterality: Left  Location/Site:  L4-L5 L5-S1  Needle size: 22 guage  Needle type: Spinal  Needle Placement: Articular  Findings:  -Comments: Excellent flow of contrast producing a partial arthrogram.  Procedure Details: The fluoroscope beam is vertically oriented in AP, and the inferior recess is visualized beneath the lower pole of the inferior apophyseal process, which represents the target point for needle insertion. When direct visualization is difficult the target point is located at the medial projection of the vertebral pedicle. The region overlying each aforementioned target is locally anesthetized with a 1 to 2 ml. volume of 1% Lidocaine without Epinephrine.   The spinal needle was inserted into each of the above mentioned facet joints using biplanar fluoroscopic guidance. A 0.25 to 0.5 ml. volume of Isovue-250 was injected and a partial facet joint arthrogram was obtained. A single spot film was obtained of the resulting arthrogram.    One to 1.25 ml of the steroid/anesthetic solution was then injected into each of the facet  joints noted above.   Additional Comments:  The patient tolerated the procedure well Dressing: Band-Aid    Post-procedure details: Patient was observed during the procedure. Post-procedure instructions were reviewed.  Patient left the clinic in stable condition.

## 2017-11-05 ENCOUNTER — Telehealth (INDEPENDENT_AMBULATORY_CARE_PROVIDER_SITE_OTHER): Payer: Self-pay

## 2017-11-05 NOTE — Telephone Encounter (Signed)
Patient called and left voicemail in [place]. She states she had an injection with Dr Ernestina Patches 2 weeks ago and Dr Ernestina Patches told her if she still has pain to call him back. Would like a return call. Thank you.  CB [phone]

## 2017-11-08 NOTE — Telephone Encounter (Signed)
Have done all I know except spine/neuro surgery consultation. Could look at medication changes, regroup with PT.

## 2017-11-08 NOTE — Telephone Encounter (Signed)
Called pt and she would like to call back once she gets home to look at her calendar.

## 2017-11-08 NOTE — Telephone Encounter (Signed)
Patient states that she only got 3%-4% pain relief from the left L4-5 and L5-S1 facet injections she had on 10/18/17. Please advise.

## 2017-11-10 ENCOUNTER — Encounter: Payer: Self-pay | Admitting: Allergy

## 2017-11-10 ENCOUNTER — Ambulatory Visit (INDEPENDENT_AMBULATORY_CARE_PROVIDER_SITE_OTHER): Payer: Medicare Other | Admitting: Allergy

## 2017-11-10 VITALS — BP 136/60 | HR 68 | Temp 97.6°F | Resp 16 | Ht 62.0 in | Wt 237.8 lb

## 2017-11-10 DIAGNOSIS — Z87898 Personal history of other specified conditions: Secondary | ICD-10-CM | POA: Diagnosis not present

## 2017-11-10 DIAGNOSIS — J3089 Other allergic rhinitis: Secondary | ICD-10-CM

## 2017-11-10 MED ORDER — AZELASTINE HCL 0.1 % NA SOLN: 2.0000 | Freq: Two times a day (BID) | NASAL | 5 refills | Status: DC

## 2017-11-10 NOTE — Progress Notes (Signed)
New Patient Note  RE: Kaitlyn Jackson MRN: [id number] DOB: 1946-01-11 Date of Office Visit: 11/10/2017  Referring provider: Lucianne Lei, MD Primary care provider: Lucianne Lei, MD  Chief Complaint: allergies  History of present illness: Kaitlyn Jackson is a 72 y.o. female presenting today for consultation for allergies.   She reports she has constant nasal drainage (anterior drip more than posterior drip) and sneezing episodes.  Sneezing she reports is worse in the summer but runny nose is year-round.  Symptoms ongoing now for years.   She has nasal atrovent that she uses as needed when nose is really runny which does help temporarily.   She has used flonase that did not help.  Has not used a nasal antihistamine.  She also has tried use of fexofenadine that does help with sneezing and she takes as needed.   She does feel that fexofenadine works a bit better than branded Allegra.  She also had tried claritin and zyrtec and does not feel she could notice a difference between these antihistamines.  She has never had any allergy testing before. She takes montelukast now for years that she was prescribed for wheezing.  She states sometimes when she would lay down she could hear herself wheezing.  Montelukast has stopped the wheezing.  She does not have an albuterol inhaler at this time.  She denies any cough, SOB or chest tightness.  She has no diagnosis of asthma.  No history of food allergy.  She reports she has dry skin but no history of eczema.     Review of systems: Review of Systems  Constitutional: Negative for chills, fever and malaise/fatigue.  HENT: Positive for congestion. Negative for ear discharge, ear pain, nosebleeds, sinus pain and sore throat.   Eyes: Negative for pain, discharge and redness.  Respiratory: Negative for cough, sputum production, shortness of breath and wheezing.   Cardiovascular: Negative for chest pain.  Gastrointestinal: Negative for abdominal pain,  constipation, diarrhea, heartburn, nausea and vomiting.  Musculoskeletal: Positive for joint pain.  Skin: Negative for itching and rash.  Neurological: Negative for headaches.    All other systems negative unless noted above in HPI  Past medical history: Past Medical History:  Diagnosis Date  . Clotting disorder ([place])   . Cough 02/08/2017  . Diabetic retinopathy ([place])   . 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 Full dentures   . GERD (gastroesophageal reflux disease)   . Heart murmur   . History of blood clots 1996   groin  . History of glaucoma    had laser correction  . Hypertension    states under control with meds., has been on med. since 1996  . Insulin dependent diabetes mellitus ([place])   . Mitral valve prolapse   . Osteoarthritis    bilateral knee  . Seasonal allergies   . Sleep apnea    no CPAP use in several months, per pt.  . Trigger finger, left middle finger 01/2017    Past surgical history: Past Surgical History:  Procedure Laterality Date  . BACK SURGERY     lower - removed  cysts  . CATARACT EXTRACTION W/ INTRAOCULAR LENS  IMPLANT, BILATERAL Bilateral   . GLAUCOMA SURGERY Bilateral    laser  . HEMILAMINOTOMY LUMBAR SPINE Right 01/30/2009   L5  . TOTAL KNEE ARTHROPLASTY Right 09/22/2007  . TOTAL KNEE ARTHROPLASTY Left 02/01/2007  . TRIGGER FINGER RELEASE Right 12/14/2013   Procedure: RELEASE TRIGGER FINGER/A-1 PULLEY  RIGHT RING FINGER;  Surgeon: Daryll Brod, MD;  Location: [facility];  Service: Orthopedics;  Laterality: Right;  . TRIGGER FINGER RELEASE Left 02/11/2017   Procedure: RELEASE TRIGGER FINGER/A-1 PULLEY;  Surgeon: Leanora Cover, MD;  Location: [facility];  Service: Orthopedics;  Laterality: Left;    Family history:  Family History  Problem Relation Age of Onset  . Cancer Mother        colon  . Cancer Cousin        lung    Social history: She lives in an apartment with her husband that has  carpeting with electric heating and central cooling.  There are no pets in the home but there are dogs and cats outside the apartment.  She did have a roaches in her apartment but this has been remedied in the apartment complex has been treated and she is no longer has any issues.  She denies any concern of water damage or mildew.  She is retired.  She denies a smoking history.  Medication List: Allergies as of 11/10/2017   No Known Allergies     Medication List        Accurate as of 11/10/17 11:40 AM. Always use your most recent med list.          ACCU-CHEK AVIVA PLUS test strip Generic drug:  glucose blood CHECK BLOOD GLUCOSE TWICE A DAY   acetaminophen-codeine 300-30 MG tablet Commonly known as:  TYLENOL #3 Take 1 tablet by mouth every 8 (eight) hours as needed for moderate pain.   atorvastatin 20 MG tablet Commonly known as:  LIPITOR Take 10 mg by mouth at bedtime.   benazepril 40 MG tablet Commonly known as:  LOTENSIN Take 40 mg by mouth daily.   cholecalciferol 1000 units tablet Commonly known as:  VITAMIN D Take 1,000 Units by mouth daily.   [employer] LANCING DEVICE Misc Accu-Chek FastClix Lancing Device   cyclobenzaprine 10 MG tablet Commonly known as:  FLEXERIL Take 1 tablet (10 mg total) by mouth 2 (two) times daily as needed for muscle spasms.   desonide 0.05 % lotion Commonly known as:  DESOWEN Apply 1 application topically 2 (two) times daily.   esomeprazole 40 MG capsule Commonly known as:  NEXIUM Take 40 mg by mouth daily before breakfast.   fexofenadine 180 MG tablet Commonly known as:  ALLEGRA Take 180 mg by mouth daily.   FIFTY50 PEN NEEDLES 31G X 8 MM Misc Generic drug:  Insulin Pen Needle BD Ultra-Fine Short Pen Needle 31 gauge x 5/16"   NOVOFINE 30G X 8 MM Misc Generic drug:  Insulin Pen Needle NovoFine 30 30 gauge x 1/3" needle   NOVOFINE PLUS 32G X 4 MM Misc Generic drug:  Insulin Pen Needle NovoFine Plus 32 gauge x 1/6" needle  USE 2  TIMES DAILY   fluticasone 50 MCG/ACT nasal spray Commonly known as:  FLONASE Place 1 spray into both nostrils daily.   folic acid 1 MG tablet Commonly known as:  FOLVITE Take 1 mg by mouth daily.   furosemide 40 MG tablet Commonly known as:  LASIX Take 40 mg by mouth daily.   GLUCOCOM BLOOD GLUCOSE MONITOR Devi Accu-Chek Aviva Plus Meter   HUMALOG MIX 75/25 KWIKPEN (75-25) 100 UNIT/ML Kwikpen Generic drug:  Insulin Lispro Prot & Lispro INJECT 60 UNITS SQ EVERY MORNING   insulin NPH-regular Human (70-30) 100 UNIT/ML injection Commonly known as:  NOVOLIN 70/30 Inject 10 Units into the skin 3 (three) times  daily. Takes 30 units in the morning and 10 units at night   metFORMIN 1000 MG tablet Commonly known as:  GLUCOPHAGE Take 1,000 mg by mouth at bedtime.   montelukast 10 MG tablet Commonly known as:  SINGULAIR Take 10 mg by mouth at bedtime.   multivitamin tablet Take 1 tablet by mouth daily.   pregabalin 100 MG capsule Commonly known as:  LYRICA Take 100 mg by mouth 2 (two) times daily.   sitaGLIPtin-metformin 50-1000 MG tablet Commonly known as:  JANUMET Take 1 tablet by mouth daily.   SODIUM SULFACETAMIDE EX Apply 1 application topically every 6 (six) hours as needed (for itching).   verapamil 180 MG (CO) 24 hr tablet Commonly known as:  COVERA HS Take 180 mg by mouth 2 (two) times daily.   warfarin 7.5 MG tablet Commonly known as:  COUMADIN TAKE 1 TABLET EVERY OTHER DAY ALTERNATING WITH 10 MG   warfarin 10 MG tablet Commonly known as:  COUMADIN Take 10 mg by mouth every other day.       Known medication allergies: No Known Allergies   Physical examination: Blood pressure 136/60, pulse 68, temperature 97.6 F (36.4 C), temperature source Oral, resp. rate 16, height 5\' 2"  (1.575 m), weight 237 lb 12.8 oz (107.9 kg), SpO2 98 %.  General: Alert, interactive, in no acute distress. HEENT: PERRLA, TMs pearly gray, turbinates minimally edematous with  clear discharge, post-pharynx non erythematous. Neck: Supple without lymphadenopathy. Lungs: Clear to auscultation without wheezing, rhonchi or rales. {no increased work of breathing. CV: Normal S1, S2 without murmurs. Abdomen: Nondistended, nontender. Skin: Warm and dry, without lesions or rashes. Extremities:  No clubbing, cyanosis or edema. Neuro:   Grossly intact.  Diagnositics/Labs: Allergy testing: Environmental allergy skin prick testing is negative with positive histamine control. Intradermal testing is positive to cockroach Allergy testing results were read and interpreted by provider, documented by clinical staff.   Assessment and plan: Patient Instructions  Rhinitis, allergic   - environmental allergy skin testing today is positive to cockroach.  Allergen avoidance measures discussed and handouts provided for cockroach   - for runny nose recommend use of nasal antihistamine, Astelin 1-2 sprays each nostril twice a day   - may continue as needed use of nasal ipratropium up to 3-4 times a day for runny nose   - continue fexofenadine 180mg  daily   - continue montelukast 10mg  daily - take at bedtime  History of wheezing   - improved with use of montelukast, continue  Follow-up 6 months or sooner if needed   I appreciate the opportunity to take part in Verena's care. Please do not hesitate to contact me with questions.  Sincerely,   Prudy Feeler, MD Allergy/Immunology [facility]

## 2017-11-10 NOTE — Patient Instructions (Addendum)
Rhinitis   - environmental allergy skin testing today is positive to cockroach.  Allergen avoidance measures discussed and handouts provided for cockroach   - for runny nose recommend use of nasal antihistamine, Astelin 1-2 sprays each nostril twice a day   - may continue as needed use of nasal ipratropium up to 3-4 times a day for runny nose   - continue fexofenadine 180mg  daily   - continue montelukast 10mg  daily - take at bedtime  History of wheezing   - improved with use of montelukast, continue  Follow-up 6 months or sooner if needed

## 2017-11-10 NOTE — Telephone Encounter (Signed)
Called pt on home (mailbox is full), called pt on mobile and she states that she is in an appt with another dr and she will give [place] a call back.

## 2017-11-10 NOTE — Telephone Encounter (Signed)
Pt is scheduled for ov 11/24/17

## 2017-11-24 ENCOUNTER — Telehealth (INDEPENDENT_AMBULATORY_CARE_PROVIDER_SITE_OTHER): Payer: Self-pay | Admitting: Physical Medicine and Rehabilitation

## 2017-11-24 ENCOUNTER — Encounter (INDEPENDENT_AMBULATORY_CARE_PROVIDER_SITE_OTHER): Payer: Self-pay | Admitting: Physical Medicine and Rehabilitation

## 2017-11-24 ENCOUNTER — Ambulatory Visit (INDEPENDENT_AMBULATORY_CARE_PROVIDER_SITE_OTHER): Payer: Medicare Other | Admitting: Physical Medicine and Rehabilitation

## 2017-11-24 VITALS — BP 126/62 | HR 70 | Ht 64.0 in | Wt 233.0 lb

## 2017-11-24 DIAGNOSIS — G629 Polyneuropathy, unspecified: Secondary | ICD-10-CM

## 2017-11-24 DIAGNOSIS — F1190 Opioid use, unspecified, uncomplicated: Secondary | ICD-10-CM

## 2017-11-24 DIAGNOSIS — M545 Low back pain, unspecified: Secondary | ICD-10-CM

## 2017-11-24 DIAGNOSIS — R202 Paresthesia of skin: Secondary | ICD-10-CM | POA: Diagnosis not present

## 2017-11-24 DIAGNOSIS — M79641 Pain in right hand: Secondary | ICD-10-CM

## 2017-11-24 DIAGNOSIS — M4805 Spinal stenosis, thoracolumbar region: Secondary | ICD-10-CM | POA: Diagnosis not present

## 2017-11-24 DIAGNOSIS — M79642 Pain in left hand: Secondary | ICD-10-CM

## 2017-11-24 DIAGNOSIS — M47816 Spondylosis without myelopathy or radiculopathy, lumbar region: Secondary | ICD-10-CM | POA: Diagnosis not present

## 2017-11-24 DIAGNOSIS — G8929 Other chronic pain: Secondary | ICD-10-CM

## 2017-11-24 DIAGNOSIS — Z7901 Long term (current) use of anticoagulants: Secondary | ICD-10-CM

## 2017-11-24 NOTE — Progress Notes (Signed)
 .  Numeric Pain Rating Scale and Functional Assessment Average Pain 9 Pain Right Now 4 My pain is intermittent, dull and aching Pain is worse with: walking, standing and some activites Pain improves with: rest   In the last MONTH (on 0-10 scale) has pain interfered with the following?  1. General activity like being  able to carry out your everyday physical activities such as walking, climbing stairs, carrying groceries, or moving a chair?  Rating(6)  2. Relation with others like being able to carry out your usual social activities and roles such as  activities at home, at work and in your community. Rating(3)  3. Enjoyment of life such that you have  been bothered by emotional problems such as feeling anxious, depressed or irritable?  Rating(3)

## 2017-11-24 NOTE — Progress Notes (Signed)
Kaitlyn Jackson - 72 y.o. female MRN [id number]  Date of birth: 1945-12-22  Office Visit Note: Visit Date: 11/24/2017 PCP: Lucianne Lei, MD Referred by: Lucianne Lei, MD  Subjective: Chief Complaint  Patient presents with  . Lower Back - Pain  . Right Hand - Pain, Numbness  . Left Hand - Pain, Numbness, Weakness   HPI: Kaitlyn Jackson is a very pleasant 72 year old female who I have seen over many years off and on.  We originally saw her she had a pretty significant facet joint cyst on the right at L5-S1 that ultimately did respond to injection but she did have facet joint resection by Dr. Arnoldo Morale.  She went on for several years with pretty good relief.  This was more radicular pain at the time.  She now has chronic worsening left-sided more than right axial low back pain.  We completed radiofrequency ablation of the lower facet joints at L4-5 and L5-S1 based on prior imaging but she did not get much relief with this procedure.  She went on to have MRI of the lumbar spine and this is reviewed last time we saw her and does show gaping facet joints at L4-5 but without listhesis at least in the supine position.  I have not looked at flexion-extension films.  She has significant arthritis at that level.  She has some mild to moderate canal stenosis but no frank nerve compression or foraminal stenosis.  Interestingly she has disc protrusion and canal stenosis relief from stenosis as well as a disc protrusion at T11-12.  She denies any claudication type symptoms or leg pain.  She does rate her pain is a 9 out of 10 when she is standing.  Going from sit to stand is problematic.  Changing positions is problematic.  She cannot stand for very long at times.  She reports pain currently is a 4 out of 10 just sitting.  She denies any shooting radicular pain.  Her case is complicated by the concomitant use of anticoagulants as well as insulin-dependent diabetes.  Likely the last injection we performed which was  intra-articular facet joint block did not raise her blood sugars.  Only helped about 30 to 40%.  It was seemingly fairly short-lived.  She denies any focal weakness or other red flag complaints.  She also complains today of bilateral hand numbness and tingling in the radial digits.  She reports some weakness in the right hand.  She used to do Holter and nails make up.  She work with her hands for quite a while and she has had on and off again numbness and tingling.  This is worse at night.  It is better during the day but can be worse with driving or certain positions.  She was told back in [place] when she lived there many years ago that she had polyneuropathy in the hands.  She does carry a diagnosis of diabetic neuropathy in the feet.  She is on Lyrica.  She has not had electrodiagnostic studies of the upper limbs.  She denies radicular complaints down the arms.  She does take some Tylenol 3 on occasion which we have provided that does seem to provide some relief.  She does take Tylenol.  She is on anticoagulation and cannot take anti-inflammatories.   Review of Systems  Constitutional: Positive for malaise/fatigue. Negative for chills, fever and weight loss.  HENT: Negative for hearing loss and sinus pain.   Eyes: Negative for blurred vision, double vision and photophobia.  Respiratory: Negative for cough and shortness of breath.   Cardiovascular: Negative for chest pain, palpitations and leg swelling.  Gastrointestinal: Negative for abdominal pain, nausea and vomiting.  Genitourinary: Negative for flank pain.  Musculoskeletal: Positive for back pain and joint pain. Negative for myalgias.  Skin: Negative for itching and rash.  Neurological: Positive for tingling, sensory change and focal weakness. Negative for tremors and weakness.  Endo/Heme/Allergies: Negative.   Psychiatric/Behavioral: Negative for depression.  All other systems reviewed and are negative.  Otherwise per HPI.  Assessment  & Plan: Visit Diagnoses:  1. Spondylosis without myelopathy or radiculopathy, lumbar region   2. Spinal stenosis of thoracolumbar region   3. Chronic left-sided low back pain without sciatica   4. Paresthesia of skin   5. Bilateral hand pain   6. Chronic, continuous use of opioids   7. Anticoagulant long-term use   8. Neuropathy     Plan: Findings:  1.  Chronic low back pain left-sided more than right with history of significant facet joint arthropathy and spondylosis of the lumbar spine.  Facet joint blocks intra-articularly were not very successful although they were more successful than the radiofrequency ablation.  We have completed ablation procedure in the past that was helpful so that was a repeat.  Updated MRI shows gaping facet joints which will probably lead to instability at this level but there is no listhesis currently on supine MRI.  I think at this point the symptoms may not be related to the arthritis although is pretty significant.  She is obese and very short wasted and where she points could be related to the T11-12 stenosis and disc degenerative change.  There is a fair amount of stenosis at this level.  I think given the fact that it has not affected her diabetes control to much one more injection from an interlaminar epidural approach right below the T11-12 level would go along way into being diagnostic and hopefully therapeutic.  She would do well probably with decompression at this level if it was found to be the symptomatic level although she does have some risk obviously.  We could refer her back to Dr. Arnoldo Morale at that point.  We also discussed weight loss and exercise which she continues to try to do.  We discussed medication management at length.  She is already on Lyrica at a pretty decent dose 100 mg twice a day.  We also given her a mild amount of Tylenol with codeine that she does tolerate and she does use this sparingly.  She does take Tylenol in between.  We will see her  back for the injection.  She has had physical therapy in the past but not recently.  2.  Bilateral hand pain and numbness.  Clinically her symptoms are more consistent with carpal tunnel syndrome than polyneuropathy.  She does carry a diagnosis of diabetic polyneuropathy.  Examination shows significant atrophy of the right APB but not the FDI which would be consistent more with a carpal tunnel syndrome than a polyneuropathy.  We discussed this at length and we are going to do electrodiagnostic studies of both upper limbs.  Depending on those results would refer to orthopedics for possible decompression.  She will continue current medications including the Lyrica.    Meds & Orders: No orders of the defined types were placed in this encounter.  No orders of the defined types were placed in this encounter.   Follow-up: Return for T12-L1 interlaminar epidural steroid injection and electrodiagnostic study  of the upper limbs..   Procedures: No procedures performed  No notes on file   Clinical History: MRI LUMBAR SPINE WITHOUT CONTRAST  TECHNIQUE: Multiplanar, multisequence MR imaging of the lumbar spine was performed. No intravenous contrast was administered.  COMPARISON:  Radiographs dated 08/11/2016 and MRI dated 07/28/2016  FINDINGS: Segmentation:  Standard.  Alignment:  3 mm spondylolisthesis of L4 on L5.  Vertebrae: Severe chronic degenerative disc disease at T11-12 with marked disc space narrowing and prominent degenerative changes of the vertebral endplates with sclerosis in the vertebral bodies. Severe bilateral facet arthritis at L4-5 and moderately severe bilateral facet arthritis at L5-S1.  Conus medullaris and cauda equina: Conus extends to the L2-3 level. Conus and cauda equina appear normal.  Paraspinal and other soft tissues: Negative.  Disc levels:  T11-12: Severe degenerative disc disease. Broad-based disc protrusion with accompanying osteophytes with  hypertrophy of the ligamentum flavum which combine to create moderately severe spinal stenosis. No discrete compression of the spinal cord or myelopathy. Moderately severe left foraminal stenosis.  T12-L1: Disc space narrowing with a small broad-based disc bulge with accompanying osteophytes without neural impingement.  L1-2: Negative.  L2-3: Disc is normal. Moderate hypertrophy of the ligamentum flavum and facet joints.  L3-4: Normal disc. Moderate hypertrophy of the ligamentum flavum and facet joints.  L4-5: Progressive severe bilateral facet arthritis with increased bilateral joint effusions. Hypertrophy of the ligamentum flavum on the left. Small broad-based soft disc protrusion. The findings create mild to moderate spinal stenosis. Slight right foraminal stenosis, unchanged.  L5-S1: Previous right laminectomy. Small chronic disc bulge to the left of midline without neural impingement. Moderate bilateral facet arthritis. No focal neural impingement.  IMPRESSION: 1. Chronic moderately severe spinal stenosis at T11-12 with moderately severe left foraminal stenosis. 2. Chronic progressive severe bilateral facet arthritis at L4-5 with mild spinal stenosis with a chronic small broad-based soft disc protrusion, unchanged. 3. Moderate bilateral facet arthritis at L5-S1 and to a lesser degree at L2-3 and L3-4.   Electronically Signed   By: Lorriane Shire M.D.   On: 09/20/2017 12:38   She reports that she has never smoked. She has never used smokeless tobacco. No results for input(s): HGBA1C, LABURIC in the last 8760 hours.  Objective:  VS:  HT:5\' 4"  (162.6 cm)   WT:233 lb (105.7 kg)  BMI:39.97    BP:126/62  HR:70bpm  TEMP: ( )  RESP:  Physical Exam  Constitutional: She is oriented to person, place, and time. She appears well-developed and well-nourished. No distress.  Morbidly obese  HENT:  Head: Normocephalic and atraumatic.  Nose: Nose normal.    Mouth/Throat: Oropharynx is clear and moist.  Eyes: Pupils are equal, round, and reactive to light. Conjunctivae are normal.  Neck: Normal range of motion. Neck supple. No tracheal deviation present.  Cardiovascular: Regular rhythm and intact distal pulses.  Pulmonary/Chest: Effort normal. No respiratory distress.  Abdominal: She exhibits no distension. There is no guarding.  Musculoskeletal:  Patient has pain going from sit to stand with extension.  No real paraspinal tenderness.  No pain with hip rotation.  No pain over the greater trochanter.  She has good distal strength with a negative slump test.  No clonus bilaterally.  Examination of the bilateral hands shows significant atrophy of the right APB but not the FDI bilaterally or left APB.  She has good strength in the hands bilaterally with some osteoarthritic changes.  She has a positive Tinel's over the right wrist and somewhat positive over the  left.  She has a positive Phalen's on the right as well.  She does have decreased sensation and motor median nerve distribution on the right.  Lymphadenopathy:    She has no cervical adenopathy.  Neurological: She is alert and oriented to person, place, and time. She exhibits normal muscle tone. Coordination normal.  Skin: Skin is warm. No rash noted. No erythema.  Psychiatric: She has a normal mood and affect. Her behavior is normal.  Nursing note and vitals reviewed.   Ortho Exam Imaging: No results found.  Past Medical/Family/Surgical/Social History: Medications & Allergies reviewed per EMR, new medications updated. Patient Active Problem List   Diagnosis Date Noted  . 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 High risk medication use 01/06/2016  . 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 Vitamin D deficiency 01/06/2016  . Osteopenia 03/14/2012  . Diabetes mellitus type 2 in obese ([place]) 03/14/2012  . ASCVD  (arteriosclerotic cardiovascular disease) 03/14/2012  . Hypertension 03/14/2012   Past Medical History:  Diagnosis Date  . Clotting disorder ([place])   . Cough 02/08/2017  . Diabetic retinopathy ([place])   . 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 Full dentures   . GERD (gastroesophageal reflux disease)   . Heart murmur   . History of blood clots 1996   groin  . History of glaucoma    had laser correction  . Hypertension    states under control with meds., has been on med. since 1996  . Insulin dependent diabetes mellitus ([place])   . Mitral valve prolapse   . Osteoarthritis    bilateral knee  . Seasonal allergies   . Sleep apnea    no CPAP use in several months, per pt.  . Trigger finger, left middle finger 01/2017   Family History  Problem Relation Age of Onset  . Cancer Mother        colon  . Cancer Cousin        lung   Past Surgical History:  Procedure Laterality Date  . BACK SURGERY     lower - removed  cysts  . CATARACT EXTRACTION W/ INTRAOCULAR LENS  IMPLANT, BILATERAL Bilateral   . GLAUCOMA SURGERY Bilateral    laser  . HEMILAMINOTOMY LUMBAR SPINE Right 01/30/2009   L5  . TOTAL KNEE ARTHROPLASTY Right 09/22/2007  . TOTAL KNEE ARTHROPLASTY Left 02/01/2007  . TRIGGER FINGER RELEASE Right 12/14/2013   Procedure: RELEASE TRIGGER FINGER/A-1 PULLEY RIGHT RING FINGER;  Surgeon: Daryll Brod, MD;  Location: [facility];  Service: Orthopedics;  Laterality: Right;  . TRIGGER FINGER RELEASE Left 02/11/2017   Procedure: RELEASE TRIGGER FINGER/A-1 PULLEY;  Surgeon: Leanora Cover, MD;  Location: [facility];  Service: Orthopedics;  Laterality: Left;   Social History   Occupational History  . Not on file  Tobacco Use  . Smoking status: Never Smoker  . Smokeless tobacco: Never Used  Substance and Sexual Activity  . Alcohol use: No  . Drug use: No  . Sexual activity: Never    Birth control/protection: Post-menopausal

## 2017-11-25 NOTE — Telephone Encounter (Signed)
Notification or Prior Authorization is not required for the requested services  This [employer] members plan does not currently require a prior authorization for [id number] & [id number].

## 2017-11-26 NOTE — Telephone Encounter (Signed)
Faxed to office

## 2017-12-07 ENCOUNTER — Telehealth (INDEPENDENT_AMBULATORY_CARE_PROVIDER_SITE_OTHER): Payer: Self-pay | Admitting: Physical Medicine and Rehabilitation

## 2017-12-07 ENCOUNTER — Other Ambulatory Visit (INDEPENDENT_AMBULATORY_CARE_PROVIDER_SITE_OTHER): Payer: Self-pay | Admitting: Physical Medicine and Rehabilitation

## 2017-12-07 MED ORDER — ACETAMINOPHEN-CODEINE #3 300-30 MG PO TABS: 1.0000 | ORAL_TABLET | Freq: Three times a day (TID) | ORAL | 0 refills | Status: DC | PRN

## 2017-12-07 NOTE — Telephone Encounter (Signed)
done

## 2017-12-10 DIAGNOSIS — I10 Essential (primary) hypertension: Secondary | ICD-10-CM | POA: Diagnosis not present

## 2017-12-10 DIAGNOSIS — E1100 Type 2 diabetes mellitus with hyperosmolarity without nonketotic hyperglycemic-hyperosmolar coma (NKHHC): Secondary | ICD-10-CM | POA: Diagnosis not present

## 2017-12-10 DIAGNOSIS — E034 Atrophy of thyroid (acquired): Secondary | ICD-10-CM | POA: Diagnosis not present

## 2017-12-14 ENCOUNTER — Ambulatory Visit (INDEPENDENT_AMBULATORY_CARE_PROVIDER_SITE_OTHER): Payer: Medicare Other | Admitting: Physical Medicine and Rehabilitation

## 2017-12-14 DIAGNOSIS — R202 Paresthesia of skin: Secondary | ICD-10-CM | POA: Diagnosis not present

## 2017-12-14 NOTE — Progress Notes (Signed)
 .  Numeric Pain Rating Scale and Functional Assessment Average Pain 6   In the last MONTH (on 0-10 scale) has pain interfered with the following?  1. General activity like being  able to carry out your everyday physical activities such as walking, climbing stairs, carrying groceries, or moving a chair?  Rating(5)   

## 2017-12-17 ENCOUNTER — Encounter (INDEPENDENT_AMBULATORY_CARE_PROVIDER_SITE_OTHER): Payer: Medicare Other | Admitting: Physical Medicine and Rehabilitation

## 2017-12-20 NOTE — Progress Notes (Signed)
Kaitlyn Jackson - 72 y.o. female MRN [id number]  Date of birth: [birth year]  Office Visit Note: Visit Date: 12/14/2017 PCP: Lucianne Lei, MD Referred by: Lucianne Lei, MD  Subjective: Chief Complaint  Patient presents with  . Right Hand - Numbness  . Left Hand - Numbness   HPI: Kaitlyn Jackson is a 72 y.o. female who comes in today For electrodiagnostic study of both upper limbs.  She has numbness in both hands particularly the radial digits.  She has right more than left symptoms.  She reports symptoms started a long time ago.  Please see our prior notes on her for further details and justification.  She has not had prior electrodiagnostic studies.  She has had prior trigger finger release by Dr. Fredna Dow.  She is a diabetic.  She typically sees Dr. Durward Fortes for orthopedic care but I been seeing her for her back most recently.  ROS Otherwise per HPI.  Assessment & Plan: Visit Diagnoses:  1. Paresthesia of skin     Plan: Impression: The above electrodiagnostic study is ABNORMAL and reveals evidence of:  1.  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 The lesion is characterized by sensory and motor demyelination with evidence of significant axonal injury.   2. A severe left median nerve entrapment at the wrist (carpal tunnel syndrome) affecting sensory and motor components.   There is no significant electrodiagnostic evidence of any other focal nerve entrapment, brachial plexopathy, cervical radiculopathy or generalized peripheral neuropathy. The above electrodiagnostic study is ABNORMAL and reveals evidence of   Recommendations: 1.  Follow-up with referring physician. 2.  Continue current management of symptoms. 3.  Suggest surgical evaluation.   Meds & Orders: No orders of the defined types were placed in this encounter.   Orders Placed This Encounter  Procedures  . NCV with EMG  (electromyography)    Follow-up: Return for Joni Fears, MD.   Procedures: No procedures performed  EMG & NCV Findings: Evaluation of the left median motor and the right median motor nerves showed prolonged distal onset latency (L9.5, R4.8 ms), reduced amplitude (L4.1, R0.2 mV), and decreased conduction velocity (Elbow-Wrist, L39, R40 m/s).  The left ulnar motor nerve showed decreased conduction velocity (B Elbow-Wrist, 47 m/s).  The left median (across palm) sensory and the right median (across palm) sensory nerves showed no response (Wrist) and no response (Palm).  The left ulnar sensory nerve showed prolonged distal peak latency (4.6 ms), reduced amplitude (13.2 V), and decreased conduction velocity (Wrist-5th Digit, 30 m/s).  The right ulnar sensory nerve showed prolonged distal peak latency (4.0 ms) and decreased conduction velocity (Wrist-5th Digit, 35 m/s).  All remaining nerves (as indicated in the following tables) were within normal limits.  Left vs. Right side comparison data for the median motor nerve indicates abnormal L-R latency difference (4.7 ms) and abnormal L-R amplitude difference (95.1 %).  The ulnar motor nerve indicates abnormal L-R latency difference (0.9 ms).  The ulnar sensory nerve indicates abnormal L-R latency difference (0.6 ms).    Needle evaluation of the right abductor pollicis brevis muscle showed increased insertional activity and widespread spontaneous activity.  All remaining muscles (as indicated in the following table) showed no evidence of electrical instability.    Impression: The above electrodiagnostic study is ABNORMAL and reveals evidence of:  1.  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  The lesion is characterized by sensory and motor demyelination with evidence of significant axonal injury.   2. A severe left median nerve entrapment at the wrist (carpal tunnel syndrome)  affecting sensory and motor components.   There is no significant electrodiagnostic evidence of any other focal nerve entrapment, brachial plexopathy, cervical radiculopathy or generalized peripheral neuropathy. The above electrodiagnostic study is ABNORMAL and reveals evidence of   Recommendations: 1.  Follow-up with referring physician. 2.  Continue current management of symptoms. 3.  Suggest surgical evaluation.  ___________________________ Laurence Spates FAAPMR Board Certified, American Board of Physical Medicine and Rehabilitation    Nerve Conduction Studies Anti Sensory Summary Table   Stim Site NR Peak (ms) Norm Peak (ms) P-T Amp (V) Norm P-T Amp Site1 Site2 Delta-P (ms) Dist (cm) Vel (m/s) Norm Vel (m/s)  Left Median Acr Palm Anti Sensory (2nd Digit)  31.3C  Wrist *NR  <3.6  >10 Wrist Palm  0.0    Palm *NR  <2.0          Right Median Acr Palm Anti Sensory (2nd Digit)  32.3C  Wrist *NR  <3.6  >10 Wrist Palm  0.0    Palm *NR  <2.0          Left Radial Anti Sensory (Base 1st Digit)  31C  Wrist    2.8 <3.1 10.4  Wrist Base 1st Digit 2.8 0.0    Right Radial Anti Sensory (Base 1st Digit)  34C  Wrist    2.7 <3.1 15.2  Wrist Base 1st Digit 2.7 0.0    Left Ulnar Anti Sensory (5th Digit)  31.4C  Wrist    *4.6 <3.7 *13.2 >15.0 Wrist 5th Digit 4.6 14.0 *30 >38  Right Ulnar Anti Sensory (5th Digit)  32.9C  Wrist    *4.0 <3.7 15.4 >15.0 Wrist 5th Digit 4.0 14.0 *35 >38   Motor Summary Table   Stim Site NR Onset (ms) Norm Onset (ms) O-P Amp (mV) Norm O-P Amp Site1 Site2 Delta-0 (ms) Dist (cm) Vel (m/s) Norm Vel (m/s)  Left Median Motor (Abd Poll Brev)  31.2C  Wrist    *9.5 <4.2 *4.1 >5 Elbow Wrist 5.7 22.0 *39 >50  Elbow    15.2  3.5         Right Median Motor (Abd Poll Brev)  33.8C  Wrist    *4.8 <4.2 *0.2 >5 Elbow Wrist 5.5 22.0 *40 >50  Elbow    10.3  0.0         Left Ulnar Motor (Abd Dig Min)  31.3C  Wrist    3.9 <4.2 7.0 >3 B Elbow Wrist 4.5 21.0 *47 >53  B Elbow     8.4  6.8  A Elbow B Elbow 1.5 10.0 67 >53  A Elbow    9.9  6.8         Right Ulnar Motor (Abd Dig Min)  33.6C  Wrist    3.0 <4.2 7.3 >3 B Elbow Wrist 4.0 22.0 55 >53  B Elbow    7.0  7.4  A Elbow B Elbow 1.2 9.0 75 >53  A Elbow    8.2  7.3          EMG   Side Muscle Nerve Root Ins Act Fibs Psw Amp Dur Poly Recrt Int Fraser Din Comment  Right Abd Poll Brev Median C8-T1 *Incr *4+ *4+ Nml Nml 0 Nml Nml + Muap  Right 1stDorInt Ulnar C8-T1 Nml Nml Nml Nml Nml 0 Nml Nml   Right  PronatorTeres Median C6-7 Nml Nml Nml Nml Nml 0 Nml Nml   Right Biceps Musculocut C5-6 Nml Nml Nml Nml Nml 0 Nml Nml   Right Deltoid Axillary C5-6 Nml Nml Nml Nml Nml 0 Nml Nml     Nerve Conduction Studies Anti Sensory Left/Right Comparison   Stim Site L Lat (ms) R Lat (ms) L-R Lat (ms) L Amp (V) R Amp (V) L-R Amp (%) Site1 Site2 L Vel (m/s) R Vel (m/s) L-R Vel (m/s)  Median Acr Palm Anti Sensory (2nd Digit)  31.3C  Wrist       Wrist Palm     Palm             Radial Anti Sensory (Base 1st Digit)  31C  Wrist 2.8 2.7 0.1 10.4 15.2 31.6 Wrist Base 1st Digit     Ulnar Anti Sensory (5th Digit)  31.4C  Wrist *4.6 *4.0 *0.6 *13.2 15.4 14.3 Wrist 5th Digit *30 *35 5   Motor Left/Right Comparison   Stim Site L Lat (ms) R Lat (ms) L-R Lat (ms) L Amp (mV) R Amp (mV) L-R Amp (%) Site1 Site2 L Vel (m/s) R Vel (m/s) L-R Vel (m/s)  Median Motor (Abd Poll Brev)  31.2C  Wrist *9.5 *4.8 *4.7 *4.1 *0.2 *95.1 Elbow Wrist *39 *40 1  Elbow 15.2 10.3 4.9 3.5 0.0 100.0       Ulnar Motor (Abd Dig Min)  31.3C  Wrist 3.9 3.0 *0.9 7.0 7.3 4.1 B Elbow Wrist *47 55 8  B Elbow 8.4 7.0 1.4 6.8 7.4 8.1 A Elbow B Elbow 67 75 8  A Elbow 9.9 8.2 1.7 6.8 7.3 6.8          Waveforms:                     Clinical History: MRI LUMBAR SPINE WITHOUT CONTRAST  TECHNIQUE: Multiplanar, multisequence MR imaging of the lumbar spine was performed. No intravenous contrast was administered.  COMPARISON:  Radiographs dated 08/11/2016 and  MRI dated 07/28/2016  FINDINGS: Segmentation:  Standard.  Alignment:  3 mm spondylolisthesis of L4 on L5.  Vertebrae: Severe chronic degenerative disc disease at T11-12 with marked disc space narrowing and prominent degenerative changes of the vertebral endplates with sclerosis in the vertebral bodies. Severe bilateral facet arthritis at L4-5 and moderately severe bilateral facet arthritis at L5-S1.  Conus medullaris and cauda equina: Conus extends to the L2-3 level. Conus and cauda equina appear normal.  Paraspinal and other soft tissues: Negative.  Disc levels:  T11-12: Severe degenerative disc disease. Broad-based disc protrusion with accompanying osteophytes with hypertrophy of the ligamentum flavum which combine to create moderately severe spinal stenosis. No discrete compression of the spinal cord or myelopathy. Moderately severe left foraminal stenosis.  T12-L1: Disc space narrowing with a small broad-based disc bulge with accompanying osteophytes without neural impingement.  L1-2: Negative.  L2-3: Disc is normal. Moderate hypertrophy of the ligamentum flavum and facet joints.  L3-4: Normal disc. Moderate hypertrophy of the ligamentum flavum and facet joints.  L4-5: Progressive severe bilateral facet arthritis with increased bilateral joint effusions. Hypertrophy of the ligamentum flavum on the left. Small broad-based soft disc protrusion. The findings create mild to moderate spinal stenosis. Slight right foraminal stenosis, unchanged.  L5-S1: Previous right laminectomy. Small chronic disc bulge to the left of midline without neural impingement. Moderate bilateral facet arthritis. No focal neural impingement.  IMPRESSION: 1. Chronic moderately severe spinal stenosis at T11-12 with moderately severe left foraminal stenosis.  2. Chronic progressive severe bilateral facet arthritis at L4-5 with mild spinal stenosis with a chronic small broad-based  soft disc protrusion, unchanged. 3. Moderate bilateral facet arthritis at L5-S1 and to a lesser degree at L2-3 and L3-4.   Electronically Signed   By: Lorriane Shire M.D.   On: 09/20/2017 12:38   She reports that she has never smoked. She has never used smokeless tobacco. No results for input(s): HGBA1C, LABURIC in the last 8760 hours.  Objective:  VS:  HT:    WT:   BMI:     BP:   HR: bpm  TEMP: ( )  RESP:  Physical Exam  Musculoskeletal:  Inspection reveals atrophy of the right APB compared to left.  She has decreased sensation in the median nerve distribution bilaterally.  She has a positive Phalen's test bilaterally.  She has a negative Hoffman's test.  She has good strength with wrist extension long finger flexion and abduction.    Ortho Exam Imaging: No results found.  Past Medical/Family/Surgical/Social History: Medications & Allergies reviewed per EMR, new medications updated. Patient Active Problem List   Diagnosis Date Noted  . 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 High risk medication use 01/06/2016  . 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 Vitamin D deficiency 01/06/2016  . Osteopenia 03/14/2012  . Diabetes mellitus type 2 in obese ([place]) 03/14/2012  . 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 Hypertension 03/14/2012   Past Medical History:  Diagnosis Date  . Clotting disorder ([place])   . Cough 02/08/2017  . Diabetic retinopathy ([place])   . 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 Full dentures   . GERD (gastroesophageal reflux disease)   . Heart murmur   . History of blood clots 1996   groin  . History of glaucoma    had laser correction  . Hypertension    states under control with meds., has been on med. since 1996  . Insulin dependent diabetes mellitus ([place])   . Mitral valve prolapse   . Osteoarthritis    bilateral knee  . Seasonal allergies   .  Sleep apnea    no CPAP use in several months, per pt.  . Trigger finger, left middle finger 01/2017   Family History  Problem Relation Age of Onset  . Cancer Mother        colon  . Cancer Cousin        lung   Past Surgical History:  Procedure Laterality Date  . BACK SURGERY     lower - removed  cysts  . CATARACT EXTRACTION W/ INTRAOCULAR LENS  IMPLANT, BILATERAL Bilateral   . GLAUCOMA SURGERY Bilateral    laser  . HEMILAMINOTOMY LUMBAR SPINE Right 01/30/2009   L5  . TOTAL KNEE ARTHROPLASTY Right 09/22/2007  . TOTAL KNEE ARTHROPLASTY Left 02/01/2007  . TRIGGER FINGER RELEASE Right 12/14/2013   Procedure: RELEASE TRIGGER FINGER/A-1 PULLEY RIGHT RING FINGER;  Surgeon: Daryll Brod, MD;  Location: [facility];  Service: Orthopedics;  Laterality: Right;  . TRIGGER FINGER RELEASE Left 02/11/2017   Procedure: RELEASE TRIGGER FINGER/A-1 PULLEY;  Surgeon: Leanora Cover, MD;  Location: [facility];  Service: Orthopedics;  Laterality: Left;   Social History   Occupational History  . Not on file  Tobacco Use  . Smoking status: Never Smoker  . Smokeless tobacco: Never Used  Substance and Sexual Activity  . Alcohol use: No  .  Drug use: No  . Sexual activity: Never    Birth control/protection: Post-menopausal

## 2017-12-20 NOTE — Progress Notes (Signed)
PLEASE SCHEDULE AND ADVISE PT

## 2017-12-20 NOTE — Procedures (Signed)
EMG & NCV Findings: Evaluation of the left median motor and the right median motor nerves showed prolonged distal onset latency (L9.5, R4.8 ms), reduced amplitude (L4.1, R0.2 mV), and decreased conduction velocity (Elbow-Wrist, L39, R40 m/s).  The left ulnar motor nerve showed decreased conduction velocity (B Elbow-Wrist, 47 m/s).  The left median (across palm) sensory and the right median (across palm) sensory nerves showed no response (Wrist) and no response (Palm).  The left ulnar sensory nerve showed prolonged distal peak latency (4.6 ms), reduced amplitude (13.2 V), and decreased conduction velocity (Wrist-5th Digit, 30 m/s).  The right ulnar sensory nerve showed prolonged distal peak latency (4.0 ms) and decreased conduction velocity (Wrist-5th Digit, 35 m/s).  All remaining nerves (as indicated in the following tables) were within normal limits.  Left vs. Right side comparison data for the median motor nerve indicates abnormal L-R latency difference (4.7 ms) and abnormal L-R amplitude difference (95.1 %).  The ulnar motor nerve indicates abnormal L-R latency difference (0.9 ms).  The ulnar sensory nerve indicates abnormal L-R latency difference (0.6 ms).    Needle evaluation of the right abductor pollicis brevis muscle showed increased insertional activity and widespread spontaneous activity.  All remaining muscles (as indicated in the following table) showed no evidence of electrical instability.    Impression: The above electrodiagnostic study is ABNORMAL and reveals evidence of:  1.  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 The lesion is characterized by sensory and motor demyelination with evidence of significant axonal injury.   2. A severe left median nerve entrapment at the wrist (carpal tunnel syndrome) affecting sensory and motor components.   There is no significant electrodiagnostic evidence of any other  focal nerve entrapment, brachial plexopathy, cervical radiculopathy or generalized peripheral neuropathy. The above electrodiagnostic study is ABNORMAL and reveals evidence of   Recommendations: 1.  Follow-up with referring physician. 2.  Continue current management of symptoms. 3.  Suggest surgical evaluation.  ___________________________ Kaitlyn Jackson FAAPMR Board Certified, American Board of Physical Medicine and Rehabilitation    Nerve Conduction Studies Anti Sensory Summary Table   Stim Site NR Peak (ms) Norm Peak (ms) P-T Amp (V) Norm P-T Amp Site1 Site2 Delta-P (ms) Dist (cm) Vel (m/s) Norm Vel (m/s)  Left Median Acr Palm Anti Sensory (2nd Digit)  31.3C  Wrist *NR  <3.6  >10 Wrist Palm  0.0    Palm *NR  <2.0          Right Median Acr Palm Anti Sensory (2nd Digit)  32.3C  Wrist *NR  <3.6  >10 Wrist Palm  0.0    Palm *NR  <2.0          Left Radial Anti Sensory (Base 1st Digit)  31C  Wrist    2.8 <3.1 10.4  Wrist Base 1st Digit 2.8 0.0    Right Radial Anti Sensory (Base 1st Digit)  34C  Wrist    2.7 <3.1 15.2  Wrist Base 1st Digit 2.7 0.0    Left Ulnar Anti Sensory (5th Digit)  31.4C  Wrist    *4.6 <3.7 *13.2 >15.0 Wrist 5th Digit 4.6 14.0 *30 >38  Right Ulnar Anti Sensory (5th Digit)  32.9C  Wrist    *4.0 <3.7 15.4 >15.0 Wrist 5th Digit 4.0 14.0 *35 >38   Motor Summary Table   Stim Site NR Onset (ms) Norm Onset (ms) O-P Amp (mV) Norm O-P Amp Site1 Site2 Delta-0 (ms) Dist (cm)  Vel (m/s) Norm Vel (m/s)  Left Median Motor (Abd Poll Brev)  31.2C  Wrist    *9.5 <4.2 *4.1 >5 Elbow Wrist 5.7 22.0 *39 >50  Elbow    15.2  3.5         Right Median Motor (Abd Poll Brev)  33.8C  Wrist    *4.8 <4.2 *0.2 >5 Elbow Wrist 5.5 22.0 *40 >50  Elbow    10.3  0.0         Left Ulnar Motor (Abd Dig Min)  31.3C  Wrist    3.9 <4.2 7.0 >3 B Elbow Wrist 4.5 21.0 *47 >53  B Elbow    8.4  6.8  A Elbow B Elbow 1.5 10.0 67 >53  A Elbow    9.9  6.8         Right Ulnar Motor (Abd Dig Min)   33.6C  Wrist    3.0 <4.2 7.3 >3 B Elbow Wrist 4.0 22.0 55 >53  B Elbow    7.0  7.4  A Elbow B Elbow 1.2 9.0 75 >53  A Elbow    8.2  7.3          EMG   Side Muscle Nerve Root Ins Act Fibs Psw Amp Dur Poly Recrt Int Fraser Din Comment  Right Abd Poll Brev Median C8-T1 *Incr *4+ *4+ Nml Nml 0 Nml Nml + Muap  Right 1stDorInt Ulnar C8-T1 Nml Nml Nml Nml Nml 0 Nml Nml   Right PronatorTeres Median C6-7 Nml Nml Nml Nml Nml 0 Nml Nml   Right Biceps Musculocut C5-6 Nml Nml Nml Nml Nml 0 Nml Nml   Right Deltoid Axillary C5-6 Nml Nml Nml Nml Nml 0 Nml Nml     Nerve Conduction Studies Anti Sensory Left/Right Comparison   Stim Site L Lat (ms) R Lat (ms) L-R Lat (ms) L Amp (V) R Amp (V) L-R Amp (%) Site1 Site2 L Vel (m/s) R Vel (m/s) L-R Vel (m/s)  Median Acr Palm Anti Sensory (2nd Digit)  31.3C  Wrist       Wrist Palm     Palm             Radial Anti Sensory (Base 1st Digit)  31C  Wrist 2.8 2.7 0.1 10.4 15.2 31.6 Wrist Base 1st Digit     Ulnar Anti Sensory (5th Digit)  31.4C  Wrist *4.6 *4.0 *0.6 *13.2 15.4 14.3 Wrist 5th Digit *30 *35 5   Motor Left/Right Comparison   Stim Site L Lat (ms) R Lat (ms) L-R Lat (ms) L Amp (mV) R Amp (mV) L-R Amp (%) Site1 Site2 L Vel (m/s) R Vel (m/s) L-R Vel (m/s)  Median Motor (Abd Poll Brev)  31.2C  Wrist *9.5 *4.8 *4.7 *4.1 *0.2 *95.1 Elbow Wrist *39 *40 1  Elbow 15.2 10.3 4.9 3.5 0.0 100.0       Ulnar Motor (Abd Dig Min)  31.3C  Wrist 3.9 3.0 *0.9 7.0 7.3 4.1 B Elbow Wrist *47 55 8  B Elbow 8.4 7.0 1.4 6.8 7.4 8.1 A Elbow B Elbow 67 75 8  A Elbow 9.9 8.2 1.7 6.8 7.3 6.8          Waveforms:

## 2017-12-28 NOTE — Progress Notes (Signed)
Schedule surgery.

## 2018-01-20 ENCOUNTER — Emergency Department (HOSPITAL_COMMUNITY)
Admission: EM | Admit: 2018-01-20 | Discharge: 2018-01-20 | Disposition: A | Discharge status: Home / Self Care | Payer: Medicare Other | Attending: Emergency Medicine | Admitting: Emergency Medicine

## 2018-01-20 ENCOUNTER — Other Ambulatory Visit: Payer: Self-pay

## 2018-01-20 ENCOUNTER — Emergency Department (HOSPITAL_COMMUNITY): Payer: Medicare Other

## 2018-01-20 DIAGNOSIS — S80212A Abrasion, left knee, initial encounter: Secondary | ICD-10-CM | POA: Insufficient documentation

## 2018-01-20 DIAGNOSIS — S0083XA Contusion of other part of head, initial encounter: Secondary | ICD-10-CM | POA: Insufficient documentation

## 2018-01-20 DIAGNOSIS — E119 Type 2 diabetes mellitus without complications: Secondary | ICD-10-CM | POA: Diagnosis not present

## 2018-01-20 DIAGNOSIS — S61213A Laceration without foreign body of left middle finger without damage to nail, initial encounter: Secondary | ICD-10-CM | POA: Insufficient documentation

## 2018-01-20 DIAGNOSIS — Y939 Activity, unspecified: Secondary | ICD-10-CM | POA: Diagnosis not present

## 2018-01-20 DIAGNOSIS — Z23 Encounter for immunization: Secondary | ICD-10-CM | POA: Insufficient documentation

## 2018-01-20 DIAGNOSIS — R58 Hemorrhage, not elsewhere classified: Secondary | ICD-10-CM | POA: Diagnosis not present

## 2018-01-20 DIAGNOSIS — S0003XA Contusion of scalp, initial encounter: Secondary | ICD-10-CM | POA: Diagnosis not present

## 2018-01-20 DIAGNOSIS — Y999 Unspecified external cause status: Secondary | ICD-10-CM | POA: Insufficient documentation

## 2018-01-20 DIAGNOSIS — M25562 Pain in left knee: Secondary | ICD-10-CM | POA: Diagnosis not present

## 2018-01-20 DIAGNOSIS — Z79899 Other long term (current) drug therapy: Secondary | ICD-10-CM | POA: Diagnosis not present

## 2018-01-20 DIAGNOSIS — I10 Essential (primary) hypertension: Secondary | ICD-10-CM | POA: Insufficient documentation

## 2018-01-20 DIAGNOSIS — I959 Hypotension, unspecified: Secondary | ICD-10-CM | POA: Diagnosis not present

## 2018-01-20 DIAGNOSIS — T148XXA Other injury of unspecified body region, initial encounter: Secondary | ICD-10-CM

## 2018-01-20 DIAGNOSIS — Z794 Long term (current) use of insulin: Secondary | ICD-10-CM | POA: Diagnosis not present

## 2018-01-20 DIAGNOSIS — M79642 Pain in left hand: Secondary | ICD-10-CM | POA: Diagnosis not present

## 2018-01-20 DIAGNOSIS — S8992XA Unspecified injury of left lower leg, initial encounter: Secondary | ICD-10-CM | POA: Diagnosis not present

## 2018-01-20 DIAGNOSIS — S0990XA Unspecified injury of head, initial encounter: Secondary | ICD-10-CM | POA: Diagnosis not present

## 2018-01-20 DIAGNOSIS — W19XXXA Unspecified fall, initial encounter: Secondary | ICD-10-CM

## 2018-01-20 DIAGNOSIS — S6992XA Unspecified injury of left wrist, hand and finger(s), initial encounter: Secondary | ICD-10-CM | POA: Diagnosis not present

## 2018-01-20 DIAGNOSIS — W0110XA Fall on same level from slipping, tripping and stumbling with subsequent striking against unspecified object, initial encounter: Secondary | ICD-10-CM | POA: Diagnosis not present

## 2018-01-20 DIAGNOSIS — R609 Edema, unspecified: Secondary | ICD-10-CM | POA: Diagnosis not present

## 2018-01-20 DIAGNOSIS — R51 Headache: Secondary | ICD-10-CM | POA: Diagnosis present

## 2018-01-20 DIAGNOSIS — Y929 Unspecified place or not applicable: Secondary | ICD-10-CM | POA: Diagnosis not present

## 2018-01-20 DIAGNOSIS — Z743 Need for continuous supervision: Secondary | ICD-10-CM | POA: Diagnosis not present

## 2018-01-20 MED ORDER — LIDOCAINE HCL 2 % IJ SOLN
20.0000 mL | Freq: Once | INTRAMUSCULAR | Status: AC
  Administered 2018-01-20: 400 mg
  Filled 2018-01-20: qty 20

## 2018-01-20 MED ORDER — ACETAMINOPHEN 325 MG PO TABS
650.0000 mg | ORAL_TABLET | Freq: Once | ORAL | Status: AC
  Administered 2018-01-20: 650 mg via ORAL
  Filled 2018-01-20: qty 2

## 2018-01-20 MED ORDER — TETANUS-DIPHTH-ACELL PERTUSSIS 5-2.5-18.5 LF-MCG/0.5 IM SUSP
0.5000 mL | Freq: Once | INTRAMUSCULAR | Status: AC
  Administered 2018-01-20: 0.5 mL via INTRAMUSCULAR
  Filled 2018-01-20: qty 0.5

## 2018-01-20 NOTE — ED Triage Notes (Signed)
Patient was walking to her car and stumbled.  She suffered a ground level fall injuring her left knee, Left hand middle finger and left wrist.  Denies dizziness, LOC.

## 2018-01-20 NOTE — ED Provider Notes (Signed)
  Face-to-face evaluation   History: Patient fell today, when she tripped.  She has injuries to left hand, left knee, and left forehead.  Physical exam: Obese, alert and cooperative.  Head with small contusion left forehead, no associated abrasion or crepitation.  Left knee mildly tender anteriorly with small abrasion but normal range of motion.  Medical screening examination/treatment/procedure(s) were conducted as a shared visit with non-physician practitioner(s) and myself.  I personally evaluated the patient during the encounter    Daleen Bo, MD 01/21/18 1357

## 2018-01-20 NOTE — ED Notes (Signed)
Got patient undress on the monitor patient is resting with call bell in reach and family at bedside 

## 2018-01-20 NOTE — ED Provider Notes (Signed)
[facility] EMERGENCY DEPARTMENT Provider Note   CSN: [id number] Arrival date & time: 01/20/18  1402     History   Chief Complaint Chief Complaint  Patient presents with  . Fall    HPI Kaitlyn Jackson is a 72 y.o. female.  HPI   72 year old female presents today status post fall.  Patient notes she was ambulating with traction both hands.  She notes she tripped fell landed on her left knee left hand and struck the left side of her face.  Patient notes a generalized headache with no associated neurological deficits or neck pain.  She denies any loss of consciousness.  Patient notes a small bruise to the left temporal/forehead. She notes pain in the knee with ambulation. Pain in the left 3rd digit at the PIP with laceration. She denies neck back chest abdomen or pelvic pain.  Patient notes she is currently on Coumadin.  Past Medical History:  Diagnosis Date  . Clotting disorder ([place])   . Cough 02/08/2017  . Diabetic retinopathy ([place])   . 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 Full dentures   . GERD (gastroesophageal reflux disease)   . Heart murmur   . History of blood clots 1996   groin  . History of glaucoma    had laser correction  . Hypertension    states under control with meds., has been on med. since 1996  . Insulin dependent diabetes mellitus ([place])   . Mitral valve prolapse   . Osteoarthritis    bilateral knee  . Seasonal allergies   . Sleep apnea    no CPAP use in several months, per pt.  . Trigger finger, left middle finger 01/2017    Patient Active Problem List   Diagnosis Date Noted  . 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 High risk medication use 01/06/2016  . 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 Vitamin D deficiency 01/06/2016  . Osteopenia 03/14/2012  . Diabetes mellitus type 2 in obese ([place]) 03/14/2012  . ASCVD (arteriosclerotic  cardiovascular disease) 03/14/2012  . Hypertension 03/14/2012    Past Surgical History:  Procedure Laterality Date  . BACK SURGERY     lower - removed  cysts  . CATARACT EXTRACTION W/ INTRAOCULAR LENS  IMPLANT, BILATERAL Bilateral   . GLAUCOMA SURGERY Bilateral    laser  . HEMILAMINOTOMY LUMBAR SPINE Right 01/30/2009   L5  . TOTAL KNEE ARTHROPLASTY Right 09/22/2007  . TOTAL KNEE ARTHROPLASTY Left 02/01/2007  . TRIGGER FINGER RELEASE Right 12/14/2013   Procedure: RELEASE TRIGGER FINGER/A-1 PULLEY RIGHT RING FINGER;  Surgeon: Daryll Brod, MD;  Location: [facility];  Service: Orthopedics;  Laterality: Right;  . TRIGGER FINGER RELEASE Left 02/11/2017   Procedure: RELEASE TRIGGER FINGER/A-1 PULLEY;  Surgeon: Leanora Cover, MD;  Location: [facility];  Service: Orthopedics;  Laterality: Left;     OB History   None      Home Medications    Prior to Admission medications   Medication Sig Start Date End Date Taking? Authorizing Provider  acetaminophen (TYLENOL) 500 MG tablet Take 500-1,000 mg by mouth every 6 (six) hours as needed for mild pain.   Yes [provider]  acetaminophen-codeine (TYLENOL #3) 300-30 MG tablet Take 1 tablet by mouth every 8 (eight) hours as needed for moderate pain. 12/07/17  Yes Magnus Sinning, MD  atorvastatin (LIPITOR) 20 MG tablet Take 10 mg by  mouth at bedtime.   Yes [provider]  azelastine (ASTELIN) 0.1 % nasal spray Place 2 sprays into both nostrils 2 (two) times daily. Patient taking differently: Place 2 sprays into both nostrils 2 (two) times daily as needed for rhinitis.  11/10/17  Yes Padgett, Rae Halsted, MD  benazepril (LOTENSIN) 40 MG tablet Take 40 mg by mouth every morning.    Yes [provider]  cholecalciferol (VITAMIN D) 1000 UNITS tablet Take 1,000 Units by mouth at bedtime.    Yes [provider]  desonide (DESOWEN) 0.05 % lotion Apply 1 application topically 2 (two)  times daily as needed (rash).    Yes [provider]  esomeprazole (NEXIUM) 40 MG capsule Take 40 mg by mouth daily before breakfast.   Yes [provider]  fexofenadine (ALLEGRA) 180 MG tablet Take 180 mg by mouth daily as needed for allergies.    Yes [provider]  fluticasone (FLONASE) 50 MCG/ACT nasal spray Place 1 spray into both nostrils daily as needed for allergies.    Yes [provider]  folic acid (FOLVITE) 1 MG tablet Take 1 mg by mouth daily.   Yes [provider]  furosemide (LASIX) 40 MG tablet Take 40 mg by mouth daily.   Yes [provider]  insulin NPH-insulin regular (NOVOLIN 70/30) (70-30) 100 UNIT/ML injection Inject 4-5 Units into the skin See admin instructions. Check blood sugar and take 4 to 5 units depending on blood sugar.   Yes [provider]  metFORMIN (GLUCOPHAGE) 1000 MG tablet Take 1,000 mg by mouth at bedtime. 09/10/17  Yes [provider]  montelukast (SINGULAIR) 10 MG tablet Take 10 mg by mouth at bedtime.   Yes [provider]  pregabalin (LYRICA) 100 MG capsule Take 100 mg by mouth 2 (two) times daily.   Yes [provider]  sitaGLIPtin-metformin (JANUMET) 50-1000 MG tablet Take 1 tablet by mouth every morning.    Yes [provider]  Sulfacetamide Sodium (SODIUM SULFACETAMIDE EX) Apply 1 application topically every 6 (six) hours as needed (for itching).    Yes [provider]  verapamil (COVERA HS) 180 MG (CO) 24 hr tablet Take 180 mg by mouth 2 (two) times daily.   Yes [provider]  vitamin C (ASCORBIC ACID) 500 MG tablet Take 500 mg by mouth every morning.   Yes [provider]  warfarin (COUMADIN) 10 MG tablet Take 10 mg by mouth See admin instructions. Take 1 tablet by mouth every other day, alternating with 7.5mg  tablet 10/24/16  Yes [provider]  warfarin (COUMADIN) 7.5 MG tablet Take 7.5 mg by mouth See admin  instructions. Take 1 tablet by mouth every other day alternating with 10mg  tablet 10/24/16  Yes [provider]  ACCU-CHEK AVIVA PLUS test strip CHECK BLOOD GLUCOSE TWICE A DAY 10/04/17   [provider]  Blood Glucose Monitoring Suppl (GLUCOCOM BLOOD GLUCOSE MONITOR) DEVI Accu-Chek Aviva Plus Meter    [provider]  cyclobenzaprine (FLEXERIL) 10 MG tablet Take 1 tablet (10 mg total) by mouth 2 (two) times daily as needed for muscle spasms. Patient not taking: Reported on 01/20/2018 01/09/17   Gareth Morgan, MD  Insulin Pen Needle (FIFTY50 PEN NEEDLES) 31G X 8 MM MISC BD Ultra-Fine Short Pen Needle 31 gauge x 5/16"    [provider]  Insulin Pen Needle (NOVOFINE PLUS) 32G X 4 MM MISC NovoFine Plus 32 gauge x 1/6" needle  USE 2 TIMES DAILY  [provider]  Insulin Pen Needle (NOVOFINE) 30G X 8 MM MISC NovoFine 30 30 gauge x 1/3" needle    [provider]  Lancet Devices ([employer] LANCING DEVICE) MISC Accu-Chek FastClix Lancing Device    [provider]    Family History Family History  Problem Relation Age of Onset  . Cancer Mother        colon  . Cancer Cousin        lung    Social History Social History   Tobacco Use  . Smoking status: Never Smoker  . Smokeless tobacco: Never Used  Substance Use Topics  . Alcohol use: No  . Drug use: No     Allergies   Patient has no known allergies.   Review of Systems Review of Systems  All other systems reviewed and are negative.    Physical Exam Updated Vital Signs BP 131/68 (BP Location: Right Arm)   Pulse (!) 56   Temp 98.1 F (36.7 C) (Oral)   Resp 18   Ht 5\' 4"  (1.626 m)   Wt 105.7 kg   SpO2 98%   BMI 39.99 kg/m   Physical Exam  Constitutional: She is oriented to person, place, and time. She appears well-developed and well-nourished.  HENT:  Head: Normocephalic and atraumatic.  Hematoma noted to the left forehead  Eyes: Pupils are equal, round, and  reactive to light. Conjunctivae are normal. Right eye exhibits no discharge. Left eye exhibits no discharge. No scleral icterus.  Neck: Normal range of motion. No JVD present. No tracheal deviation present.  Pulmonary/Chest: Effort normal. No stridor.  Musculoskeletal:  1.5 cm laceration noted over the left third dorsal PIP, no tendon involvement-full active range of motion distal sensation intact-no foreign bodies-remainder of hand nontender to palpation  No CT or L-spine tenderness palpation, chest nontender to palpation abdomen soft nontender hips stable with AP and lateral compression-no pain with internal and external rotation of lower extremities-left knee with superficial abrasion no joint swelling, full active range of motion minimally tender to anterior tibia  Neurological: She is alert and oriented to person, place, and time. No cranial nerve deficit or sensory deficit. She exhibits normal muscle tone. Coordination normal.  Psychiatric: She has a normal mood and affect. Her behavior is normal. Judgment and thought content normal.  Nursing note and vitals reviewed.    ED Treatments / Results  Labs (all labs ordered are listed, but only abnormal results are displayed) Labs Reviewed - No data to display  EKG None  Radiology Ct Head Wo Contrast  Result Date: 01/20/2018 CLINICAL DATA:  Patient was walking to her car and stumbled. She suffered a ground level fall injuring her left knee, Left hand middle finger and left wrist. Denies dizziness, LOC. EXAM: CT HEAD WITHOUT CONTRAST TECHNIQUE: Contiguous axial images were obtained from the base of the skull through the vertex without intravenous contrast. COMPARISON:  None. FINDINGS: Brain: No evidence of acute infarction, hemorrhage, hydrocephalus, extra-axial collection or mass lesion/mass effect. Vascular: No hyperdense vessel or unexpected calcification. Skull: Normal. Negative for fracture or focal lesion. Sinuses/Orbits: Globes and  orbits are unremarkable. The visualized sinuses and mastoid air cells are clear. Other: Small left lateral frontal/periorbital scalp hematoma. IMPRESSION: 1. No acute intracranial abnormalities. 2. No skull fracture. 3. Small left lateral periorbital scalp hematoma. Electronically Signed   By: Lajean Manes M.D.   On: 01/20/2018 15:16   Dg Knee Complete 4 Views Left  Result Date: 01/20/2018 CLINICAL DATA:  Left  knee pain following a fall today. EXAM: LEFT KNEE - COMPLETE 4+ VIEW COMPARISON:  None. FINDINGS: Left total knee prosthesis in satisfactory position and alignment. No fracture or dislocation seen. IMPRESSION: No fracture or dislocation. Electronically Signed   By: Claudie Revering M.D.   On: 01/20/2018 14:56   Dg Hand Complete Left  Result Date: 01/20/2018 CLINICAL DATA:  Left hand pain following a fall today. EXAM: LEFT HAND - COMPLETE 3+ VIEW COMPARISON:  Left wrist dated 02/06/2011. FINDINGS: No fracture or dislocation seen. Triangular fibrocartilage calcifications are noted. IMPRESSION: 1. No fracture or dislocation. 2. Chondrocalcinosis. Electronically Signed   By: Claudie Revering M.D.   On: 01/20/2018 14:58    Procedures .Marland KitchenLaceration Repair Date/Time: 01/20/2018 4:55 PM Performed by: Okey Regal, PA-C Authorized by: Okey Regal, PA-C   Consent:    Consent obtained:  Verbal   Consent given by:  Patient   Alternatives discussed:  No treatment and delayed treatment Anesthesia (see MAR for exact dosages):    Anesthesia method:  Local infiltration   Local anesthetic:  Lidocaine 2% w/o epi Laceration details:    Location: Left third finger.   Length (cm):  1.5 Repair type:    Repair type:  Simple Pre-procedure details:    Preparation:  Patient was prepped and draped in usual sterile fashion Exploration:    Hemostasis achieved with:  Direct pressure   Wound extent: no fascia violation noted, no foreign bodies/material noted, no muscle damage noted, no nerve damage noted, no  tendon damage noted, no underlying fracture noted and no vascular damage noted     Contaminated: no   Treatment:    Area cleansed with:  Saline   Amount of cleaning:  Standard   Irrigation solution:  Sterile water and sterile saline   Visualized foreign bodies/material removed: no   Post-procedure details:    Dressing:  Antibiotic ointment   Patient tolerance of procedure:  Tolerated well, no immediate complications   (including critical care time)    Medications Ordered in ED Medications  acetaminophen (TYLENOL) tablet 650 mg (650 mg Oral Given 01/20/18 1612)  Tdap (BOOSTRIX) injection 0.5 mL (0.5 mLs Intramuscular Given 01/20/18 1614)  lidocaine (XYLOCAINE) 2 % (with pres) injection 400 mg (400 mg Infiltration Given by Other 01/20/18 1616)     Initial Impression / Assessment and Plan / ED Course  I have reviewed the triage vital signs and the nursing notes.  Pertinent labs & imaging results that were available during my care of the patient were reviewed by me and considered in my medical decision making (see chart for details).     Labs:   Imaging: CT head without, DG hand left, DG knee complete left  Consults:  Therapeutics: Lidocaine without  Discharge Meds:   Assessment/Plan: 72 year old female presents status post mechanical fall.  Well-appearing with laceration and superficial abrasions.  CT reassuring no acute neurological deficits, no other bony abnormalities.  Wound was pared without complication, discharged with outpatient follow-up and strict return precautions.  Patient verbalized understanding and agreement to today's plan had no further questions or concerns at the time discharge.   Final Clinical Impressions(s) / ED Diagnoses   Final diagnoses:  Fall, initial encounter  Laceration of left middle finger without foreign body without damage to nail, initial encounter  Abrasion of left knee, initial encounter  Hematoma    ED Discharge Orders    None        Okey Regal, PA-C 01/20/18 1656    Daleen Bo,  MD 01/21/18 1357

## 2018-01-20 NOTE — Discharge Instructions (Addendum)
Please read attached information. If you experience any new or worsening signs or symptoms please return to the emergency room for evaluation. Please follow-up with your primary care provider or specialist as discussed. Please use tylenol as needed for pain.

## 2018-01-24 ENCOUNTER — Encounter (INDEPENDENT_AMBULATORY_CARE_PROVIDER_SITE_OTHER): Payer: Medicare Other | Admitting: Ophthalmology

## 2018-01-24 DIAGNOSIS — I10 Essential (primary) hypertension: Secondary | ICD-10-CM | POA: Diagnosis not present

## 2018-01-24 DIAGNOSIS — H43813 Vitreous degeneration, bilateral: Secondary | ICD-10-CM

## 2018-01-24 DIAGNOSIS — E113292 Type 2 diabetes mellitus with mild nonproliferative diabetic retinopathy without macular edema, left eye: Secondary | ICD-10-CM

## 2018-01-24 DIAGNOSIS — H35033 Hypertensive retinopathy, bilateral: Secondary | ICD-10-CM

## 2018-01-24 DIAGNOSIS — E113211 Type 2 diabetes mellitus with mild nonproliferative diabetic retinopathy with macular edema, right eye: Secondary | ICD-10-CM | POA: Diagnosis not present

## 2018-01-24 DIAGNOSIS — E11319 Type 2 diabetes mellitus with unspecified diabetic retinopathy without macular edema: Secondary | ICD-10-CM | POA: Diagnosis not present

## 2018-01-24 DIAGNOSIS — H33302 Unspecified retinal break, left eye: Secondary | ICD-10-CM

## 2018-01-31 ENCOUNTER — Ambulatory Visit (INDEPENDENT_AMBULATORY_CARE_PROVIDER_SITE_OTHER): Payer: Medicare Other | Admitting: Podiatry

## 2018-01-31 ENCOUNTER — Encounter: Payer: Self-pay | Admitting: Podiatry

## 2018-01-31 DIAGNOSIS — M7752 Other enthesopathy of left foot: Secondary | ICD-10-CM

## 2018-01-31 DIAGNOSIS — D259 Leiomyoma of uterus, unspecified: Secondary | ICD-10-CM | POA: Insufficient documentation

## 2018-01-31 DIAGNOSIS — M329 Systemic lupus erythematosus, unspecified: Secondary | ICD-10-CM | POA: Insufficient documentation

## 2018-01-31 DIAGNOSIS — B351 Tinea unguium: Secondary | ICD-10-CM | POA: Diagnosis not present

## 2018-01-31 DIAGNOSIS — N950 Postmenopausal bleeding: Secondary | ICD-10-CM | POA: Insufficient documentation

## 2018-01-31 DIAGNOSIS — E0843 Diabetes mellitus due to underlying condition with diabetic autonomic (poly)neuropathy: Secondary | ICD-10-CM

## 2018-01-31 DIAGNOSIS — M1990 Unspecified osteoarthritis, unspecified site: Secondary | ICD-10-CM | POA: Insufficient documentation

## 2018-01-31 DIAGNOSIS — G4730 Sleep apnea, unspecified: Secondary | ICD-10-CM | POA: Insufficient documentation

## 2018-01-31 DIAGNOSIS — M79676 Pain in unspecified toe(s): Secondary | ICD-10-CM

## 2018-01-31 DIAGNOSIS — E871 Hypo-osmolality and hyponatremia: Secondary | ICD-10-CM | POA: Insufficient documentation

## 2018-01-31 DIAGNOSIS — R638 Other symptoms and signs concerning food and fluid intake: Secondary | ICD-10-CM | POA: Insufficient documentation

## 2018-01-31 DIAGNOSIS — N952 Postmenopausal atrophic vaginitis: Secondary | ICD-10-CM | POA: Insufficient documentation

## 2018-02-02 DIAGNOSIS — H401231 Low-tension glaucoma, bilateral, mild stage: Secondary | ICD-10-CM | POA: Diagnosis not present

## 2018-02-02 DIAGNOSIS — H02403 Unspecified ptosis of bilateral eyelids: Secondary | ICD-10-CM | POA: Diagnosis not present

## 2018-02-02 NOTE — Progress Notes (Signed)
   SUBJECTIVE Patient with a history of diabetes mellitus presents to office today complaining of elongated, thickened nails that cause pain while ambulating in shoes. She is unable to trim her own nails.  She also reports constant pain to the left second toe that began 6 months ago after stubbing it at home. Walking increases the pain. She has not had any treatment for the symptoms. Patient is here for further evaluation and treatment.   Past Medical History:  Diagnosis Date  . Clotting disorder ([place])   . Cough 02/08/2017  . Diabetic retinopathy ([place])   . 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 Full dentures   . GERD (gastroesophageal reflux disease)   . Heart murmur   . History of blood clots 1996   groin  . History of glaucoma    had laser correction  . Hypertension    states under control with meds., has been on med. since 1996  . Insulin dependent diabetes mellitus ([place])   . Mitral valve prolapse   . Osteoarthritis    bilateral knee  . Seasonal allergies   . Sleep apnea    no CPAP use in several months, per pt.  . Trigger finger, left middle finger 01/2017    OBJECTIVE General Patient is awake, alert, and oriented x 3 and in no acute distress. Derm Skin is dry and supple bilateral. Negative open lesions or macerations. Remaining integument unremarkable. Nails are tender, long, thickened and dystrophic with subungual debris, consistent with onychomycosis, 1-5 bilateral. No signs of infection noted. Vasc  DP and PT pedal pulses palpable bilaterally. Temperature gradient within normal limits.  Neuro Epicritic and protective threshold sensation diminished bilaterally.  Musculoskeletal Exam Pain with palpation to the left 2nd toe. No symptomatic pedal deformities noted bilateral. Muscular strength within normal limits.  ASSESSMENT 1. Diabetes Mellitus w/ peripheral neuropathy 2. Onychomycosis of nail due to dermatophyte bilateral 3. Left 2nd toe capsulitis   PLAN  OF CARE 1. Patient evaluated today. 2. Instructed to maintain good pedal hygiene and foot care. Stressed importance of controlling blood sugar.  3. Mechanical debridement of nails 1-5 bilaterally performed using a nail nipper. Filed with dremel without incident.  4. Injection of 0.5 mLs Celestone Soluspan injected into the left 2nd toe.  5. Return to clinic in 3 mos.     Edrick Kins, DPM [facility]  Dr. Edrick Kins, [address]                                        [place], [place] [id number]                Office [phone]  Fax [phone]

## 2018-02-03 ENCOUNTER — Ambulatory Visit: Payer: Medicare Other | Admitting: Podiatry

## 2018-02-03 DIAGNOSIS — I82409 Acute embolism and thrombosis of unspecified deep veins of unspecified lower extremity: Secondary | ICD-10-CM | POA: Diagnosis not present

## 2018-02-03 DIAGNOSIS — M130 Polyarthritis, unspecified: Secondary | ICD-10-CM | POA: Diagnosis not present

## 2018-02-03 DIAGNOSIS — I10 Essential (primary) hypertension: Secondary | ICD-10-CM | POA: Diagnosis not present

## 2018-02-03 DIAGNOSIS — Z0000 Encounter for general adult medical examination without abnormal findings: Secondary | ICD-10-CM | POA: Diagnosis not present

## 2018-02-03 DIAGNOSIS — E1100 Type 2 diabetes mellitus with hyperosmolarity without nonketotic hyperglycemic-hyperosmolar coma (NKHHC): Secondary | ICD-10-CM | POA: Diagnosis not present

## 2018-02-18 ENCOUNTER — Encounter: Payer: Medicare Other | Admitting: Rehabilitation

## 2018-02-22 ENCOUNTER — Other Ambulatory Visit: Payer: Self-pay | Admitting: Allergy

## 2018-02-22 DIAGNOSIS — R590 Localized enlarged lymph nodes: Secondary | ICD-10-CM | POA: Diagnosis not present

## 2018-02-25 ENCOUNTER — Ambulatory Visit: Payer: Medicare Other | Admitting: Rehabilitation

## 2018-03-01 ENCOUNTER — Other Ambulatory Visit: Payer: Self-pay

## 2018-03-01 ENCOUNTER — Ambulatory Visit: Payer: Medicare Other | Attending: Family Medicine

## 2018-03-01 DIAGNOSIS — M6281 Muscle weakness (generalized): Secondary | ICD-10-CM | POA: Diagnosis not present

## 2018-03-01 DIAGNOSIS — R2689 Other abnormalities of gait and mobility: Secondary | ICD-10-CM | POA: Insufficient documentation

## 2018-03-01 DIAGNOSIS — R2681 Unsteadiness on feet: Secondary | ICD-10-CM | POA: Diagnosis not present

## 2018-03-02 NOTE — Therapy (Signed)
[facility] [address] [place], [place], [address] Phone: [phone]   Fax:  [phone]  Physical Therapy Evaluation  Patient Details  Name: Kaitlyn Jackson MRN: [id number] Date of Birth: 1945/06/16 Referring Provider (PT): Dr. Lucianne Lei   Encounter Date: 03/01/2018  PT End of Session - 03/02/18 1324    Visit Number  1    Number of Visits  17    Date for PT Re-Evaluation  05/01/18    Authorization Type  [employer] Medicare primary and Medicaid secondary    PT Start Time  0935    PT Stop Time  1018    PT Time Calculation (min)  43 min    Equipment Utilized During Treatment  --   min guard to S prn   Activity Tolerance  Patient tolerated treatment well    Behavior During Therapy  [facility] for tasks assessed/performed       Past Medical History:  Diagnosis Date  . Clotting disorder ([place])   . Cough 02/08/2017  . Diabetic retinopathy ([place])   . 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 Full dentures   . GERD (gastroesophageal reflux disease)   . Heart murmur   . History of blood clots 1996   groin  . History of glaucoma    had laser correction  . Hypertension    states under control with meds., has been on med. since 1996  . Insulin dependent diabetes mellitus ([place])   . Mitral valve prolapse   . Osteoarthritis    bilateral knee  . Seasonal allergies   . Sleep apnea    no CPAP use in several months, per pt.  . Trigger finger, left middle finger 01/2017    Past Surgical History:  Procedure Laterality Date  . BACK SURGERY     lower - removed  cysts  . CATARACT EXTRACTION W/ INTRAOCULAR LENS  IMPLANT, BILATERAL Bilateral   . GLAUCOMA SURGERY Bilateral    laser  . HEMILAMINOTOMY LUMBAR SPINE Right 01/30/2009   L5  . TOTAL KNEE ARTHROPLASTY Right 09/22/2007  . TOTAL KNEE ARTHROPLASTY Left 02/01/2007  . TRIGGER FINGER RELEASE Right 12/14/2013   Procedure: RELEASE TRIGGER FINGER/A-1 PULLEY RIGHT  RING FINGER;  Surgeon: Daryll Brod, MD;  Location: [facility];  Service: Orthopedics;  Laterality: Right;  . TRIGGER FINGER RELEASE Left 02/11/2017   Procedure: RELEASE TRIGGER FINGER/A-1 PULLEY;  Surgeon: Leanora Cover, MD;  Location: [facility];  Service: Orthopedics;  Laterality: Left;    There were no vitals filed for this visit.   Subjective Assessment - 03/01/18 0941    Subjective  Pt reported balance issues began about one year ago, especially when getting up in the middle of the night due to unsteadiness. Pt fell once in six months, she's not sure what happened but she was taking out her trash and fell on the sidewalk. Pt hit her head and had to have stitches on R middle finger. Pt reported head imaging was negative. She is the primary caregiver for her husband who has CHF and has been in/out of the hospital. Pt denied incr. in HA, weakness, N/T since hitting head. Pt does get HAs 2/2 intermittent low blood sugar and allergies. Pt reported she's very tired because she's doing so much at home, due to husband's inability to perform tasks at home. [facility] for her husband 3 days a week (11 hours), which began yesterday. Pt has  hx of back pain, currently being seen by MD. Pt reported she tripped on step stool in kitchen (second toe on L foot), podiatrist is monitoring it as it might be fractures (3 months ago)-pt had injection and it feels better.     Pertinent History  ASCVD, HTN, DM with retinopathy, R DVT in 1996 managed with medication, hx of glaucoma but treated with laser, osteopenia per chart, sleep apnea with CPAP (but pt doesn't use it), OA, discoid lupus erythematosus, B TKAs (R knee 2009 with pt reporting "cement coming loose", L knee 2008), Vitamin D,    Patient Stated Goals  "I want to know why my balance is off, and I want it better."    Currently in Pain?  No/denies         [facility] PT Assessment - 03/01/18 0956      Assessment    Medical Diagnosis  Falls, unsteady gait    Referring Provider (PT)  Dr. Lucianne Lei    Onset Date/Surgical Date  03/01/17    Hand Dominance  Right    Prior Therapy  none for balance      Precautions   Precautions  Fall    Precaution Comments  based on Hx and TUG time.       Restrictions   Weight Bearing Restrictions  No      Balance Screen   Has the patient fallen in the past 6 months  Yes    How many times?  1    Has the patient had a decrease in activity level because of a fear of falling?   Yes    Is the patient reluctant to leave their home because of a fear of falling?   No      Home Environment   Living Environment  Private residence    Living Arrangements  Spouse/significant other;Children   dtr is staying with them   Available Help at Discharge  Family;Available PRN/intermittently    Type of Home  Apartment    Home Access  Stairs to enter    Entrance Stairs-Number of Steps  8    Entrance Stairs-Rails  Cannot reach both    Home Layout  One level    [place] - single point;Walker - 2 wheels;Shower seat;Tub bench;Grab bars - tub/shower;Bedside commode      Prior Function   Level of Independence  Independent    Vocation  Retired    Leisure  Spending time with family, go to [employer], sang with gospel group (unable to do these things 2/2 husband's illness, go to the [employer]   Overall Cognitive Status  Within Functional Limits for tasks assessed      Sensation   Light Touch  Impaired by gross assessment;Appears Intact    Additional Comments  Intermittent N/T in hands/feet, likely neuropathy      ROM / Strength   AROM / PROM / Strength  AROM;Strength      AROM   Overall AROM   Within functional limits for tasks performed    Overall AROM Comments  BUE/LE WNL.       Strength   Overall Strength  Deficits    Overall Strength Comments  BLE: grossly 4+/5, except for B hip abd: 3+/5 in seated position. B hip ext not formally tested but  weakness suspected 2/2 gait deviations.       Transfers   Transfers  Sit to Stand;Stand to Sit    Sit  to Stand  5: Supervision;Without upper extremity assist;From chair/3-in-1    Stand to Sit  5: Supervision;Without upper extremity assist;To chair/3-in-1      Ambulation/Gait   Ambulation/Gait  Yes    Ambulation/Gait Assistance  5: Supervision    Ambulation/Gait Assistance Details  S for safety.    Ambulation Distance (Feet)  100 Feet    Assistive device  None    Gait Pattern  Step-through pattern;Decreased stride length;Decreased dorsiflexion - right;Decreased dorsiflexion - left;Decreased trunk rotation   L foot ER   Ambulation Surface  Level;Indoor    Gait velocity  2.46ft/sec.       Standardized Balance Assessment   Standardized Balance Assessment  Timed Up and Go Test      Timed Up and Go Test   TUG  Normal TUG    Normal TUG (seconds)  17.72   sec.                Objective measurements completed on examination: See above findings.              PT Education - 03/02/18 1324    Education Details  PT discussed outcome measure results. PT educated pt on PT POC, frequency and duration.     Person(s) Educated  Patient    Methods  Explanation    Comprehension  Verbalized understanding       PT Short Term Goals - 03/02/18 1331      PT SHORT TERM GOAL #1   Title  Pt will be IND with HEP to improve strength, balance, endurance, and flexibility. TARGET DATE FOR ALL STGS: 03/29/18    Status  New      PT SHORT TERM GOAL #2   Title  Pt will perform TUG with no AD in </=13.5 sec. to decr. falls risk.     Status  New      PT SHORT TERM GOAL #3   Title  Pt will improve gait speed with no AD to >/=2.61ft/sec. to safely ambulate in the community.     Status  New      PT SHORT TERM GOAL #4   Title  Perform BERG and DGI and write STG/LTGs as indicated.     Status  New      PT SHORT TERM GOAL #5   Title  Pt will amb. 300' over even terrain, IND, to safely amb.  at home.     Status  New        PT Long Term Goals - 03/02/18 1333      PT LONG TERM GOAL #1   Title  Pt will report return to [facility] to maintain gains made during PT. TARGET DATE FOR ALL LTGS: 04/26/18    Status  New      PT LONG TERM GOAL #2   Title  Pt will verbalize understanding of fall prevention strategies to decr. risk of falling.     Status  New      PT LONG TERM GOAL #3   Title  Pt will amb. 600', IND, over even/uneven terrain in order to safely take out garbage at home.     Status  New             Plan - 03/02/18 1325    Clinical Impression Statement  Pt is a pleasant 72y/o female presenting to OPPT neuro for unsteady gait and falls. Pt's PMH is significant for the following: ASCVD, HTN, DM with retinopathy, R DVT in 1996 managed  with medication, hx of glaucoma but treated with laser, osteopenia per chart, sleep apnea with CPAP (but pt doesn't use it), OA, discoid lupus erythematosus, B TKAs (R knee 2009 with pt reporting "cement coming loose", L knee 2008), Vitamin D. Pt's gait speed indicates is above the falls risk gait speed, however, pt's TUG time (and pt's hx of falls) indicate pt is at risk for falls. Pt's gait speed is below the threshold to be considered a safe community ambulator. PT will perform BERG and DGI next session, as not performed today 2/2 time constraints. The following impairments were noted upon exam: gait deviations, impaired balance, decr. strength, decr. endurance, and impaired flexibility. Pt would benefit from skilled PT to improve safety during functional mobility..    History and Personal Factors relevant to plan of care:  Pt is husband's primary caregiver, as he has CHF and is very ill. She has ceased going to the [facility] as she's fearful to leave husband and he requires much assist. Pt's family available prn. Pt only able to schedule one appt for now 2/2 checking husbands medical appt schedule-which could interfere with pt's progress. Pt not  currently using CPAP machine despite OSA dx. Possible diabetic neuropathy in feet and hands per pt.     Clinical Presentation  Evolving    Clinical Presentation due to:  ASCVD, HTN, DM with retinopathy, R DVT in 1996 managed with medication, hx of glaucoma but treated with laser, osteopenia per chart, sleep apnea with CPAP (but pt doesn't use it), OA, discoid lupus erythematosus, B TKAs (R knee 2009 with pt reporting "cement coming loose", L knee 2008), Vitamin D,    Clinical Decision Making  Moderate    Rehab Potential  Good    Clinical Impairments Affecting Rehab Potential  see above.     PT Frequency  2x / week    PT Duration  8 weeks    PT Treatment/Interventions  ADLs/Self Care Home Management;Biofeedback;Canalith Repostioning;Functional mobility training;Stair training;Gait training;DME Instruction;Orthotic Fit/Training;Patient/family education;Neuromuscular re-education;Balance training;Manual techniques;Therapeutic exercise;Therapeutic activities;Vestibular    PT Next Visit Plan  Perform BERG and DGI, write goals as indicated. Initiate HEP (strength, balance, flexibility-perhaps OTAGO). Fall prevention handout.     Consulted and Agree with Plan of Care  Patient       Patient will benefit from skilled therapeutic intervention in order to improve the following deficits and impairments:  Abnormal gait, Obesity, Decreased endurance, Decreased knowledge of use of DME, Decreased balance, Impaired flexibility, Decreased strength, Decreased mobility, Postural dysfunction  Visit Diagnosis: Other abnormalities of gait and mobility - Plan: PT plan of care cert/re-cert  Muscle weakness (generalized) - Plan: PT plan of care cert/re-cert  Unsteadiness on feet - Plan: PT plan of care cert/re-cert     Problem List Patient Active Problem List   Diagnosis Date Noted  . Sleep apnea 01/31/2018  . Atrophic vaginitis 01/31/2018  . Hyponatremia 01/31/2018  . Increased body mass index 01/31/2018  .  Osteoarthritis 01/31/2018  . Postmenopausal bleeding 01/31/2018  . Systemic lupus erythematosus ([place]) 01/31/2018  . Uterine leiomyoma 01/31/2018  . Trigger middle finger of left hand 01/11/2017  . 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 High risk medication use 01/06/2016  . 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 Vitamin D deficiency 01/06/2016  . Deep venous thrombosis of lower extremity ([employer]) 06/04/2015  . Osteopenia 03/14/2012  . Diabetes mellitus type 2 in obese ([place]) 03/14/2012  . ASCVD (arteriosclerotic cardiovascular disease)  03/14/2012  . Hypertension 03/14/2012    Kendrah Lovern L 03/02/2018, 1:36 PM  [facility] [address] [place], [place], [address] Phone: [phone]   Fax:  [phone]  Name: Kaitlyn Jackson MRN: [id number] Date of Birth: 12/07/1945   Geoffry Paradise, PT,DPT 03/02/18 1:37 PM Phone: [phone] Fax: [phone]

## 2018-03-04 ENCOUNTER — Encounter: Payer: Self-pay | Admitting: Physical Therapy

## 2018-03-04 ENCOUNTER — Ambulatory Visit: Payer: Medicare Other | Admitting: Physical Therapy

## 2018-03-04 DIAGNOSIS — M6281 Muscle weakness (generalized): Secondary | ICD-10-CM

## 2018-03-04 DIAGNOSIS — R2681 Unsteadiness on feet: Secondary | ICD-10-CM

## 2018-03-04 DIAGNOSIS — R2689 Other abnormalities of gait and mobility: Secondary | ICD-10-CM

## 2018-03-04 NOTE — Therapy (Signed)
[facility] [address] [place], [place], [address] Phone: [phone]   Fax:  [phone]  Physical Therapy Treatment  Patient Details  Name: Kaitlyn Jackson MRN: [id number] Date of Birth: 1945/09/02 Referring Provider (PT): Dr. Lucianne Lei   Encounter Date: 03/04/2018  PT End of Session - 03/04/18 1324    Visit Number  2    Number of Visits  17    Date for PT Re-Evaluation  05/01/18    Authorization Type  UHC Medicare primary and Medicaid secondary    PT Start Time  [id number]    PT Stop Time  0930    PT Time Calculation (min)  41 min    Equipment Utilized During Treatment  --   min guard to S prn   Activity Tolerance  Patient tolerated treatment well    Behavior During Therapy  [facility] for tasks assessed/performed       Past Medical History:  Diagnosis Date  . Clotting disorder ([place])   . Cough 02/08/2017  . Diabetic retinopathy ([place])   . 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 Full dentures   . GERD (gastroesophageal reflux disease)   . Heart murmur   . History of blood clots 1996   groin  . History of glaucoma    had laser correction  . Hypertension    states under control with meds., has been on med. since 1996  . Insulin dependent diabetes mellitus ([place])   . Mitral valve prolapse   . Osteoarthritis    bilateral knee  . Seasonal allergies   . Sleep apnea    no CPAP use in several months, per pt.  . Trigger finger, left middle finger 01/2017    Past Surgical History:  Procedure Laterality Date  . BACK SURGERY     lower - removed  cysts  . CATARACT EXTRACTION W/ INTRAOCULAR LENS  IMPLANT, BILATERAL Bilateral   . GLAUCOMA SURGERY Bilateral    laser  . HEMILAMINOTOMY LUMBAR SPINE Right 01/30/2009   L5  . TOTAL KNEE ARTHROPLASTY Right 09/22/2007  . TOTAL KNEE ARTHROPLASTY Left 02/01/2007  . TRIGGER FINGER RELEASE Right 12/14/2013   Procedure: RELEASE TRIGGER FINGER/A-1 PULLEY RIGHT RING  FINGER;  Surgeon: Daryll Brod, MD;  Location: [facility];  Service: Orthopedics;  Laterality: Right;  . TRIGGER FINGER RELEASE Left 02/11/2017   Procedure: RELEASE TRIGGER FINGER/A-1 PULLEY;  Surgeon: Leanora Cover, MD;  Location: [facility];  Service: Orthopedics;  Laterality: Left;    There were no vitals filed for this visit.  Subjective Assessment - 03/04/18 0854    Subjective  Pt reports her back hurting earlier this morning but hasnt really done anything since this morning.      Pertinent History  ASCVD, HTN, DM with retinopathy, R DVT in 1996 managed with medication, hx of glaucoma but treated with laser, osteopenia per chart, sleep apnea with CPAP (but pt doesn't use it), OA, discoid lupus erythematosus, B TKAs (R knee 2009 with pt reporting "cement coming loose", L knee 2008), Vitamin D,    Patient Stated Goals  "I want to know why my balance is off, and I want it better."    Currently in Pain?  No/denies                       [facility] Adult PT Treatment/Exercise - 03/04/18 0001      Ambulation/Gait   Ambulation/Gait  Yes  Ambulation/Gait Assistance  5: Supervision    Ambulation Distance (Feet)  100 Feet   times 3   Assistive device  None    Gait Pattern  Step-through pattern;Decreased stride length;Decreased dorsiflexion - right;Decreased dorsiflexion - left;Decreased trunk rotation    Ambulation Surface  Level;Indoor      Berg Balance Test   Sit to Stand  Able to stand  independently using hands    Standing Unsupported  Able to stand safely 2 minutes    Sitting with Back Unsupported but Feet Supported on Floor or Stool  Able to sit safely and securely 2 minutes    Stand to Sit  Controls descent by using hands    Transfers  Able to transfer safely, minor use of hands    Standing Unsupported with Eyes Closed  Able to stand 10 seconds safely    Standing Ubsupported with Feet Together  Able to place feet together independently and  stand 1 minute safely    From Standing, Reach Forward with Outstretched Arm  Can reach forward >12 cm safely (5")    From Standing Position, Pick up Object from Floor  Able to pick up shoe, needs supervision    From Standing Position, Turn to Look Behind Over each Shoulder  Turn sideways only but maintains balance    Turn 360 Degrees  Able to turn 360 degrees safely but slowly    Standing Unsupported, Alternately Place Feet on Step/Stool  Able to complete >2 steps/needs minimal assist    Standing Unsupported, One Foot in Front  Able to take small step independently and hold 30 seconds    Standing on One Leg  Unable to try or needs assist to prevent fall    Total Score  39      Dynamic Gait Index   Level Surface  Mild Impairment    Change in Gait Speed  Mild Impairment    Gait with Horizontal Head Turns  Severe Impairment    Gait with Vertical Head Turns  Mild Impairment    Gait and Pivot Turn  Mild Impairment    Step Over Obstacle  Severe Impairment    Step Around Obstacles  Normal    Steps  Moderate Impairment    Total Score  12             PT Education - 03/04/18 1323    Education Details  Results of DGI and Berg    Person(s) Educated  Patient    Methods  Explanation    Comprehension  Verbalized understanding       PT Short Term Goals - 03/02/18 1331      PT SHORT TERM GOAL #1   Title  Pt will be IND with HEP to improve strength, balance, endurance, and flexibility. TARGET DATE FOR ALL STGS: 03/29/18    Status  New      PT SHORT TERM GOAL #2   Title  Pt will perform TUG with no AD in </=13.5 sec. to decr. falls risk.     Status  New      PT SHORT TERM GOAL #3   Title  Pt will improve gait speed with no AD to >/=2.84ft/sec. to safely ambulate in the community.     Status  New      PT SHORT TERM GOAL #4   Title  Perform BERG and DGI and write STG/LTGs as indicated.     Status  New      PT SHORT TERM GOAL #5  Title  Pt will amb. 300' over even terrain, IND, to  safely amb. at home.     Status  New        PT Long Term Goals - 03/02/18 1333      PT LONG TERM GOAL #1   Title  Pt will report return to [facility] to maintain gains made during PT. TARGET DATE FOR ALL LTGS: 04/26/18    Status  New      PT LONG TERM GOAL #2   Title  Pt will verbalize understanding of fall prevention strategies to decr. risk of falling.     Status  New      PT LONG TERM GOAL #3   Title  Pt will amb. 600', IND, over even/uneven terrain in order to safely take out garbage at home.     Status  New            Plan - 03/04/18 1324    Clinical Impression Statement  Pt scored 12/24 on DGI and 39/56 on Berg.  Has difficulty with SLS and reaching outside BOS.  Pt states she will schedule additional appointments per POC but will have to do so around husbands care.  Continue PT per POC with PT to set DGI and Berg goals.    Rehab Potential  Good    Clinical Impairments Affecting Rehab Potential  see above.     PT Frequency  2x / week    PT Duration  8 weeks    PT Treatment/Interventions  ADLs/Self Care Home Management;Biofeedback;Canalith Repostioning;Functional mobility training;Stair training;Gait training;DME Instruction;Orthotic Fit/Training;Patient/family education;Neuromuscular re-education;Balance training;Manual techniques;Therapeutic exercise;Therapeutic activities;Vestibular    PT Next Visit Plan  PT to write goals as indicated for BERG and DGI. Initiate HEP (strength, balance, flexibility-perhaps OTAGO). Fall prevention handout.     Consulted and Agree with Plan of Care  Patient       Patient will benefit from skilled therapeutic intervention in order to improve the following deficits and impairments:  Abnormal gait, Obesity, Decreased endurance, Decreased knowledge of use of DME, Decreased balance, Impaired flexibility, Decreased strength, Decreased mobility, Postural dysfunction  Visit Diagnosis: Other abnormalities of gait and mobility  Muscle weakness  (generalized)  Unsteadiness on feet     Problem List Patient Active Problem List   Diagnosis Date Noted  . Sleep apnea 01/31/2018  . Atrophic vaginitis 01/31/2018  . Hyponatremia 01/31/2018  . Increased body mass index 01/31/2018  . Osteoarthritis 01/31/2018  . Postmenopausal bleeding 01/31/2018  . Systemic lupus erythematosus ([place]) 01/31/2018  . Uterine leiomyoma 01/31/2018  . Trigger middle finger of left hand 01/11/2017  . 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 High risk medication use 01/06/2016  . 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 Vitamin D deficiency 01/06/2016  . Deep venous thrombosis of lower extremity ([place]) 06/04/2015  . Osteopenia 03/14/2012  . Diabetes mellitus type 2 in obese ([place]) 03/14/2012  . 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 Hypertension 03/14/2012    Narda Bonds, PTA [place] 03/04/18 1:30 PM Phone: [phone] Fax: [facility] [address] [place], [place], [address] Phone: [phone]   Fax:  [phone]  Name: Kaitlyn Jackson MRN: [id number] Date of Birth: September 02, 1945

## 2018-03-07 ENCOUNTER — Ambulatory Visit: Payer: Medicare Other | Admitting: Physical Therapy

## 2018-03-07 ENCOUNTER — Encounter: Payer: Self-pay | Admitting: Physical Therapy

## 2018-03-07 DIAGNOSIS — M6281 Muscle weakness (generalized): Secondary | ICD-10-CM | POA: Diagnosis not present

## 2018-03-07 DIAGNOSIS — R2681 Unsteadiness on feet: Secondary | ICD-10-CM | POA: Diagnosis not present

## 2018-03-07 DIAGNOSIS — R2689 Other abnormalities of gait and mobility: Secondary | ICD-10-CM

## 2018-03-07 NOTE — Patient Instructions (Signed)
Access Code: [email or web address]  URL: [email or web address]  Date: 03/07/2018  Prepared by: Nita Sells   Exercises  Standing Single Leg Stance with Unilateral Counter Support - 5 reps - 10 seconds hold - 1x daily - 7x weekly  Tandem Stance with Support - 5 reps - 10 seconds hold - 1x daily - 7x weekly  Backward Walking with Counter Support - 2 reps - 1x daily - 7x weekly  Standing March with Counter Support - 10 reps - 1x daily - 7x weekly  Standing Hip Abduction with Counter Support - 10 reps - 1x daily - 7x weekly  Standing Hip Extension with Counter Support - 10 reps - 1x daily - 7x weekly

## 2018-03-07 NOTE — Therapy (Addendum)
[facility] [address] [place], [place], [address] Phone: [phone]   Fax:  [phone]  Physical Therapy Treatment  Patient Details  Name: Kaitlyn Jackson MRN: [id number] Date of Birth: 03/14/1946 Referring Provider (PT): Dr. Lucianne Lei   Encounter Date: 03/07/2018  PT End of Session - 03/07/18 1246    Visit Number  3    Number of Visits  17    Date for PT Re-Evaluation  05/01/18    Authorization Type  [employer] Medicare primary and Medicaid secondary    PT Start Time  1146    PT Stop Time  1230    PT Time Calculation (min)  44 min    Equipment Utilized During Treatment  --   min guard to S prn   Activity Tolerance  Patient tolerated treatment well    Behavior During Therapy  [facility] for tasks assessed/performed       Past Medical History:  Diagnosis Date  . Clotting disorder ([place])   . Cough 02/08/2017  . Diabetic retinopathy ([place])   . 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 Full dentures   . GERD (gastroesophageal reflux disease)   . Heart murmur   . History of blood clots 1996   groin  . History of glaucoma    had laser correction  . Hypertension    states under control with meds., has been on med. since 1996  . Insulin dependent diabetes mellitus ([place])   . Mitral valve prolapse   . Osteoarthritis    bilateral knee  . Seasonal allergies   . Sleep apnea    no CPAP use in several months, per pt.  . Trigger finger, left middle finger 01/2017    Past Surgical History:  Procedure Laterality Date  . BACK SURGERY     lower - removed  cysts  . CATARACT EXTRACTION W/ INTRAOCULAR LENS  IMPLANT, BILATERAL Bilateral   . GLAUCOMA SURGERY Bilateral    laser  . HEMILAMINOTOMY LUMBAR SPINE Right 01/30/2009   L5  . TOTAL KNEE ARTHROPLASTY Right 09/22/2007  . TOTAL KNEE ARTHROPLASTY Left 02/01/2007  . TRIGGER FINGER RELEASE Right 12/14/2013   Procedure: RELEASE TRIGGER FINGER/A-1 PULLEY RIGHT RING  FINGER;  Surgeon: Daryll Brod, MD;  Location: [facility];  Service: Orthopedics;  Laterality: Right;  . TRIGGER FINGER RELEASE Left 02/11/2017   Procedure: RELEASE TRIGGER FINGER/A-1 PULLEY;  Surgeon: Leanora Cover, MD;  Location: [facility];  Service: Orthopedics;  Laterality: Left;    There were no vitals filed for this visit.  Subjective Assessment - 03/07/18 1150    Subjective  Denies falls or changes.    Pertinent History  ASCVD, HTN, DM with retinopathy, R DVT in 1996 managed with medication, hx of glaucoma but treated with laser, osteopenia per chart, sleep apnea with CPAP (but pt doesn't use it), OA, discoid lupus erythematosus, B TKAs (R knee 2009 with pt reporting "cement coming loose", L knee 2008), Vitamin D,    Patient Stated Goals  "I want to know why my balance is off, and I want it better."    Currently in Pain?  No/denies            Balance Exercises - 03/07/18 1244      Balance Exercises: Standing   Other Standing Exercises  In parallel bars standing on foam with head turns/nods and tossing ball-with 1 UE support.  Stepping forward/backward across foam with 1 UE support.  Performed HEP as provided in Pt instructions.   Taps to 6' step alternating LE's with 1 UE support. Steps onto/off 6" step with 1 UE support x 15 reps alternating LE's   PT Education - 03/07/18 1246    Education Details  HEP    Person(s) Educated  Patient    Methods  Explanation;Demonstration;Handout    Comprehension  Verbalized understanding;Returned demonstration       PT Short Term Goals - 03/08/18 1021      PT SHORT TERM GOAL #1   Title  Pt will be IND with HEP to improve strength, balance, endurance, and flexibility. TARGET DATE FOR ALL STGS: 03/29/18    Status  New      PT SHORT TERM GOAL #2   Title  Pt will perform TUG with no AD in </=13.5 sec. to decr. falls risk.     Status  New      PT SHORT TERM GOAL #3   Title  Pt will improve gait speed  with no AD to >/=2.74ft/sec. to safely ambulate in the community.     Status  New      PT SHORT TERM GOAL #4   Title  Perform BERG and DGI and write STG/LTGs as indicated.     Status  Achieved      PT SHORT TERM GOAL #5   Title  Pt will amb. 300' over even terrain, IND, to safely amb. at home.     Status  New      Additional Short Term Goals   Additional Short Term Goals  Yes      PT SHORT TERM GOAL #6   Title  Pt will improve BERG score to >/=43/56 to decr. falls risk.     Baseline  39/56    Status  New      PT SHORT TERM GOAL #7   Title  Pt will improve DGI score to >/=14/24 to decr. falls risk.     Baseline  12/24    Status  New      PT Long Term Goals - 03/08/18 1022      PT LONG TERM GOAL #1   Title  Pt will report return to [facility] to maintain gains made during PT. TARGET DATE FOR ALL LTGS: 04/26/18    Status  New      PT LONG TERM GOAL #2   Title  Pt will verbalize understanding of fall prevention strategies to decr. risk of falling.     Status  New      PT LONG TERM GOAL #3   Title  Pt will amb. 600', IND, over even/uneven terrain in order to safely take out garbage at home.     Status  New      PT LONG TERM GOAL #4   Title  Pt will improve  BERG score to >/=47/56 to decr. falls risk.    Baseline  39/56    Status  New      PT LONG TERM GOAL #5   Title  Pt will improve DGI score to >/=16/24 to decr. falls risk.     Baseline  12/24    Status  New              Plan - 03/07/18 1247    Clinical Impression Statement  Skilled session focused on developing HEP along with balance activities.  Pt continues to be fearful of falling with anything that challenges her balance.  Continue PT per POC.  Rehab Potential  Good    Clinical Impairments Affecting Rehab Potential  see above.     PT Frequency  2x / week    PT Duration  8 weeks    PT Treatment/Interventions  ADLs/Self Care Home Management;Biofeedback;Canalith Repostioning;Functional mobility training;Stair  training;Gait training;DME Instruction;Orthotic Fit/Training;Patient/family education;Neuromuscular re-education;Balance training;Manual techniques;Therapeutic exercise;Therapeutic activities;Vestibular    PT Next Visit Plan  PT to write goals as indicated for BERG and DGI. Review HEP.  Fall prevention handout.     Consulted and Agree with Plan of Care  Patient       Patient will benefit from skilled therapeutic intervention in order to improve the following deficits and impairments:  Abnormal gait, Obesity, Decreased endurance, Decreased knowledge of use of DME, Decreased balance, Impaired flexibility, Decreased strength, Decreased mobility, Postural dysfunction  Visit Diagnosis: Other abnormalities of gait and mobility  Muscle weakness (generalized)  Unsteadiness on feet     Problem List Patient Active Problem List   Diagnosis Date Noted  . Sleep apnea 01/31/2018  . Atrophic vaginitis 01/31/2018  . Hyponatremia 01/31/2018  . Increased body mass index 01/31/2018  . Osteoarthritis 01/31/2018  . Postmenopausal bleeding 01/31/2018  . Systemic lupus erythematosus ([place]) 01/31/2018  . Uterine leiomyoma 01/31/2018  . Trigger middle finger of left hand 01/11/2017  . 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 High risk medication use 01/06/2016  . 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 Vitamin D deficiency 01/06/2016  . Deep venous thrombosis of lower extremity ([place]) 06/04/2015  . Osteopenia 03/14/2012  . Diabetes mellitus type 2 in obese ([place]) 03/14/2012  . 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 Hypertension 03/14/2012    Narda Bonds, PTA [place] 03/07/18 12:52 PM Phone: [phone] Fax: [facility] [address] [place], [place], [address] Phone: [phone]    Fax:  [phone]  Name: Kaitlyn Jackson MRN: [id number] Date of Birth: August 19, 1945

## 2018-03-11 ENCOUNTER — Ambulatory Visit: Payer: Medicare Other | Admitting: Physical Therapy

## 2018-03-14 ENCOUNTER — Ambulatory Visit: Payer: Medicare Other | Admitting: Physical Therapy

## 2018-03-17 DIAGNOSIS — M130 Polyarthritis, unspecified: Secondary | ICD-10-CM | POA: Diagnosis not present

## 2018-03-17 DIAGNOSIS — E1100 Type 2 diabetes mellitus with hyperosmolarity without nonketotic hyperglycemic-hyperosmolar coma (NKHHC): Secondary | ICD-10-CM | POA: Diagnosis not present

## 2018-03-17 DIAGNOSIS — I10 Essential (primary) hypertension: Secondary | ICD-10-CM | POA: Diagnosis not present

## 2018-03-17 DIAGNOSIS — I82409 Acute embolism and thrombosis of unspecified deep veins of unspecified lower extremity: Secondary | ICD-10-CM | POA: Diagnosis not present

## 2018-03-18 ENCOUNTER — Ambulatory Visit: Payer: Medicare Other | Attending: Family Medicine | Admitting: Physical Therapy

## 2018-03-18 DIAGNOSIS — M6281 Muscle weakness (generalized): Secondary | ICD-10-CM | POA: Insufficient documentation

## 2018-03-18 DIAGNOSIS — R2689 Other abnormalities of gait and mobility: Secondary | ICD-10-CM | POA: Insufficient documentation

## 2018-03-18 DIAGNOSIS — R2681 Unsteadiness on feet: Secondary | ICD-10-CM | POA: Insufficient documentation

## 2018-03-18 NOTE — Therapy (Signed)
[facility] [address] [place], [place], [address] Phone: [phone]   Fax:  [phone]  Patient Details  Name: Kaitlyn Jackson MRN: [id number] Date of Birth: [birth year] Referring Provider:  Lucianne Lei, MD  Encounter Date: 03/18/2018   Pt arrived for PT session and stated that she didn't think she could do therapy today due to 10/10 R knee pain but felt bad about cancelling appointment over the phone.  Pt's husband has been in hospital and has had to cancel last 2 appointments.  Has been having to assist husband more and feel like this has increased her knee pain.  Took tylenol and put cream on it but states it hasn't helped.  States usually if she stays off of it for a while the pain will get better.   Pt has another appointment on 03/23/2018.  Advised pt to not perform HEP until she returns on 1/8 and that she can call and cancel if needed.  Pt agrees.  Pt does have help several days a week for her husband but they were not able to come over holidays so she is hopefull she can get a little more assistance this week.    Narda Bonds, [place] 03/18/18 10:47 AM Phone: [phone] Fax: [facility] [address] [place], [place], [address] Phone: [phone]   Fax:  [phone]

## 2018-03-23 ENCOUNTER — Ambulatory Visit: Payer: Medicare Other

## 2018-03-25 ENCOUNTER — Ambulatory Visit: Payer: Medicare Other

## 2018-03-30 ENCOUNTER — Ambulatory Visit: Payer: Medicare Other | Admitting: Physical Therapy

## 2018-04-01 ENCOUNTER — Ambulatory Visit: Payer: Medicare Other

## 2018-04-22 ENCOUNTER — Encounter (HOSPITAL_COMMUNITY): Payer: Self-pay | Admitting: Emergency Medicine

## 2018-04-22 ENCOUNTER — Ambulatory Visit (HOSPITAL_COMMUNITY)
Admission: EM | Admit: 2018-04-22 | Discharge: 2018-04-22 | Disposition: A | Discharge status: Home / Self Care | Payer: Medicare Other | Attending: Internal Medicine | Admitting: Internal Medicine

## 2018-04-22 DIAGNOSIS — M25519 Pain in unspecified shoulder: Secondary | ICD-10-CM | POA: Diagnosis not present

## 2018-04-22 DIAGNOSIS — M542 Cervicalgia: Secondary | ICD-10-CM | POA: Diagnosis not present

## 2018-04-22 MED ORDER — HYDROCODONE-ACETAMINOPHEN 5-325 MG PO TABS: 1.0000 | ORAL_TABLET | Freq: Four times a day (QID) | ORAL | 0 refills | Status: DC | PRN

## 2018-04-22 NOTE — Discharge Instructions (Addendum)
Prescription for a small number (6 tablets) of vicodin, for emergency pain, were given.  Continue tylenol as you are.  Use ice or heat for 5-10 minutes several times daily to help decrease pain.  Some pages of gentle stretching exercises are attached to this handout, to see if this improves your pain.  Lying down in the same position for prolonged periods may increase pain.  Followup with your primary care provider to discuss next steps for managing pain.

## 2018-04-22 NOTE — ED Triage Notes (Signed)
Pt c/o left sided neck pain onset 4-5 days .Marland Kitchen.. pain radiates across shoulder  Reports pain increases when she turns her head.  Taking acetaminophen and ibuprofen w/temp relief.   Denies inj/trauma, dyspnea, nauseas, diaphoresis   A&O x4... NAD.Marland Kitchen. ambulatory... speaking in complete sentences.

## 2018-04-22 NOTE — ED Provider Notes (Signed)
[name]    CSN: [id number] Arrival date & time: 04/22/18  1206     History   Chief Complaint Chief Complaint  Patient presents with  . Neck Pain    HPI Kaitlyn Jackson is a 73 y.o. female.   She presents today with 4 to 5 days history of pain in her posterior neck and shoulders, particularly the left.  This is most noticeable when she is turning her head, but also some discomfort with movement of the left shoulder.  She says it feels like she might have slept on it wrong.  For the last several months, she has actually been spending a fair amount of time during the day in bed, "laying around," with her husband, who is severely ill.  She is sort of afraid to leave him because his health status is tenuous.    HPI  Past Medical History:  Diagnosis Date  . Clotting disorder ([place])   . Cough 02/08/2017  . Diabetic retinopathy ([place])   . 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 Full dentures   . GERD (gastroesophageal reflux disease)   . Heart murmur   . History of blood clots 1996   groin  . History of glaucoma    had laser correction  . Hypertension    states under control with meds., has been on med. since 1996  . Insulin dependent diabetes mellitus ([place])   . Mitral valve prolapse   . Osteoarthritis    bilateral knee  . Seasonal allergies   . Sleep apnea    no CPAP use in several months, per pt.  . Trigger finger, left middle finger 01/2017    Patient Active Problem List   Diagnosis Date Noted  . Sleep apnea 01/31/2018  . Atrophic vaginitis 01/31/2018  . Hyponatremia 01/31/2018  . Increased body mass index 01/31/2018  . Osteoarthritis 01/31/2018  . Postmenopausal bleeding 01/31/2018  . Systemic lupus erythematosus ([place]) 01/31/2018  . Uterine leiomyoma 01/31/2018  . Trigger middle finger of left hand 01/11/2017  . 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 High risk medication use 01/06/2016  . S/P TKR (total knee replacement),  bilateral 01/06/2016  . 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 Vitamin D deficiency 01/06/2016  . Deep venous thrombosis of lower extremity ([place]) 06/04/2015  . Osteopenia 03/14/2012  . Diabetes mellitus type 2 in obese ([place]) 03/14/2012  . 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 Hypertension 03/14/2012    Past Surgical History:  Procedure Laterality Date  . BACK SURGERY     lower - removed  cysts  . CATARACT EXTRACTION W/ INTRAOCULAR LENS  IMPLANT, BILATERAL Bilateral   . GLAUCOMA SURGERY Bilateral    laser  . HEMILAMINOTOMY LUMBAR SPINE Right 01/30/2009   L5  . TOTAL KNEE ARTHROPLASTY Right 09/22/2007  . TOTAL KNEE ARTHROPLASTY Left 02/01/2007  . TRIGGER FINGER RELEASE Right 12/14/2013   Procedure: RELEASE TRIGGER FINGER/A-1 PULLEY RIGHT RING FINGER;  Surgeon: Daryll Brod, MD;  Location: [facility];  Service: Orthopedics;  Laterality: Right;  . TRIGGER FINGER RELEASE Left 02/11/2017   Procedure: RELEASE TRIGGER FINGER/A-1 PULLEY;  Surgeon: Leanora Cover, MD;  Location: [facility];  Service: Orthopedics;  Laterality: Left;       Home Medications    Prior to Admission medications   Medication Sig Start Date End Date Taking? Authorizing Provider  acetaminophen (TYLENOL) 500 MG tablet Take 500-1,000 mg by mouth every 6 (six) hours as needed  for mild pain.   Yes [provider]  atorvastatin (LIPITOR) 20 MG tablet Take 10 mg by mouth at bedtime.   Yes [provider]  azelastine (ASTELIN) 0.1 % nasal spray PLACE 2 SPRAYS INTO BOTH NOSTRILS 2 (TWO) TIMES DAILY. 02/22/18  Yes Padgett, Rae Halsted, MD  benazepril (LOTENSIN) 40 MG tablet Take 40 mg by mouth every morning.    Yes [provider]  Blood Glucose Monitoring Suppl (GLUCOCOM BLOOD GLUCOSE MONITOR) DEVI Accu-Chek Aviva Plus Meter   Yes [provider]  cholecalciferol (VITAMIN D) 1000 UNITS tablet Take 1,000 Units by mouth at  bedtime.    Yes [provider]  esomeprazole (NEXIUM) 40 MG capsule Take 40 mg by mouth daily before breakfast.   Yes [provider]  fexofenadine (ALLEGRA) 180 MG tablet Take 180 mg by mouth daily as needed for allergies.    Yes [provider]  fluticasone (FLONASE) 50 MCG/ACT nasal spray Place 1 spray into both nostrils daily as needed for allergies.    Yes [provider]  folic acid (FOLVITE) 1 MG tablet Take 1 mg by mouth daily.   Yes [provider]  furosemide (LASIX) 40 MG tablet Take 40 mg by mouth daily.   Yes [provider]  insulin NPH-insulin regular (NOVOLIN 70/30) (70-30) 100 UNIT/ML injection Inject 4-5 Units into the skin See admin instructions. Check blood sugar and take 4 to 5 units depending on blood sugar.   Yes [provider]  Insulin Pen Needle (FIFTY50 PEN NEEDLES) 31G X 8 MM MISC BD Ultra-Fine Short Pen Needle 31 gauge x 5/16"   Yes [provider]  Insulin Pen Needle (NOVOFINE PLUS) 32G X 4 MM MISC NovoFine Plus 32 gauge x 1/6" needle  USE 2 TIMES DAILY   Yes [provider]  Insulin Pen Needle (NOVOFINE) 30G X 8 MM MISC NovoFine 30 30 gauge x 1/3" needle   Yes [provider]  Lancet Devices ([employer] LANCING DEVICE) MISC Accu-Chek FastClix Lancing Device   Yes [provider]  metFORMIN (GLUCOPHAGE) 1000 MG tablet Take 1,000 mg by mouth at bedtime. 09/10/17  Yes [provider]  montelukast (SINGULAIR) 10 MG tablet Take 10 mg by mouth at bedtime.   Yes [provider]  pregabalin (LYRICA) 100 MG capsule Take 100 mg by mouth 2 (two) times daily.   Yes [provider]  sitaGLIPtin-metformin (JANUMET) 50-1000 MG tablet Take 1 tablet by mouth every morning.    Yes [provider]  Sulfacetamide Sodium (SODIUM SULFACETAMIDE EX) Apply 1 application topically every 6 (six) hours as needed (for itching).    Yes [provider]  verapamil  (COVERA HS) 180 MG (CO) 24 hr tablet Take 180 mg by mouth 2 (two) times daily.   Yes [provider]  vitamin C (ASCORBIC ACID) 500 MG tablet Take 500 mg by mouth every morning.   Yes [provider]  warfarin (COUMADIN) 10 MG tablet Take 10 mg by mouth See admin instructions. Take 1 tablet by mouth every other day, alternating with 7.5mg  tablet 10/24/16  Yes [provider]  warfarin (COUMADIN) 7.5 MG tablet Take 7.5 mg by mouth See admin instructions. Take 1 tablet by mouth every other day alternating with 10mg  tablet 10/24/16  Yes [provider]  ACCU-CHEK AVIVA PLUS test strip CHECK BLOOD GLUCOSE TWICE A DAY 10/04/17   [provider]  acetaminophen-codeine (TYLENOL #3) 300-30 MG tablet Take 1 tablet by mouth every 8 (  eight) hours as needed for moderate pain. 12/07/17   Magnus Sinning, MD  cyclobenzaprine (FLEXERIL) 10 MG tablet Take 1 tablet (10 mg total) by mouth 2 (two) times daily as needed for muscle spasms. 01/09/17   Gareth Morgan, MD  desonide (DESOWEN) 0.05 % lotion Apply 1 application topically 2 (two) times daily as needed (rash).     [provider]  HYDROcodone-acetaminophen (NORCO/VICODIN) 5-325 MG tablet Take 1 tablet by mouth 4 (four) times daily as needed. 04/22/18   Wynona Luna, MD    Family History Family History  Problem Relation Age of Onset  . Cancer Mother        colon  . Cancer Cousin        lung    Social History Social History   Tobacco Use  . Smoking status: Never Smoker  . Smokeless tobacco: Never Used  Substance Use Topics  . Alcohol use: No  . Drug use: No     Allergies   Patient has no known allergies.   Review of Systems Review of Systems  All other systems reviewed and are negative.    Physical Exam Triage Vital Signs ED Triage Vitals  Enc Vitals Group     BP 04/22/18 1254 (!) 137/54     Pulse Rate 04/22/18 1254 74     Resp 04/22/18 1254 18     Temp 04/22/18 1254 98 F  (36.7 C)     Temp Source 04/22/18 1254 Oral     SpO2 04/22/18 1254 99 %     Weight --      Height --      Pain Score 04/22/18 1255 1     Pain Loc --    Updated Vital Signs BP (!) 137/54 (BP Location: Right Arm)   Pulse 74   Temp 98 F (36.7 C) (Oral)   Resp 18   SpO2 99%   Physical Exam Vitals signs and nursing note reviewed.  Constitutional:      General: She is not in acute distress.    Comments: Alert, nicely groomed  HENT:     Head: Atraumatic.  Eyes:     Comments: Conjugate gaze, no eye redness/drainage  Neck:     Musculoskeletal: Neck supple.     Comments: Good range of motion in the neck, including lateral rotation, lateral flexion, and forward flexion/extension.  Discomfort with all of these movements, particularly with rotation and flexion to the left, and forward flexion.  Cardiovascular:     Rate and Rhythm: Normal rate and regular rhythm.  Pulmonary:     Effort: No respiratory distress.     Breath sounds: No wheezing or rales.     Comments: Lungs clear, symmetric breath sounds  Abdominal:     General: There is no distension.  Musculoskeletal: Normal range of motion.     Comments: No leg swelling Able to raise both arms at the shoulders overhead, quickly and easily, externally and internally rotate.  Little bit of discomfort with internal rotation at the left shoulder.  Skin:    General: Skin is warm and dry.     Comments: No cyanosis  Neurological:     Mental Status: She is alert and oriented to person, place, and time.       Final Clinical Impressions(s) / UC Diagnoses   Final diagnoses:  Neck and shoulder pain     Discharge Instructions     Prescription for a small number (6 tablets) of vicodin, for emergency pain,  were given.  Continue tylenol as you are.  Use ice or heat for 5-10 minutes several times daily to help decrease pain.  Some pages of gentle stretching exercises are attached to this handout, to see if this improves your pain.   Lying down in the same position for prolonged periods may increase pain.  Followup with your primary care provider to discuss next steps for managing pain.    ED Prescriptions    Medication Sig Dispense Auth. Provider   HYDROcodone-acetaminophen (NORCO/VICODIN) 5-325 MG tablet Take 1 tablet by mouth 4 (four) times daily as needed. 6 tablet Wynona Luna, MD        Wynona Luna, MD 04/23/18 [phone]

## 2018-04-29 DIAGNOSIS — Z7901 Long term (current) use of anticoagulants: Secondary | ICD-10-CM | POA: Diagnosis not present

## 2018-05-02 ENCOUNTER — Ambulatory Visit: Payer: Medicare Other | Admitting: Podiatry

## 2018-05-04 ENCOUNTER — Encounter: Payer: Self-pay | Admitting: Podiatry

## 2018-05-04 ENCOUNTER — Ambulatory Visit (INDEPENDENT_AMBULATORY_CARE_PROVIDER_SITE_OTHER): Payer: Medicare Other | Admitting: Podiatry

## 2018-05-04 DIAGNOSIS — M79676 Pain in unspecified toe(s): Secondary | ICD-10-CM | POA: Diagnosis not present

## 2018-05-04 DIAGNOSIS — E0843 Diabetes mellitus due to underlying condition with diabetic autonomic (poly)neuropathy: Secondary | ICD-10-CM

## 2018-05-04 DIAGNOSIS — B351 Tinea unguium: Secondary | ICD-10-CM | POA: Diagnosis not present

## 2018-05-08 NOTE — Progress Notes (Signed)
   SUBJECTIVE Patient with a history of diabetes mellitus presents to office today complaining of elongated, thickened nails that cause pain while ambulating in shoes. She is unable to trim her own nails. Patient is here for further evaluation and treatment.   Past Medical History:  Diagnosis Date  . Clotting disorder ([place])   . Cough 02/08/2017  . Diabetic retinopathy ([place])   . 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 Full dentures   . GERD (gastroesophageal reflux disease)   . Heart murmur   . History of blood clots 1996   groin  . History of glaucoma    had laser correction  . Hypertension    states under control with meds., has been on med. since 1996  . Insulin dependent diabetes mellitus ([place])   . Mitral valve prolapse   . Osteoarthritis    bilateral knee  . Seasonal allergies   . Sleep apnea    no CPAP use in several months, per pt.  . Trigger finger, left middle finger 01/2017    OBJECTIVE General Patient is awake, alert, and oriented x 3 and in no acute distress. Derm Skin is dry and supple bilateral. Negative open lesions or macerations. Remaining integument unremarkable. Nails are tender, long, thickened and dystrophic with subungual debris, consistent with onychomycosis, 1-5 bilateral. No signs of infection noted. Vasc  DP and PT pedal pulses palpable bilaterally. Temperature gradient within normal limits.  Neuro Epicritic and protective threshold sensation diminished bilaterally.  Musculoskeletal Exam No symptomatic pedal deformities noted bilateral. Muscular strength within normal limits.  ASSESSMENT 1. Diabetes Mellitus w/ peripheral neuropathy 2. Onychomycosis of nail due to dermatophyte bilateral 3. Pain in foot bilateral  PLAN OF CARE 1. Patient evaluated today. 2. Instructed to maintain good pedal hygiene and foot care. Stressed importance of controlling blood sugar.  3. Mechanical debridement of nails 1-5 bilaterally performed using a nail  nipper. Filed with dremel without incident.  4. Return to clinic in 3 mos.     Edrick Kins, DPM [facility]  Dr. Edrick Kins, [place]                                        [place], [place] [address]                Office [phone]  Fax [phone]

## 2018-05-19 ENCOUNTER — Other Ambulatory Visit: Payer: Self-pay

## 2018-05-19 ENCOUNTER — Ambulatory Visit: Payer: Medicare Other | Attending: Family Medicine

## 2018-05-19 DIAGNOSIS — R293 Abnormal posture: Secondary | ICD-10-CM

## 2018-05-19 DIAGNOSIS — R252 Cramp and spasm: Secondary | ICD-10-CM

## 2018-05-19 DIAGNOSIS — M542 Cervicalgia: Secondary | ICD-10-CM

## 2018-05-19 NOTE — Therapy (Addendum)
[facility] [address] [place], [place], [address] Phone: [phone]   Fax:  [phone]  Physical Therapy Evaluation  Patient Details  Name: Kaitlyn Jackson MRN: [id number] Date of Birth: March 11, 1946 Referring Provider (PT): Lucianne Lei, MD   Encounter Date: 05/19/2018  PT End of Session - 05/19/18 1300    Visit Number  1    Date for PT Re-Evaluation  06/30/18    Authorization Type  [employer]HC Medicare primary and Medicaid secondary    PT Start Time  1221    PT Stop Time  1302    PT Time Calculation (min)  41 min    Activity Tolerance  Patient tolerated treatment well    Behavior During Therapy  [facility] for tasks assessed/performed       Past Medical History:  Diagnosis Date  . Clotting disorder ([place])   . Cough 02/08/2017  . Diabetic retinopathy ([place])   . 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 Full dentures   . GERD (gastroesophageal reflux disease)   . Heart murmur   . History of blood clots 1996   groin  . History of glaucoma    had laser correction  . Hypertension    states under control with meds., has been on med. since 1996  . Insulin dependent diabetes mellitus ([place])   . Mitral valve prolapse   . Osteoarthritis    bilateral knee  . Seasonal allergies   . Sleep apnea    no CPAP use in several months, per pt.  . Trigger finger, left middle finger 01/2017    Past Surgical History:  Procedure Laterality Date  . BACK SURGERY     lower - removed  cysts  . CATARACT EXTRACTION W/ INTRAOCULAR LENS  IMPLANT, BILATERAL Bilateral   . GLAUCOMA SURGERY Bilateral    laser  . HEMILAMINOTOMY LUMBAR SPINE Right 01/30/2009   L5  . TOTAL KNEE ARTHROPLASTY Right 09/22/2007  . TOTAL KNEE ARTHROPLASTY Left 02/01/2007  . TRIGGER FINGER RELEASE Right 12/14/2013   Procedure: RELEASE TRIGGER FINGER/A-1 PULLEY RIGHT RING FINGER;  Surgeon: Daryll Brod, MD;  Location: [place];  Service:  Orthopedics;  Laterality: Right;  . TRIGGER FINGER RELEASE Left 02/11/2017   Procedure: RELEASE TRIGGER FINGER/A-1 PULLEY;  Surgeon: Leanora Cover, MD;  Location: [place];  Service: Orthopedics;  Laterality: Left;    There were no vitals filed for this visit.   Subjective Assessment - 05/19/18 1225    Subjective  Pt presents to PT with complaints of Lt>Rt sided neck pain that began 6 weeks ago.  Pt feels that she might have "slept wrong" on the neck.  Pain has significantly reduced and pt wants to attend a few sessions to learn exercises for her neck.      Pertinent History  ASCVD, HTN, DM with retinopathy, R DVT in 1996 managed with medication, hx of glaucoma but treated with laser, osteopenia per chart, sleep apnea with CPAP (but pt doesn't use it), OA, discoid lupus erythematosus, B TKAs (R knee 2009 with pt reporting "cement coming loose", L knee 2008), Vitamin D,    Diagnostic tests  none    Patient Stated Goals  reduce neck pain, learn exercises to reduce recurrance of neck pain    Currently in Pain?  No/denies         [facility] PT Assessment - 05/19/18 0001      Assessment   Medical Diagnosis  neck  pain    Referring Provider (PT)  Lucianne Lei, MD    Onset Date/Surgical Date  04/28/18    Hand Dominance  Right    Next MD Visit  06/10/2018    Prior Therapy  none      Precautions   Precautions  Fall      Restrictions   Weight Bearing Restrictions  No      Balance Screen   Has the patient fallen in the past 6 months  Yes    How many times?  1   Pt had PT for balance 2 months ago   Has the patient had a decrease in activity level because of a fear of falling?   No    Is the patient reluctant to leave their home because of a fear of falling?   No      Home Film/video editor residence    Living Arrangements  Spouse/significant other;Children    Type of [place] to enter    Entrance Stairs-Number of Steps   8    Entrance Stairs-Rails  Cannot reach both    [place]  One level      Prior Function   Level of [place]  Retired    Leisure  Spending time with family, go to [employer], sang with gospel group (unable to do these things 2/2 husband's illness, go to the [employer]   Overall Cognitive Status  Within Functional Limits for tasks assessed      Observation/Other Assessments   Focus on Therapeutic Outcomes (FOTO)   33% limitation      Posture/Postural Control   Posture/Postural Control  Postural limitations    Postural Limitations  Forward head   weight shift posterior in chair     ROM / Strength   AROM / PROM / Strength  AROM;Strength      AROM   Overall AROM   Deficits    Overall AROM Comments  UE A/ROM is full    AROM Assessment Site  Cervical    Cervical Flexion  --   full   Cervical Extension  25     Cervical - Right Side Bend  35    Cervical - Left Side Bend  35    Cervical - Right Rotation  75    Cervical - Left Rotation  80      Strength   Overall Strength  Within functional limits for tasks performed    Overall Strength Comments  4+/5 UE strength      Palpation   Spinal mobility  reduced PA mobility C3-7 without pain    Palpation comment  signification trigger points over bil upper traps.  No tenderness over bil cervical paraspinals      Transfers   Transfers  Sit to Stand;Stand to Sit    Sit to Stand  5: Supervision;Without upper extremity assist;From chair/3-in-1                Objective measurements completed on examination: See above findings.              PT Education - 05/19/18 1250    Education Details  Access Code: [email or web address]    Person(s) Educated  Patient    Methods  Explanation;Demonstration;Handout    Comprehension  Returned demonstration;Verbalized understanding       PT Short Term Goals - 05/19/18  [place] #1   Title  be independent in initial HEP    Time   2    Period  Weeks    Status  New    Target Date  06/02/18      PT SHORT TERM GOAL #2   Title  --      PT SHORT TERM GOAL #3   Title  --      PT SHORT TERM GOAL #4   Title  --      PT SHORT TERM GOAL #5   Title  --      PT SHORT TERM GOAL #6   Title  --      PT SHORT TERM GOAL #7   Title  --        PT Long Term Goals - 05/19/18 1307      PT LONG TERM GOAL #1   Title  be independent in advanced HEP for postrual strength and cervical flexibility    Time  6    Period  Weeks    Status  New    Target Date  06/30/18      PT LONG TERM GOAL #2   Title  verbalize and demonstrate correct posture for sitting and report postural corrections frequently at home    Time  6    Period  Weeks    Status  New    Target Date  06/30/18      PT LONG TERM GOAL #3   Title  demonstrate > or = to 45 degrees of cervcial sidebending to improve mobility     Time  6    Period  Weeks    Status  New    Target Date  06/30/18      PT LONG TERM GOAL #4   Title  reduce FOTO to < or = to 30% limitation    Time  6    Period  Weeks    Status  New    Target Date  06/30/18      PT LONG TERM GOAL #5   Title  --             Plan - 05/19/18 1319    Clinical Impression Statement  Pt presents to PT with neck pain that began ~6 months ago.  Pt woke up with neck pain and it was severe and she had to go to the ED.  Pain has now almost completely resolved.  Pt would like to attend a few sessions of PT due to tight neck muscles and wants to learn exercises to help reduce the recurrence of a flare-up.  Pt demonstrates limited cervical A/ROM, scapular elevation with all UE and cervical A/ROM and trigger points in bil upper traps.  Pt sits with poor seated posture. Pt will benefit from skilled PT to address postural dysfunction, improve cervical flexibility and reduce muscle tension.      Personal Factors and Comorbidities  Social Background;Comorbidity 1    Comorbidities  lupus, HTN     Stability/Clinical Decision Making  Stable/Uncomplicated    Clinical Decision Making  Low    Rehab Potential  Good    PT Frequency  1x / week    PT Duration  6 weeks   2-3 sessions likely   PT Treatment/Interventions  ADLs/Self Care Home Management;Cryotherapy;Electrical Stimulation;Moist Heat;Therapeutic activities;Therapeutic exercise;Neuromuscular re-education;Manual techniques;Taping;Dry needling;Passive range of motion    PT Next Visit Plan  dry needling to  bil upper traps, review HEP, work on posture and add postural strength.  2-3 more session probable    PT Home Exercise Plan  Access Code: [email or web address]    Consulted and Agree with Plan of Care  Patient       Patient will benefit from skilled therapeutic intervention in order to improve the following deficits and impairments:  Impaired flexibility, Postural dysfunction, Increased muscle spasms, Improper body mechanics  Visit Diagnosis: Cervicalgia - Plan: PT plan of care cert/re-cert  Cramp and spasm - Plan: PT plan of care cert/re-cert  Abnormal posture - Plan: PT plan of care cert/re-cert     Problem List Patient Active Problem List   Diagnosis Date Noted  . Sleep apnea 01/31/2018  . Atrophic vaginitis 01/31/2018  . Hyponatremia 01/31/2018  . Increased body mass index 01/31/2018  . Osteoarthritis 01/31/2018  . Postmenopausal bleeding 01/31/2018  . Systemic lupus erythematosus ([place]) 01/31/2018  . Uterine leiomyoma 01/31/2018  . Trigger middle finger of left hand 01/11/2017  . 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 High risk medication use 01/06/2016  . 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 Vitamin D deficiency 01/06/2016  . Deep venous thrombosis of lower extremity ([place]) 06/04/2015  . Osteopenia 03/14/2012  . Diabetes mellitus type 2 in obese ([place]) 03/14/2012  . 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 Hypertension 03/14/2012    Sigurd Sos, PT 05/19/18 1:25 PM PHYSICAL THERAPY DISCHARGE SUMMARY  Visits from Start of Care: 1  Current functional level related to goals / functional outcomes: See above for current status.  Pt was placed on hold during COVID and PT was in contact with pt during this time.  Pt is still not ready to come out yet and will be discharged at this time.     Remaining deficits: See above for current PT status.     Education / Equipment: HEP Plan: Patient agrees to discharge.  Patient goals were not met. Patient is being discharged due to the patient's request.  ?????        Sigurd Sos, PT 08/03/18 8:46 AM  [facility] [address], [place], [place], [address] Phone: [phone]   Fax:  [phone]  Name: Kaitlyn Jackson MRN: [id number] Date of Birth: November 07, 1945

## 2018-05-19 NOTE — Patient Instructions (Signed)
Access Code: [email or web address]  URL: [email or web address]  Date: 05/19/2018  Prepared by: Sigurd Sos   Exercises Seated Cervical Flexion AROM - 3 reps - 20 hold - 3x daily - 7x weekly Seated Cervical Sidebending AROM - 3 reps - 1 sets - 20 hold - 3x daily - 7x weekly Seated Cervical Rotation AROM - 3 reps - 1 sets - 20 hold - 3x daily - 7x weekly Seated Correct Posture - 10 reps - 3 sets - 1x daily - 7x weekly

## 2018-05-30 ENCOUNTER — Other Ambulatory Visit: Payer: Self-pay

## 2018-05-30 ENCOUNTER — Encounter (INDEPENDENT_AMBULATORY_CARE_PROVIDER_SITE_OTHER): Payer: Medicare Other | Admitting: Ophthalmology

## 2018-05-30 DIAGNOSIS — H35033 Hypertensive retinopathy, bilateral: Secondary | ICD-10-CM

## 2018-05-30 DIAGNOSIS — E11319 Type 2 diabetes mellitus with unspecified diabetic retinopathy without macular edema: Secondary | ICD-10-CM

## 2018-05-30 DIAGNOSIS — E113293 Type 2 diabetes mellitus with mild nonproliferative diabetic retinopathy without macular edema, bilateral: Secondary | ICD-10-CM | POA: Diagnosis not present

## 2018-05-30 DIAGNOSIS — H33302 Unspecified retinal break, left eye: Secondary | ICD-10-CM | POA: Diagnosis not present

## 2018-05-30 DIAGNOSIS — I10 Essential (primary) hypertension: Secondary | ICD-10-CM

## 2018-05-30 DIAGNOSIS — H43813 Vitreous degeneration, bilateral: Secondary | ICD-10-CM

## 2018-05-31 DIAGNOSIS — H35033 Hypertensive retinopathy, bilateral: Secondary | ICD-10-CM | POA: Diagnosis not present

## 2018-05-31 DIAGNOSIS — E113293 Type 2 diabetes mellitus with mild nonproliferative diabetic retinopathy without macular edema, bilateral: Secondary | ICD-10-CM | POA: Diagnosis not present

## 2018-05-31 DIAGNOSIS — H35373 Puckering of macula, bilateral: Secondary | ICD-10-CM | POA: Diagnosis not present

## 2018-05-31 DIAGNOSIS — H401231 Low-tension glaucoma, bilateral, mild stage: Secondary | ICD-10-CM | POA: Diagnosis not present

## 2018-06-05 ENCOUNTER — Ambulatory Visit (HOSPITAL_COMMUNITY)
Admission: EM | Admit: 2018-06-05 | Discharge: 2018-06-05 | Disposition: A | Discharge status: Home / Self Care | Payer: Medicare Other | Attending: Internal Medicine | Admitting: Internal Medicine

## 2018-06-05 ENCOUNTER — Encounter (HOSPITAL_COMMUNITY): Payer: Self-pay | Admitting: *Deleted

## 2018-06-05 ENCOUNTER — Other Ambulatory Visit: Payer: Self-pay

## 2018-06-05 ENCOUNTER — Ambulatory Visit (INDEPENDENT_AMBULATORY_CARE_PROVIDER_SITE_OTHER): Payer: Medicare Other

## 2018-06-05 DIAGNOSIS — W19XXXA Unspecified fall, initial encounter: Secondary | ICD-10-CM

## 2018-06-05 DIAGNOSIS — S6992XA Unspecified injury of left wrist, hand and finger(s), initial encounter: Secondary | ICD-10-CM

## 2018-06-05 DIAGNOSIS — S63502A Unspecified sprain of left wrist, initial encounter: Secondary | ICD-10-CM | POA: Diagnosis not present

## 2018-06-05 DIAGNOSIS — M19032 Primary osteoarthritis, left wrist: Secondary | ICD-10-CM | POA: Diagnosis not present

## 2018-06-05 MED ORDER — DICLOFENAC SODIUM 1 % TD GEL: 2.0000 g | Freq: Four times a day (QID) | TRANSDERMAL | 0 refills | Status: DC

## 2018-06-05 MED ORDER — ACETAMINOPHEN-CODEINE #3 300-30 MG PO TABS: 1.0000 | ORAL_TABLET | Freq: Three times a day (TID) | ORAL | 0 refills | Status: DC | PRN

## 2018-06-05 NOTE — ED Triage Notes (Signed)
Reports falling backwards when a non-leased dog jumped onto her legs 2 days ago.  C/O continued left wrist pain.  Denies hitting head.  CMS intact.  Has been wearing wrist splint.

## 2018-06-05 NOTE — ED Provider Notes (Signed)
[facility]    CSN: [id number] Arrival date & time: 06/05/18  1214     History   Chief Complaint Chief Complaint  Patient presents with  . Fall  . Wrist Injury    HPI Kaitlyn Jackson is a 73 y.o. female.   Pt is a 73 year old female that presents with left wrist pain and swelling.  This started approximately 2 days ago after a fall.  Symptoms have been constant and remain the same.  She has been taking Tylenol without any relief of the pain.  She is also been using ice for swelling and inflammation.  Using a splint she had at home to cushion the wrist.  Denies any associated numbness, tingling or loss of sensation.  ROS per HPI      Past Medical History:  Diagnosis Date  . Clotting disorder ([place])   . Cough 02/08/2017  . Diabetic retinopathy ([place])   . 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 Full dentures   . GERD (gastroesophageal reflux disease)   . Heart murmur   . History of blood clots 1996   groin  . History of glaucoma    had laser correction  . Hypertension    states under control with meds., has been on med. since 1996  . Insulin dependent diabetes mellitus ([place])   . Mitral valve prolapse   . Osteoarthritis    bilateral knee  . Seasonal allergies   . Sleep apnea    no CPAP use in several months, per pt.  . Trigger finger, left middle finger 01/2017    Patient Active Problem List   Diagnosis Date Noted  . Sleep apnea 01/31/2018  . Atrophic vaginitis 01/31/2018  . Hyponatremia 01/31/2018  . Increased body mass index 01/31/2018  . Osteoarthritis 01/31/2018  . Postmenopausal bleeding 01/31/2018  . Systemic lupus erythematosus ([place]) 01/31/2018  . Uterine leiomyoma 01/31/2018  . Trigger middle finger of left hand 01/11/2017  . 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 High risk medication use 01/06/2016  . 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 Vitamin D  deficiency 01/06/2016  . Deep venous thrombosis of lower extremity ([place]) 06/04/2015  . Osteopenia 03/14/2012  . Diabetes mellitus type 2 in obese ([place]) 03/14/2012  . 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 Hypertension 03/14/2012    Past Surgical History:  Procedure Laterality Date  . BACK SURGERY     lower - removed  cysts  . CATARACT EXTRACTION W/ INTRAOCULAR LENS  IMPLANT, BILATERAL Bilateral   . GLAUCOMA SURGERY Bilateral    laser  . HEMILAMINOTOMY LUMBAR SPINE Right 01/30/2009   L5  . JOINT REPLACEMENT    . TOTAL KNEE ARTHROPLASTY Right 09/22/2007  . TOTAL KNEE ARTHROPLASTY Left 02/01/2007  . TRIGGER FINGER RELEASE Right 12/14/2013   Procedure: RELEASE TRIGGER FINGER/A-1 PULLEY RIGHT RING FINGER;  Surgeon: Daryll Brod, MD;  Location: [facility];  Service: Orthopedics;  Laterality: Right;  . TRIGGER FINGER RELEASE Left 02/11/2017   Procedure: RELEASE TRIGGER FINGER/A-1 PULLEY;  Surgeon: Leanora Cover, MD;  Location: [facility];  Service: Orthopedics;  Laterality: Left;    OB History   No obstetric history on file.      Home Medications    Prior to Admission medications   Medication Sig Start Date End Date Taking? Authorizing Provider  atorvastatin (LIPITOR) 20 MG tablet Take 10 mg by mouth at bedtime.  Yes [provider]  benazepril (LOTENSIN) 40 MG tablet Take 40 mg by mouth every morning.    Yes [provider]  cholecalciferol (VITAMIN D) 1000 UNITS tablet Take 1,000 Units by mouth at bedtime.    Yes [provider]  esomeprazole (NEXIUM) 40 MG capsule Take 40 mg by mouth daily before breakfast.   Yes [provider]  fexofenadine (ALLEGRA) 180 MG tablet Take 180 mg by mouth daily as needed for allergies.    Yes [provider]  fluticasone (FLONASE) 50 MCG/ACT nasal spray Place 1 spray into both nostrils daily as needed for allergies.    Yes [provider]  folic  acid (FOLVITE) 1 MG tablet Take 1 mg by mouth daily.   Yes [provider]  furosemide (LASIX) 40 MG tablet Take 40 mg by mouth daily.   Yes [provider]  metFORMIN (GLUCOPHAGE) 1000 MG tablet Take 1,000 mg by mouth at bedtime. 09/10/17  Yes [provider]  montelukast (SINGULAIR) 10 MG tablet Take 10 mg by mouth at bedtime.   Yes [provider]  pregabalin (LYRICA) 100 MG capsule Take 100 mg by mouth 2 (two) times daily.   Yes [provider]  sitaGLIPtin-metformin (JANUMET) 50-1000 MG tablet Take 1 tablet by mouth every morning.    Yes [provider]  verapamil (COVERA HS) 180 MG (CO) 24 hr tablet Take 180 mg by mouth 2 (two) times daily.   Yes [provider]  warfarin (COUMADIN) 10 MG tablet Take 10 mg by mouth See admin instructions. Take 1 tablet by mouth every other day, alternating with 7.5mg  tablet 10/24/16  Yes [provider]  warfarin (COUMADIN) 7.5 MG tablet Take 7.5 mg by mouth See admin instructions. Take 1 tablet by mouth every other day alternating with 10mg  tablet 10/24/16  Yes [provider]  ACCU-CHEK AVIVA PLUS test strip CHECK BLOOD GLUCOSE TWICE A DAY 10/04/17   [provider]  acetaminophen (TYLENOL) 500 MG tablet Take 500-1,000 mg by mouth every 6 (six) hours as needed for mild pain.    [provider]  acetaminophen-codeine (TYLENOL #3) 300-30 MG tablet Take 1 tablet by mouth every 8 (eight) hours as needed for moderate pain. 06/05/18   Britaney Espaillat, Tressia Miners A, NP  azelastine (ASTELIN) 0.1 % nasal spray PLACE 2 SPRAYS INTO BOTH NOSTRILS 2 (TWO) TIMES DAILY. 02/22/18   Kennith Gain, MD  Blood Glucose Monitoring Suppl (GLUCOCOM BLOOD GLUCOSE MONITOR) DEVI Accu-Chek Aviva Plus Meter    [provider]  cyclobenzaprine (FLEXERIL) 10 MG tablet Take 1 tablet (10 mg total) by mouth 2 (two) times daily as needed for muscle spasms. 01/09/17   Gareth Morgan, MD  desonide  (DESOWEN) 0.05 % lotion Apply 1 application topically 2 (two) times daily as needed (rash).     [provider]  diclofenac sodium (VOLTAREN) 1 % GEL Apply 2 g topically 4 (four) times daily. 06/05/18   Loura Halt A, NP  HYDROcodone-acetaminophen (NORCO/VICODIN) 5-325 MG tablet Take 1 tablet by mouth 4 (four) times daily as needed. 04/22/18   Wynona Luna, MD  insulin NPH-insulin regular (NOVOLIN 70/30) (70-30) 100 UNIT/ML injection Inject 4-5 Units into the skin See admin instructions. Check blood sugar and take 4 to 5 units depending on blood sugar.    [provider]  Insulin Pen Needle (FIFTY50 PEN NEEDLES) 31G X 8 MM MISC BD Ultra-Fine Short Pen Needle 31 gauge x 5/16"    [provider]  Insulin Pen Needle (NOVOFINE PLUS) 32G X 4 MM MISC NovoFine Plus 32 gauge x 1/6" needle  USE 2 TIMES DAILY    [provider]  Insulin Pen Needle (NOVOFINE) 30G X 8 MM MISC NovoFine 30 30 gauge x 1/3" needle    [provider]  Lancet Devices ([employer] LANCING DEVICE) MISC Accu-Chek FastClix Lancing Device    [provider]  Sulfacetamide Sodium (SODIUM SULFACETAMIDE EX) Apply 1 application topically every 6 (six) hours as needed (for itching).     [provider]  vitamin C (ASCORBIC ACID) 500 MG tablet Take 500 mg by mouth every morning.    [provider]    Family History Family History  Problem Relation Age of Onset  . Cancer Mother        colon  . Cancer Cousin        lung    Social History Social History   Tobacco Use  . Smoking status: Never Smoker  . Smokeless tobacco: Never Used  Substance Use Topics  . Alcohol use: No  . Drug use: No     Allergies   Patient has no known allergies.   Review of Systems Review of Systems   Physical Exam Triage Vital Signs ED Triage Vitals  Enc Vitals Group     BP 06/05/18 1225 (!) 152/74     Pulse Rate 06/05/18 1225 77     Resp 06/05/18 1225 16     Temp 06/05/18  1225 98.1 F (36.7 C)     Temp Source 06/05/18 1225 Oral     SpO2 06/05/18 1225 99 %     Weight --      Height --      Head Circumference --      Peak Flow --      Pain Score 06/05/18 1231 9     Pain Loc --      Pain Edu? --      Excl. in [place]? --    No data found.  Updated Vital Signs BP (!) 152/74 (BP Location: Right Arm)   Pulse 77   Temp 98.1 F (36.7 C) (Oral)   Resp 16   SpO2 99%   Visual Acuity Right Eye Distance:   Left Eye Distance:   Bilateral Distance:    Right Eye Near:   Left Eye Near:    Bilateral Near:     Physical Exam Vitals signs and nursing note reviewed.  Constitutional:      General: She is not in acute distress.    Appearance: Normal appearance. She is not ill-appearing, toxic-appearing or diaphoretic.  HENT:     Head: Normocephalic and atraumatic.     Nose: Nose normal.  Eyes:     Conjunctiva/sclera: Conjunctivae normal.  Neck:     Musculoskeletal: Normal range of motion.  Pulmonary:     Effort: Pulmonary effort is normal.  Musculoskeletal:        General: Swelling, tenderness and signs of injury present. No deformity.     Comments: Limited flexion-extension of the wrist.  Swelling mostly over the distal ulna.  Good radial pulse.  Sensation and temperature normal.  She is able to wiggle fingers.  Skin:    General: Skin is warm and dry.     Capillary Refill: Capillary refill takes less than 2 seconds.  Neurological:     Mental Status: She is alert.  Psychiatric:        Mood and Affect: Mood normal.  UC Treatments / Results  Labs (all labs ordered are listed, but only abnormal results are displayed) Labs Reviewed - No data to display  EKG None  Radiology Dg Wrist Complete Left  Result Date: 06/05/2018 CLINICAL DATA:  Left wrist pain since an injury in a fall 2 days ago. Initial encounter. EXAM: LEFT WRIST - COMPLETE 3+ VIEW COMPARISON:  Plain films left hand 01/20/2018. FINDINGS: Soft tissues about the wrist are swollen. No  fracture or dislocation. Mild to moderate first CMC osteoarthritis is noted. Cystic change in the lunate eccentric toward the ulnar side is seen as on the prior exam. Chondrocalcinosis is present. IMPRESSION: Soft tissue swelling without underlying acute bony or joint abnormality. Chondrocalcinosis. Mild to moderate first CMC osteoarthritis. Findings compatible with ulnocarpal abutment. Electronically Signed   By: Inge Rise M.D.   On: 06/05/2018 13:04    Procedures Procedures (including critical care time)  Medications Ordered in UC Medications - No data to display  Initial Impression / Assessment and Plan / UC Course  I have reviewed the triage vital signs and the nursing notes.  Pertinent labs & imaging results that were available during my care of the patient were reviewed by me and considered in my medical decision making (see chart for details).     X-ray negative for any bony abnormalities Most likely bad sprain Wrist splint applied here in clinic Sending prescription for Voltaren gel and Tylenol 3 to the pharmacy [facility] Follow up as needed for continued or worsening symptoms  Final Clinical Impressions(s) / UC Diagnoses   Final diagnoses:  Sprain of left wrist, initial encounter     Discharge Instructions     Your x-ray did not show any fractures Wrist splint given here in clinic Voltaren gel for pain and inflammation Tylenol 3 for more severe pain Rest, ice, elevate Follow up as needed for continued or worsening symptoms     ED Prescriptions    Medication Sig Dispense Auth. Provider   diclofenac sodium (VOLTAREN) 1 % GEL Apply 2 g topically 4 (four) times daily. 1 Tube Jerriah Ines A, NP   acetaminophen-codeine (TYLENOL #3) 300-30 MG tablet Take 1 tablet by mouth every 8 (eight) hours as needed for moderate pain. 15 tablet Loura Halt A, NP     Controlled Substance Prescriptions [place] Controlled Substance Registry consulted? Not Applicable   Orvan July,  NP 06/05/18 1327

## 2018-06-05 NOTE — Discharge Instructions (Addendum)
Your x-ray did not show any fractures Wrist splint given here in clinic Voltaren gel for pain and inflammation Tylenol 3 for more severe pain Rest, ice, elevate Follow up as needed for continued or worsening symptoms

## 2018-06-10 DIAGNOSIS — I82409 Acute embolism and thrombosis of unspecified deep veins of unspecified lower extremity: Secondary | ICD-10-CM | POA: Diagnosis not present

## 2018-06-10 DIAGNOSIS — I10 Essential (primary) hypertension: Secondary | ICD-10-CM | POA: Diagnosis not present

## 2018-06-10 DIAGNOSIS — E1169 Type 2 diabetes mellitus with other specified complication: Secondary | ICD-10-CM | POA: Diagnosis not present

## 2018-06-10 DIAGNOSIS — S63502A Unspecified sprain of left wrist, initial encounter: Secondary | ICD-10-CM | POA: Diagnosis not present

## 2018-06-20 DIAGNOSIS — Z7901 Long term (current) use of anticoagulants: Secondary | ICD-10-CM | POA: Diagnosis not present

## 2018-06-20 DIAGNOSIS — I82409 Acute embolism and thrombosis of unspecified deep veins of unspecified lower extremity: Secondary | ICD-10-CM | POA: Diagnosis not present

## 2018-06-20 DIAGNOSIS — I10 Essential (primary) hypertension: Secondary | ICD-10-CM | POA: Diagnosis not present

## 2018-06-20 DIAGNOSIS — E1169 Type 2 diabetes mellitus with other specified complication: Secondary | ICD-10-CM | POA: Diagnosis not present

## 2018-06-23 ENCOUNTER — Telehealth: Payer: Self-pay

## 2018-06-23 DIAGNOSIS — N950 Postmenopausal bleeding: Secondary | ICD-10-CM | POA: Diagnosis not present

## 2018-06-23 NOTE — Telephone Encounter (Signed)
PT called pt and left voicemail to check in due to clinic closure as result of [place].  Pt does not have appointments at this time and PT will attempt to call again upon clinic opening.

## 2018-08-03 ENCOUNTER — Telehealth: Payer: Self-pay

## 2018-08-03 NOTE — Telephone Encounter (Signed)
PT called pt to check in.  Pt is not ready to come out yet and said she would get new order from MD if she needed to return to PT.

## 2018-08-10 ENCOUNTER — Ambulatory Visit (INDEPENDENT_AMBULATORY_CARE_PROVIDER_SITE_OTHER): Payer: Medicare Other | Admitting: Podiatry

## 2018-08-10 ENCOUNTER — Encounter: Payer: Self-pay | Admitting: Podiatry

## 2018-08-10 ENCOUNTER — Other Ambulatory Visit: Payer: Self-pay

## 2018-08-10 VITALS — Temp 97.5°F

## 2018-08-10 DIAGNOSIS — M79676 Pain in unspecified toe(s): Secondary | ICD-10-CM | POA: Diagnosis not present

## 2018-08-10 DIAGNOSIS — B351 Tinea unguium: Secondary | ICD-10-CM

## 2018-08-10 DIAGNOSIS — E0843 Diabetes mellitus due to underlying condition with diabetic autonomic (poly)neuropathy: Secondary | ICD-10-CM

## 2018-08-10 DIAGNOSIS — L989 Disorder of the skin and subcutaneous tissue, unspecified: Secondary | ICD-10-CM | POA: Diagnosis not present

## 2018-08-14 NOTE — Progress Notes (Signed)
    Subjective: Patient is a 73 y.o. female presenting to the office today with a chief complaint of painful callus lesions noted to the bilateral feet that have been present for the past several months. Walking and bearing weight increases the pain. She has not done anything for treatment at home.  Patient also complains of elongated, thickened nails that cause pain while ambulating in shoes. She is unable to trim her own nails. Patient presents today for further treatment and evaluation.  Past Medical History:  Diagnosis Date  . Clotting disorder ([place])   . Cough 02/08/2017  . Diabetic retinopathy ([place])   . 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 Full dentures   . GERD (gastroesophageal reflux disease)   . Heart murmur   . History of blood clots 1996   groin  . History of glaucoma    had laser correction  . Hypertension    states under control with meds., has been on med. since 1996  . Insulin dependent diabetes mellitus ([place])   . Mitral valve prolapse   . Osteoarthritis    bilateral knee  . Seasonal allergies   . Sleep apnea    no CPAP use in several months, per pt.  . Trigger finger, left middle finger 01/2017    Objective:  Physical Exam General: Alert and oriented x3 in no acute distress  Dermatology: Hyperkeratotic lesions present on the bilateral feet x 2. Pain on palpation with a central nucleated core noted. Skin is warm, dry and supple bilateral lower extremities. Negative for open lesions or macerations. Nails are tender, long, thickened and dystrophic with subungual debris, consistent with onychomycosis, 1-5 bilateral. No signs of infection noted.  Vascular: Palpable pedal pulses bilaterally. No edema or erythema noted. Capillary refill within normal limits.  Neurological: Epicritic and protective threshold diminished bilaterally.   Musculoskeletal Exam: Pain on palpation at the keratotic lesion noted. Range of motion within normal limits bilateral.  Muscle strength 5/5 in all groups bilateral.  Assessment: 1. Onychodystrophic nails 1-5 bilateral with hyperkeratosis of nails.  2. Onychomycosis of nail due to dermatophyte bilateral 3. Pre-ulcerative callus lesions noted to the bilateral feet x 2   Plan of Care:  1. Patient evaluated. 2. Excisional debridement of keratoic lesion using a chisel blade was performed without incident.  3. Dressed with light dressing. 4. Mechanical debridement of nails 1-5 bilaterally performed using a nail nipper. Filed with dremel without incident.  5. Patient is to return to the clinic in 3 months.   Edrick Kins, DPM [facility]  Dr. Edrick Kins, [place]                                        [place], [place] [address]                Office [phone]  Fax [phone]

## 2018-09-21 ENCOUNTER — Ambulatory Visit (HOSPITAL_COMMUNITY)
Admission: RE | Admit: 2018-09-21 | Discharge: 2018-09-21 | Disposition: A | Discharge status: Home / Self Care | Payer: Medicare Other | Source: Ambulatory Visit | Attending: Family Medicine | Admitting: Family Medicine

## 2018-09-21 ENCOUNTER — Other Ambulatory Visit: Payer: Self-pay

## 2018-09-21 ENCOUNTER — Other Ambulatory Visit (HOSPITAL_COMMUNITY): Payer: Self-pay | Admitting: Family Medicine

## 2018-09-21 DIAGNOSIS — I509 Heart failure, unspecified: Secondary | ICD-10-CM | POA: Diagnosis not present

## 2018-09-21 DIAGNOSIS — E1169 Type 2 diabetes mellitus with other specified complication: Secondary | ICD-10-CM | POA: Diagnosis not present

## 2018-09-21 DIAGNOSIS — I82409 Acute embolism and thrombosis of unspecified deep veins of unspecified lower extremity: Secondary | ICD-10-CM | POA: Diagnosis not present

## 2018-09-21 DIAGNOSIS — M79605 Pain in left leg: Secondary | ICD-10-CM | POA: Diagnosis not present

## 2018-09-21 DIAGNOSIS — M7989 Other specified soft tissue disorders: Secondary | ICD-10-CM

## 2018-09-21 DIAGNOSIS — I10 Essential (primary) hypertension: Secondary | ICD-10-CM | POA: Diagnosis not present

## 2018-09-21 DIAGNOSIS — E034 Atrophy of thyroid (acquired): Secondary | ICD-10-CM | POA: Diagnosis not present

## 2018-09-21 NOTE — Progress Notes (Signed)
Left lower extremity venous duplex has been completed. Preliminary results can be found in CV Proc through chart review.  Results were given to [facility] at Dr. Fransico Setters office.  09/21/18 4:38 PM Kaitlyn Jackson RVT

## 2018-09-23 ENCOUNTER — Ambulatory Visit
Admission: RE | Admit: 2018-09-23 | Discharge: 2018-09-23 | Disposition: A | Discharge status: Home / Self Care | Payer: Medicare Other | Source: Ambulatory Visit | Attending: Family Medicine | Admitting: Family Medicine

## 2018-09-23 ENCOUNTER — Other Ambulatory Visit: Payer: Self-pay | Admitting: Family Medicine

## 2018-09-23 DIAGNOSIS — I82409 Acute embolism and thrombosis of unspecified deep veins of unspecified lower extremity: Secondary | ICD-10-CM | POA: Diagnosis not present

## 2018-09-23 DIAGNOSIS — M79672 Pain in left foot: Secondary | ICD-10-CM | POA: Diagnosis not present

## 2018-09-23 DIAGNOSIS — R609 Edema, unspecified: Secondary | ICD-10-CM

## 2018-09-23 DIAGNOSIS — M7989 Other specified soft tissue disorders: Secondary | ICD-10-CM | POA: Diagnosis not present

## 2018-10-03 ENCOUNTER — Other Ambulatory Visit: Payer: Self-pay

## 2018-10-03 ENCOUNTER — Encounter (INDEPENDENT_AMBULATORY_CARE_PROVIDER_SITE_OTHER): Payer: Medicare Other | Admitting: Ophthalmology

## 2018-10-03 DIAGNOSIS — H43813 Vitreous degeneration, bilateral: Secondary | ICD-10-CM

## 2018-10-03 DIAGNOSIS — H33302 Unspecified retinal break, left eye: Secondary | ICD-10-CM

## 2018-10-03 DIAGNOSIS — E11311 Type 2 diabetes mellitus with unspecified diabetic retinopathy with macular edema: Secondary | ICD-10-CM

## 2018-10-03 DIAGNOSIS — E113311 Type 2 diabetes mellitus with moderate nonproliferative diabetic retinopathy with macular edema, right eye: Secondary | ICD-10-CM | POA: Diagnosis not present

## 2018-10-03 DIAGNOSIS — E113392 Type 2 diabetes mellitus with moderate nonproliferative diabetic retinopathy without macular edema, left eye: Secondary | ICD-10-CM

## 2018-10-03 DIAGNOSIS — I10 Essential (primary) hypertension: Secondary | ICD-10-CM | POA: Diagnosis not present

## 2018-10-03 DIAGNOSIS — H35033 Hypertensive retinopathy, bilateral: Secondary | ICD-10-CM | POA: Diagnosis not present

## 2018-10-17 DIAGNOSIS — I10 Essential (primary) hypertension: Secondary | ICD-10-CM | POA: Diagnosis not present

## 2018-10-17 DIAGNOSIS — E1169 Type 2 diabetes mellitus with other specified complication: Secondary | ICD-10-CM | POA: Diagnosis not present

## 2018-10-17 DIAGNOSIS — M130 Polyarthritis, unspecified: Secondary | ICD-10-CM | POA: Diagnosis not present

## 2018-10-17 DIAGNOSIS — Z7901 Long term (current) use of anticoagulants: Secondary | ICD-10-CM | POA: Diagnosis not present

## 2018-10-31 IMAGING — CR DG CHEST 2V
2 series · 2 of 2 positions shown · non-contrast
Comparison: Chest radiograph performed 05/21/2011

CLINICAL DATA: Acute onset of generalized chest pain, radiating to
the back and shoulders. Initial encounter.

EXAM:
CHEST  2 VIEW

[chest pa]
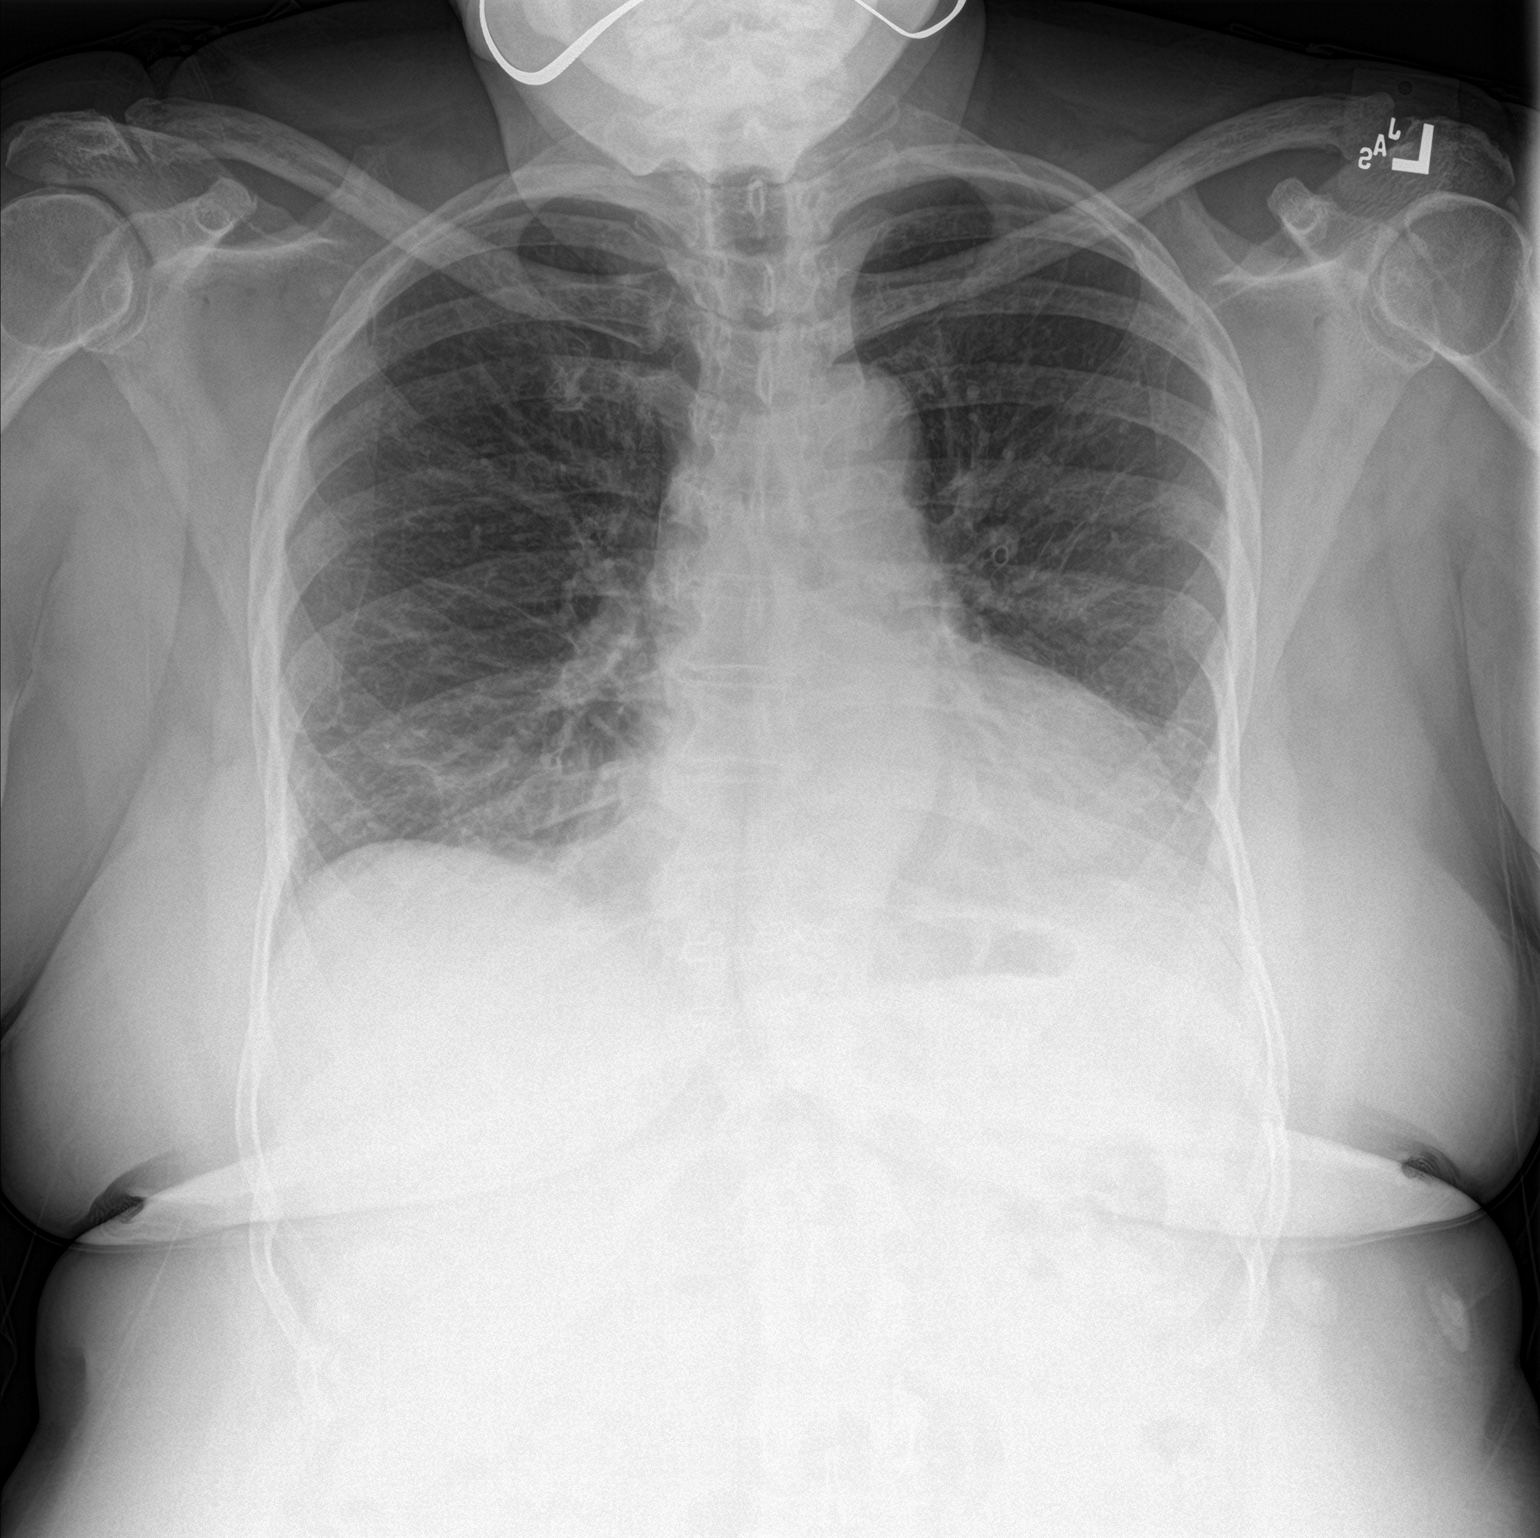

[chest lat]
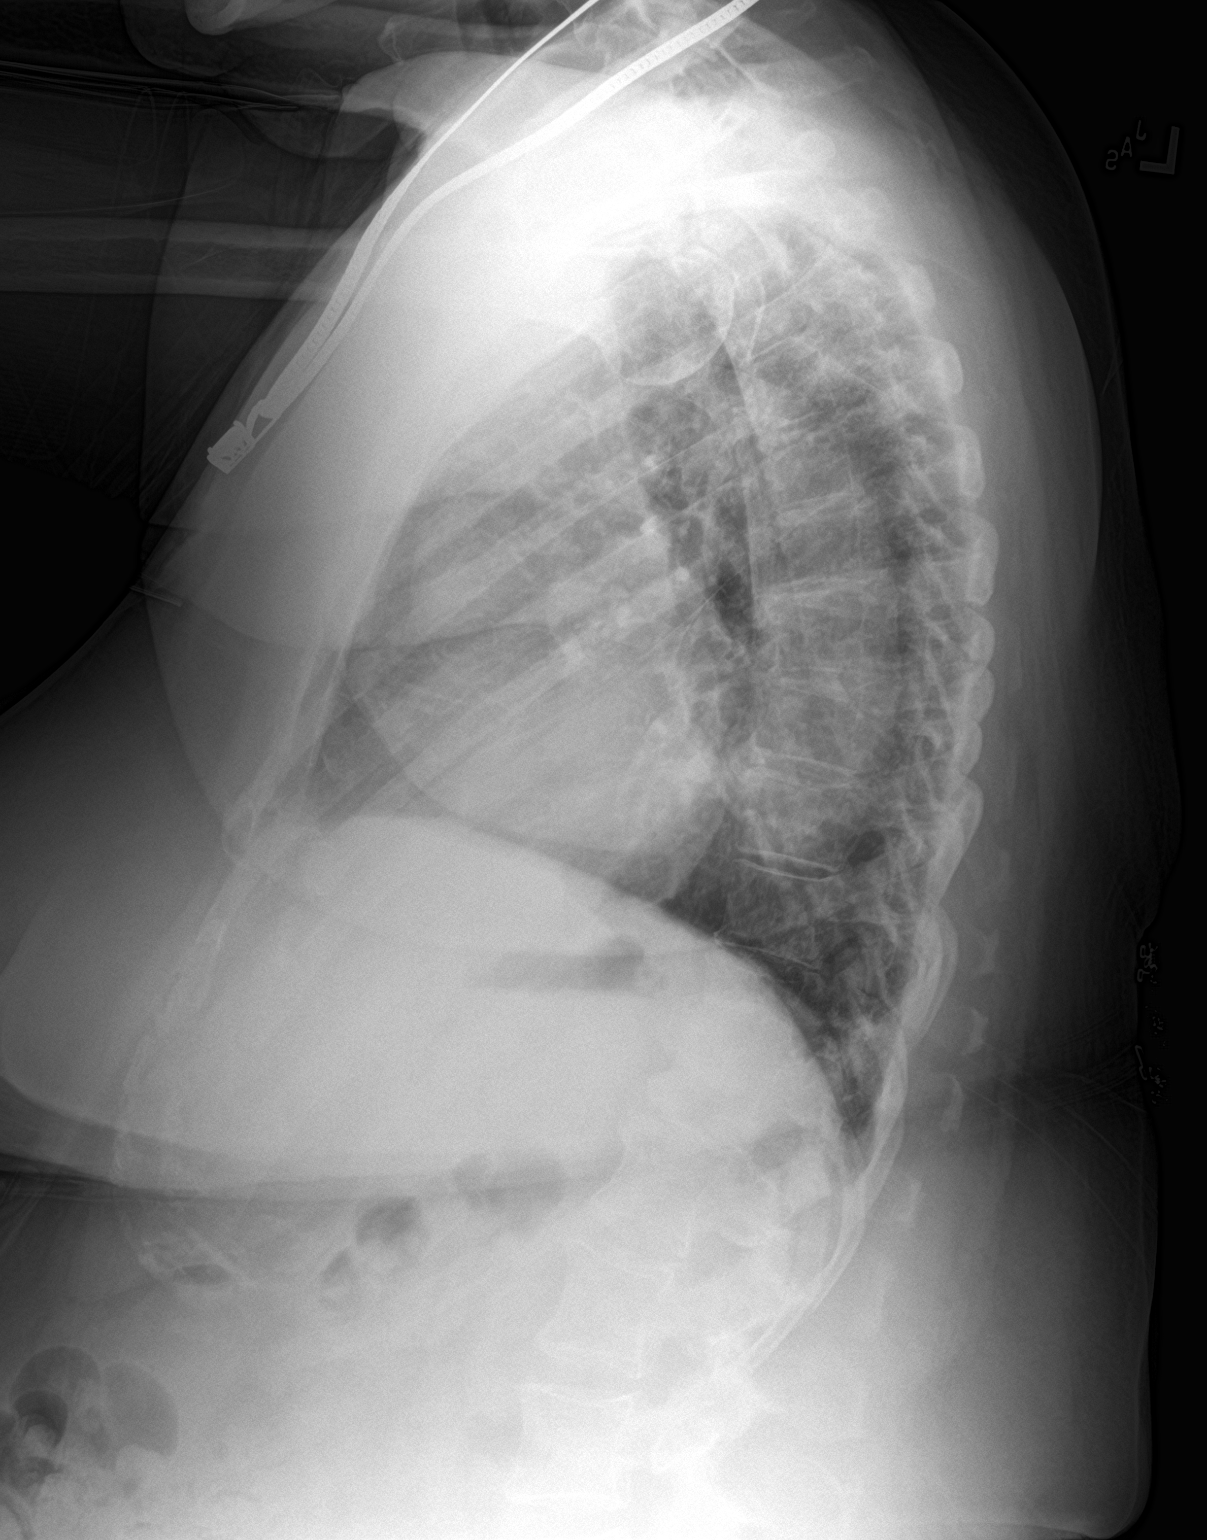

[2 of 2 positions shown; findings below may reference images not displayed]

FINDINGS: The lungs are well-aerated. Mild bibasilar atelectasis is noted.
There is no evidence of pleural effusion or pneumothorax.

The heart is borderline normal in size. No acute osseous
abnormalities are seen.
IMPRESSION: Mild bibasilar atelectasis noted.  Lungs otherwise grossly clear.

## 2018-10-31 IMAGING — CT CT ANGIO CHEST
2 of 9 series · 17 of 46 positions shown · IV contrast (OMNI)
Comparison: Chest CT dated 08/14/2010.

CLINICAL DATA: Left-sided chest pain since [REDACTED] morning, pain
with deep breaths.

EXAM:
CT ANGIOGRAPHY CHEST WITH CONTRAST
TECHNIQUE: Multidetector CT imaging of the chest was performed using the
standard protocol during bolus administration of intravenous
contrast. Multiplanar CT image reconstructions and MIPs were
obtained to evaluate the vascular anatomy.
CONTRAST:  80 cc Isovue 370

[Series 7: thins · axial · 0.84mm/px · z∈[+1117,+1315]mm · 14 of 220 slices shown]
[im 11/220  lung]
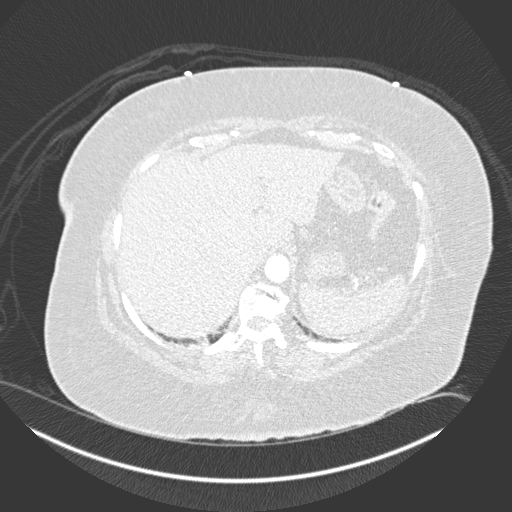
[im 32/220  soft-tissue]
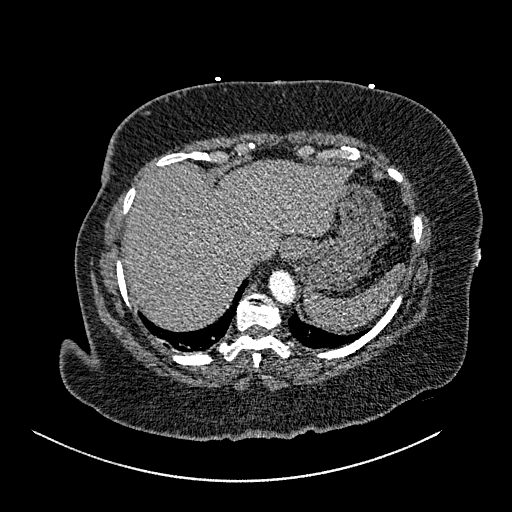
[im 42/220  lung]
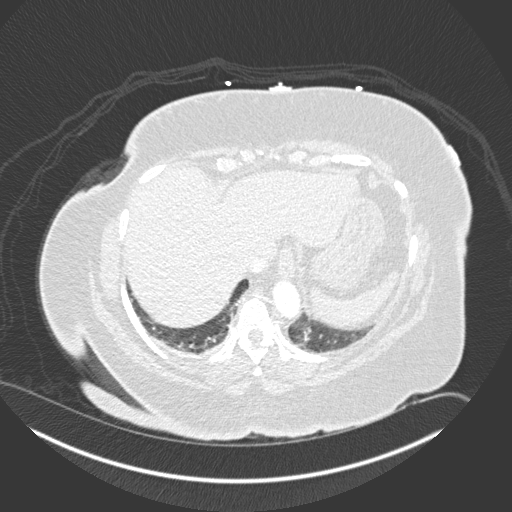
[im 63/220  soft-tissue]
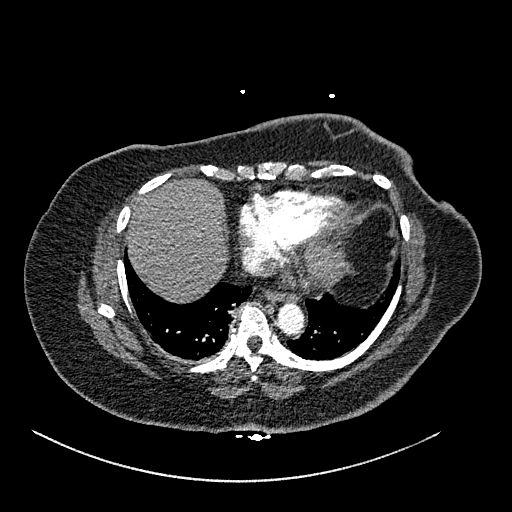
[im 74/220  lung]
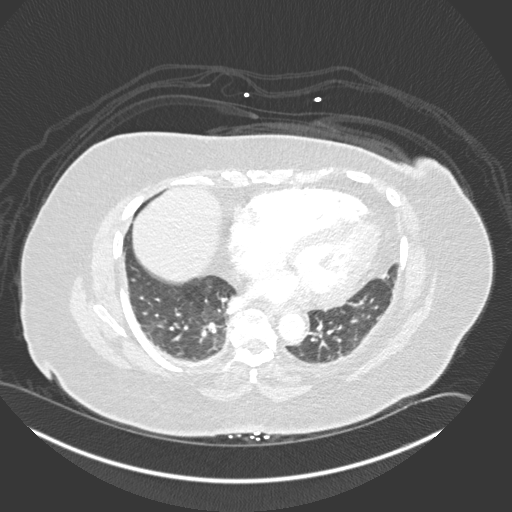
[im 84/220  soft-tissue]
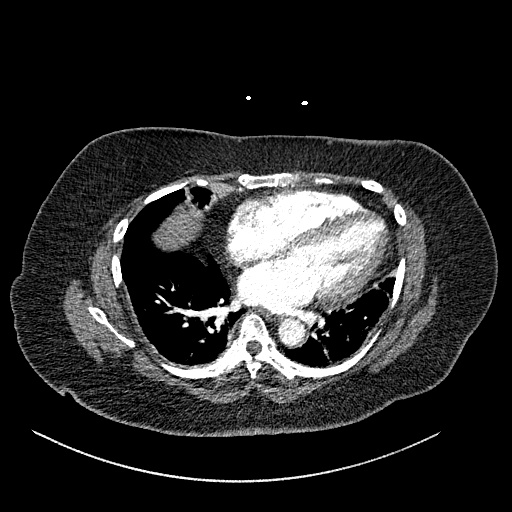
[im 105/220  lung]
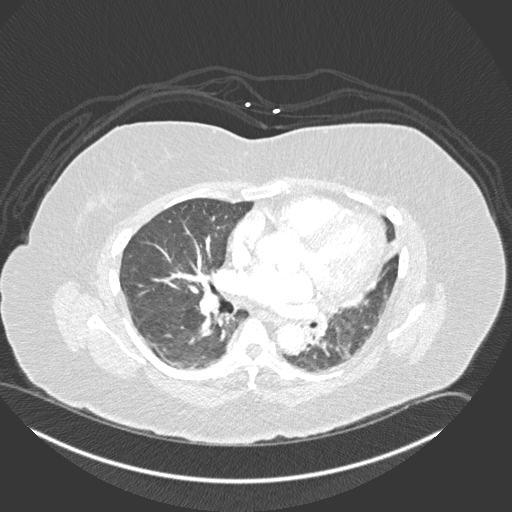
[im 115/220  soft-tissue]
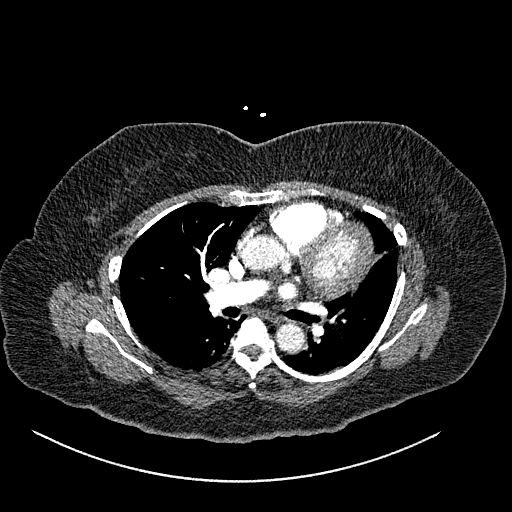
[im 136/220  lung]
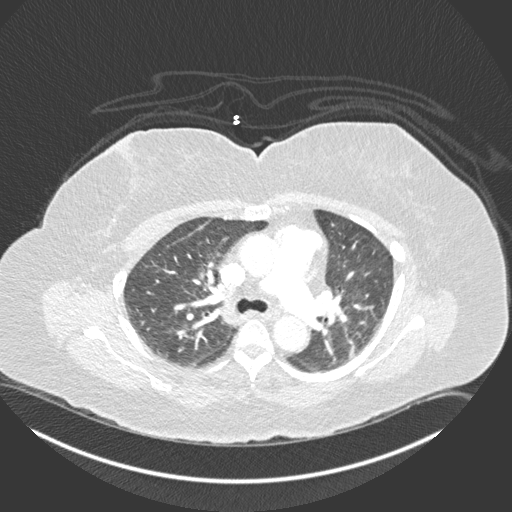
[im 147/220  soft-tissue]
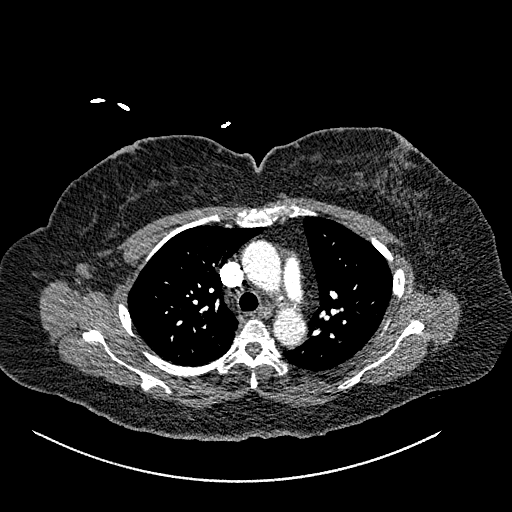
[im 167/220  lung]
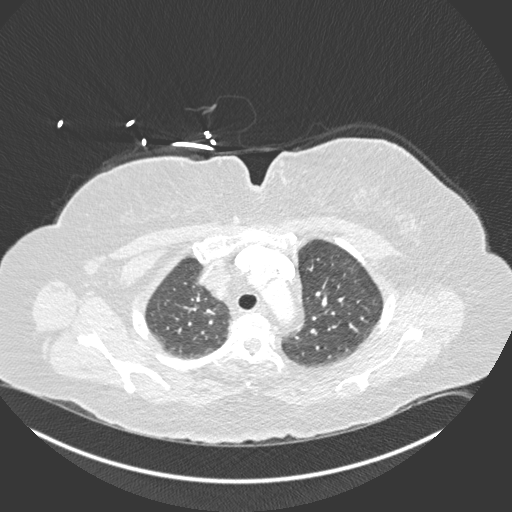
[im 178/220  soft-tissue]
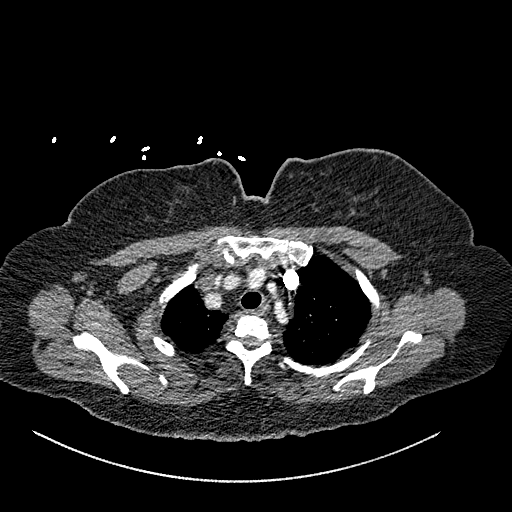
[im 188/220  lung]
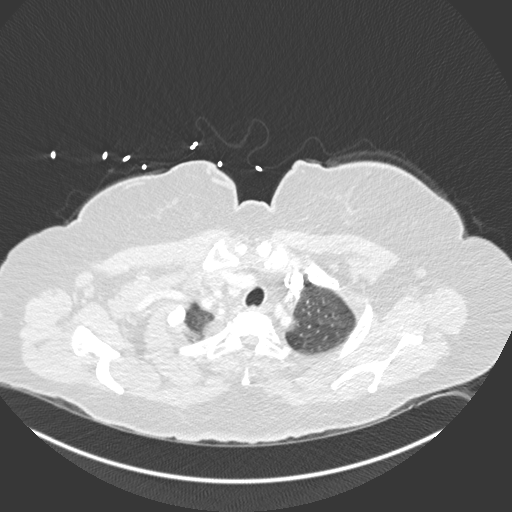
[im 209/220  soft-tissue]
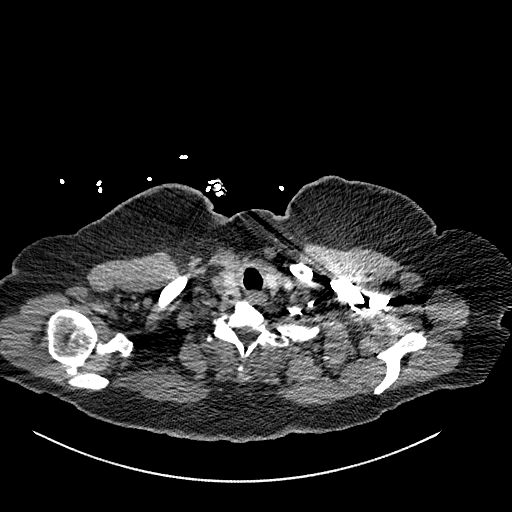

[Series 9: coronal mpr · coronal · 0.45mm/px · 3 of 151 slices shown]
[im 38/151  soft-tissue]
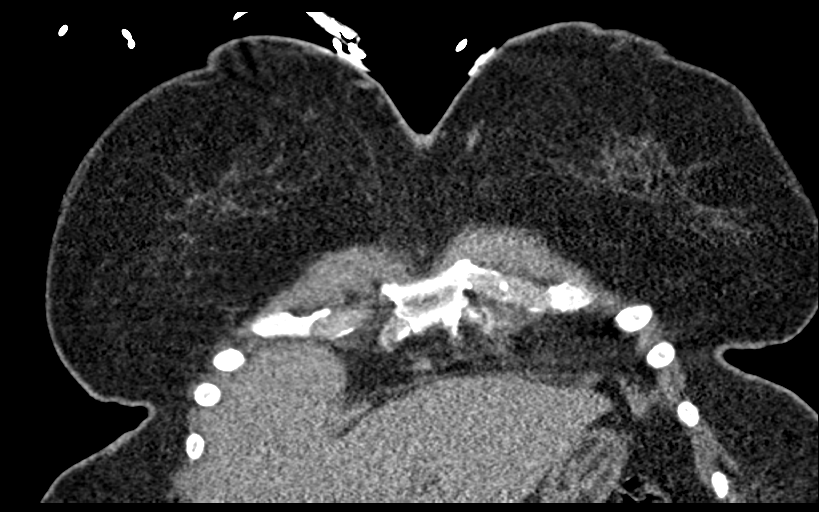
[im 76/151  soft-tissue]
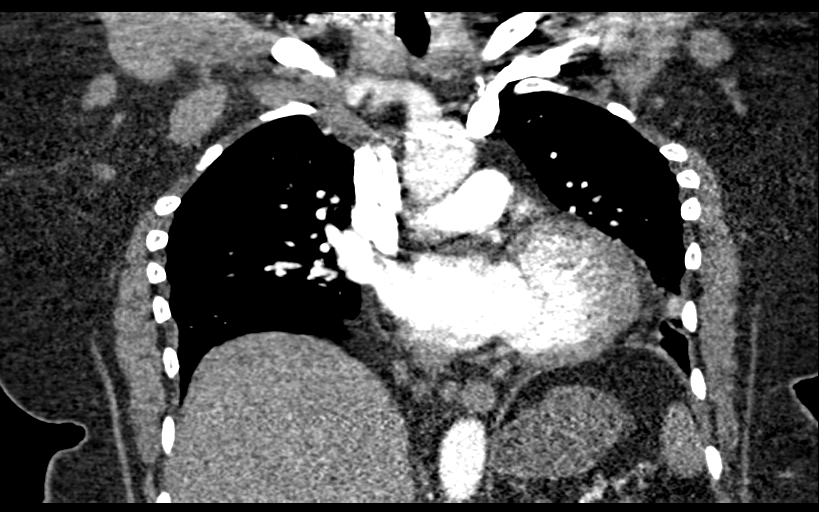
[im 113/151  soft-tissue]
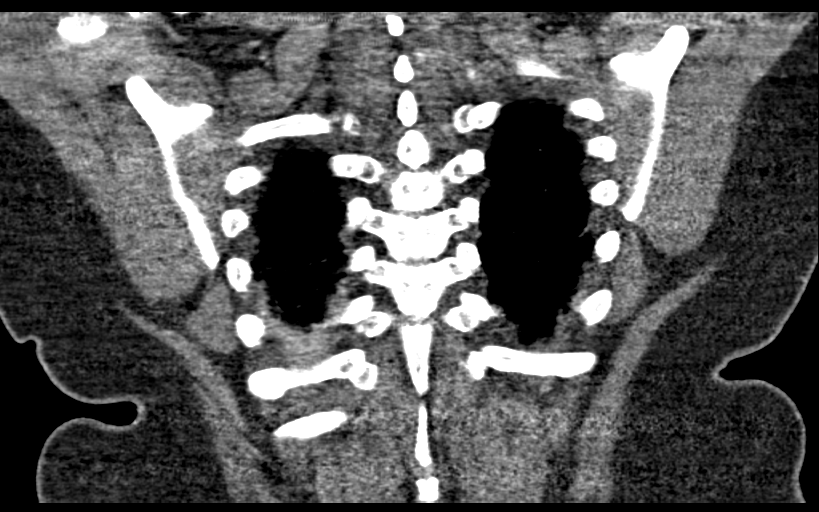

[17 of 46 positions shown; findings below may reference images not displayed]

FINDINGS: Cardiovascular: The most peripheral segmental and subsegmental
pulmonary artery branches of each lung are difficult to definitively
characterize due to mild patient breathing motion artifact, however,
there is no pulmonary embolism identified within the main, lobar or
central segmental pulmonary arteries bilaterally.

No thoracic aortic aneurysm or dissection. Mild cardiomegaly is
stable. No pericardial effusion.

Mediastinum/Nodes: Esophagus is unremarkable. No mass or enlarged
lymph nodes seen within the mediastinum or perihilar regions.
Somewhat prominent lymph node is identified in the left axilla
measuring 2.3 cm, new compared to the previous CT. Trachea and
central bronchi are unremarkable.

Lungs/Pleura: Mild scarring/atelectasis within each lung. Lungs
otherwise clear. No evidence of pneumonia. No pleural effusion or
pneumothorax.

Upper Abdomen: Limited images of the upper abdomen are unremarkable.

Musculoskeletal: Degenerative spurring within the thoracic spine and
lower cervical spine, mild to moderate in degree. No acute or
suspicious osseous finding.

Review of the MIP images confirms the above findings.
IMPRESSION: 1. No pulmonary embolism identified, with mild study limitations
detailed above.
2. No acute intrathoracic abnormality. No evidence of pneumonia or
pulmonary edema.
3. Stable mild cardiomegaly.
4. **An incidental finding of potential clinical significance has
been found. Prominent lymph node in the left axilla, new compared to
an earlier CT from 5395. Recommend nonemergent follow-up with left
axillary ultrasound and diagnostic mammogram at a dedicated [REDACTED].**

## 2018-11-01 ENCOUNTER — Other Ambulatory Visit: Payer: Self-pay

## 2018-11-01 ENCOUNTER — Encounter (INDEPENDENT_AMBULATORY_CARE_PROVIDER_SITE_OTHER): Payer: Medicare Other | Admitting: Ophthalmology

## 2018-11-01 DIAGNOSIS — E113311 Type 2 diabetes mellitus with moderate nonproliferative diabetic retinopathy with macular edema, right eye: Secondary | ICD-10-CM | POA: Diagnosis not present

## 2018-11-01 DIAGNOSIS — H43813 Vitreous degeneration, bilateral: Secondary | ICD-10-CM

## 2018-11-01 DIAGNOSIS — H35033 Hypertensive retinopathy, bilateral: Secondary | ICD-10-CM

## 2018-11-01 DIAGNOSIS — H33302 Unspecified retinal break, left eye: Secondary | ICD-10-CM

## 2018-11-01 DIAGNOSIS — E113292 Type 2 diabetes mellitus with mild nonproliferative diabetic retinopathy without macular edema, left eye: Secondary | ICD-10-CM | POA: Diagnosis not present

## 2018-11-01 DIAGNOSIS — E11311 Type 2 diabetes mellitus with unspecified diabetic retinopathy with macular edema: Secondary | ICD-10-CM

## 2018-11-01 DIAGNOSIS — I10 Essential (primary) hypertension: Secondary | ICD-10-CM

## 2018-11-01 DIAGNOSIS — G4733 Obstructive sleep apnea (adult) (pediatric): Secondary | ICD-10-CM | POA: Diagnosis not present

## 2018-11-07 DIAGNOSIS — G4733 Obstructive sleep apnea (adult) (pediatric): Secondary | ICD-10-CM | POA: Diagnosis not present

## 2018-11-14 ENCOUNTER — Ambulatory Visit (INDEPENDENT_AMBULATORY_CARE_PROVIDER_SITE_OTHER): Payer: Medicare Other | Admitting: Podiatry

## 2018-11-14 ENCOUNTER — Other Ambulatory Visit: Payer: Self-pay

## 2018-11-14 DIAGNOSIS — M79676 Pain in unspecified toe(s): Secondary | ICD-10-CM

## 2018-11-14 DIAGNOSIS — B351 Tinea unguium: Secondary | ICD-10-CM | POA: Diagnosis not present

## 2018-11-14 DIAGNOSIS — E0843 Diabetes mellitus due to underlying condition with diabetic autonomic (poly)neuropathy: Secondary | ICD-10-CM | POA: Diagnosis not present

## 2018-11-16 NOTE — Progress Notes (Signed)
   SUBJECTIVE Patient presents to office today complaining of elongated, thickened nails that cause pain while ambulating in shoes. She is unable to trim her own nails. Patient is here for further evaluation and treatment.  Past Medical History:  Diagnosis Date  . Clotting disorder ([place])   . Cough 02/08/2017  . Diabetic retinopathy ([place])   . 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 Full dentures   . GERD (gastroesophageal reflux disease)   . Heart murmur   . History of blood clots 1996   groin  . History of glaucoma    had laser correction  . Hypertension    states under control with meds., has been on med. since 1996  . Insulin dependent diabetes mellitus ([place])   . Mitral valve prolapse   . Osteoarthritis    bilateral knee  . Seasonal allergies   . Sleep apnea    no CPAP use in several months, per pt.  . Trigger finger, left middle finger 01/2017    OBJECTIVE General Patient is awake, alert, and oriented x 3 and in no acute distress. Derm Skin is dry and supple bilateral. Negative open lesions or macerations. Remaining integument unremarkable. Nails are tender, long, thickened and dystrophic with subungual debris, consistent with onychomycosis, 1-5 bilateral. No signs of infection noted. Vasc  DP and PT pedal pulses palpable bilaterally. Temperature gradient within normal limits.  Neuro Epicritic and protective threshold sensation grossly intact bilaterally.  Musculoskeletal Exam No symptomatic pedal deformities noted bilateral. Muscular strength within normal limits.  ASSESSMENT 1. Onychodystrophic nails 1-5 bilateral with hyperkeratosis of nails.  2. Onychomycosis of nail due to dermatophyte bilateral 3. Pain in foot bilateral  PLAN OF CARE 1. Patient evaluated today.  2. Instructed to maintain good pedal hygiene and foot care.  3. Mechanical debridement of nails 1-5 bilaterally performed using a nail nipper. Filed with dremel without incident.  4. Return to  clinic in 3 mos.    Edrick Kins, DPM [facility]  Dr. Edrick Kins, [place]                                        [place], [place] [address]                Office [phone]  Fax [phone]

## 2018-12-13 ENCOUNTER — Other Ambulatory Visit: Payer: Self-pay

## 2018-12-13 ENCOUNTER — Encounter (INDEPENDENT_AMBULATORY_CARE_PROVIDER_SITE_OTHER): Payer: Medicare Other | Admitting: Ophthalmology

## 2018-12-13 DIAGNOSIS — E113311 Type 2 diabetes mellitus with moderate nonproliferative diabetic retinopathy with macular edema, right eye: Secondary | ICD-10-CM | POA: Diagnosis not present

## 2018-12-13 DIAGNOSIS — H35033 Hypertensive retinopathy, bilateral: Secondary | ICD-10-CM | POA: Diagnosis not present

## 2018-12-13 DIAGNOSIS — I10 Essential (primary) hypertension: Secondary | ICD-10-CM | POA: Diagnosis not present

## 2018-12-13 DIAGNOSIS — E11311 Type 2 diabetes mellitus with unspecified diabetic retinopathy with macular edema: Secondary | ICD-10-CM | POA: Diagnosis not present

## 2018-12-13 DIAGNOSIS — E113392 Type 2 diabetes mellitus with moderate nonproliferative diabetic retinopathy without macular edema, left eye: Secondary | ICD-10-CM

## 2018-12-13 DIAGNOSIS — H43813 Vitreous degeneration, bilateral: Secondary | ICD-10-CM

## 2018-12-20 ENCOUNTER — Ambulatory Visit: Payer: Self-pay

## 2018-12-20 ENCOUNTER — Other Ambulatory Visit: Payer: Self-pay

## 2018-12-20 ENCOUNTER — Ambulatory Visit (INDEPENDENT_AMBULATORY_CARE_PROVIDER_SITE_OTHER): Payer: Medicare Other | Admitting: Orthopaedic Surgery

## 2018-12-20 ENCOUNTER — Encounter: Payer: Self-pay | Admitting: Orthopaedic Surgery

## 2018-12-20 VITALS — BP 126/59 | HR 83 | Ht 64.0 in | Wt 231.0 lb

## 2018-12-20 DIAGNOSIS — T84032D Mechanical loosening of internal right knee prosthetic joint, subsequent encounter: Secondary | ICD-10-CM | POA: Diagnosis not present

## 2018-12-20 DIAGNOSIS — G8929 Other chronic pain: Secondary | ICD-10-CM

## 2018-12-20 DIAGNOSIS — M25561 Pain in right knee: Secondary | ICD-10-CM | POA: Diagnosis not present

## 2018-12-20 DIAGNOSIS — T84032A Mechanical loosening of internal right knee prosthetic joint, initial encounter: Secondary | ICD-10-CM | POA: Insufficient documentation

## 2018-12-20 MED ORDER — LIDOCAINE HCL 1 % IJ SOLN
2.0000 mL | INTRAMUSCULAR | Status: AC | PRN
  Administered 2018-12-20: 2 mL

## 2018-12-20 NOTE — Progress Notes (Signed)
Office Visit Note   [name]           Date of Birth: 1946/03/12           MRN: [id number] Visit Date: 12/20/2018              Requested by: Lucianne Lei, [address] [place],  [place] [address] PCP: Lucianne Lei, MD   Assessment & Plan: Visit Diagnoses:  1. Chronic pain of right knee   2. 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 #2:  At this time we will order a three-phase bone scan to assess for loosening. #3: She will follow back up with [place] in 2weeks for review of the bone scan.  Follow-Up Instructions: Return in about 2 weeks (around 01/03/2019).   Orders:  Orders Placed This Encounter  Procedures  . Large Joint Inj  . Anaerobic and Aerobic Culture  . XR KNEE 3 VIEW RIGHT  . NM Bone Scan 3 Phase Lower Extremity  . Synovial cell count + diff, w/ crystals  . CBC w/Diff/Platelet  . Sed Rate (ESR)  . CRP High sensitivity   No orders of the defined types were placed in this encounter.     Procedures: Large Joint Inj: R knee on 12/20/2018 10:11 AM Indications: pain and diagnostic evaluation Details: 18 G 1.5 in needle, anteromedial approach  Arthrogram: No  Medications: 2 mL lidocaine 1 % Aspirate: yellow; sent for lab analysis Outcome: tolerated well, no immediate complications Procedure, treatment alternatives, risks and benefits explained, specific risks discussed. Consent was given by the patient. Immediately prior to procedure a time out was called to verify the correct patient, procedure, equipment, support staff and site/side marked as required. Patient was prepped and draped in the usual sterile fashion.       Clinical Data: No additional findings.   Subjective: Chief Complaint  Patient presents with  . Right Knee - Pain     HPI Patient presents today for right knee pain. She said that her knee has continued to hurt since her last visit in March of 2019. She has been told she needs therapy, but is fearful of therapy with the coronavirus. Her pain is located all throughout, but worse anteriorly. She takes tylenol for pain. She has a history of a right total knee arthroplasty in 2009.   In January of this year apparently she had a fall where she landed directly on her knees.  At that time she had really significant pain in the right knee.  She had actually had a laceration to her left hand.  Since that time her symptoms have worsened.  She has been on Coumadin since 1996 using 7.5 alternating with 10 mg every other day.   Review of Systems  Constitutional: Negative for fatigue.  HENT: Negative for ear pain.   Eyes: Negative for pain.  Respiratory: Negative for shortness of breath.   Cardiovascular: Negative for leg swelling.  Gastrointestinal: Negative for constipation and diarrhea.  Endocrine: Negative for cold intolerance and heat intolerance.  Genitourinary: Negative for difficulty urinating.  Musculoskeletal: Negative for joint swelling.  Skin: Negative for rash.  Allergic/Immunologic: Negative for food allergies.  Neurological: Negative for weakness.  Hematological: Does not bruise/bleed easily.  Psychiatric/Behavioral: Negative for sleep disturbance.  Objective: Vital Signs: BP (!) 126/59   Pulse 83   Ht '5\' 4"'$  (1.626 m)   Wt 231 lb (104.8 kg)   BMI 39.65 kg/m   Physical Exam Constitutional:      Appearance: She is well-developed.  Eyes:     Pupils: Pupils are equal, round, and reactive to light.  Pulmonary:     Effort: Pulmonary effort is normal.  Skin:    General: Skin is warm and dry.  Neurological:     Mental Status: She is alert and oriented to person, place, and time.  Psychiatric:        Behavior: Behavior normal.     Ortho Exam  Exam today reveals the need to have a little  bit of an effusion.  No warmth or erythema.  She ranges from near full extension to that of about 90 degrees of flexion.  There is some crepitance with range of motion noted.  She does open some with valgus stressing.  Neurovascular intact distally.  Calf is supple nontender.  Specialty Comments:  No specialty comments available.  Imaging: Xr Knee 3 View Right  Result Date: 12/20/2018 Three-view x-ray of the right knee reveals about a 12 degree deformity of a varus type in the total knee.  There is a cystic change on the medial tibial plateau surrounding the 20% of the tibial plate metaphyseal area.  There is certainly loosening noted also the lateral portion of the tibial plateau.  Some cystic changes are also noted there but to the lesser extent.  There is some notching of the anterior femur which may just be from loosening.  Patellofemoral area is in good position alignment.     PMFS History: Current Outpatient Medications  Medication Sig Dispense Refill  . ACCU-CHEK AVIVA PLUS test strip CHECK BLOOD GLUCOSE TWICE A DAY  4  . acetaminophen (TYLENOL) 500 MG tablet Take 500-1,000 mg by mouth every 6 (six) hours as needed for mild pain.    Marland Kitchen acetaminophen-codeine (TYLENOL #3) 300-30 MG tablet Take 1 tablet by mouth every 8 (eight) hours as needed for moderate pain. 15 tablet 0  . atorvastatin (LIPITOR) 20 MG tablet Take 10 mg by mouth at bedtime.    Marland Kitchen azelastine (ASTELIN) 0.1 % nasal spray PLACE 2 SPRAYS INTO BOTH NOSTRILS 2 (TWO) TIMES DAILY. 30 mL 5  . benazepril (LOTENSIN) 40 MG tablet Take 40 mg by mouth every morning.     Marland Kitchen BESIVANCE 0.6 % SUSP INSTILL ONE DROP INTO RIGHT EYE 4 TIMES A DAY FOR 2 DAYS AFTER EACH MONTHLY EYE INJECTION    . Blood Glucose Monitoring Suppl (GLUCOCOM BLOOD GLUCOSE MONITOR) DEVI Accu-Chek Aviva Plus Meter    . cholecalciferol (VITAMIN D) 1000 UNITS tablet Take 1,000 Units by mouth at bedtime.     . cyclobenzaprine (FLEXERIL) 10 MG tablet Take 1 tablet (10 mg  total) by mouth 2 (two) times daily as needed for muscle spasms. 20 tablet 0  . desonide (DESOWEN) 0.05 % lotion Apply 1 application topically 2 (two) times daily as needed (rash).     . diclofenac sodium (VOLTAREN) 1 % GEL Apply 2 g topically 4 (four) times daily. 1 Tube 0  . esomeprazole (NEXIUM) 40 MG capsule Take 40 mg by mouth daily before breakfast.    . fexofenadine (ALLEGRA) 180 MG tablet Take 180 mg by mouth daily as needed for allergies.     . fluticasone (FLONASE) 50 MCG/ACT nasal spray Place 1 spray into both nostrils daily as  needed for allergies.     . folic acid (FOLVITE) 1 MG tablet Take 1 mg by mouth daily.    . furosemide (LASIX) 40 MG tablet Take 40 mg by mouth daily.    Marland Kitchen HUMALOG MIX 75/25 KWIKPEN (75-25) 100 UNIT/ML Kwikpen INJECT 60 UNITS EVERY MORNING    . HYDROcodone-acetaminophen (NORCO/VICODIN) 5-325 MG tablet Take 1 tablet by mouth 4 (four) times daily as needed. 6 tablet 0  . insulin NPH-insulin regular (NOVOLIN 70/30) (70-30) 100 UNIT/ML injection Inject 4-5 Units into the skin See admin instructions. Check blood sugar and take 4 to 5 units depending on blood sugar.    . Insulin Pen Needle (FIFTY50 PEN NEEDLES) 31G X 8 MM MISC BD Ultra-Fine Short Pen Needle 31 gauge x 5/16"    . Insulin Pen Needle (NOVOFINE PLUS) 32G X 4 MM MISC NovoFine Plus 32 gauge x 1/6" needle  USE 2 TIMES DAILY    . Insulin Pen Needle (NOVOFINE) 30G X 8 MM MISC NovoFine 30 30 gauge x 1/3" needle    . Lancet Devices ([employer] LANCING DEVICE) MISC Accu-Chek FastClix Lancing Device    . metFORMIN (GLUCOPHAGE) 1000 MG tablet Take 1,000 mg by mouth at bedtime.  3  . montelukast (SINGULAIR) 10 MG tablet Take 10 mg by mouth at bedtime.    . pregabalin (LYRICA) 100 MG capsule Take 100 mg by mouth 2 (two) times daily.    . sitaGLIPtin-metformin (JANUMET) 50-1000 MG tablet Take 1 tablet by mouth every morning.     . Sulfacetamide Sodium (SODIUM SULFACETAMIDE EX) Apply 1 application topically every 6 (six)  hours as needed (for itching).     . verapamil (COVERA HS) 180 MG (CO) 24 hr tablet Take 180 mg by mouth 2 (two) times daily.    . vitamin C (ASCORBIC ACID) 500 MG tablet Take 500 mg by mouth every morning.    . warfarin (COUMADIN) 10 MG tablet Take 10 mg by mouth See admin instructions. Take 1 tablet by mouth every other day, alternating with 7.'5mg'$  tablet  3  . warfarin (COUMADIN) 7.5 MG tablet Take 7.5 mg by mouth See admin instructions. Take 1 tablet by mouth every other day alternating with '10mg'$  tablet  3   No current facility-administered medications for this visit.     Ask him a question about her whether we need to Patient Active Problem List   Diagnosis Date Noted  . 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 Sleep apnea 01/31/2018  . Atrophic vaginitis 01/31/2018  . Hyponatremia 01/31/2018  . Increased body mass index 01/31/2018  . Osteoarthritis 01/31/2018  . Postmenopausal bleeding 01/31/2018  . Systemic lupus erythematosus ([place]) 01/31/2018  . Uterine leiomyoma 01/31/2018  . Trigger middle finger of left hand 01/11/2017  . 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 High risk medication use 01/06/2016  . 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 Vitamin D deficiency 01/06/2016  . Deep venous thrombosis of lower extremity ([place]) 06/04/2015  . Osteopenia 03/14/2012  . Diabetes mellitus type 2 in obese ([place]) 03/14/2012  . 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 Hypertension 03/14/2012   Past Medical History:  Diagnosis Date  . Clotting disorder ([place])   . Cough 02/08/2017  . Diabetic retinopathy ([place])   . 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 Full dentures   . GERD (gastroesophageal reflux disease)   . Heart murmur   .  History of blood clots 1996   groin  . History of glaucoma    had laser correction  . Hypertension    states under  control with meds., has been on med. since 1996  . Insulin dependent diabetes mellitus   . Mitral valve prolapse   . Osteoarthritis    bilateral knee  . Seasonal allergies   . Sleep apnea    no CPAP use in several months, per pt.  . Trigger finger, left middle finger 01/2017    Family History  Problem Relation Age of Onset  . Cancer Mother        colon  . Cancer Cousin        lung    Past Surgical History:  Procedure Laterality Date  . BACK SURGERY     lower - removed  cysts  . CATARACT EXTRACTION W/ INTRAOCULAR LENS  IMPLANT, BILATERAL Bilateral   . GLAUCOMA SURGERY Bilateral    laser  . HEMILAMINOTOMY LUMBAR SPINE Right 01/30/2009   L5  . JOINT REPLACEMENT    . TOTAL KNEE ARTHROPLASTY Right 09/22/2007  . TOTAL KNEE ARTHROPLASTY Left 02/01/2007  . TRIGGER FINGER RELEASE Right 12/14/2013   Procedure: RELEASE TRIGGER FINGER/A-1 PULLEY RIGHT RING FINGER;  Surgeon: Daryll Brod, MD;  Location: [facility];  Service: Orthopedics;  Laterality: Right;  . TRIGGER FINGER RELEASE Left 02/11/2017   Procedure: RELEASE TRIGGER FINGER/A-1 PULLEY;  Surgeon: Leanora Cover, MD;  Location: [facility];  Service: Orthopedics;  Laterality: Left;   Social History   Occupational History  . Not on file  Tobacco Use  . Smoking status: Never Smoker  . Smokeless tobacco: Never Used  Substance and Sexual Activity  . Alcohol use: No  . Drug use: No  . Sexual activity: Not on file

## 2018-12-21 DIAGNOSIS — I10 Essential (primary) hypertension: Secondary | ICD-10-CM | POA: Diagnosis not present

## 2018-12-21 DIAGNOSIS — E1169 Type 2 diabetes mellitus with other specified complication: Secondary | ICD-10-CM | POA: Diagnosis not present

## 2018-12-21 DIAGNOSIS — I82409 Acute embolism and thrombosis of unspecified deep veins of unspecified lower extremity: Secondary | ICD-10-CM | POA: Diagnosis not present

## 2018-12-21 DIAGNOSIS — E039 Hypothyroidism, unspecified: Secondary | ICD-10-CM | POA: Diagnosis not present

## 2018-12-21 LAB — HIGH SENSITIVITY CRP: hs-CRP: >10 mg/L — ABNORMAL HIGH

## 2018-12-21 LAB — CBC WITH DIFFERENTIAL/PLATELET
Absolute Monocytes: 557 cells/uL (ref 200–950)
Basophils Absolute: 38 cells/uL (ref 0–200)
Basophils Relative: 0.4 %
Eosinophils Absolute: 67 cells/uL (ref 15–500)
Eosinophils Relative: 0.7 %
HCT: 38.9 % (ref 35.0–45.0)
Hemoglobin: 12.3 g/dL (ref 11.7–15.5)
Lymphs Abs: 2381 cells/uL (ref 850–3900)
MCH: 27.6 pg (ref 27.0–33.0)
MCHC: 31.6 g/dL — ABNORMAL LOW (ref 32.0–36.0)
MCV: 87.2 fL (ref 80.0–100.0)
MPV: 11.1 fL (ref 7.5–12.5)
Monocytes Relative: 5.8 %
Neutro Abs: 6557 cells/uL (ref 1500–7800)
Neutrophils Relative %: 68.3 %
Platelets: 386 10*3/uL (ref 140–400)
RBC: 4.46 10*6/uL (ref 3.80–5.10)
RDW: 14.4 % (ref 11.0–15.0)
Total Lymphocyte: 24.8 %
WBC: 9.6 10*3/uL (ref 3.8–10.8)

## 2018-12-21 LAB — SEDIMENTATION RATE: Sed Rate: 39 mm/h — ABNORMAL HIGH (ref 0–30)

## 2018-12-22 ENCOUNTER — Other Ambulatory Visit: Payer: Self-pay | Admitting: Orthopaedic Surgery

## 2018-12-22 DIAGNOSIS — G8929 Other chronic pain: Secondary | ICD-10-CM

## 2018-12-26 LAB — ANAEROBIC AND AEROBIC CULTURE
AER RESULT:: NO GROWTH
MICRO NUMBER:: 963249
MICRO NUMBER:: 963250
SPECIMEN QUALITY:: ADEQUATE
SPECIMEN QUALITY:: ADEQUATE

## 2018-12-26 LAB — SYNOVIAL CELL COUNT + DIFF, W/ CRYSTALS
Basophils, %: 0 %
Eosinophils-Synovial: 0 % (ref 0–2)
Lymphocytes-Synovial Fld: 40 % (ref 0–74)
Monocyte/Macrophage: 52 % (ref 0–69)
Neutrophil, Synovial: 4 % (ref 0–24)
Synoviocytes, %: 4 % (ref 0–15)
WBC, Synovial: 812 cells/uL — ABNORMAL HIGH (ref <150)

## 2018-12-27 ENCOUNTER — Ambulatory Visit (HOSPITAL_COMMUNITY)
Admission: RE | Admit: 2018-12-27 | Discharge: 2018-12-27 | Disposition: A | Discharge status: Home / Self Care | Payer: Medicare Other | Source: Ambulatory Visit | Attending: Orthopedic Surgery | Admitting: Orthopedic Surgery

## 2018-12-27 ENCOUNTER — Encounter (HOSPITAL_COMMUNITY)
Admission: RE | Admit: 2018-12-27 | Discharge: 2018-12-27 | Disposition: A | Discharge status: Home / Self Care | Payer: Medicare Other | Source: Ambulatory Visit | Attending: Orthopedic Surgery | Admitting: Orthopedic Surgery

## 2018-12-27 ENCOUNTER — Other Ambulatory Visit: Payer: Self-pay

## 2018-12-27 DIAGNOSIS — T84032D Mechanical loosening of internal right knee prosthetic joint, subsequent encounter: Secondary | ICD-10-CM | POA: Diagnosis not present

## 2018-12-27 DIAGNOSIS — Z471 Aftercare following joint replacement surgery: Secondary | ICD-10-CM | POA: Diagnosis not present

## 2018-12-27 DIAGNOSIS — Z96651 Presence of right artificial knee joint: Secondary | ICD-10-CM | POA: Diagnosis not present

## 2018-12-27 MED ORDER — TECHNETIUM TC 99M MEDRONATE IV KIT
20.0000 | PACK | Freq: Once | INTRAVENOUS | Status: AC | PRN
  Administered 2018-12-27: 20 via INTRAVENOUS

## 2018-12-28 ENCOUNTER — Telehealth: Payer: Self-pay | Admitting: Orthopaedic Surgery

## 2018-12-28 NOTE — Telephone Encounter (Signed)
Patient called checking the status of the referral for her bone scan that Dr. Durward Fortes ordered.  Patient states she hasn't received a call to schedule.

## 2018-12-28 NOTE — Telephone Encounter (Signed)
Please see below.

## 2018-12-29 NOTE — Telephone Encounter (Signed)
Called and spoke with patient. She is coming in on the 20th for results.

## 2018-12-29 NOTE — Telephone Encounter (Signed)
Kaitlyn Jackson had placed an order on 12/22/18, it looks as if she already had the Bone scan performed on the 13th. Is there suppose to be another scan done? Maybe she is asking about the results of the scan?

## 2019-01-03 ENCOUNTER — Encounter: Payer: Self-pay | Admitting: Orthopaedic Surgery

## 2019-01-03 ENCOUNTER — Ambulatory Visit (INDEPENDENT_AMBULATORY_CARE_PROVIDER_SITE_OTHER): Payer: Medicare Other | Admitting: Orthopaedic Surgery

## 2019-01-03 ENCOUNTER — Other Ambulatory Visit: Payer: Self-pay

## 2019-01-03 VITALS — BP 110/48 | HR 94 | Resp 18 | Ht 64.0 in | Wt 231.0 lb

## 2019-01-03 DIAGNOSIS — T84032D Mechanical loosening of internal right knee prosthetic joint, subsequent encounter: Secondary | ICD-10-CM | POA: Diagnosis not present

## 2019-01-03 DIAGNOSIS — R638 Other symptoms and signs concerning food and fluid intake: Secondary | ICD-10-CM

## 2019-01-03 NOTE — Progress Notes (Signed)
Office Visit Note   [name]           Date of Birth: 1945/10/30           MRN: [id number] Visit Date: 01/03/2019              Requested by: Lucianne Lei, [address] [place],  [place] [address] PCP: Lucianne Lei, MD   Assessment & Plan: Visit Diagnoses:  1. 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 Increased body mass index     Plan: Three-phase bone scan demonstrates increased uptake about the tibial component of the right total knee replacement.  There is very little change from the scan that was performed 2 years ago.  Index surgery was performed in 2009.  There is loosening of the femoral component.  The patella and femoral components appear to be intact.  Lab work was not consistent with infection.  Long discussion with Kaitlyn Jackson regarding treatment and specifically total knee revision.  I think we might be able to just revise the tibial component.  Have discussed the surgery hospitalization and and what she may expect postoperatively.  Will need clearance from Dr. Criss Rosales, her primary care physician.  We will proceed with scheduling once that is in hand.  Discussion over 30 minutes 50% of the time in counseling  Follow-Up Instructions: Return We will schedule revision surgery of right total knee replacement.   Orders:  No orders of the defined types were placed in this encounter.  No orders of the defined types were placed in this encounter.  Kaitlyn Jackson is a 73 year old female who returns for the 3 phase bone scan results. She wants to discuss right total knee replacement.    Procedures: No procedures performed   Clinical Data: No additional findings.   Subjective: Chief Complaint  Patient presents with  . Right Knee - Follow-up  No change in symptoms referable to the right total knee replacement.  Does use a cane to help with ambulation.  HPI  Review of Systems  Constitutional: Positive  for fatigue.  HENT: Negative for trouble swallowing.   Eyes: Negative for pain.  Respiratory: Negative for shortness of breath.   Cardiovascular: Positive for leg swelling.  Gastrointestinal: Positive for constipation and diarrhea.  Endocrine: Negative for cold intolerance.  Genitourinary: Negative for difficulty urinating.  Musculoskeletal: Positive for back pain, gait problem and joint swelling.  Skin: Negative for rash.  Allergic/Immunologic: Negative for food allergies.  Neurological: Positive for weakness and numbness.  Hematological: Does not bruise/bleed easily.  Psychiatric/Behavioral: Negative for sleep disturbance.     Objective: Vital Signs: BP (!) 110/48 (BP Location: Left Arm, Patient Position: Sitting, Cuff Size: Normal)   Pulse 94   Resp 18   Ht 5\' 4"  (1.626 m)   Wt 231 lb (104.8 kg)   BMI 39.65 kg/m   Physical Exam Constitutional:      Appearance: She is well-developed.  Eyes:     Pupils: Pupils are equal, round, and reactive to light.  Pulmonary:     Effort: Pulmonary effort is normal.  Skin:    General: Skin is warm and dry.  Neurological:     Mental Status: She is alert and oriented to person, place, and time.  Psychiatric:        Behavior: Behavior normal.     Ortho Exam awake alert and oriented x3.  Comfortable sitting.  Uses a single-point cane  to help with ambulation.  Right knee was not hot warm red or swollen.  The prior incision is healed without problem.  Minimal opening with a varus and valgus stress.  Full extension of flexes about 105 degrees.  Does have some pain along the proximal tibia both medially and laterally.  No distal edema.  Neurologically intact.  Straight leg raise negative.  Painless range of motion right hip  Specialty Comments:  No specialty comments available.  Imaging: No results found.   PMFS History: Patient Active Problem List   Diagnosis Date Noted  . 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 Sleep apnea 01/31/2018  . Atrophic vaginitis 01/31/2018  . Hyponatremia 01/31/2018  . Increased body mass index 01/31/2018  . Osteoarthritis 01/31/2018  . Postmenopausal bleeding 01/31/2018  . Systemic lupus erythematosus ([place]) 01/31/2018  . Uterine leiomyoma 01/31/2018  . Trigger middle finger of left hand 01/11/2017  . 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 High risk medication use 01/06/2016  . 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 Vitamin D deficiency 01/06/2016  . Deep venous thrombosis of lower extremity ([place]) 06/04/2015  . Osteopenia 03/14/2012  . Diabetes mellitus type 2 in obese ([place]) 03/14/2012  . 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 Hypertension 03/14/2012   Past Medical History:  Diagnosis Date  . Clotting disorder ([place])   . Cough 02/08/2017  . Diabetic retinopathy ([place])   . 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 Full dentures   . GERD (gastroesophageal reflux disease)   . Heart murmur   . History of blood clots 1996   groin  . History of glaucoma    had laser correction  . Hypertension    states under control with meds., has been on med. since 1996  . Insulin dependent diabetes mellitus   . Mitral valve prolapse   . Osteoarthritis    bilateral knee  . Seasonal allergies   . Sleep apnea    no CPAP use in several months, per pt.  . Trigger finger, left middle finger 01/2017    Family History  Problem Relation Age of Onset  . Cancer Mother        colon  . Cancer Cousin        lung    Past Surgical History:  Procedure Laterality Date  . BACK SURGERY     lower - removed  cysts  . CATARACT EXTRACTION W/ INTRAOCULAR LENS  IMPLANT, BILATERAL Bilateral   . GLAUCOMA SURGERY Bilateral    laser  . HEMILAMINOTOMY LUMBAR SPINE Right 01/30/2009   L5  . JOINT REPLACEMENT    . TOTAL KNEE ARTHROPLASTY Right 09/22/2007  . TOTAL KNEE  ARTHROPLASTY Left 02/01/2007  . TRIGGER FINGER RELEASE Right 12/14/2013   Procedure: RELEASE TRIGGER FINGER/A-1 PULLEY RIGHT RING FINGER;  Surgeon: Daryll Brod, MD;  Location: [facility];  Service: Orthopedics;  Laterality: Right;  . TRIGGER FINGER RELEASE Left 02/11/2017   Procedure: RELEASE TRIGGER FINGER/A-1 PULLEY;  Surgeon: Leanora Cover, MD;  Location: [facility];  Service: Orthopedics;  Laterality: Left;   Social History   Occupational History  . Not on file  Tobacco Use  . Smoking status: Never Smoker  . Smokeless tobacco: Never Used  Substance and Sexual Activity  . Alcohol use: No  . Drug use: No  . Sexual activity: Not on file

## 2019-01-13 ENCOUNTER — Other Ambulatory Visit: Payer: Self-pay

## 2019-01-17 ENCOUNTER — Encounter (INDEPENDENT_AMBULATORY_CARE_PROVIDER_SITE_OTHER): Payer: Medicare Other | Admitting: Ophthalmology

## 2019-01-17 ENCOUNTER — Other Ambulatory Visit: Payer: Self-pay

## 2019-01-17 DIAGNOSIS — E113392 Type 2 diabetes mellitus with moderate nonproliferative diabetic retinopathy without macular edema, left eye: Secondary | ICD-10-CM

## 2019-01-17 DIAGNOSIS — E113311 Type 2 diabetes mellitus with moderate nonproliferative diabetic retinopathy with macular edema, right eye: Secondary | ICD-10-CM

## 2019-01-17 DIAGNOSIS — H43813 Vitreous degeneration, bilateral: Secondary | ICD-10-CM

## 2019-01-17 DIAGNOSIS — H35033 Hypertensive retinopathy, bilateral: Secondary | ICD-10-CM

## 2019-01-17 DIAGNOSIS — I10 Essential (primary) hypertension: Secondary | ICD-10-CM | POA: Diagnosis not present

## 2019-01-17 DIAGNOSIS — E11311 Type 2 diabetes mellitus with unspecified diabetic retinopathy with macular edema: Secondary | ICD-10-CM

## 2019-01-17 DIAGNOSIS — H33302 Unspecified retinal break, left eye: Secondary | ICD-10-CM

## 2019-01-19 ENCOUNTER — Other Ambulatory Visit: Payer: Self-pay

## 2019-01-19 ENCOUNTER — Encounter: Payer: Self-pay | Admitting: Orthopedic Surgery

## 2019-01-19 ENCOUNTER — Ambulatory Visit (INDEPENDENT_AMBULATORY_CARE_PROVIDER_SITE_OTHER): Payer: Medicare Other | Admitting: Orthopedic Surgery

## 2019-01-19 VITALS — BP 110/50 | HR 90 | Ht 64.0 in | Wt 235.0 lb

## 2019-01-19 DIAGNOSIS — T84032D Mechanical loosening of internal right knee prosthetic joint, subsequent encounter: Secondary | ICD-10-CM

## 2019-01-19 NOTE — Progress Notes (Signed)
Office Visit Note   [name]           Date of Birth: 11/04/1945           MRN: [id number] Visit Date: 01/19/2019              Requested by: Lucianne Lei, [address] [place],  [place] [address] PCP: Lucianne Lei, MD   Chief Complaint:right knee pain.  HPI: Kaitlyn Jackson, 73 y.o. female, has a history of pain and functional disability in the right knee(s) due to failed previous arthroplasty and patient has failed non-surgical conservative treatments for greater than 12 weeks to include NSAID's and/or analgesics, use of assistive devices, weight reduction as appropriate and activity modification. The indications for the revision of the total knee arthroplasty are loosening of one or more components. Onset of symptoms was gradual starting 2 years ago with gradually worsening course since that time.  Prior procedures on the right knee(s) include arthroplasty.  Patient currently rates pain in the right knee(s) at 8 out of 10 with activity. There is night pain, worsening of pain with activity and weight bearing, pain that interferes with activities of daily living and joint swelling.  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 This condition presents safety issues increasing the risk of falls. This patient has had loosening of tibial plateau prosthesis.  There is no current active infection.  Patient Active Problem List   Diagnosis Date Noted  . 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 Sleep apnea 01/31/2018  . Atrophic vaginitis 01/31/2018  . Hyponatremia 01/31/2018  . Increased body mass index 01/31/2018  . Osteoarthritis 01/31/2018  . Postmenopausal bleeding 01/31/2018  . Systemic lupus erythematosus ([place]) 01/31/2018  . Uterine leiomyoma 01/31/2018  . Trigger middle finger of left hand 01/11/2017  . 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 High risk medication use 01/06/2016  . S/P TKR (total knee  replacement), bilateral 01/06/2016  . 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 Vitamin D deficiency 01/06/2016  . Deep venous thrombosis of lower extremity ([place]) 06/04/2015  . Osteopenia 03/14/2012  . Diabetes mellitus type 2 in obese ([place]) 03/14/2012  . 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 Hypertension 03/14/2012   Past Medical History:  Diagnosis Date  . Clotting disorder ([place])   . Cough 02/08/2017  . Diabetic retinopathy ([place])   . 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 Full dentures   . GERD (gastroesophageal reflux disease)   . Heart murmur   . History of blood clots 1996   groin  . History of glaucoma    had laser correction  . Hypertension    states under control with meds., has been on med. since 1996  . Insulin dependent diabetes mellitus   . Mitral valve prolapse   . Osteoarthritis    bilateral knee  . Seasonal allergies   . Sleep apnea    no CPAP use in several months, per pt.  . Trigger finger, left middle finger 01/2017    Past Surgical History:  Procedure Laterality Date  . BACK SURGERY     lower - removed  cysts  . CATARACT EXTRACTION W/ INTRAOCULAR LENS  IMPLANT, BILATERAL Bilateral   . GLAUCOMA SURGERY Bilateral    laser  . HEMILAMINOTOMY LUMBAR SPINE Right 01/30/2009   L5  . JOINT REPLACEMENT    . TOTAL KNEE ARTHROPLASTY Right 09/22/2007  . TOTAL  KNEE ARTHROPLASTY Left 02/01/2007  . TRIGGER FINGER RELEASE Right 12/14/2013   Procedure: RELEASE TRIGGER FINGER/A-1 PULLEY RIGHT RING FINGER;  Surgeon: Daryll Brod, MD;  Location: [facility];  Service: Orthopedics;  Laterality: Right;  . TRIGGER FINGER RELEASE Left 02/11/2017   Procedure: RELEASE TRIGGER FINGER/A-1 PULLEY;  Surgeon: Leanora Cover, MD;  Location: [facility];  Service: Orthopedics;  Laterality: Left;    No current facility-administered medications for this encounter.    Current Outpatient Medications  Medication Sig  Dispense Refill Last Dose  . ACCU-CHEK AVIVA PLUS test strip CHECK BLOOD GLUCOSE TWICE A DAY  4 Taking  . acetaminophen (TYLENOL) 500 MG tablet Take 500-1,000 mg by mouth every 6 (six) hours as needed for mild pain.   Taking  . acetaminophen-codeine (TYLENOL #3) 300-30 MG tablet Take 1 tablet by mouth every 8 (eight) hours as needed for moderate pain. 15 tablet 0 Taking  . atorvastatin (LIPITOR) 20 MG tablet Take 10 mg by mouth at bedtime.   Taking  . azelastine (ASTELIN) 0.1 % nasal spray PLACE 2 SPRAYS INTO BOTH NOSTRILS 2 (TWO) TIMES DAILY. 30 mL 5 Taking  . benazepril (LOTENSIN) 40 MG tablet Take 40 mg by mouth every morning.    Taking  . BESIVANCE 0.6 % SUSP INSTILL ONE DROP INTO RIGHT EYE 4 TIMES A DAY FOR 2 DAYS AFTER EACH MONTHLY EYE INJECTION   Taking  . Blood Glucose Monitoring Suppl (GLUCOCOM BLOOD GLUCOSE MONITOR) DEVI Accu-Chek Aviva Plus Meter   Taking  . cholecalciferol (VITAMIN D) 1000 UNITS tablet Take 1,000 Units by mouth at bedtime.    Taking  . cyclobenzaprine (FLEXERIL) 10 MG tablet Take 1 tablet (10 mg total) by mouth 2 (two) times daily as needed for muscle spasms. 20 tablet 0 Taking  . desonide (DESOWEN) 0.05 % lotion Apply 1 application topically 2 (two) times daily as needed (rash).    Taking  . diclofenac sodium (VOLTAREN) 1 % GEL Apply 2 g topically 4 (four) times daily. 1 Tube 0 Taking  . esomeprazole (NEXIUM) 40 MG capsule Take 40 mg by mouth daily before breakfast.   Taking  . fexofenadine (ALLEGRA) 180 MG tablet Take 180 mg by mouth daily as needed for allergies.    Taking  . fluticasone (FLONASE) 50 MCG/ACT nasal spray Place 1 spray into both nostrils daily as needed for allergies.    Taking  . folic acid (FOLVITE) 1 MG tablet Take 1 mg by mouth daily.   Taking  . furosemide (LASIX) 40 MG tablet Take 40 mg by mouth daily.   Taking  . HUMALOG MIX 75/25 KWIKPEN (75-25) 100 UNIT/ML Kwikpen INJECT 58 UNITS EVERY MORNING   Taking  . HYDROcodone-acetaminophen  (NORCO/VICODIN) 5-325 MG tablet Take 1 tablet by mouth 4 (four) times daily as needed. 6 tablet 0 Taking  . insulin NPH-insulin regular (NOVOLIN 70/30) (70-30) 100 UNIT/ML injection Inject 4-5 Units into the skin See admin instructions. Check blood sugar and take 4 to 5 units depending on blood sugar.   Taking  . Insulin Pen Needle (FIFTY50 PEN NEEDLES) 31G X 8 MM MISC BD Ultra-Fine Short Pen Needle 31 gauge x 5/16"   Taking  . Insulin Pen Needle (NOVOFINE PLUS) 32G X 4 MM MISC NovoFine Plus 32 gauge x 1/6" needle  USE 2 TIMES DAILY   Taking  . Insulin Pen Needle (NOVOFINE) 30G X 8 MM MISC NovoFine 30 30 gauge x 1/3" needle   Taking  . Lancet Devices ([employer]  LANCING DEVICE) MISC Accu-Chek FastClix Lancing Device   Taking  . metFORMIN (GLUCOPHAGE) 1000 MG tablet Take 1,000 mg by mouth at bedtime.  3 Taking  . montelukast (SINGULAIR) 10 MG tablet Take 10 mg by mouth at bedtime.   Taking  . pregabalin (LYRICA) 100 MG capsule Take 100 mg by mouth 2 (two) times daily.   Taking  . sitaGLIPtin-metformin (JANUMET) 50-1000 MG tablet Take 1 tablet by mouth every morning.    Taking  . Sulfacetamide Sodium (SODIUM SULFACETAMIDE EX) Apply 1 application topically every 6 (six) hours as needed (for itching).    Taking  . verapamil (COVERA HS) 180 MG (CO) 24 hr tablet Take 180 mg by mouth 2 (two) times daily.   Taking  . vitamin C (ASCORBIC ACID) 500 MG tablet Take 500 mg by mouth every morning.   Taking  . warfarin (COUMADIN) 10 MG tablet Take 10 mg by mouth See admin instructions. Take 1 tablet by mouth every other day, alternating with 7.5mg  tablet  3 Taking  . warfarin (COUMADIN) 7.5 MG tablet Take 7.5 mg by mouth See admin instructions. Take 1 tablet by mouth every other day alternating with 10mg  tablet  3 Taking   No Known Allergies  Social History   Tobacco Use  . Smoking status: Never Smoker  . Smokeless tobacco: Never Used  Substance Use Topics  . Alcohol use: No    Family History  Problem Relation  Age of Onset  . Cancer Mother        colon  . Cancer Cousin        lung      Review of Systems  Constitutional: Negative for fatigue.  HENT: Negative for ear pain.   Eyes: Negative for pain.  Respiratory: Negative for shortness of breath.   Cardiovascular: Negative for leg swelling.  Gastrointestinal: Negative for constipation and diarrhea.  Endocrine: Negative for cold intolerance and heat intolerance.  Genitourinary: Negative for difficulty urinating.  Musculoskeletal: Negative for joint swelling.  Skin: Negative for rash.  Allergic/Immunologic: Negative for food allergies.  Neurological: Negative for weakness.  Hematological: Does not bruise/bleed easily.  Psychiatric/Behavioral: Negative for sleep disturbance.   Objective:  Physical Exam  Constitutional: She is oriented to person, place, and time. She appears well-developed and well-nourished.  HENT:  Head: Normocephalic and atraumatic.  Eyes: Pupils are equal, round, and reactive to light. Conjunctivae and EOM are normal.  Neck: Neck supple.  Cardiovascular: Normal rate, regular rhythm, normal heart sounds and intact distal pulses.  Decrease pulses but present  Respiratory: Effort normal and breath sounds normal. No respiratory distress. She has no wheezes.  GI: Soft. Bowel sounds are normal. There is no abdominal tenderness.  Musculoskeletal:        General: Tenderness (proximal tibia) and edema present.  Neurological: She is alert and oriented to person, place, and time.  Skin: Skin is warm and dry.  Psychiatric: She has a normal mood and affect. Her behavior is normal. Judgment and thought content normal.  Musculoskeletal:  ROM 3-105 degrees,  Mild effusion without  Warmth or erythema.   Vital signs in last 24 hours: Weight:  [106.6 kg] 106.6 kg (11/05 1114) BP: 110/50   Pulse 90  Labs:  Estimated body mass index is 40.34 kg/m as calculated from the following:   Height as of 01/19/19: 5\' 4"  (1.626 m).    Weight as of 01/19/19: 106.6 kg.  Imaging Review Plain radiographs demonstrates left knee(s) with total knee replacement in place.. The  overall alignment is mild valgus.There is evidence of loosening of the tibial components. The bone quality appears to be good for age and reported activity level.   EXAM: NUCLEAR MEDICINE 3-PHASE BONE SCAN  TECHNIQUE: Radionuclide angiographic images, immediate static blood pool images, and 3-hour delayed static images were obtained of the knees after intravenous injection of radiopharmaceutical.  RADIOPHARMACEUTICALS:  20.0 mCi Tc-17m MDP IV  COMPARISON:  Knee radiograph 12/20/2018, three-phase bone scan 12/30/2016  FINDINGS: Vascular phase: Vascular phase was not captured due to technical error.  Blood pool phase: There is increased blood pool activity about the RIGHT knee prosthetic similar to comparison exam.  Delayed phase: There is delayed uptake localizing to the RIGHT tibial plateau and very similar to bone scan from 12/30/2016. Uptake in the LEFT tibial plateau is less intense and also similar to 2018.  IMPRESSION: 1. Blood pool and delayed increased uptake in the RIGHT knee prosthetic most intense in tibial plateau is similar to 2018 and could indicate aseptic loosening. 2. Uptake in the LEFT knee prosthetic is less than the RIGHT and also similar to comparison exam.    Assessment/Plan:   right knee(s) with failed previous arthroplasty.   The patient history, physical examination, clinical judgment of the provider and imaging studies are consistent with end stage degenerative joint disease of the right knee(s), previous total knee arthroplasty. Revision total knee arthroplasty is deemed medically necessary. The treatment options including medical management, injection therapy, arthroscopy and revision arthroplasty were discussed at length. The risks and benefits of revision total knee arthroplasty were presented and  reviewed. The risks due to aseptic loosening, infection, stiffness, patella tracking problems, thromboembolic complications and other imponderables were discussed. The patient acknowledged the explanation, agreed to proceed with the plan and consent was signed. Patient is being admitted for inpatient treatment for surgery, pain control, PT, OT, prophylactic antibiotics, VTE prophylaxis, progressive ambulation and ADL's and discharge planning.The patient is planning to be discharged home with home health services   Face-to-face time spent with patient was greater than 40 minutes.  Greater than 50% of the time was spent in counseling and coordination of care.  Mike Craze Micheal Likens [id number]  01/19/2019 12:13 PM

## 2019-01-19 NOTE — H&P (Addendum)
TOTAL KNEE REVISION ADMISSION H&P  Patient is being admitted for right revision total knee arthroplasty.  Subjective:  Chief Complaint:right knee pain.  HPI: Kaitlyn Jackson, 73 y.o. female, has a history of pain and functional disability in the right knee(s) due to failed previous arthroplasty and patient has failed non-surgical conservative treatments for greater than 12 weeks to include NSAID's and/or analgesics, use of assistive devices, weight reduction as appropriate and activity modification. The indications for the revision of the total knee arthroplasty are loosening of one or more components. Onset of symptoms was gradual starting 2 years ago with gradually worsening course since that time.  Prior procedures on the right knee(s) include arthroplasty.  Patient currently rates pain in the right knee(s) at 8 out of 10 with activity. There is night pain, worsening of pain with activity and weight bearing, pain that interferes with activities of daily living and joint swelling.  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 This condition presents safety issues increasing the risk of falls. This patient has had loosening of tibial plateau prosthesis.  There is no current active infection.  Patient Active Problem List   Diagnosis Date Noted  . 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 Sleep apnea 01/31/2018  . Atrophic vaginitis 01/31/2018  . Hyponatremia 01/31/2018  . Increased body mass index 01/31/2018  . Osteoarthritis 01/31/2018  . Postmenopausal bleeding 01/31/2018  . Systemic lupus erythematosus ([place]) 01/31/2018  . Uterine leiomyoma 01/31/2018  . Trigger middle finger of left hand 01/11/2017  . 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 High risk medication use 01/06/2016  . 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 Vitamin D deficiency 01/06/2016  . 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 Osteopenia 03/14/2012  . Diabetes mellitus type 2 in obese ([place]) 03/14/2012  . 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 Hypertension 03/14/2012   Past Medical History:  Diagnosis Date  . Clotting disorder ([place])   . Cough 02/08/2017  . Diabetic retinopathy ([place])   . 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 Full dentures   . GERD (gastroesophageal reflux disease)   . Heart murmur   . History of blood clots 1996   groin  . History of glaucoma    had laser correction  . Hypertension    states under control with meds., has been on med. since 1996  . Insulin dependent diabetes mellitus   . Mitral valve prolapse   . Osteoarthritis    bilateral knee  . Seasonal allergies   . Sleep apnea    no CPAP use in several months, per pt.  . Trigger finger, left middle finger 01/2017    Past Surgical History:  Procedure Laterality Date  . BACK SURGERY     lower - removed  cysts  . CATARACT EXTRACTION W/ INTRAOCULAR LENS  IMPLANT, BILATERAL Bilateral   . GLAUCOMA SURGERY Bilateral    laser  . HEMILAMINOTOMY LUMBAR SPINE Right 01/30/2009   L5  . JOINT REPLACEMENT    . TOTAL KNEE ARTHROPLASTY Right 09/22/2007  . TOTAL KNEE ARTHROPLASTY Left 02/01/2007  . TRIGGER FINGER RELEASE Right 12/14/2013   Procedure: RELEASE TRIGGER FINGER/A-1 PULLEY RIGHT RING FINGER;  Surgeon: Daryll Brod, MD;  Location: [facility];  Service: Orthopedics;  Laterality: Right;  . TRIGGER FINGER RELEASE Left 02/11/2017   Procedure: RELEASE TRIGGER FINGER/A-1 PULLEY;  Surgeon:  Leanora Cover, MD;  Location: [facility];  Service: Orthopedics;  Laterality: Left;    No current facility-administered medications for this encounter.    Current Outpatient Medications  Medication Sig Dispense Refill Last Dose  . ACCU-CHEK AVIVA PLUS test strip CHECK BLOOD GLUCOSE TWICE A DAY  4 Taking  . acetaminophen (TYLENOL) 500 MG  tablet Take 500-1,000 mg by mouth every 6 (six) hours as needed for mild pain.   Taking  . acetaminophen-codeine (TYLENOL #3) 300-30 MG tablet Take 1 tablet by mouth every 8 (eight) hours as needed for moderate pain. 15 tablet 0 Taking  . atorvastatin (LIPITOR) 20 MG tablet Take 10 mg by mouth at bedtime.   Taking  . azelastine (ASTELIN) 0.1 % nasal spray PLACE 2 SPRAYS INTO BOTH NOSTRILS 2 (TWO) TIMES DAILY. 30 mL 5 Taking  . benazepril (LOTENSIN) 40 MG tablet Take 40 mg by mouth every morning.    Taking  . BESIVANCE 0.6 % SUSP INSTILL ONE DROP INTO RIGHT EYE 4 TIMES A DAY FOR 2 DAYS AFTER EACH MONTHLY EYE INJECTION   Taking  . Blood Glucose Monitoring Suppl (GLUCOCOM BLOOD GLUCOSE MONITOR) DEVI Accu-Chek Aviva Plus Meter   Taking  . cholecalciferol (VITAMIN D) 1000 UNITS tablet Take 1,000 Units by mouth at bedtime.    Taking  . cyclobenzaprine (FLEXERIL) 10 MG tablet Take 1 tablet (10 mg total) by mouth 2 (two) times daily as needed for muscle spasms. 20 tablet 0 Taking  . desonide (DESOWEN) 0.05 % lotion Apply 1 application topically 2 (two) times daily as needed (rash).    Taking  . diclofenac sodium (VOLTAREN) 1 % GEL Apply 2 g topically 4 (four) times daily. 1 Tube 0 Taking  . esomeprazole (NEXIUM) 40 MG capsule Take 40 mg by mouth daily before breakfast.   Taking  . fexofenadine (ALLEGRA) 180 MG tablet Take 180 mg by mouth daily as needed for allergies.    Taking  . fluticasone (FLONASE) 50 MCG/ACT nasal spray Place 1 spray into both nostrils daily as needed for allergies.    Taking  . folic acid (FOLVITE) 1 MG tablet Take 1 mg by mouth daily.   Taking  . furosemide (LASIX) 40 MG tablet Take 40 mg by mouth daily.   Taking  . HUMALOG MIX 75/25 KWIKPEN (75-25) 100 UNIT/ML Kwikpen INJECT 72 UNITS EVERY MORNING   Taking  . HYDROcodone-acetaminophen (NORCO/VICODIN) 5-325 MG tablet Take 1 tablet by mouth 4 (four) times daily as needed. 6 tablet 0 Taking  . insulin NPH-insulin regular (NOVOLIN  70/30) (70-30) 100 UNIT/ML injection Inject 4-5 Units into the skin See admin instructions. Check blood sugar and take 4 to 5 units depending on blood sugar.   Taking  . Insulin Pen Needle (FIFTY50 PEN NEEDLES) 31G X 8 MM MISC BD Ultra-Fine Short Pen Needle 31 gauge x 5/16"   Taking  . Insulin Pen Needle (NOVOFINE PLUS) 32G X 4 MM MISC NovoFine Plus 32 gauge x 1/6" needle  USE 2 TIMES DAILY   Taking  . Insulin Pen Needle (NOVOFINE) 30G X 8 MM MISC NovoFine 30 30 gauge x 1/3" needle   Taking  . Lancet Devices ([employer] LANCING DEVICE) MISC Accu-Chek FastClix Lancing Device   Taking  . metFORMIN (GLUCOPHAGE) 1000 MG tablet Take 1,000 mg by mouth at bedtime.  3 Taking  . montelukast (SINGULAIR) 10 MG tablet Take 10 mg by mouth at bedtime.   Taking  . pregabalin (LYRICA) 100 MG capsule Take 100 mg  by mouth 2 (two) times daily.   Taking  . sitaGLIPtin-metformin (JANUMET) 50-1000 MG tablet Take 1 tablet by mouth every morning.    Taking  . Sulfacetamide Sodium (SODIUM SULFACETAMIDE EX) Apply 1 application topically every 6 (six) hours as needed (for itching).    Taking  . verapamil (COVERA HS) 180 MG (CO) 24 hr tablet Take 180 mg by mouth 2 (two) times daily.   Taking  . vitamin C (ASCORBIC ACID) 500 MG tablet Take 500 mg by mouth every morning.   Taking  . warfarin (COUMADIN) 10 MG tablet Take 10 mg by mouth See admin instructions. Take 1 tablet by mouth every other day, alternating with 7.5mg  tablet  3 Taking  . warfarin (COUMADIN) 7.5 MG tablet Take 7.5 mg by mouth See admin instructions. Take 1 tablet by mouth every other day alternating with 10mg  tablet  3 Taking   No Known Allergies  Social History   Tobacco Use  . Smoking status: Never Smoker  . Smokeless tobacco: Never Used  Substance Use Topics  . Alcohol use: No    Family History  Problem Relation Age of Onset  . Cancer Mother        colon  . Cancer Cousin        lung      Review of Systems  Constitutional: Negative for fatigue.   HENT: Negative for ear pain.   Eyes: Negative for pain.  Respiratory: Negative for shortness of breath.   Cardiovascular: Negative for leg swelling.  Gastrointestinal: Negative for constipation and diarrhea.  Endocrine: Negative for cold intolerance and heat intolerance.  Genitourinary: Negative for difficulty urinating.  Musculoskeletal: Negative for joint swelling.  Skin: Negative for rash.  Allergic/Immunologic: Negative for food allergies.  Neurological: Negative for weakness.  Hematological: Does not bruise/bleed easily.  Psychiatric/Behavioral: Negative for sleep disturbance.   Objective:  Physical Exam  Constitutional: She is oriented to person, place, and time. She appears well-developed and well-nourished.  HENT:  Head: Normocephalic and atraumatic.  Eyes: Pupils are equal, round, and reactive to light. Conjunctivae and EOM are normal.  Neck: Neck supple.  Cardiovascular: Normal rate, regular rhythm, normal heart sounds and intact distal pulses.  Decrease pulses but present  Respiratory: Effort normal and breath sounds normal. No respiratory distress. She has no wheezes.  GI: Soft. Bowel sounds are normal. There is no abdominal tenderness.  Musculoskeletal:        General: Tenderness (proximal tibia) and edema present.  Neurological: She is alert and oriented to person, place, and time.  Skin: Skin is warm and dry.  Psychiatric: She has a normal mood and affect. Her behavior is normal. Judgment and thought content normal.  Musculoskeletal:  ROM 3-105 degrees,  Mild effusion without  Warmth or erythema.   Vital signs in last 24 hours: Weight:  [106.6 kg] 106.6 kg (11/05 1114)  Labs:  Estimated body mass index is 40.34 kg/m as calculated from the following:   Height as of 01/19/19: 5\' 4"  (1.626 m).   Weight as of 01/19/19: 106.6 kg.  Imaging Review Plain radiographs demonstrates left knee(s) with total knee replacement in place.. The overall alignment is mild  valgus.There is evidence of loosening of the tibial components. The bone quality appears to be good for age and reported activity level.   EXAM: NUCLEAR MEDICINE 3-PHASE BONE SCAN  TECHNIQUE: Radionuclide angiographic images, immediate static blood pool images, and 3-hour delayed static images were obtained of the knees after intravenous injection of radiopharmaceutical.  RADIOPHARMACEUTICALS:  20.0 mCi Tc-36m MDP IV  COMPARISON:  Knee radiograph 12/20/2018, three-phase bone scan 12/30/2016  FINDINGS: Vascular phase: Vascular phase was not captured due to technical error.  Blood pool phase: There is increased blood pool activity about the RIGHT knee prosthetic similar to comparison exam.  Delayed phase: There is delayed uptake localizing to the RIGHT tibial plateau and very similar to bone scan from 12/30/2016. Uptake in the LEFT tibial plateau is less intense and also similar to 2018.  IMPRESSION: 1. Blood pool and delayed increased uptake in the RIGHT knee prosthetic most intense in tibial plateau is similar to 2018 and could indicate aseptic loosening. 2. Uptake in the LEFT knee prosthetic is less than the RIGHT and also similar to comparison exam.    Assessment/Plan:   right knee(s) with failed previous arthroplasty.   The patient history, physical examination, clinical judgment of the provider and imaging studies are consistent with end stage degenerative joint disease of the right knee(s), previous total knee arthroplasty. Revision total knee arthroplasty is deemed medically necessary. The treatment options including medical management, injection therapy, arthroscopy and revision arthroplasty were discussed at length. The risks and benefits of revision total knee arthroplasty were presented and reviewed. The risks due to aseptic loosening, infection, stiffness, patella tracking problems, thromboembolic complications and other imponderables were discussed. The  patient acknowledged the explanation, agreed to proceed with the plan and consent was signed. Patient is being admitted for inpatient treatment for surgery, pain control, PT, OT, prophylactic antibiotics, VTE prophylaxis, progressive ambulation and ADL's and discharge planning.The patient is planning to be discharged home with home health services   Mike Craze. Kaitlyn Jackson [id number]  01/19/2019 12:13 PM

## 2019-01-24 NOTE — Patient Instructions (Addendum)
DUE TO COVID-19 ONLY ONE VISITOR IS ALLOWED TO COME WITH YOU AND STAY IN THE WAITING ROOM ONLY DURING PRE OP AND PROCEDURE DAY OF SURGERY. THE 1 VISITOR MAY VISIT WITH YOU AFTER SURGERY IN YOUR PRIVATE ROOM DURING VISITING HOURS ONLY!  YOU NEED TO HAVE A COVID 19 TEST ON__11/13_____ @_______ , THIS TEST MUST BE DONE BEFORE SURGERY, COME  [place], [place]  , [id number].  ([place]) ONCE YOUR COVID TEST IS COMPLETED, PLEASE BEGIN THE QUARANTINE INSTRUCTIONS AS OUTLINED IN YOUR HANDOUT.                Kaitlyn Jackson   Your procedure is scheduled on: 01/31/19   Report to [facility] Main  Entrance   Report to Short Stay 5:30  AM     Call this number if you have problems the morning of surgery [place], NO CHEWING GUM CANDY OR MINTS.   Do not eat food After Midnight.   YOU MAY HAVE CLEAR LIQUIDS FROM MIDNIGHT UNTIL 4:30AM.   At 4:30AM Please finish the prescribed Pre-Surgery Gatorade drink  . Nothing by mouth after you finish the Gatorade drink !    Take these medicines the morning of surgery with A SIP OF WATER: Lyrica, Verapamil, Nexium, Flonase if needed  DO NOT TAKE ANY DIABETIC MEDICATIONS DAY OF YOUR SURGERY       How to Manage Your Diabetes Before and After Surgery  Why is it important to control my blood sugar before and after surgery? . Improving blood sugar levels before and after surgery helps healing and can limit problems. . A way of improving blood sugar control is eating a healthy diet by: o  Eating less sugar and carbohydrates o  Increasing activity/exercise o  Talking with your doctor about reaching your blood sugar goals . High blood sugars (greater than 180 mg/dL) can raise your risk of infections and slow your recovery, so you will need to focus on controlling your diabetes during the weeks before surgery. . Make sure that the doctor who takes care of  your diabetes knows about your planned surgery including the date and location.  How do I manage my blood sugar before surgery? . Check your blood sugar at least 4 times a day, starting 2 days before surgery, to make sure that the level is not too high or low. o Check your blood sugar the morning of your surgery when you wake up and every 2 hours until you get to the Short Stay unit. . If your blood sugar is less than 70 mg/dL, you will need to treat for low blood sugar: o Do not take insulin. o Treat a low blood sugar (less than 70 mg/dL) with  cup of clear juice (cranberry or apple), 4 glucose tablets, OR glucose gel. o Recheck blood sugar in 15 minutes after treatment (to make sure it is greater than 70 mg/dL). If your blood sugar is not greater than 70 mg/dL on recheck, call [phone] for further instructions. . Report your blood sugar to the short stay nurse when you get to Short Stay.  . If you are admitted to the hospital after surgery: o Your blood sugar will be checked by the staff and you will probably be given insulin after surgery (instead of oral diabetes medicines) to make sure you have good blood sugar levels. o The goal for blood sugar control  after surgery is 80-180 mg/dL.   WHAT DO I DO ABOUT MY DIABETES MEDICATION?  Marland Kitchen Do not take oral diabetes medicines (pills) the morning of surgery.  . THE NIGHT BEFORE SURGERY, take 35    units of  Humalog       insulin. (70% of usual dose)      . THE MORNING OF SURGERY, take0   units of  insulin.  . The day of surgery, do not take other diabetes injectables, including Byetta (exenatide), Bydureon (exenatide ER), Victoza (liraglutide), or Trulicity (dulaglutide).  . If your CBG is greater than 220 mg/dL, you may take  of your sliding scale  . (correction) dose of insulin.              You may not have any metal on your body including hair pins and              piercings             Do not wear jewelry, make-up, lotions,  powders or perfumes, deodorant             Do not wear nail polish on your fingernails.  Do not shave  48 hours prior to surgery.               Do not bring valuables to the hospital. [name].  Contacts, dentures or bridgework may not be worn into surgery.      Name and phone number of your driver:  Special Instructions: N/A              Please read over the following fact sheets you were given: _____________________________________________________________________             [facility] - Preparing for Surgery  Before surgery, you can play an important role.   Because skin is not sterile, your skin needs to be as free of germs as possible.   You can reduce the number of germs on your skin by washing with CHG (chlorahexidine gluconate) soap before surgery.   CHG is an antiseptic cleaner which kills germs and bonds with the skin to continue killing germs even after washing. Please DO NOT use if you have an allergy to CHG or antibacterial soaps.   If your skin becomes reddened/irritated stop using the CHG and inform your nurse when you arrive at Short Stay. Do not shave (including legs and underarms) for at least 48 hours prior to the first CHG shower.    Please follow these instructions carefully:  1.  Shower with CHG Soap the night before surgery and the  morning of Surgery.  2.  If you choose to wash your hair, wash your hair first as usual with your  normal  shampoo.  3.  After you shampoo, rinse your hair and body thoroughly to remove the  shampoo.                                        4.  Use CHG as you would any other liquid soap.  You can apply chg directly  to the skin and wash                       Gently with a scrungie or clean washcloth.  5.  Apply the CHG Soap to your body ONLY FROM THE NECK DOWN.   Do not use on face/ open                           Wound or open sores. Avoid contact with eyes, ears mouth and genitals  (private parts).                       Wash face,  Genitals (private parts) with your normal soap.             6.  Wash thoroughly, paying special attention to the area where your surgery  will be performed.  7.  Thoroughly rinse your body with warm water from the neck down.  8.  DO NOT shower/wash with your normal soap after using and rinsing off  the CHG Soap.            9.  Pat yourself dry with a clean towel.            10.  Wear clean pajamas.            11.  Place clean sheets on your bed the night of your first shower and do not  sleep with pets. Day of Surgery : Do not apply any lotions/deodorants the morning of surgery.  Please wear clean clothes to the hospital/surgery center.  FAILURE TO FOLLOW THESE INSTRUCTIONS MAY RESULT IN THE CANCELLATION OF YOUR SURGERY PATIENT SIGNATURE_________________________________  NURSE SIGNATURE__________________________________  ________________________________________________________________________   Adam Phenix  An incentive spirometer is a tool that can help keep your lungs clear and active. This tool measures how well you are filling your lungs with each breath. Taking long deep breaths may help reverse or decrease the chance of developing breathing (pulmonary) problems (especially infection) following:  A long period of time when you are unable to move or be active. BEFORE THE PROCEDURE   If the spirometer includes an indicator to show your best effort, your nurse or respiratory therapist will set it to a desired goal.  If possible, sit up straight or lean slightly forward. Try not to slouch.  Hold the incentive spirometer in an upright position. INSTRUCTIONS FOR USE  1. Sit on the edge of your bed if possible, or sit up as far as you can in bed or on a chair. 2. Hold the incentive spirometer in an upright position. 3. Breathe out normally. 4. Place the mouthpiece in your mouth and seal your lips tightly around  it. 5. Breathe in slowly and as deeply as possible, raising the piston or the ball toward the top of the column. 6. Hold your breath for 3-5 seconds or for as long as possible. Allow the piston or ball to fall to the bottom of the column. 7. Remove the mouthpiece from your mouth and breathe out normally. 8. Rest for a few seconds and repeat Steps 1 through 7 at least 10 times every 1-2 hours when you are awake. Take your time and take a few normal breaths between deep breaths. 9. The spirometer may include an indicator to show your best effort. Use the indicator as a goal to work toward during each repetition. 10. After each set of 10 deep breaths, practice coughing to be sure your lungs are clear. If you have an incision (the cut made at the time of surgery), support your incision when coughing by placing a pillow or rolled  up towels firmly against it. Once you are able to get out of bed, walk around indoors and cough well. You may stop using the incentive spirometer when instructed by your caregiver.  RISKS AND COMPLICATIONS  Take your time so you do not get dizzy or light-headed.  If you are in pain, you may need to take or ask for pain medication before doing incentive spirometry. It is harder to take a deep breath if you are having pain. AFTER USE  Rest and breathe slowly and easily.  It can be helpful to keep track of a log of your progress. Your caregiver can provide you with a simple table to help with this. If you are using the spirometer at home, follow these instructions: [place] IF:   You are having difficultly using the spirometer.  You have trouble using the spirometer as often as instructed.  Your pain medication is not giving enough relief while using the spirometer.  You develop fever of 100.5 F (38.1 C) or higher. SEEK IMMEDIATE MEDICAL CARE IF:   You cough up bloody sputum that had not been present before.  You develop fever of 102 F (38.9 C) or  greater.  You develop worsening pain at or near the incision site. MAKE SURE YOU:   Understand these instructions.  Will watch your condition.  Will get help right away if you are not doing well or get worse. Document Released: 07/13/2006 Document Revised: 05/25/2011 Document Reviewed: 09/13/2006 Exit[employer] Patient Information 2014 [employer], [place].   ________________________________________________________________________  WHAT IS A BLOOD TRANSFUSION? Blood Transfusion Information  A transfusion is the replacement of blood or some of its parts. Blood is made up of multiple cells which provide different functions.  Red blood cells carry oxygen and are used for blood loss replacement.  White blood cells fight against infection.  Platelets control bleeding.  Plasma helps clot blood.  Other blood products are available for specialized needs, such as hemophilia or other clotting disorders. BEFORE THE TRANSFUSION  Who gives blood for transfusions?   Healthy volunteers who are fully evaluated to make sure their blood is safe. This is blood bank blood. Transfusion therapy is the safest it has ever been in the practice of medicine. Before blood is taken from a donor, a complete history is taken to make sure that person has no history of diseases nor engages in risky social behavior (examples are intravenous drug use or sexual activity with multiple partners). The donor's travel history is screened to minimize risk of transmitting infections, such as malaria. The donated blood is tested for signs of infectious diseases, such as HIV and hepatitis. The blood is then tested to be sure it is compatible with you in order to minimize the chance of a transfusion reaction. If you or a relative donates blood, this is often done in anticipation of surgery and is not appropriate for emergency situations. It takes many days to process the donated blood. RISKS AND COMPLICATIONS Although transfusion therapy  is very safe and saves many lives, the main dangers of transfusion include:   Getting an infectious disease.  Developing a transfusion reaction. This is an allergic reaction to something in the blood you were given. Every precaution is taken to prevent this. The decision to have a blood transfusion has been considered carefully by your caregiver before blood is given. Blood is not given unless the benefits outweigh the risks. AFTER THE TRANSFUSION  Right after receiving a blood transfusion, you will usually feel  much better and more energetic. This is especially true if your red blood cells have gotten low (anemic). The transfusion raises the level of the red blood cells which carry oxygen, and this usually causes an energy increase.  The nurse administering the transfusion will monitor you carefully for complications. HOME CARE INSTRUCTIONS  No special instructions are needed after a transfusion. You may find your energy is better. Speak with your caregiver about any limitations on activity for underlying diseases you may have. SEEK MEDICAL CARE IF:   Your condition is not improving after your transfusion.  You develop redness or irritation at the intravenous (IV) site. SEEK IMMEDIATE MEDICAL CARE IF:  Any of the following symptoms occur over the next 12 hours:  Shaking chills.  You have a temperature by mouth above 102 F (38.9 C), not controlled by medicine.  Chest, back, or muscle pain.  People around you feel you are not acting correctly or are confused.  Shortness of breath or difficulty breathing.  Dizziness and fainting.  You get a rash or develop hives.  You have a decrease in urine output.  Your urine turns a dark color or changes to pink, red, or brown. Any of the following symptoms occur over the next 10 days:  You have a temperature by mouth above 102 F (38.9 C), not controlled by medicine.  Shortness of breath.  Weakness after normal activity.  The white  part of the eye turns yellow (jaundice).  You have a decrease in the amount of urine or are urinating less often.  Your urine turns a dark color or changes to pink, red, or brown. Document Released: 02/28/2000 Document Revised: 05/25/2011 Document Reviewed: 10/17/2007 [facility] Patient Information 2014 [place], [place].  _______________________________________________________________________

## 2019-01-25 ENCOUNTER — Other Ambulatory Visit: Payer: Self-pay

## 2019-01-25 ENCOUNTER — Encounter (HOSPITAL_COMMUNITY)
Admission: RE | Admit: 2019-01-25 | Discharge: 2019-01-25 | Disposition: A | Discharge status: Home / Self Care | Payer: Medicare Other | Source: Ambulatory Visit | Attending: Orthopaedic Surgery | Admitting: Orthopaedic Surgery

## 2019-01-25 ENCOUNTER — Ambulatory Visit (HOSPITAL_COMMUNITY)
Admission: RE | Admit: 2019-01-25 | Discharge: 2019-01-25 | Disposition: A | Discharge status: Home / Self Care | Payer: Medicare Other | Source: Ambulatory Visit | Attending: Orthopedic Surgery | Admitting: Orthopedic Surgery

## 2019-01-25 ENCOUNTER — Encounter (HOSPITAL_COMMUNITY): Payer: Self-pay

## 2019-01-25 DIAGNOSIS — Z79899 Other long term (current) drug therapy: Secondary | ICD-10-CM | POA: Diagnosis not present

## 2019-01-25 DIAGNOSIS — Z9841 Cataract extraction status, right eye: Secondary | ICD-10-CM | POA: Diagnosis not present

## 2019-01-25 DIAGNOSIS — I10 Essential (primary) hypertension: Secondary | ICD-10-CM | POA: Insufficient documentation

## 2019-01-25 DIAGNOSIS — R918 Other nonspecific abnormal finding of lung field: Secondary | ICD-10-CM | POA: Diagnosis not present

## 2019-01-25 DIAGNOSIS — Z794 Long term (current) use of insulin: Secondary | ICD-10-CM | POA: Diagnosis not present

## 2019-01-25 DIAGNOSIS — Z01818 Encounter for other preprocedural examination: Secondary | ICD-10-CM | POA: Insufficient documentation

## 2019-01-25 DIAGNOSIS — G4730 Sleep apnea, unspecified: Secondary | ICD-10-CM | POA: Diagnosis not present

## 2019-01-25 DIAGNOSIS — Z961 Presence of intraocular lens: Secondary | ICD-10-CM | POA: Diagnosis not present

## 2019-01-25 DIAGNOSIS — E11319 Type 2 diabetes mellitus with unspecified diabetic retinopathy without macular edema: Secondary | ICD-10-CM | POA: Diagnosis not present

## 2019-01-25 DIAGNOSIS — K219 Gastro-esophageal reflux disease without esophagitis: Secondary | ICD-10-CM | POA: Insufficient documentation

## 2019-01-25 DIAGNOSIS — Z9842 Cataract extraction status, left eye: Secondary | ICD-10-CM | POA: Diagnosis not present

## 2019-01-25 DIAGNOSIS — Y838 Other surgical procedures as the cause of abnormal reaction of the patient, or of later complication, without mention of misadventure at the time of the procedure: Secondary | ICD-10-CM | POA: Insufficient documentation

## 2019-01-25 DIAGNOSIS — T84092A Other mechanical complication of internal right knee prosthesis, initial encounter: Secondary | ICD-10-CM | POA: Insufficient documentation

## 2019-01-25 DIAGNOSIS — Z7901 Long term (current) use of anticoagulants: Secondary | ICD-10-CM | POA: Diagnosis not present

## 2019-01-25 DIAGNOSIS — Z96652 Presence of left artificial knee joint: Secondary | ICD-10-CM | POA: Insufficient documentation

## 2019-01-25 LAB — URINALYSIS, ROUTINE W REFLEX MICROSCOPIC
Bilirubin Urine: NEGATIVE
Glucose, UA: NEGATIVE mg/dL
Hgb urine dipstick: NEGATIVE
Ketones, ur: NEGATIVE mg/dL
Nitrite: NEGATIVE
Protein, ur: NEGATIVE mg/dL
Specific Gravity, Urine: 1.025 (ref 1.005–1.030)
pH: 5 (ref 5.0–8.0)

## 2019-01-25 LAB — SURGICAL PCR SCREEN
MRSA, PCR: NEGATIVE
Staphylococcus aureus: NEGATIVE

## 2019-01-25 LAB — COMPREHENSIVE METABOLIC PANEL
ALT: 17 U/L (ref 0–44)
AST: 18 U/L (ref 15–41)
Albumin: 3.4 g/dL — ABNORMAL LOW (ref 3.5–5.0)
Alkaline Phosphatase: 91 U/L (ref 38–126)
Anion gap: 9 (ref 5–15)
BUN: 23 mg/dL (ref 8–23)
CO2: 25 mmol/L (ref 22–32)
Calcium: 8.7 mg/dL — ABNORMAL LOW (ref 8.9–10.3)
Chloride: 104 mmol/L (ref 98–111)
Creatinine, Ser: 0.92 mg/dL (ref 0.44–1.00)
GFR calc Af Amer: >60 mL/min (ref >60)
GFR calc non Af Amer: >60 mL/min (ref >60)
Glucose, Bld: 172 mg/dL — ABNORMAL HIGH (ref 70–99)
Potassium: 4.3 mmol/L (ref 3.5–5.1)
Sodium: 138 mmol/L (ref 135–145)
Total Bilirubin: 0.8 mg/dL (ref 0.3–1.2)
Total Protein: 7 g/dL (ref 6.5–8.1)

## 2019-01-25 LAB — CBC WITH DIFFERENTIAL/PLATELET
Abs Immature Granulocytes: 0.03 10*3/uL (ref 0.00–0.07)
Basophils Absolute: 0 10*3/uL (ref 0.0–0.1)
Basophils Relative: 0 %
Eosinophils Absolute: 0.1 10*3/uL (ref 0.0–0.5)
Eosinophils Relative: 1 %
HCT: 38 % (ref 36.0–46.0)
Hemoglobin: 11.6 g/dL — ABNORMAL LOW (ref 12.0–15.0)
Immature Granulocytes: 0 %
Lymphocytes Relative: 31 %
Lymphs Abs: 2.8 10*3/uL (ref 0.7–4.0)
MCH: 27.4 pg (ref 26.0–34.0)
MCHC: 30.5 g/dL (ref 30.0–36.0)
MCV: 89.6 fL (ref 80.0–100.0)
Monocytes Absolute: 0.7 10*3/uL (ref 0.1–1.0)
Monocytes Relative: 7 %
Neutro Abs: 5.6 10*3/uL (ref 1.7–7.7)
Neutrophils Relative %: 61 %
Platelets: 403 10*3/uL — ABNORMAL HIGH (ref 150–400)
RBC: 4.24 MIL/uL (ref 3.87–5.11)
RDW: 16.9 % — ABNORMAL HIGH (ref 11.5–15.5)
WBC: 9.3 10*3/uL (ref 4.0–10.5)
nRBC: 0 % (ref 0.0–0.2)

## 2019-01-25 LAB — GLUCOSE, CAPILLARY: Glucose-Capillary: 195 mg/dL — ABNORMAL HIGH (ref 70–99)

## 2019-01-25 LAB — APTT: aPTT: 55 seconds — ABNORMAL HIGH (ref 24–36)

## 2019-01-25 LAB — PROTIME-INR
INR: 3.8 — ABNORMAL HIGH (ref 0.8–1.2)
Prothrombin Time: 37.2 seconds — ABNORMAL HIGH (ref 11.4–15.2)

## 2019-01-25 LAB — ABO/RH: ABO/RH(D): B POS

## 2019-01-25 NOTE — Progress Notes (Signed)
PCP - Dr. Lucianne Lei Cardiologist - none  Chest x-ray - 01/25/2019 EKG -01/25/2019  Stress Test - many years ago  When lived in [place] around 1996 ECHO - many years ago when lived in [place] around [place] years ago when lived in [place] around [place] - 03/15/2011 referred by Dr. Grant Fontana CPAP - yes  Fasting Blood Sugar - 200's Checks Blood Sugar __2-3___ times a day  Blood Thinner Instructions:Coumadin, instructions given to patient by Dr. Criss Rosales to Stop Coumadin on 01/26/2019 Aspirin Instructions:none Last Dose: to be on 01/26/2019  Anesthesia review:   Chart given to Konrad Felix, PA to review.  Patient has a history of DVT , clotting disorder (in 1996 put on Coumadin and physician tried to get her off it in 1997 and she was told her blood started thickening and she had to stay on the Coumadin), Mitral Valve Prolapse,heart murmur, HTN, DM, and Sleep apnea.  Patient denies shortness of breath, fever, cough and chest pain at PAT appointment   Patient verbalized understanding of instructions that were given to them at the PAT appointment. Patient was also instructed that they will need to review over the PAT instructions again at home before surgery.

## 2019-01-25 NOTE — Patient Instructions (Addendum)
DUE TO COVID-19 ONLY ONE VISITOR IS ALLOWED TO COME WITH YOU AND STAY IN THE WAITING ROOM ONLY DURING PRE OP AND PROCEDURE DAY OF SURGERY. THE 1 VISITOR MAY VISIT WITH YOU AFTER SURGERY IN YOUR PRIVATE ROOM DURING VISITING HOURS ONLY!  YOU NEED TO HAVE A COVID 19 TEST ON_Friday 11/13/2020______ @___130  pm____, THIS TEST MUST BE DONE BEFORE SURGERY, COME  [place] , [id number].  ([place]) ONCE YOUR COVID TEST IS COMPLETED, PLEASE BEGIN THE QUARANTINE INSTRUCTIONS AS OUTLINED IN YOUR HANDOUT.                SHANIESE CASCINO     Your procedure is scheduled on: Tuesday 01/31/2019   Report to [facility] Main  Entrance    Report to Short Stay at 0530 AM    How to Manage Your Diabetes Before and After Surgery  Why is it important to control my blood sugar before and after surgery? . Improving blood sugar levels before and after surgery helps healing and can limit problems. . A way of improving blood sugar control is eating a healthy diet by: o  Eating less sugar and carbohydrates o  Increasing activity/exercise o  Talking with your doctor about reaching your blood sugar goals . High blood sugars (greater than 180 mg/dL) can raise your risk of infections and slow your recovery, so you will need to focus on controlling your diabetes during the weeks before surgery. . Make sure that the doctor who takes care of your diabetes knows about your planned surgery including the date and location.  How do I manage my blood sugar before surgery? . Check your blood sugar at least 4 times a day, starting 2 days before surgery, to make sure that the level is not too high or low. o Check your blood sugar the morning of your surgery when you wake up and every 2 hours until you get to the Short Stay unit. . If your blood sugar is less than 70 mg/dL, you will need to treat for low blood sugar: o Do not take insulin. o Treat a low blood sugar (less than 70  mg/dL) with  cup of clear juice (cranberry or apple), 4 glucose tablets, OR glucose gel. o Recheck blood sugar in 15 minutes after treatment (to make sure it is greater than 70 mg/dL). If your blood sugar is not greater than 70 mg/dL on recheck, call [phone] for further instructions. . Report your blood sugar to the short stay nurse when you get to Short Stay.  . If you are admitted to the hospital after surgery: o Your blood sugar will be checked by the staff and you will probably be given insulin after surgery (instead of oral diabetes medicines) to make sure you have good blood sugar levels. o The goal for blood sugar control after surgery is 80-180 mg/dL.   WHAT DO I DO ABOUT MY DIABETES MEDICATION?        The day before surgery, Take Metformin (Glucophage) as prescribed.         The day before surgery, Take Sitagliptin/Metformin (Janumet) as prescribed.          . Do not take oral diabetes medicines (pills) the morning of surgery.  . THE Day  BEFORE SURGERY, take  35   units of  Humalog 75/25 mix     insulin.       . THE MORNING OF SURGERY, take   0  units of    Humalog 75/25 mix     insulin.      Call this number if you have problems the morning of surgery [phone]    Remember: Do not eat food :After Midnight.     NO SOLID FOOD AFTER MIDNIGHT THE NIGHT PRIOR TO SURGERY. NOTHING BY MOUTH EXCEPT CLEAR LIQUIDS UNTIL 0415 am .     PLEASE FINISH Gatorade G 2  DRINK PER SURGEON ORDER  WHICH NEEDS TO BE COMPLETED AT 0415 am .   CLEAR LIQUID DIET   Foods Allowed                                                                     Foods Excluded  Coffee and tea, regular and decaf                             liquids that you cannot  Plain Jell-O any favor except red or purple                                           see through such as: Fruit ices (not with fruit pulp)                                     milk, soups, orange juice  Iced Popsicles                                     All solid food Carbonated beverages, regular and diet                                    Cranberry, grape and apple juices Sports drinks like Gatorade Lightly seasoned clear broth or consume(fat free) Sugar, honey syrup  Sample Menu Breakfast                                Lunch                                     Supper Cranberry juice                    Beef broth                            Chicken broth Jell-O                                     Grape juice                           Apple juice  Coffee or tea                        Jell-O                                      Popsicle                                                Coffee or tea                        Coffee or tea  _____________________________________________________________________    BRUSH YOUR TEETH MORNING OF SURGERY AND RINSE YOUR MOUTH OUT, NO CHEWING GUM CANDY OR MINTS.     Take these medicines the morning of surgery with A SIP OF WATER: Pregabalin (Lyrica), Verapamil (Calan-SR), Esomeprazole (Nexium), use Flonase if needed   DO NOT TAKE ANY DIABETIC MEDICATIONS DAY OF YOUR SURGERY!                               You may not have any metal on your body including hair pins and              piercings  Do not wear jewelry, make-up, lotions, powders or perfumes, deodorant             Do not wear nail polish on your fingernails.  Do not shave  48 hours prior to surgery.                Do not bring valuables to the hospital. [place].  Contacts, dentures or bridgework may not be worn into surgery.  Leave suitcase in the car. After surgery it may be brought to your room.                  Please read over the following fact sheets you were given: _____________________________________________________________________             [facility] - Preparing for Surgery Before surgery, you can play an important role.  Because skin is not sterile, your  skin needs to be as free of germs as possible.  You can reduce the number of germs on your skin by washing with CHG (chlorahexidine gluconate) soap before surgery.  CHG is an antiseptic cleaner which kills germs and bonds with the skin to continue killing germs even after washing. Please DO NOT use if you have an allergy to CHG or antibacterial soaps.  If your skin becomes reddened/irritated stop using the CHG and inform your nurse when you arrive at Short Stay. Do not shave (including legs and underarms) for at least 48 hours prior to the first CHG shower.  You may shave your face/neck. Please follow these instructions carefully:  1.  Shower with CHG Soap the night before surgery and the  morning of Surgery.  2.  If you choose to wash your hair, wash your hair first as usual with your  normal  shampoo.  3.  After you shampoo, rinse your hair and body thoroughly to remove the  shampoo.  4.  Use CHG as you would any other liquid soap.  You can apply chg directly  to the skin and wash                       Gently with a scrungie or clean washcloth.  5.  Apply the CHG Soap to your body ONLY FROM THE NECK DOWN.   Do not use on face/ open                           Wound or open sores. Avoid contact with eyes, ears mouth and genitals (private parts).                       Wash face,  Genitals (private parts) with your normal soap.             6.  Wash thoroughly, paying special attention to the area where your surgery  will be performed.  7.  Thoroughly rinse your body with warm water from the neck down.  8.  DO NOT shower/wash with your normal soap after using and rinsing off  the CHG Soap.                9.  Pat yourself dry with a clean towel.            10.  Wear clean pajamas.            11.  Place clean sheets on your bed the night of your first shower and do not  sleep with pets. Day of Surgery : Do not apply any lotions/deodorants the morning of surgery.  Please wear  clean clothes to the hospital/surgery center.  FAILURE TO FOLLOW THESE INSTRUCTIONS MAY RESULT IN THE CANCELLATION OF YOUR SURGERY PATIENT SIGNATURE_________________________________  NURSE SIGNATURE__________________________________  ________________________________________________________________________   Adam Phenix  An incentive spirometer is a tool that can help keep your lungs clear and active. This tool measures how well you are filling your lungs with each breath. Taking long deep breaths may help reverse or decrease the chance of developing breathing (pulmonary) problems (especially infection) following:  A long period of time when you are unable to move or be active. BEFORE THE PROCEDURE   If the spirometer includes an indicator to show your best effort, your nurse or respiratory therapist will set it to a desired goal.  If possible, sit up straight or lean slightly forward. Try not to slouch.  Hold the incentive spirometer in an upright position. INSTRUCTIONS FOR USE  1. Sit on the edge of your bed if possible, or sit up as far as you can in bed or on a chair. 2. Hold the incentive spirometer in an upright position. 3. Breathe out normally. 4. Place the mouthpiece in your mouth and seal your lips tightly around it. 5. Breathe in slowly and as deeply as possible, raising the piston or the ball toward the top of the column. 6. Hold your breath for 3-5 seconds or for as long as possible. Allow the piston or ball to fall to the bottom of the column. 7. Remove the mouthpiece from your mouth and breathe out normally. 8. Rest for a few seconds and repeat Steps 1 through 7 at least 10 times every 1-2 hours when you are awake. Take your time and take a few normal breaths between deep breaths. 9. The spirometer may include an indicator to show  your best effort. Use the indicator as a goal to work toward during each repetition. 10. After each set of 10 deep breaths, practice  coughing to be sure your lungs are clear. If you have an incision (the cut made at the time of surgery), support your incision when coughing by placing a pillow or rolled up towels firmly against it. Once you are able to get out of bed, walk around indoors and cough well. You may stop using the incentive spirometer when instructed by your caregiver.  RISKS AND COMPLICATIONS  Take your time so you do not get dizzy or light-headed.  If you are in pain, you may need to take or ask for pain medication before doing incentive spirometry. It is harder to take a deep breath if you are having pain. AFTER USE  Rest and breathe slowly and easily.  It can be helpful to keep track of a log of your progress. Your caregiver can provide you with a simple table to help with this. If you are using the spirometer at home, follow these instructions: [place] IF:   You are having difficultly using the spirometer.  You have trouble using the spirometer as often as instructed.  Your pain medication is not giving enough relief while using the spirometer.  You develop fever of 100.5 F (38.1 C) or higher. SEEK IMMEDIATE MEDICAL CARE IF:   You cough up bloody sputum that had not been present before.  You develop fever of 102 F (38.9 C) or greater.  You develop worsening pain at or near the incision site. MAKE SURE YOU:   Understand these instructions.  Will watch your condition.  Will get help right away if you are not doing well or get worse. Document Released: 07/13/2006 Document Revised: 05/25/2011 Document Reviewed: 09/13/2006 ExitCare Patient Information 2014 Exit[employer], [place].   ________________________________________________________________________  WHAT IS A BLOOD TRANSFUSION? Blood Transfusion Information  A transfusion is the replacement of blood or some of its parts. Blood is made up of multiple cells which provide different functions.  Red blood cells carry oxygen and are  used for blood loss replacement.  White blood cells fight against infection.  Platelets control bleeding.  Plasma helps clot blood.  Other blood products are available for specialized needs, such as hemophilia or other clotting disorders. BEFORE THE TRANSFUSION  Who gives blood for transfusions?   Healthy volunteers who are fully evaluated to make sure their blood is safe. This is blood bank blood. Transfusion therapy is the safest it has ever been in the practice of medicine. Before blood is taken from a donor, a complete history is taken to make sure that person has no history of diseases nor engages in risky social behavior (examples are intravenous drug use or sexual activity with multiple partners). The donor's travel history is screened to minimize risk of transmitting infections, such as malaria. The donated blood is tested for signs of infectious diseases, such as HIV and hepatitis. The blood is then tested to be sure it is compatible with you in order to minimize the chance of a transfusion reaction. If you or a relative donates blood, this is often done in anticipation of surgery and is not appropriate for emergency situations. It takes many days to process the donated blood. RISKS AND COMPLICATIONS Although transfusion therapy is very safe and saves many lives, the main dangers of transfusion include:   Getting an infectious disease.  Developing a transfusion reaction. This is an allergic reaction to  something in the blood you were given. Every precaution is taken to prevent this. The decision to have a blood transfusion has been considered carefully by your caregiver before blood is given. Blood is not given unless the benefits outweigh the risks. AFTER THE TRANSFUSION  Right after receiving a blood transfusion, you will usually feel much better and more energetic. This is especially true if your red blood cells have gotten low (anemic). The transfusion raises the level of the red  blood cells which carry oxygen, and this usually causes an energy increase.  The nurse administering the transfusion will monitor you carefully for complications. HOME CARE INSTRUCTIONS  No special instructions are needed after a transfusion. You may find your energy is better. Speak with your caregiver about any limitations on activity for underlying diseases you may have. SEEK MEDICAL CARE IF:   Your condition is not improving after your transfusion.  You develop redness or irritation at the intravenous (IV) site. SEEK IMMEDIATE MEDICAL CARE IF:  Any of the following symptoms occur over the next 12 hours:  Shaking chills.  You have a temperature by mouth above 102 F (38.9 C), not controlled by medicine.  Chest, back, or muscle pain.  People around you feel you are not acting correctly or are confused.  Shortness of breath or difficulty breathing.  Dizziness and fainting.  You get a rash or develop hives.  You have a decrease in urine output.  Your urine turns a dark color or changes to pink, red, or brown. Any of the following symptoms occur over the next 10 days:  You have a temperature by mouth above 102 F (38.9 C), not controlled by medicine.  Shortness of breath.  Weakness after normal activity.  The white part of the eye turns yellow (jaundice).  You have a decrease in the amount of urine or are urinating less often.  Your urine turns a dark color or changes to pink, red, or brown. Document Released: 02/28/2000 Document Revised: 05/25/2011 Document Reviewed: 10/17/2007 [facility] Patient Information 2014 [place], [place].  _______________________________________________________________________

## 2019-01-26 ENCOUNTER — Inpatient Hospital Stay (HOSPITAL_COMMUNITY)
Admission: RE | Admit: 2019-01-26 | Discharge: 2019-01-26 | Disposition: A | Discharge status: Home / Self Care | Payer: Medicare Other | Source: Ambulatory Visit

## 2019-01-26 LAB — URINE CULTURE: Culture: NO GROWTH

## 2019-01-27 ENCOUNTER — Telehealth: Payer: Self-pay | Admitting: Orthopaedic Surgery

## 2019-01-27 ENCOUNTER — Other Ambulatory Visit (HOSPITAL_COMMUNITY): Payer: Self-pay | Admitting: *Deleted

## 2019-01-27 ENCOUNTER — Other Ambulatory Visit (HOSPITAL_COMMUNITY)
Admission: RE | Admit: 2019-01-27 | Discharge: 2019-01-27 | Disposition: A | Discharge status: Home / Self Care | Payer: Medicare Other | Source: Ambulatory Visit | Attending: Orthopaedic Surgery | Admitting: Orthopaedic Surgery

## 2019-01-27 DIAGNOSIS — Z20828 Contact with and (suspected) exposure to other viral communicable diseases: Secondary | ICD-10-CM | POA: Insufficient documentation

## 2019-01-27 DIAGNOSIS — Z01812 Encounter for preprocedural laboratory examination: Secondary | ICD-10-CM | POA: Diagnosis not present

## 2019-01-27 NOTE — Progress Notes (Signed)
James burns pa saw secure chat sent to review chart for anesthesia

## 2019-01-27 NOTE — Telephone Encounter (Signed)
Patient would like to know if she will have someone coming to her home for physical therapy. If so, when will this start?   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    Patient's best phone number [phone]

## 2019-01-29 LAB — NOVEL CORONAVIRUS, NAA (HOSP ORDER, SEND-OUT TO REF LAB; TAT 18-24 HRS): SARS-CoV-2, NAA: NOT DETECTED

## 2019-01-30 MED ORDER — TRANEXAMIC ACID 1000 MG/10ML IV SOLN
2000.0000 mg | INTRAVENOUS | Status: DC
  Filled 2019-01-30 (×3): qty 20

## 2019-01-30 NOTE — Telephone Encounter (Signed)
See below

## 2019-01-30 NOTE — Anesthesia Preprocedure Evaluation (Addendum)
Anesthesia Evaluation  Patient identified by MRN, date of birth, ID band Patient awake    Reviewed: Allergy & Precautions, NPO status , Patient's Chart, lab work & pertinent test results  Airway Mallampati: II  TM Distance: >3 FB Neck ROM: Full    Dental  (+) Edentulous Upper, Edentulous Lower   Pulmonary sleep apnea ,    breath sounds clear to auscultation       Cardiovascular hypertension, Pt. on medications + DVT   Rhythm:Regular Rate:Normal     Neuro/Psych negative neurological ROS     GI/Hepatic Neg liver ROS, GERD  ,  Endo/Other  diabetes, Type 2, Insulin DependentMorbid obesity  Renal/GU negative Renal ROS     Musculoskeletal  (+) Arthritis ,   Abdominal   Peds  Hematology  (+) anemia ,   Anesthesia Other Findings   Reproductive/Obstetrics                            Lab Results  Component Value Date   WBC 9.3 01/25/2019   HGB 11.6 (L) 01/25/2019   HCT 38.0 01/25/2019   MCV 89.6 01/25/2019   PLT 403 (H) 01/25/2019   Lab Results  Component Value Date   CREATININE 0.92 01/25/2019   BUN 23 01/25/2019   NA 138 01/25/2019   K 4.3 01/25/2019   CL 104 01/25/2019   CO2 25 01/25/2019   Lab Results  Component Value Date   INR 1.1 01/31/2019   INR 3.8 (H) 01/25/2019   INR 0.98 02/10/2017    Anesthesia Physical Anesthesia Plan  ASA: III  Anesthesia Plan: General   Post-op Pain Management:  Regional for Post-op pain   Induction: Intravenous  PONV Risk Score and Plan: 3 and Dexamethasone, Ondansetron and Treatment may vary due to age or medical condition  Airway Management Planned: Oral ETT  Additional Equipment:   Intra-op Plan:   Post-operative Plan: Extubation in OR  Informed Consent: I have reviewed the patients History and Physical, chart, labs and discussed the procedure including the risks, benefits and alternatives for the proposed anesthesia with the  patient or authorized representative who has indicated his/her understanding and acceptance.     Dental advisory given  Plan Discussed with: CRNA  Anesthesia Plan Comments:        Anesthesia Quick Evaluation

## 2019-01-30 NOTE — Telephone Encounter (Signed)
Please advise. Will this get set up while in the hospital?

## 2019-01-30 NOTE — Telephone Encounter (Signed)
Think so-sometimes Kaitlyn Jackson will schedule pre op-please check with her

## 2019-01-30 NOTE — Progress Notes (Signed)
Chart to [place] zanetto pa for revoew

## 2019-01-31 ENCOUNTER — Other Ambulatory Visit: Payer: Self-pay

## 2019-01-31 ENCOUNTER — Encounter (HOSPITAL_COMMUNITY)
Admission: RE | Disposition: A | Discharge status: Home Health | Payer: Self-pay | Source: Home / Self Care | Attending: Orthopaedic Surgery

## 2019-01-31 ENCOUNTER — Inpatient Hospital Stay (HOSPITAL_COMMUNITY)
Admission: RE | Admit: 2019-01-31 | Discharge: 2019-02-02 | DRG: 467 | Disposition: A | Discharge status: Home Health | Payer: Medicare Other | Attending: Orthopaedic Surgery | Admitting: Orthopaedic Surgery

## 2019-01-31 ENCOUNTER — Encounter (HOSPITAL_COMMUNITY): Payer: Self-pay | Admitting: *Deleted

## 2019-01-31 ENCOUNTER — Inpatient Hospital Stay (HOSPITAL_COMMUNITY): Payer: Medicare Other

## 2019-01-31 ENCOUNTER — Inpatient Hospital Stay (HOSPITAL_COMMUNITY): Payer: Medicare Other | Admitting: Physician Assistant

## 2019-01-31 DIAGNOSIS — M96671 Fracture of tibia or fibula following insertion of orthopedic implant, joint prosthesis, or bone plate, right leg: Secondary | ICD-10-CM | POA: Diagnosis not present

## 2019-01-31 DIAGNOSIS — M8580 Other specified disorders of bone density and structure, unspecified site: Secondary | ICD-10-CM | POA: Diagnosis present

## 2019-01-31 DIAGNOSIS — Z743 Need for continuous supervision: Secondary | ICD-10-CM | POA: Diagnosis not present

## 2019-01-31 DIAGNOSIS — M329 Systemic lupus erythematosus, unspecified: Secondary | ICD-10-CM | POA: Diagnosis not present

## 2019-01-31 DIAGNOSIS — G8918 Other acute postprocedural pain: Secondary | ICD-10-CM | POA: Diagnosis not present

## 2019-01-31 DIAGNOSIS — I341 Nonrheumatic mitral (valve) prolapse: Secondary | ICD-10-CM | POA: Diagnosis present

## 2019-01-31 DIAGNOSIS — E11319 Type 2 diabetes mellitus with unspecified diabetic retinopathy without macular edema: Secondary | ICD-10-CM | POA: Diagnosis present

## 2019-01-31 DIAGNOSIS — Z6841 Body Mass Index (BMI) 40.0 and over, adult: Secondary | ICD-10-CM | POA: Diagnosis not present

## 2019-01-31 DIAGNOSIS — Z7901 Long term (current) use of anticoagulants: Secondary | ICD-10-CM

## 2019-01-31 DIAGNOSIS — G4730 Sleep apnea, unspecified: Secondary | ICD-10-CM | POA: Diagnosis present

## 2019-01-31 DIAGNOSIS — M19041 Primary osteoarthritis, right hand: Secondary | ICD-10-CM | POA: Diagnosis not present

## 2019-01-31 DIAGNOSIS — Z809 Family history of malignant neoplasm, unspecified: Secondary | ICD-10-CM

## 2019-01-31 DIAGNOSIS — E871 Hypo-osmolality and hyponatremia: Secondary | ICD-10-CM | POA: Diagnosis not present

## 2019-01-31 DIAGNOSIS — H409 Unspecified glaucoma: Secondary | ICD-10-CM | POA: Diagnosis not present

## 2019-01-31 DIAGNOSIS — E119 Type 2 diabetes mellitus without complications: Secondary | ICD-10-CM | POA: Diagnosis not present

## 2019-01-31 DIAGNOSIS — Z96651 Presence of right artificial knee joint: Secondary | ICD-10-CM | POA: Diagnosis not present

## 2019-01-31 DIAGNOSIS — M65861 Other synovitis and tenosynovitis, right lower leg: Secondary | ICD-10-CM | POA: Diagnosis not present

## 2019-01-31 DIAGNOSIS — Z888 Allergy status to other drugs, medicaments and biological substances status: Secondary | ICD-10-CM | POA: Diagnosis not present

## 2019-01-31 DIAGNOSIS — Z794 Long term (current) use of insulin: Secondary | ICD-10-CM | POA: Diagnosis not present

## 2019-01-31 DIAGNOSIS — R279 Unspecified lack of coordination: Secondary | ICD-10-CM | POA: Diagnosis not present

## 2019-01-31 DIAGNOSIS — K219 Gastro-esophageal reflux disease without esophagitis: Secondary | ICD-10-CM | POA: Diagnosis not present

## 2019-01-31 DIAGNOSIS — M19042 Primary osteoarthritis, left hand: Secondary | ICD-10-CM | POA: Diagnosis present

## 2019-01-31 DIAGNOSIS — I10 Essential (primary) hypertension: Secondary | ICD-10-CM | POA: Diagnosis not present

## 2019-01-31 DIAGNOSIS — Z96652 Presence of left artificial knee joint: Secondary | ICD-10-CM | POA: Diagnosis not present

## 2019-01-31 DIAGNOSIS — Z79899 Other long term (current) drug therapy: Secondary | ICD-10-CM

## 2019-01-31 DIAGNOSIS — T84032A Mechanical loosening of internal right knee prosthetic joint, initial encounter: Principal | ICD-10-CM

## 2019-01-31 DIAGNOSIS — I2510 Atherosclerotic heart disease of native coronary artery without angina pectoris: Secondary | ICD-10-CM | POA: Diagnosis present

## 2019-01-31 DIAGNOSIS — E559 Vitamin D deficiency, unspecified: Secondary | ICD-10-CM | POA: Diagnosis present

## 2019-01-31 DIAGNOSIS — T84032D Mechanical loosening of internal right knee prosthetic joint, subsequent encounter: Secondary | ICD-10-CM

## 2019-01-31 DIAGNOSIS — D62 Acute posthemorrhagic anemia: Secondary | ICD-10-CM | POA: Diagnosis not present

## 2019-01-31 DIAGNOSIS — M1711 Unilateral primary osteoarthritis, right knee: Secondary | ICD-10-CM | POA: Diagnosis not present

## 2019-01-31 DIAGNOSIS — R5381 Other malaise: Secondary | ICD-10-CM | POA: Diagnosis not present

## 2019-01-31 HISTORY — PX: TOTAL KNEE REVISION: SHX996

## 2019-01-31 LAB — CBC
HCT: 34.9 % — ABNORMAL LOW (ref 36.0–46.0)
Hemoglobin: 10.6 g/dL — ABNORMAL LOW (ref 12.0–15.0)
MCH: 27.2 pg (ref 26.0–34.0)
MCHC: 30.4 g/dL (ref 30.0–36.0)
MCV: 89.7 fL (ref 80.0–100.0)
Platelets: 373 10*3/uL (ref 150–400)
RBC: 3.89 MIL/uL (ref 3.87–5.11)
RDW: 16.5 % — ABNORMAL HIGH (ref 11.5–15.5)
WBC: 12.3 10*3/uL — ABNORMAL HIGH (ref 4.0–10.5)
nRBC: 0 % (ref 0.0–0.2)

## 2019-01-31 LAB — TYPE AND SCREEN
ABO/RH(D): B POS
Antibody Screen: NEGATIVE

## 2019-01-31 LAB — GLUCOSE, CAPILLARY
Glucose-Capillary: 152 mg/dL — ABNORMAL HIGH (ref 70–99)
Glucose-Capillary: 320 mg/dL — ABNORMAL HIGH (ref 70–99)
Glucose-Capillary: 342 mg/dL — ABNORMAL HIGH (ref 70–99)
Glucose-Capillary: 256 mg/dL — ABNORMAL HIGH (ref 70–99)
Glucose-Capillary: 288 mg/dL — ABNORMAL HIGH (ref 70–99)
Glucose-Capillary: 368 mg/dL — ABNORMAL HIGH (ref 70–99)

## 2019-01-31 LAB — PROTIME-INR
INR: 1.1 (ref 0.8–1.2)
Prothrombin Time: 14 seconds (ref 11.4–15.2)

## 2019-01-31 LAB — CREATININE, SERUM
Creatinine, Ser: 0.93 mg/dL (ref 0.44–1.00)
GFR calc Af Amer: >60 mL/min (ref >60)
GFR calc non Af Amer: >60 mL/min (ref >60)

## 2019-01-31 LAB — HEMOGLOBIN A1C
Hgb A1c MFr Bld: 8.8 % — ABNORMAL HIGH (ref 4.8–5.6)
Mean Plasma Glucose: 205.86 mg/dL

## 2019-01-31 SURGERY — TOTAL KNEE REVISION
Anesthesia: General | Site: Knee | Laterality: Right

## 2019-01-31 MED ORDER — FLUTICASONE PROPIONATE 50 MCG/ACT NA SUSP
1.0000 | Freq: Every day | NASAL | Status: DC | PRN
  Filled 2019-01-31: qty 16

## 2019-01-31 MED ORDER — METHOCARBAMOL 500 MG IVPB - SIMPLE MED
500.0000 mg | Freq: Four times a day (QID) | INTRAVENOUS | Status: DC | PRN
  Administered 2019-01-31: 500 mg via INTRAVENOUS
  Filled 2019-01-31: qty 50

## 2019-01-31 MED ORDER — VANCOMYCIN HCL 1000 MG IV SOLR
INTRAVENOUS | Status: AC
  Filled 2019-01-31: qty 2000

## 2019-01-31 MED ORDER — INSULIN ASPART 100 UNIT/ML ~~LOC~~ SOLN
SUBCUTANEOUS | Status: AC
  Filled 2019-01-31: qty 1

## 2019-01-31 MED ORDER — GATIFLOXACIN 0.5 % OP SOLN: 1.0000 [drp] | Freq: Four times a day (QID) | OPHTHALMIC | Status: DC

## 2019-01-31 MED ORDER — PROPOFOL 10 MG/ML IV BOLUS
INTRAVENOUS | Status: DC | PRN
  Administered 2019-01-31: 170 mg via INTRAVENOUS

## 2019-01-31 MED ORDER — LACTATED RINGERS IV SOLN
INTRAVENOUS | Status: DC
  Administered 2019-01-31 (×2): via INTRAVENOUS

## 2019-01-31 MED ORDER — ONDANSETRON HCL 4 MG/2ML IJ SOLN
INTRAMUSCULAR | Status: AC
  Filled 2019-01-31: qty 2

## 2019-01-31 MED ORDER — DEXAMETHASONE SODIUM PHOSPHATE 10 MG/ML IJ SOLN
INTRAMUSCULAR | Status: DC | PRN
  Administered 2019-01-31: 4 mg via INTRAVENOUS

## 2019-01-31 MED ORDER — PHENOL 1.4 % MT LIQD: 1.0000 | OROMUCOSAL | Status: DC | PRN

## 2019-01-31 MED ORDER — BUPIVACAINE-EPINEPHRINE 0.25% -1:200000 IJ SOLN
INTRAMUSCULAR | Status: DC | PRN
  Administered 2019-01-31: 20 mL

## 2019-01-31 MED ORDER — CHLORHEXIDINE GLUCONATE 4 % EX LIQD: 60.0000 mL | Freq: Once | CUTANEOUS | Status: DC

## 2019-01-31 MED ORDER — SUCCINYLCHOLINE CHLORIDE 200 MG/10ML IV SOSY
PREFILLED_SYRINGE | INTRAVENOUS | Status: AC
  Filled 2019-01-31: qty 10

## 2019-01-31 MED ORDER — VITAMIN C 500 MG PO TABS
500.0000 mg | ORAL_TABLET | Freq: Every day | ORAL | Status: DC
  Administered 2019-01-31 – 2019-02-02 (×3): 500 mg via ORAL
  Filled 2019-01-31 (×3): qty 1

## 2019-01-31 MED ORDER — BISACODYL 10 MG RE SUPP: 10.0000 mg | Freq: Every day | RECTAL | Status: DC | PRN

## 2019-01-31 MED ORDER — SODIUM CHLORIDE (PF) 0.9 % IJ SOLN
INTRAMUSCULAR | Status: AC
  Filled 2019-01-31: qty 10

## 2019-01-31 MED ORDER — ALUM & MAG HYDROXIDE-SIMETH 200-200-20 MG/5ML PO SUSP
30.0000 mL | ORAL | Status: DC | PRN
  Administered 2019-02-01 – 2019-02-02 (×2): 30 mL via ORAL
  Filled 2019-01-31 (×2): qty 30

## 2019-01-31 MED ORDER — ROCURONIUM BROMIDE 50 MG/5ML IV SOSY
PREFILLED_SYRINGE | INTRAVENOUS | Status: DC | PRN
  Administered 2019-01-31: 50 mg via INTRAVENOUS

## 2019-01-31 MED ORDER — INSULIN ASPART 100 UNIT/ML ~~LOC~~ SOLN
10.0000 [IU] | Freq: Once | SUBCUTANEOUS | Status: AC
  Administered 2019-01-31: 10 [IU] via INTRAVENOUS

## 2019-01-31 MED ORDER — DEXAMETHASONE SODIUM PHOSPHATE 10 MG/ML IJ SOLN
INTRAMUSCULAR | Status: AC
  Filled 2019-01-31: qty 1

## 2019-01-31 MED ORDER — VITAMIN D 25 MCG (1000 UNIT) PO TABS
1000.0000 [IU] | ORAL_TABLET | Freq: Every day | ORAL | Status: DC
  Administered 2019-01-31 – 2019-02-01 (×2): 1000 [IU] via ORAL
  Filled 2019-01-31 (×2): qty 1

## 2019-01-31 MED ORDER — INSULIN ASPART 100 UNIT/ML ~~LOC~~ SOLN
0.0000 [IU] | Freq: Every day | SUBCUTANEOUS | Status: DC
  Administered 2019-01-31: 5 [IU] via SUBCUTANEOUS

## 2019-01-31 MED ORDER — BUPIVACAINE HCL (PF) 0.5 % IJ SOLN
INTRAMUSCULAR | Status: AC
  Filled 2019-01-31: qty 30

## 2019-01-31 MED ORDER — SODIUM CHLORIDE 0.9 % IV SOLN: INTRAVENOUS | Status: DC

## 2019-01-31 MED ORDER — ATORVASTATIN CALCIUM 10 MG PO TABS
10.0000 mg | ORAL_TABLET | Freq: Every day | ORAL | Status: DC
  Administered 2019-01-31 – 2019-02-01 (×2): 10 mg via ORAL
  Filled 2019-01-31 (×2): qty 1

## 2019-01-31 MED ORDER — PROPOFOL 500 MG/50ML IV EMUL
INTRAVENOUS | Status: AC
  Filled 2019-01-31: qty 50

## 2019-01-31 MED ORDER — MONTELUKAST SODIUM 10 MG PO TABS
10.0000 mg | ORAL_TABLET | Freq: Every day | ORAL | Status: DC
  Administered 2019-01-31 – 2019-02-01 (×2): 10 mg via ORAL
  Filled 2019-01-31 (×2): qty 1

## 2019-01-31 MED ORDER — BUPIVACAINE-EPINEPHRINE 0.25% -1:200000 IJ SOLN
INTRAMUSCULAR | Status: AC
  Filled 2019-01-31: qty 1

## 2019-01-31 MED ORDER — ONDANSETRON HCL 4 MG/2ML IJ SOLN
INTRAMUSCULAR | Status: DC | PRN
  Administered 2019-01-31 (×2): 4 mg via INTRAVENOUS

## 2019-01-31 MED ORDER — FENTANYL CITRATE (PF) 100 MCG/2ML IJ SOLN
INTRAMUSCULAR | Status: DC | PRN
  Administered 2019-01-31 (×6): 50 ug via INTRAVENOUS

## 2019-01-31 MED ORDER — OXYCODONE HCL 5 MG PO TABS
ORAL_TABLET | ORAL | Status: AC
  Filled 2019-01-31: qty 1

## 2019-01-31 MED ORDER — FOLIC ACID 1 MG PO TABS
1.0000 mg | ORAL_TABLET | Freq: Every evening | ORAL | Status: DC
  Administered 2019-01-31 – 2019-02-01 (×2): 1 mg via ORAL
  Filled 2019-01-31 (×2): qty 1

## 2019-01-31 MED ORDER — LIDOCAINE 2% (20 MG/ML) 5 ML SYRINGE
INTRAMUSCULAR | Status: DC | PRN
  Administered 2019-01-31: 60 mg via INTRAVENOUS

## 2019-01-31 MED ORDER — HEPARIN SODIUM (PORCINE) 5000 UNIT/ML IJ SOLN
5000.0000 [IU] | INTRAMUSCULAR | Status: DC
  Filled 2019-01-31: qty 1

## 2019-01-31 MED ORDER — DOCUSATE SODIUM 100 MG PO CAPS
100.0000 mg | ORAL_CAPSULE | Freq: Two times a day (BID) | ORAL | Status: DC
  Administered 2019-01-31 – 2019-02-02 (×4): 100 mg via ORAL
  Filled 2019-01-31 (×4): qty 1

## 2019-01-31 MED ORDER — BENAZEPRIL HCL 20 MG PO TABS
40.0000 mg | ORAL_TABLET | Freq: Every day | ORAL | Status: DC
  Administered 2019-01-31 – 2019-02-02 (×3): 40 mg via ORAL
  Filled 2019-01-31: qty 2
  Filled 2019-01-31: qty 4
  Filled 2019-01-31 (×2): qty 2

## 2019-01-31 MED ORDER — ACETAMINOPHEN 325 MG PO TABS: 650.0000 mg | ORAL_TABLET | Freq: Four times a day (QID) | ORAL | Status: DC | PRN

## 2019-01-31 MED ORDER — METHOCARBAMOL 500 MG IVPB - SIMPLE MED
INTRAVENOUS | Status: AC
  Filled 2019-01-31: qty 50

## 2019-01-31 MED ORDER — OXYCODONE HCL 5 MG PO TABS
ORAL_TABLET | ORAL | Status: AC
  Administered 2019-01-31: 15:00:00
  Filled 2019-01-31: qty 1

## 2019-01-31 MED ORDER — ONDANSETRON HCL 4 MG/2ML IJ SOLN: 4.0000 mg | Freq: Four times a day (QID) | INTRAMUSCULAR | Status: DC | PRN

## 2019-01-31 MED ORDER — WARFARIN SODIUM 5 MG PO TABS
7.5000 mg | ORAL_TABLET | Freq: Once | ORAL | Status: AC
  Administered 2019-01-31: 7.5 mg via ORAL
  Filled 2019-01-31 (×2): qty 1

## 2019-01-31 MED ORDER — MAGNESIUM HYDROXIDE 400 MG/5ML PO SUSP: 30.0000 mL | Freq: Every day | ORAL | Status: DC | PRN

## 2019-01-31 MED ORDER — VERAPAMIL HCL ER 180 MG PO TBCR
180.0000 mg | EXTENDED_RELEASE_TABLET | Freq: Two times a day (BID) | ORAL | Status: DC
  Administered 2019-01-31 – 2019-02-02 (×4): 180 mg via ORAL
  Filled 2019-01-31 (×5): qty 1

## 2019-01-31 MED ORDER — LABETALOL HCL 5 MG/ML IV SOLN
INTRAVENOUS | Status: AC
  Filled 2019-01-31: qty 4

## 2019-01-31 MED ORDER — FENTANYL CITRATE (PF) 100 MCG/2ML IJ SOLN
INTRAMUSCULAR | Status: AC
  Filled 2019-01-31: qty 2

## 2019-01-31 MED ORDER — MAGNESIUM CITRATE PO SOLN: 1.0000 | Freq: Once | ORAL | Status: DC | PRN

## 2019-01-31 MED ORDER — ONDANSETRON HCL 4 MG PO TABS
4.0000 mg | ORAL_TABLET | Freq: Four times a day (QID) | ORAL | Status: DC | PRN
  Administered 2019-02-01: 4 mg via ORAL
  Filled 2019-01-31: qty 1

## 2019-01-31 MED ORDER — MIDAZOLAM HCL 2 MG/2ML IJ SOLN
INTRAMUSCULAR | Status: AC
  Filled 2019-01-31: qty 2

## 2019-01-31 MED ORDER — VANCOMYCIN HCL 1000 MG IV SOLR
INTRAVENOUS | Status: DC | PRN
  Administered 2019-01-31: 2 g

## 2019-01-31 MED ORDER — INSULIN ASPART 100 UNIT/ML ~~LOC~~ SOLN
0.0000 [IU] | Freq: Three times a day (TID) | SUBCUTANEOUS | Status: DC
  Administered 2019-01-31: 17:00:00 8 [IU] via SUBCUTANEOUS
  Administered 2019-02-01: 3 [IU] via SUBCUTANEOUS
  Administered 2019-02-01: 15 [IU] via SUBCUTANEOUS
  Administered 2019-02-01: 11 [IU] via SUBCUTANEOUS
  Administered 2019-02-02 (×2): 5 [IU] via SUBCUTANEOUS

## 2019-01-31 MED ORDER — METOCLOPRAMIDE HCL 5 MG PO TABS: 5.0000 mg | ORAL_TABLET | Freq: Three times a day (TID) | ORAL | Status: DC | PRN

## 2019-01-31 MED ORDER — OXYCODONE HCL 5 MG PO TABS
5.0000 mg | ORAL_TABLET | ORAL | Status: DC | PRN
  Administered 2019-01-31 (×3): 5 mg via ORAL
  Administered 2019-02-01 (×2): 10 mg via ORAL
  Administered 2019-02-01 – 2019-02-02 (×2): 5 mg via ORAL
  Administered 2019-02-02: 10 mg via ORAL
  Filled 2019-01-31 (×3): qty 2
  Filled 2019-01-31: qty 1
  Filled 2019-01-31: qty 2
  Filled 2019-01-31 (×2): qty 1

## 2019-01-31 MED ORDER — CEFAZOLIN SODIUM-DEXTROSE 2-4 GM/100ML-% IV SOLN
2.0000 g | Freq: Four times a day (QID) | INTRAVENOUS | Status: AC
  Administered 2019-01-31 (×2): 2 g via INTRAVENOUS
  Filled 2019-01-31 (×2): qty 100

## 2019-01-31 MED ORDER — FUROSEMIDE 40 MG PO TABS
40.0000 mg | ORAL_TABLET | Freq: Every day | ORAL | Status: DC
  Administered 2019-01-31 – 2019-02-02 (×3): 40 mg via ORAL
  Filled 2019-01-31 (×3): qty 1

## 2019-01-31 MED ORDER — CEFAZOLIN SODIUM-DEXTROSE 2-4 GM/100ML-% IV SOLN
INTRAVENOUS | Status: AC
  Filled 2019-01-31: qty 100

## 2019-01-31 MED ORDER — ROCURONIUM BROMIDE 10 MG/ML (PF) SYRINGE
PREFILLED_SYRINGE | INTRAVENOUS | Status: AC
  Filled 2019-01-31: qty 10

## 2019-01-31 MED ORDER — SODIUM CHLORIDE 0.9 % IV SOLN
75.0000 mL/h | INTRAVENOUS | Status: DC
  Administered 2019-01-31: 75 mL/h via INTRAVENOUS

## 2019-01-31 MED ORDER — METOCLOPRAMIDE HCL 5 MG/ML IJ SOLN: 5.0000 mg | Freq: Three times a day (TID) | INTRAMUSCULAR | Status: DC | PRN

## 2019-01-31 MED ORDER — LIDOCAINE 2% (20 MG/ML) 5 ML SYRINGE
INTRAMUSCULAR | Status: AC
  Filled 2019-01-31: qty 5

## 2019-01-31 MED ORDER — ENOXAPARIN SODIUM 30 MG/0.3ML ~~LOC~~ SOLN
30.0000 mg | SUBCUTANEOUS | Status: DC
  Administered 2019-02-01 – 2019-02-02 (×2): 30 mg via SUBCUTANEOUS
  Filled 2019-01-31 (×2): qty 0.3

## 2019-01-31 MED ORDER — ONDANSETRON HCL 4 MG/2ML IJ SOLN: 4.0000 mg | Freq: Once | INTRAMUSCULAR | Status: DC | PRN

## 2019-01-31 MED ORDER — PANTOPRAZOLE SODIUM 40 MG PO TBEC
80.0000 mg | DELAYED_RELEASE_TABLET | Freq: Every day | ORAL | Status: DC
  Administered 2019-02-01 – 2019-02-02 (×2): 80 mg via ORAL
  Filled 2019-01-31 (×2): qty 2

## 2019-01-31 MED ORDER — HYDROMORPHONE HCL 1 MG/ML IJ SOLN
0.5000 mg | INTRAMUSCULAR | Status: DC | PRN
  Administered 2019-02-01: 0.5 mg via INTRAVENOUS
  Filled 2019-01-31: qty 0.5

## 2019-01-31 MED ORDER — CEFAZOLIN SODIUM-DEXTROSE 2-3 GM-%(50ML) IV SOLR
INTRAVENOUS | Status: DC | PRN
  Administered 2019-01-31: 2 g via INTRAVENOUS

## 2019-01-31 MED ORDER — DIPHENHYDRAMINE HCL 12.5 MG/5ML PO ELIX: 12.5000 mg | ORAL_SOLUTION | ORAL | Status: DC | PRN

## 2019-01-31 MED ORDER — PREGABALIN 100 MG PO CAPS
100.0000 mg | ORAL_CAPSULE | Freq: Two times a day (BID) | ORAL | Status: DC
  Administered 2019-01-31 – 2019-02-02 (×4): 100 mg via ORAL
  Filled 2019-01-31 (×4): qty 1

## 2019-01-31 MED ORDER — WARFARIN - PHARMACIST DOSING INPATIENT
Freq: Every day | Status: DC
  Administered 2019-01-31: 17:00:00

## 2019-01-31 MED ORDER — SODIUM CHLORIDE 0.9 % IR SOLN
Status: DC | PRN
  Administered 2019-01-31: 3000 mL

## 2019-01-31 MED ORDER — HEPARIN SODIUM (PORCINE) 1000 UNIT/ML IJ SOLN
INTRAMUSCULAR | Status: AC
  Filled 2019-01-31: qty 5

## 2019-01-31 MED ORDER — ACETAMINOPHEN 325 MG PO TABS
650.0000 mg | ORAL_TABLET | Freq: Four times a day (QID) | ORAL | Status: DC | PRN
  Administered 2019-02-01 – 2019-02-02 (×3): 650 mg via ORAL
  Filled 2019-01-31 (×3): qty 2

## 2019-01-31 MED ORDER — TRANEXAMIC ACID-NACL 1000-0.7 MG/100ML-% IV SOLN
INTRAVENOUS | Status: AC
  Filled 2019-01-31: qty 100

## 2019-01-31 MED ORDER — MENTHOL 3 MG MT LOZG: 1.0000 | LOZENGE | OROMUCOSAL | Status: DC | PRN

## 2019-01-31 MED ORDER — METHOCARBAMOL 500 MG PO TABS
500.0000 mg | ORAL_TABLET | Freq: Four times a day (QID) | ORAL | Status: DC | PRN
  Administered 2019-01-31 – 2019-02-02 (×4): 500 mg via ORAL
  Filled 2019-01-31 (×4): qty 1

## 2019-01-31 MED ORDER — FENTANYL CITRATE (PF) 100 MCG/2ML IJ SOLN: 25.0000 ug | INTRAMUSCULAR | Status: DC | PRN

## 2019-01-31 SURGICAL SUPPLY — 58 items
AUG TIB SZ2.5 10 REV STP WDG (Knees) ×2 IMPLANT
BAG SPEC THK2 15X12 ZIP CLS (MISCELLANEOUS) ×1
BAG ZIPLOCK 12X15 (MISCELLANEOUS) ×3 IMPLANT
BIT DRILL CANN 2.7X625 NONSTRL (BIT) ×2 IMPLANT
BLADE SURG SZ10 CARB STEEL (BLADE) ×6 IMPLANT
BNDG ELASTIC 4X5.8 VLCR STR LF (GAUZE/BANDAGES/DRESSINGS) ×3 IMPLANT
BNDG ELASTIC 6X5.8 VLCR STR LF (GAUZE/BANDAGES/DRESSINGS) ×3 IMPLANT
BNDG GAUZE ELAST 4 BULKY (GAUZE/BANDAGES/DRESSINGS) ×6 IMPLANT
BOWL SMART MIX CTS (DISPOSABLE) ×3 IMPLANT
CEMENT BONE DEPUY (Cement) ×2 IMPLANT
CEMENT HV SMART SET (Cement) ×4 IMPLANT
COVER SURGICAL LIGHT HANDLE (MISCELLANEOUS) ×3 IMPLANT
COVER WAND RF STERILE (DRAPES) ×2 IMPLANT
CUFF TOURN SGL QUICK 34 (TOURNIQUET CUFF) ×3
CUFF TRNQT CYL 34X4.125X (TOURNIQUET CUFF) ×1 IMPLANT
DECANTER SPIKE VIAL GLASS SM (MISCELLANEOUS) ×3 IMPLANT
DRAPE C-ARM 42X120 X-RAY (DRAPES) ×4 IMPLANT
DRAPE ORTHO SPLIT 77X108 STRL (DRAPES) ×3
DRAPE SURG ORHT 6 SPLT 77X108 (DRAPES) ×1 IMPLANT
DRSG EMULSION OIL 3X16 NADH (GAUZE/BANDAGES/DRESSINGS) ×3 IMPLANT
DRSG MEPILEX BORDER 4X12 (GAUZE/BANDAGES/DRESSINGS) ×2 IMPLANT
DRSG PAD ABDOMINAL 8X10 ST (GAUZE/BANDAGES/DRESSINGS) ×3 IMPLANT
ELECT REM PT RETURN 15FT ADLT (MISCELLANEOUS) ×3 IMPLANT
EVACUATOR 1/8 PVC DRAIN (DRAIN) ×3 IMPLANT
GAUZE SPONGE 4X4 12PLY STRL (GAUZE/BANDAGES/DRESSINGS) ×3 IMPLANT
GLOVE ECLIPSE 8.0 STRL XLNG CF (GLOVE) ×8 IMPLANT
GUIDEWIRE THREADED 150MM (WIRE) ×8 IMPLANT
HANDPIECE INTERPULSE COAX TIP (DISPOSABLE) ×3
HOLDER FOLEY CATH W/STRAP (MISCELLANEOUS) IMPLANT
INSERT TIB LCS RP STD 12.5 (Knees) ×2 IMPLANT
KIT BASIN OR (CUSTOM PROCEDURE TRAY) ×3 IMPLANT
KIT TURNOVER KIT A (KITS) IMPLANT
MANIFOLD NEPTUNE II (INSTRUMENTS) ×3 IMPLANT
NDL SAFETY ECLIPSE 18X1.5 (NEEDLE) IMPLANT
NEEDLE HYPO 18GX1.5 SHARP (NEEDLE)
NS IRRIG 1000ML POUR BTL (IV SOLUTION) ×3 IMPLANT
PACK TOTAL KNEE CUSTOM (KITS) IMPLANT
PAD CAST 4YDX4 CTTN HI CHSV (CAST SUPPLIES) ×2 IMPLANT
PADDING CAST COTTON 4X4 STRL (CAST SUPPLIES) ×6
PEG LCS COM 3 MB STD (Knees) ×2 IMPLANT
PIN STEINMAN FIXATION KNEE (PIN) ×2 IMPLANT
PROTECTOR NERVE ULNAR (MISCELLANEOUS) ×3 IMPLANT
SCREW CANN P.T. 14MM (Screw) ×2 IMPLANT
SET HNDPC FAN SPRY TIP SCT (DISPOSABLE) ×1 IMPLANT
STAPLER VISISTAT 35W (STAPLE) IMPLANT
STEM UNIVERSAL 115X12MM (Stem) ×2 IMPLANT
SUT BONE WAX W31G (SUTURE) ×3 IMPLANT
SUT ETHIBOND NAB CT1 #1 30IN (SUTURE) ×5 IMPLANT
SUT MNCRL AB 3-0 PS2 18 (SUTURE) ×2 IMPLANT
SUT VIC AB 2-0 CT1 27 (SUTURE) ×9
SUT VIC AB 2-0 CT1 TAPERPNT 27 (SUTURE) ×3 IMPLANT
SYR 3ML LL SCALE MARK (SYRINGE) IMPLANT
TRAY FOLEY MTR SLVR 16FR STAT (SET/KITS/TRAYS/PACK) ×3 IMPLANT
TRAY REVISION SZ 2.5 (Knees) ×2 IMPLANT
UNDERPAD 30X36 HEAVY ABSORB (UNDERPADS AND DIAPERS) ×3 IMPLANT
WATER STERILE IRR 1000ML POUR (IV SOLUTION) ×6 IMPLANT
WEDGE STEP 2.5 10MM (Knees) ×4 IMPLANT
gide pin ×8 IMPLANT

## 2019-01-31 NOTE — Anesthesia Procedure Notes (Signed)
Anesthesia Regional Block: Adductor canal block   Pre-Anesthetic Checklist: ,, timeout performed, Correct Patient, Correct Site, Correct Laterality, Correct Procedure, Correct Position, site marked, Risks and benefits discussed,  Surgical consent,  Pre-op evaluation,  At surgeon's request and post-op pain management  Laterality: Right  Prep: chloraprep       Needles:  Injection technique: Single-shot  Needle Type: Echogenic Needle     Needle Length: 9cm  Needle Gauge: 21     Additional Needles:   Procedures:,,,, ultrasound used (permanent image in chart),,,,  Narrative:  Start time: 01/31/2019 6:50 AM End time: 01/31/2019 6:55 AM Injection made incrementally with aspirations every 5 mL.  Performed by: Personally  Anesthesiologist: Suzette Battiest, MD

## 2019-01-31 NOTE — Anesthesia Procedure Notes (Signed)
Procedure Name: Intubation Date/Time: 01/31/2019 7:39 AM Performed by: Genelle Bal, CRNA Pre-anesthesia Checklist: Patient identified, Emergency Drugs available, Suction available and Patient being monitored Patient Re-evaluated:Patient Re-evaluated prior to induction Oxygen Delivery Method: Circle system utilized Preoxygenation: Pre-oxygenation with 100% oxygen Induction Type: IV induction Ventilation: Mask ventilation without difficulty and Oral airway inserted - appropriate to patient size Laryngoscope Size: Sabra Heck and 2 Grade View: Grade I Tube type: Oral Tube size: 7.0 mm Number of attempts: 1 Airway Equipment and Method: Stylet and Oral airway Placement Confirmation: ETT inserted through vocal cords under direct vision,  positive ETCO2 and breath sounds checked- equal and bilateral Secured at: 22 cm Tube secured with: Tape Dental Injury: Teeth and Oropharynx as per pre-operative assessment

## 2019-01-31 NOTE — Op Note (Signed)
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  PROCEDURE:  Revision of tibial component, right total knee replacement and patella polyethylene component.  SURGEON:  Joni Fears, MD  ASSISTANT:  Biagio Borg, PA-C, was present throughout the operative procedure to ensure its timely completion.  ANESTHESIA:  General with supplemental scalene adductor canal block.  COMPLICATIONS:  None.  COMPONENTS:  DePuy 2.5 MBT revision rotating tibial tray with medial and lateral 10 mm step wedges, a 115 mm x 12 mm fluted stem and a 12.5 mm rotating polyethylene component and a poly replacement for the patella.  Tibial component was cemented.  DESCRIPTION OF PROCEDURE:  The patient was met in the holding area and identified the right knee as appropriate site and marked it accordingly.  Anesthesia performed an adductor canal block.  The patient was then transported to room #5 and placed under general anesthesia without difficulty.  Right lower extremity, which had been appropriately marked before surgery was then placed in a thigh tourniquet.  The leg was prepped with chlorhexidine scrub, and ChloraPrep x2 from the tourniquet to the tips of the toes.  Sterile draping was performed.  A timeout was called.  Right lower extremity was then elevated and Esmarch exsanguinated with a proximal tourniquet at 325 mmHg.  The prior total knee incision was elliptically excised and via sharp dissection carried down to subcutaneous tissue.  The prior medial parapatellar incision was identified by the nonabsorbable polyethylene sutures.  These were incised and the sutures   were removed.  The joint was entered.  There was a clear yellow joint effusion.  This was sent for anaerobic and aerobic culture.  I also sent deep synovium for white cells per high powered field without any evidence of acute inflammatory changes.  A synovectomy was performed.  There was no obvious infection.  There was some metalosis in the synovium, which was also removed.  We carefully evaluated the femoral component felt that it was perfectly stable and therefore did not revise it.  The tibial tray was obviously loose.  I amputated the stem of the polyethylene component with a 1/4 inch curved osteotome and then easily removed the metallic component as it had loosened from its methyl methacrylate beads.  Retractor was then carefully placed about the tibia.  The cement mantle was removed using a small straight osteotome.  The component had been in significant valgus.  Care was taken to obtain the appropriate tibial canal.  This was reamed and I checked  with image intensification to be sure we were centered.  I reamed to 12 mm and using the reamer in place as a guide, the cutting guide transversely from the tibia was then inserted and this cleanup cut was made with a 2-degree angle of declination posteriorly.  There were a number of cysts, particularly laterally that were removed with a curette and a small rongeur.  We measured a #2.5 tibial tray.  As we were pinning this in place, there was a small fracture of the medial tibial plateau and the bone weakened by cyst.  This was placed in its anatomic position with K-wires and a single 14 mm x 2.7 mm screw was  inserted along the medial tibial plateau.  We then  continued with reaming to 12 mm of the tibia.  We again checked with the image intensification to be sure we had the appropriate length and we were centered within the canal.  We then constructed a 2.5 tibial tray with 10 mm augments both medially and laterally.  The trial was then  inserted, the center hole made followed by the wedge cut.  With this in place we had excellent apposition surrounding the component.  We trialed  and a 10 and a 12.5 mm polyethylene component.  We felt that the 12.5 gave was perfect stability and still allowed full extension and flexion over 100 degrees.  At that point, the tourniquet had been up 2 hours.  This was released.  We obtained excellent hemostasis.  On a separate table, the tibial component was assembled using a 2.5 mm tibial tray with two 10 mm augments and a 115 mm long x 12 mm wide stem.  The wound was copiously irrigated with saline solution.  We then after 15 minutes re-esmarched the extremity and elevated the tourniquet at 325 mmHg.  Wound was again irrigated with saline solution.  We then inserted the tibial tray with methacrylate proximally and not along the stem using two bags of cement  with a gram of vancomycin in each.  The assembled tibial component was then impacted in place and appeared to be in excellent position without any instability of the surrounding bone.  We filled the bone cyst with cement.  This was maintained in place.  Waiting for the methacrylate to  mature, we removed the polyethylene patellar component and reinserted a new component.  There were some scratches along the component from years of wear.  After 16 minutes, the methacrylate had matured.  We checked stability.  We again trialed a 10 and 12 mm polyethylene components and felt that the 12.5 gave [place] the best stability and still allowing full range of motion.  The final rotating 12.5 mm polyethylene component was then impacted.  The joint was placed through the full range of motion with excellent stability.  Tourniquet was then deflated at about 25 minutes.  We applied the tranexamic acid topically under compression.  I thought we had excellent hemostasis.  The wound was irrigated with saline solution.  The deep capsule was then closed with a  running #1 Ethibond.  Subcutaneous with several layers of Vicryl and 3-0 Monocryl in the subcutaneous.  Skin closed with skin clips.  Sterile bulky dressing was applied.  The patient tolerated the procedure without complications.  TN/NUANCE  D:01/31/2019 T:01/31/2019 JOB:[id number]

## 2019-01-31 NOTE — Progress Notes (Signed)
[place] for Warfarin Indication: DVT  Allergies  Allergen Reactions  . Iodine Itching    Had it injected in vein for eye exam (so eye doctor could take pictures) Patient states she had itching of skin that evening and burning of the tops of her feet    Patient Measurements:   TBW 108.6 kg  Vital Signs: Temp: 98.3 F (36.8 C) (11/17 1138) Temp Source: Oral (11/17 0624) BP: 136/64 (11/17 0624) Pulse Rate: 70 (11/17 0624)  Labs: Recent Labs    01/31/19 0545  LABPROT 14.0  INR 1.1    Estimated Creatinine Clearance: 63.6 mL/min (by C-G formula based on SCr of 0.92 mg/dL).   Medical History: Past Medical History:  Diagnosis Date  . Clotting disorder ([place])   . Cough 02/08/2017  . Diabetic retinopathy ([place])   . 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 Full dentures   . GERD (gastroesophageal reflux disease)   . Heart murmur   . History of blood clots 1996   groin  . History of glaucoma    had laser correction  . Hypertension    states under control with meds., has been on med. since 1996  . Insulin dependent diabetes mellitus   . Mitral valve prolapse   . Osteoarthritis    bilateral knee  . Seasonal allergies   . Sleep apnea    no CPAP use in several months, per pt.  . Trigger finger, left middle finger 01/2017    Medications:  Scheduled:  . chlorhexidine  60 mL Topical Once  . chlorhexidine  60 mL Topical Once  . heparin injection (subcutaneous)  5,000 Units Subcutaneous To OR  . tranexamic acid (CYKLOKAPRON) topical -INTRAOP  2,000 mg Topical To OR    Assessment: 60 yoF s/p R total knee revision. Hx of DVT on Warfarin 10mg  alternating with 7.5mg  daily, last dose 11/12. Admit INR 1.1 Lovenox 30mg  daily bridging per MD  Goal of Therapy:  INR 2-3 Monitor platelets by anticoagulation protocol: Yes   Plan:  Warfarin 7.5mg  today at 1800 Daily Protime/INR  Minda Ditto PharmD 01/31/2019,12:01  PM

## 2019-01-31 NOTE — Evaluation (Signed)
Physical Therapy Evaluation Patient Details Name: Kaitlyn Jackson MRN: [id number] DOB: 01-13-1946 Today's Date: 01/31/2019   History of Present Illness  Patient is 73 y.o. female s/p Rt TKR on 01/31/19 with PMH significant for DM, OA, HTN.  Clinical Impression  Kaitlyn Jackson is a 73 y.o. female POD 0 s/p Rt TKR. Patient reports modified independence with SPC for mobility at baseline. Patient is now limited by functional impairments (see PT problem list below) and requires min assist for transfers and gait with RW. Patient was able to ambulate ~30 feet with RW and min assist and maitained PWB for Rt LE throughout. Patient instructed in exercise to facilitate ROM and circulation. Patient will benefit from continued skilled PT interventions to address impairments and progress towards PLOF. Acute PT will follow to progress mobility and stair training in preparation for safe discharge home.    Follow Up Recommendations Follow surgeon's recommendation for DC plan and follow-up therapies    Equipment Recommendations  None recommended by PT    Recommendations for Other Services       Precautions / Restrictions Precautions Precautions: Fall Restrictions Weight Bearing Restrictions: Yes RLE Weight Bearing: Partial weight bearing RLE Partial Weight Bearing Percentage or Pounds: 50%      Mobility  Bed Mobility Overal bed mobility: Needs Assistance Bed Mobility: Supine to Sit     Supine to sit: Min assist;HOB elevated     General bed mobility comments: cues for sequencing and use of bed rail, assist for LE mobility and to raise trunk  Transfers Overall transfer level: Needs assistance Equipment used: Rolling walker (2 wheeled) Transfers: Sit to/from Stand Sit to Stand: Min assist         General transfer comment: cues for safe hand placement and technique and cues for 50% PWB on Rt LE upon rising  Ambulation/Gait Ambulation/Gait assistance: Min assist Gait Distance  (Feet): 30 Feet Assistive device: Rolling walker (2 wheeled) Gait Pattern/deviations: Step-through pattern;Decreased step length - right;Decreased stance time - right;Decreased stride length;Decreased weight shift to right;Wide base of support Gait velocity: decreased   General Gait Details: cues required for PWB status on Rt LE and pt able to demosntrate good UE use to reduce Rt LE weight bearing during stance phase. manual assist required to facilitate Rt knee extension in stance, no overt LOB noted.  Stairs            Wheelchair Mobility    Modified Rankin (Stroke Patients Only)       Balance Overall balance assessment: Needs assistance Sitting-balance support: Feet supported;No upper extremity supported Sitting balance-Leahy Scale: Fair     Standing balance support: During functional activity;Bilateral upper extremity supported Standing balance-Leahy Scale: Poor          Pertinent Vitals/Pain Pain Assessment: 0-10 Pain Score: 9  Pain Location: Rt knee Pain Descriptors / Indicators: Aching;Sore Pain Intervention(s): Monitored during session;Limited activity within patient's tolerance;Repositioned;Ice applied    Home Living Family/patient expects to be discharged to:: Private residence Living Arrangements: Spouse/significant other;Children Available Help at Discharge: Family;Available 24 hours/day Type of Home: Apartment Home Access: Stairs to enter Entrance Stairs-Rails: Chemical engineer of Steps: 8 (Rt hand rail the entire way) (Lt only for first four and can't reach both) Home Layout: One level Home Equipment: Shower seat;Grab bars - tub/shower;Toilet riser;Walker - 2 wheels;Cane - single point      Prior Function Level of Independence: Independent with assistive device(s)         Comments: pt was using  SPC     Hand Dominance   Dominant Hand: Right    Extremity/Trunk Assessment   Upper Extremity Assessment Upper Extremity  Assessment: Generalized weakness    Lower Extremity Assessment Lower Extremity Assessment: Generalized weakness       Communication   Communication: No difficulties  Cognition Arousal/Alertness: Awake/alert Behavior During Therapy: WFL for tasks assessed/performed Overall Cognitive Status: Within Functional Limits for tasks assessed         General Comments      Exercises Total Joint Exercises Ankle Circles/Pumps: AROM;15 reps;Seated;Both Quad Sets: AROM;10 reps;Seated;Right Heel Slides: AROM;10 reps;Seated;Right   Assessment/Plan    PT Assessment Patient needs continued PT services  PT Problem List Decreased activity tolerance;Decreased strength;Decreased range of motion;Decreased balance;Decreased mobility;Decreased knowledge of use of DME;Decreased knowledge of precautions       PT Treatment Interventions DME instruction;Functional mobility training;Balance training;Patient/family education;Modalities;Gait training;Therapeutic activities;Therapeutic exercise;Stair training    PT Goals (Current goals can be found in the Care Plan section)  Acute Rehab PT Goals Patient Stated Goal: to get walking better and without a cane PT Goal Formulation: With patient Time For Goal Achievement: 02/07/19 Potential to Achieve Goals: Good    Frequency 7X/week    AM-PAC PT "6 Clicks" Mobility  Outcome Measure Help needed turning from your back to your side while in a flat bed without using bedrails?: A Little Help needed moving from lying on your back to sitting on the side of a flat bed without using bedrails?: A Little Help needed moving to and from a bed to a chair (including a wheelchair)?: A Little Help needed standing up from a chair using your arms (e.g., wheelchair or bedside chair)?: A Little Help needed to walk in hospital room?: A Little Help needed climbing 3-5 steps with a railing? : A Lot 6 Click Score: 17    End of Session Equipment Utilized During Treatment:  Gait belt Activity Tolerance: Patient tolerated treatment well Patient left: in chair;with call bell/phone within reach Nurse Communication: Mobility status PT Visit Diagnosis: Muscle weakness (generalized) (M62.81);Difficulty in walking, not elsewhere classified (R26.2)    Time: [id number] PT Time Calculation (min) (ACUTE ONLY): 34 min   Charges:   PT Evaluation $PT Eval Low Complexity: 1 Low PT Treatments $Therapeutic Exercise: 8-22 mins        Kipp Brood, PT, DPT Physical Therapist with [facility]  01/31/2019 6:10 PM

## 2019-01-31 NOTE — H&P (Signed)
The recent History & Physical has been reviewed. I have personally examined the patient today. There is no interval change to the documented History & Physical. The patient would like to proceed with the procedure.  [name] 01/31/2019,  7:17 AM

## 2019-01-31 NOTE — Transfer of Care (Signed)
Immediate Anesthesia Transfer of Care Note  [name]  Procedure(s) Performed: Procedure(s): RIGHT TOTAL KNEE REVISION (Right)  Patient Location: PACU  Anesthesia Type:General  Level of Consciousness:  sedated, patient cooperative and responds to stimulation  Airway & Oxygen Therapy:Patient Spontanous Breathing and Patient connected to face mask oxgen  Post-op Assessment:  Report given to PACU RN and Post -op Vital signs reviewed and stable  Post vital signs:  Reviewed and stable  Last Vitals:  Vitals:   01/31/19 0624  BP: 136/64  Pulse: 70  Resp: 19  Temp: 36.6 C  SpO2: [id number]    Complications: No apparent anesthesia complications

## 2019-01-31 NOTE — Op Note (Signed)
PATIENT ID:      Kaitlyn Jackson  MRN:     [id number] DOB/AGE:    1945/12/11 / 73 y.o.       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   Weight as of 01/25/19: 108.6 kg.     POSTOPERATIVE DIAGNOSIS:  same                                                                    Estimated body mass index is 41.09 kg/m as calculated from the following:   Height as of 01/25/19: 5\' 4"  (1.626 m).   Weight as of 01/25/19: 108.6 kg.     PROCEDURE:  Procedure(s): RIGHT TOTAL KNEE REVISION OF TIBIAL AND PATELLA COMPONENTS     SURGEON:  Joni Fears, MD    ASSISTANT:   Biagio Borg, PA-C   (Present and scrubbed throughout the case, critical for assistance with exposure, retraction, instrumentation, and closure.)          ANESTHESIA: regional and general     DRAINS: none :      TOURNIQUET TIME:  Total Tourniquet Time Documented: Thigh (Right) - 121 minutes Thigh (Right) - 30 minutes Total: Thigh (Right) - 151 minutes     COMPLICATIONS:  None   CONDITION:  stable  PROCEDURE IN DETAIL: [place] 01/31/2019, 11:07 AM

## 2019-01-31 NOTE — Plan of Care (Signed)
POC initiated 

## 2019-02-01 ENCOUNTER — Encounter (HOSPITAL_COMMUNITY): Payer: Self-pay | Admitting: Orthopaedic Surgery

## 2019-02-01 LAB — PROTIME-INR
INR: 1.1 (ref 0.8–1.2)
Prothrombin Time: 14.1 seconds (ref 11.4–15.2)

## 2019-02-01 LAB — BASIC METABOLIC PANEL
Anion gap: 10 (ref 5–15)
BUN: 18 mg/dL (ref 8–23)
CO2: 25 mmol/L (ref 22–32)
Calcium: 8.2 mg/dL — ABNORMAL LOW (ref 8.9–10.3)
Chloride: 96 mmol/L — ABNORMAL LOW (ref 98–111)
Creatinine, Ser: 0.93 mg/dL (ref 0.44–1.00)
GFR calc Af Amer: >60 mL/min (ref >60)
GFR calc non Af Amer: >60 mL/min (ref >60)
Glucose, Bld: 346 mg/dL — ABNORMAL HIGH (ref 70–99)
Potassium: 4.7 mmol/L (ref 3.5–5.1)
Sodium: 131 mmol/L — ABNORMAL LOW (ref 135–145)

## 2019-02-01 LAB — CBC
HCT: 32.4 % — ABNORMAL LOW (ref 36.0–46.0)
Hemoglobin: 10.1 g/dL — ABNORMAL LOW (ref 12.0–15.0)
MCH: 27.4 pg (ref 26.0–34.0)
MCHC: 31.2 g/dL (ref 30.0–36.0)
MCV: 87.8 fL (ref 80.0–100.0)
Platelets: 380 10*3/uL (ref 150–400)
RBC: 3.69 MIL/uL — ABNORMAL LOW (ref 3.87–5.11)
RDW: 16.2 % — ABNORMAL HIGH (ref 11.5–15.5)
WBC: 12.1 10*3/uL — ABNORMAL HIGH (ref 4.0–10.5)
nRBC: 0 % (ref 0.0–0.2)

## 2019-02-01 LAB — SURGICAL PATHOLOGY

## 2019-02-01 LAB — GLUCOSE, CAPILLARY
Glucose-Capillary: 328 mg/dL — ABNORMAL HIGH (ref 70–99)
Glucose-Capillary: 185 mg/dL — ABNORMAL HIGH (ref 70–99)
Glucose-Capillary: 178 mg/dL — ABNORMAL HIGH (ref 70–99)

## 2019-02-01 MED ORDER — WARFARIN SODIUM 5 MG PO TABS
10.0000 mg | ORAL_TABLET | Freq: Once | ORAL | Status: AC
  Administered 2019-02-01: 10 mg via ORAL
  Filled 2019-02-01: qty 2

## 2019-02-01 MED ORDER — INSULIN ASPART 100 UNIT/ML ~~LOC~~ SOLN
5.0000 [IU] | Freq: Three times a day (TID) | SUBCUTANEOUS | Status: DC
  Administered 2019-02-01 – 2019-02-02 (×4): 5 [IU] via SUBCUTANEOUS

## 2019-02-01 MED ORDER — INSULIN GLARGINE 100 UNIT/ML ~~LOC~~ SOLN
25.0000 [IU] | Freq: Every day | SUBCUTANEOUS | Status: DC
  Administered 2019-02-01 – 2019-02-02 (×2): 25 [IU] via SUBCUTANEOUS
  Filled 2019-02-01 (×2): qty 0.25

## 2019-02-01 NOTE — Progress Notes (Signed)
Physical Therapy Treatment Patient Details Name: Kaitlyn Jackson MRN: [id number] DOB: 26-Sep-1945 Today's Date: 02/01/2019    History of Present Illness Patient is 73 y.o. female s/p Rt TKR on 01/31/19 with PMH significant for DM, OA, HTN.    PT Comments    POD # 1 pm session Assisted OOB.  General bed mobility comments: demonstrated how to use belt to self assist LE off and onto bed. General Gait Details: 25% VC's on proper walker to self distance and 50% VC's on proper PWB restriction. Pt plans to go home via PTAR due to having 8 steps to enter and she is only 50% WBing.   Follow Up Recommendations  Follow surgeon's recommendation for DC plan and follow-up therapies;Home health PT     Equipment Recommendations  None recommended by PT(has a walker, has a 3:1 and shower bars)    Recommendations for Other Services       Precautions / Restrictions Precautions Precautions: Fall Restrictions Weight Bearing Restrictions: Yes RLE Partial Weight Bearing Percentage or Pounds: 50%    Mobility  Bed Mobility Overal bed mobility: Needs Assistance Bed Mobility: Supine to Sit;Sit to Supine     Supine to sit: Min assist;HOB elevated Sit to supine: Min assist;Mod assist   General bed mobility comments: demonstrated how to use belt to self assist LE off and onto bed.  Transfers Overall transfer level: Needs assistance Equipment used: Rolling walker (2 wheeled)   Sit to Stand: Min guard         General transfer comment: 25% VC's on proper walker to self distance and 50% VC's on proper PWB restriction  Ambulation/Gait Ambulation/Gait assistance: Min guard Gait Distance (Feet): 38 Feet Assistive device: Rolling walker (2 wheeled) Gait Pattern/deviations: Step-through pattern;Decreased step length - right;Decreased stance time - right;Decreased stride length;Decreased weight shift to right;Wide base of support Gait velocity: decreased   General Gait Details: 25% VC's on  proper walker to self distance and 50% VC's on proper PWB restriction   Stairs             Wheelchair Mobility    Modified Rankin (Stroke Patients Only)       Balance                                            Cognition Arousal/Alertness: Awake/alert Behavior During Therapy: WFL for tasks assessed/performed Overall Cognitive Status: Within Functional Limits for tasks assessed                                        Exercises   Total Knee Replacement TE's 10 reps B LE ankle pumps 10 reps towel squeezes 10 reps knee presses 10 reps heel slides  10 reps SAQ's 10 reps SLR's 10 reps ABD Followed by ICE     General Comments        Pertinent Vitals/Pain Pain Assessment: 0-10 Pain Score: 6  Pain Location: Rt knee Pain Descriptors / Indicators: Aching;Sore Pain Intervention(s): Monitored during session;Premedicated before session;Repositioned;Ice applied    Home Living                      Prior Function            PT Goals (current goals can now be found in the  care plan section) Progress towards PT goals: Progressing toward goals    Frequency    7X/week      PT Plan Current plan remains appropriate    Co-evaluation              AM-PAC PT "6 Clicks" Mobility   Outcome Measure  Help needed turning from your back to your side while in a flat bed without using bedrails?: A Little Help needed moving from lying on your back to sitting on the side of a flat bed without using bedrails?: A Little Help needed moving to and from a bed to a chair (including a wheelchair)?: A Little Help needed standing up from a chair using your arms (e.g., wheelchair or bedside chair)?: A Little Help needed to walk in hospital room?: A Little Help needed climbing 3-5 steps with a railing? : Total 6 Click Score: 16    End of Session Equipment Utilized During Treatment: Gait belt Activity Tolerance: Patient tolerated  treatment well Patient left: in bed;with call bell/phone within reach Nurse Communication: Mobility status PT Visit Diagnosis: Muscle weakness (generalized) (M62.81);Difficulty in walking, not elsewhere classified (R26.2)     Time: 1432-1500 PT Time Calculation (min) (ACUTE ONLY): 28 min  Charges:  $Gait Training: 8-22 mins $Therapeutic Exercise: 8-22 mins                     Rica Koyanagi  PTA Acute  Rehabilitation Services Pager      [phone] Office      [phone]

## 2019-02-01 NOTE — Op Note (Signed)
PATIENT ID: Kaitlyn Jackson        MRN:  [id number]          DOB/AGE: 75-Mar-1947 / 75 y.o.    Joni Fears, MD   Biagio Borg, PA-C [address] [place], [place]  [address]                             [phone]   PROGRESS NOTE  Subjective:  negative for Chest Pain  negative for Shortness of Breath  negative for Nausea/Vomiting   negative for Calf Pain    Tolerating Diet: yes         Patient reports pain as moderate.     Pain controlled relatively well with present meds  Objective: Vital signs in last 24 hours:    Patient Vitals for the past 24 hrs:  BP Temp Temp src Pulse Resp SpO2 Height Weight  02/01/19 0549 137/63 98.1 F (36.7 C) Oral 87 16 96 % - -  02/01/19 0120 (!) 141/65 98 F (36.7 C) Oral 99 16 95 % - -  01/31/19 2126 (!) 148/69 98.4 F (36.9 C) Oral 95 19 95 % - -  01/31/19 2118 - - - 95 - 96 % - -  01/31/19 1845 132/68 98.4 F (36.9 C) Oral 80 - 98 % - -  01/31/19 1813 - - - - - - 5\' 4"  (1.626 m) 108.6 kg  01/31/19 1745 134/66 98.6 F (37 C) Oral 74 15 98 % - -  01/31/19 1641 138/62 98.1 F (36.7 C) Oral 79 15 98 % - -  01/31/19 1543 136/61 98.2 F (36.8 C) Oral 76 15 98 % - -  01/31/19 1439 (!) 146/67 (!) 97.5 F (36.4 C) Oral 71 15 97 % - -  01/31/19 1415 - 98.3 F (36.8 C) - - - - - -  01/31/19 1400 - - - 72 (!) 22 94 % - -  01/31/19 1345 135/60 98 F (36.7 C) - 67 19 93 % - -  01/31/19 1315 (!) 135/58 - - 66 12 92 % - -  01/31/19 1300 - 98.3 F (36.8 C) - - - - - -  01/31/19 1230 (!) 122/53 98.3 F (36.8 C) - 67 15 91 % - -  01/31/19 1215 (!) 124/59 - - 66 15 91 % - -  01/31/19 1200 132/60 98.3 F (36.8 C) - 67 19 90 % - -  01/31/19 1138 129/60 98.3 F (36.8 C) - 72 15 100 % - -      Intake/Output from previous day:   11/17 0701 - 11/18 0700 In: 3496.9 [P.O.:480; I.V.:2866.9] Out: 3925 [Urine:3775]   Intake/Output this shift:   11/18 0701 - 11/18 1900 In: -  Out: 100 [Urine:100]   Intake/Output      11/17 0701 -  11/18 0700 11/18 0701 - 11/19 0700   P.O. 480    I.V. (mL/kg) 2866.9 (26.4)    IV Piggyback 150    Total Intake(mL/kg) 3496.9 (32.2)    Urine (mL/kg/hr) 3775 (1.4) 100 (1.4)   Stool  0   Blood 150    Total Output 3925 100   Net -428.1 -100        Stool Occurrence  0 x      LABORATORY DATA: Recent Labs    01/25/19 1157 01/31/19 1500 02/01/19 0451  WBC 9.3 12.3* 12.1*  HGB 11.6* 10.6* 10.1*  HCT 38.0 34.9* 32.4*  PLT 403* 373 380   Recent Labs    01/25/19 1157 01/31/19 1500 02/01/19 0451  NA 138  --  131*  K 4.3  --  4.7  CL 104  --  96*  CO2 25  --  25  BUN 23  --  18  CREATININE 0.92 0.93 0.93  GLUCOSE 172*  --  346*  CALCIUM 8.7*  --  8.2*   Lab Results  Component Value Date   INR 1.1 02/01/2019   INR 1.1 01/31/2019   INR 3.8 (H) 01/25/2019    Recent Radiographic Studies :  Dg Chest 2 View  Result Date: 01/25/2019 CLINICAL DATA:  73 year old female with preop radiograph. EXAM: CHEST - 2 VIEW COMPARISON:  Chest radiograph dated 01/09/2017. FINDINGS: The lungs are clear. There is no pleural effusion or pneumothorax. The cardiac silhouette is within normal limits. No acute osseous pathology. IMPRESSION: No active cardiopulmonary disease. Electronically Signed   By: Anner Crete M.D.   On: 01/25/2019 14:59   Dg C-arm 1-60 Min-no Report  Result Date: 01/31/2019 Fluoroscopy was utilized by the requesting physician.  No radiographic interpretation.     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)   S/P  revision of total knee, right  Hyponatremia   Plan: Physical Therapy as ordered Partial Weight Bearing @ 50% (PWB)  DVT Prophylaxis:  Lovenox, Coumadin and TED hose  DISCHARGE PLAN: Home  DISCHARGE NEEDS: HHPT, CPM, Walker and 3-in-1 comode seat  Anticipated LOS equal to or greater than 2 midnights due to - Age 75 and older with one or more of the following:  - Obesity  - Expected need for hospital services (PT, OT, Nursing) required for safe  discharge  - Anticipated need for postoperative skilled nursing care or inpatient rehab  - Active co-morbidities: Diabetes and Coronary Artery Disease OR   - Unanticipated findings during/Post Surgery: Slow post-op progression: GI, pain control, mobility  - Patient is a high risk of re-admission due to: Barriers to post-acute care (logistical, no family support in home) Will need at least another day of PT,  D/C foley, saline lock IV, check lab in am, social services to assist with home needs      Biagio Borg, Hershal Coria [place]  02/01/2019 7:39 AM

## 2019-02-01 NOTE — Progress Notes (Signed)
Inpatient Diabetes Program Recommendations  AACE/ADA: New Consensus Statement on Inpatient Glycemic Control (2015)  Target Ranges:  Prepandial:   less than 140 mg/dL      Peak postprandial:   less than 180 mg/dL (1-2 hours)      Critically ill patients:  140 - 180 mg/dL   Lab Results  Component Value Date   [id number] (H) 02/01/2019   HGBA1C 8.8 (H) 01/31/2019    Review of Glycemic Control  Diabetes history: DM2 Outpatient Diabetes medications: 75/25 50 units QAM, Janumet 50/1000 mg QD Current orders for Inpatient glycemic control: Novolog 0-15 units tidwc and 0-5 units QHS  HgbA1C - 8.8% - needs tighter control for healing Will need part of long-acting insulin from 75/25 home dose.   CBGs 11/17: 152, 320, 342, 256, 288, 368 CBGs 11/18: 346, 328   Inpatient Diabetes Program Recommendations:     Lantus 25 units Q24H Add Novolog 5 units tidwc for meal coverage insulin if pt eats > 50% meal.  Will continue to follow. Secure text to RN.  Thank you. Lorenda Peck, RD, LDN, CDE Inpatient Diabetes Coordinator [phone]

## 2019-02-01 NOTE — TOC Initial Note (Addendum)
Transition of Care [facility]) - Initial/Assessment Note    Patient Details  Name: Kaitlyn Jackson MRN: [id number] Date of Birth: August 28, 1945  Transition of Care [facility]) CM/SW Contact:    Lia Hopping, [place] Phone Number: 02/01/2019, 3:27 PM  Clinical Narrative:                 CSW met with the patient at beside to discuss Home Health options. [facility], already prearranged with the patient in the orthopedic office. Patient reports her daughter can only provide limited support. She reports her daughter is gone during the day because she works and her spouse has health issues.The patient is concern she may need additional assistance with getting up and going to bathroom, and assistance with bathing. Patient reports she is interested in having permanent personal care services at home. CSW explain the difference between [facility] and personal care services. CSW explain to the patient personal care services (PCS) at home are not covered by by [employer] but are covered by Medicaid. Patient understands physical therapy and a nurse aide will not come out to the home daily. Patient reports understanding. Patient express "I made progress with physical therapy today." Patient is hopeful she will continue to make progress with therapy and regain her independence.  CSW provided the patient with a PCS application for her primary care physician to complete.   CSW discussed the options for a Home Health aide to assist with bathing a few times a week. Patient is agreeable.  CSW notified the physician for order. CSW notified the [place]. Michael with [facility].   Expected Discharge Plan: (Prearranged in Orthopedic Office) Barriers to Discharge: No Barriers Identified   Patient Goals and CMS Choice   CMS Medicare.gov Compare Post Acute Care list provided to:: Patient    Expected Discharge Plan and Services Expected Discharge Plan: (Prearranged in  Orthopedic Office) In-house Referral: Clinical Social Work Discharge Planning Services: Other - See comment(PCS Application) Post Acute Care Choice: Home Health Living arrangements for the past 2 months: [place]: PT, Nurse's Aide [place] Agency: [facility] [employer] [facility] [employer] Date [place]: 02/01/19 Time [place]: 65 Representative spoke with at [place]: Legrand Como  Prior Living Arrangements/Services Living arrangements for the past 2 months: [place] Lives with:: Spouse, Adult Children Patient language and need for interpreter reviewed:: No Do you feel safe going back to the place where you live?: No      Need for Family Participation in Patient Care: Yes (Comment) Care giver support system in place?: Yes (comment)(Spouse and daughter will assist with her care.) Current home services: DME Criminal Activity/Legal Involvement Pertinent to Current Situation/Hospitalization: No - Comment as needed  Activities of Daily Living Home Assistive Devices/Equipment: Gilford Rile (specify type) ADL Screening (condition at time of admission) Patient's cognitive ability adequate to safely complete daily activities?: Yes Is the patient deaf or have difficulty hearing?: No Does the patient have difficulty seeing, even when wearing glasses/contacts?: No Does the patient have difficulty concentrating, remembering, or making decisions?: No Patient able to express need for assistance with ADLs?: Yes Does the patient have difficulty dressing or bathing?: No Independently performs ADLs?: Yes (appropriate for developmental age) Does the patient have difficulty walking or climbing stairs?: No Weakness  of Legs: None Weakness of Arms/Hands: None  Permission Sought/Granted Permission sought to share information with : Facility Sport and exercise psychologist, Family Supports Permission granted to share  information with : Yes, Verbal Permission Granted     Permission granted to share info w AGENCY: [facility]        Emotional Assessment Appearance:: Appears stated age Attitude/Demeanor/Rapport: Engaged Affect (typically observed): Accepting, Pleasant Orientation: : Oriented to Self, Oriented to Place, Oriented to  Time, Oriented to Situation Alcohol / Substance Use: Not Applicable Psych Involvement: No (comment)  Admission diagnosis:  right loosened total knee replacement Patient Active Problem List   Diagnosis Date Noted  . S/P revision of total knee, right 01/31/2019  . 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 Sleep apnea 01/31/2018  . Atrophic vaginitis 01/31/2018  . Hyponatremia 01/31/2018  . Increased body mass index 01/31/2018  . Osteoarthritis 01/31/2018  . Postmenopausal bleeding 01/31/2018  . Systemic lupus erythematosus ([place]) 01/31/2018  . Uterine leiomyoma 01/31/2018  . Trigger middle finger of left hand 01/11/2017  . 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 High risk medication use 01/06/2016  . 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 Vitamin D deficiency 01/06/2016  . Deep venous thrombosis of lower extremity ([place]) 06/04/2015  . Osteopenia 03/14/2012  . Diabetes mellitus type 2 in obese ([place]) 03/14/2012  . 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 Hypertension 03/14/2012   PCP:  Lucianne Lei, MD Pharmacy:   [id number] [place]  [id number]: [phone]: [phone]    Social Determinants of Health (SDOH) Interventions    Readmission Risk Interventions No flowsheet data found.

## 2019-02-01 NOTE — Progress Notes (Signed)
ANTICOAGULATION CONSULT NOTE - Follow Up Consult  Pharmacy Consult for warfarin Indication: hx DVT  Allergies  Allergen Reactions  . Iodine Itching    Had it injected in vein for eye exam (so eye doctor could take pictures) Patient states she had itching of skin that evening and burning of the tops of her feet    Patient Measurements: Height: 5\' 4"  (162.6 cm) Weight: 239 lb 6.7 oz (108.6 kg) IBW/kg (Calculated) : 54.7 Heparin Dosing Weight:   Vital Signs: Temp: 98.1 F (36.7 C) (11/18 0549) Temp Source: Oral (11/18 0549) BP: 137/63 (11/18 0549) Pulse Rate: 87 (11/18 0549)  Labs: Recent Labs    01/31/19 0545 01/31/19 1500 02/01/19 0451  HGB  --  10.6* 10.1*  HCT  --  34.9* 32.4*  PLT  --  373 380  LABPROT 14.0  --  14.1  INR 1.1  --  1.1  CREATININE  --  0.93 0.93    Estimated Creatinine Clearance: 63 mL/min (by C-G formula based on SCr of 0.93 mg/dL).   Medications:  - PTA warfarin regimen: 10 mg alternating with 7.5 mg daily  Assessment: Patient is a 73 y.o F with hx DVT on warfarin PTA presented to [facility] on 11/17 for right total knee revision of tibial and patella components. Warfarin resumed post-op and lovenox 30 mg daily ordered ortho team.  Today, 02/01/2019: - INR is sub-therapeutic at 1.1 - hgb down 10.1, plts ok - no bleeding documented - no signif. Drug-drug intxns  Goal of Therapy:  INR 2-3 Monitor platelets by anticoagulation protocol: Yes   Plan:  - warfarin 10 mg PO x1 today - per ortho, continue lovenox 30 mg daily until INR > 2 - monitor for s/s bleeding  Milus Fritze P 02/01/2019,7:31 AM

## 2019-02-01 NOTE — Anesthesia Postprocedure Evaluation (Signed)
Anesthesia Post Note  [name]  Procedure(s) Performed: RIGHT TOTAL KNEE REVISION (Right Knee)     Patient location during evaluation: PACU Anesthesia Type: General Level of consciousness: awake and alert Pain management: pain level controlled Vital Signs Assessment: post-procedure vital signs reviewed and stable Respiratory status: spontaneous breathing, nonlabored ventilation, respiratory function stable and patient connected to nasal cannula oxygen Cardiovascular status: blood pressure returned to baseline and stable Postop Assessment: no apparent nausea or vomiting Anesthetic complications: no    Last Vitals:  Vitals:   02/01/19 0549 02/01/19 0921  BP: 137/63 (!) 149/66  Pulse: 87 88  Resp: 16   Temp: 36.7 C   SpO2: 96%     Last Pain:  Vitals:   02/01/19 0922  TempSrc:   PainSc: 8                  Tiajuana Amass

## 2019-02-01 NOTE — Progress Notes (Signed)
Joni Fears, MD was paged regarding the Diabetes Coordinator's recommendations, which includes Lantus 25 units Q24H and Novolog 5 units tidwc for meal coverage insulin if pt eats > 50% meal.  Durward Fortes, MD gave verbal orders for the [facility] recommendations. Will CTM.

## 2019-02-02 LAB — BASIC METABOLIC PANEL
Anion gap: 11 (ref 5–15)
BUN: 19 mg/dL (ref 8–23)
CO2: 25 mmol/L (ref 22–32)
Calcium: 8.1 mg/dL — ABNORMAL LOW (ref 8.9–10.3)
Chloride: 93 mmol/L — ABNORMAL LOW (ref 98–111)
Creatinine, Ser: 0.98 mg/dL (ref 0.44–1.00)
GFR calc Af Amer: >60 mL/min (ref >60)
GFR calc non Af Amer: 56 mL/min — ABNORMAL LOW (ref >60)
Glucose, Bld: 224 mg/dL — ABNORMAL HIGH (ref 70–99)
Potassium: 4.2 mmol/L (ref 3.5–5.1)
Sodium: 129 mmol/L — ABNORMAL LOW (ref 135–145)

## 2019-02-02 LAB — CBC
HCT: 32 % — ABNORMAL LOW (ref 36.0–46.0)
Hemoglobin: 10 g/dL — ABNORMAL LOW (ref 12.0–15.0)
MCH: 27.8 pg (ref 26.0–34.0)
MCHC: 31.3 g/dL (ref 30.0–36.0)
MCV: 88.9 fL (ref 80.0–100.0)
Platelets: 355 10*3/uL (ref 150–400)
RBC: 3.6 MIL/uL — ABNORMAL LOW (ref 3.87–5.11)
RDW: 16.6 % — ABNORMAL HIGH (ref 11.5–15.5)
WBC: 11.9 10*3/uL — ABNORMAL HIGH (ref 4.0–10.5)
nRBC: 0 % (ref 0.0–0.2)

## 2019-02-02 LAB — PROTIME-INR
INR: 1.2 (ref 0.8–1.2)
Prothrombin Time: 15.2 seconds (ref 11.4–15.2)

## 2019-02-02 LAB — GLUCOSE, CAPILLARY
Glucose-Capillary: 235 mg/dL — ABNORMAL HIGH (ref 70–99)
Glucose-Capillary: 239 mg/dL — ABNORMAL HIGH (ref 70–99)

## 2019-02-02 MED ORDER — OXYCODONE HCL 5 MG PO TABS: 5.0000 mg | ORAL_TABLET | ORAL | 0 refills | Status: DC | PRN

## 2019-02-02 MED ORDER — ENOXAPARIN (LOVENOX) PATIENT EDUCATION KIT
PACK | Freq: Once | Status: AC
  Administered 2019-02-02: 13:00:00
  Filled 2019-02-02: qty 1

## 2019-02-02 MED ORDER — ACETAMINOPHEN 325 MG PO TABS: 650.0000 mg | ORAL_TABLET | Freq: Four times a day (QID) | ORAL | Status: DC | PRN

## 2019-02-02 MED ORDER — WARFARIN SODIUM 5 MG PO TABS: 10.0000 mg | ORAL_TABLET | Freq: Once | ORAL | Status: DC

## 2019-02-02 MED ORDER — ENOXAPARIN SODIUM 30 MG/0.3ML ~~LOC~~ SOLN: 30.0000 mg | SUBCUTANEOUS | 0 refills | Status: DC

## 2019-02-02 MED ORDER — METHOCARBAMOL 500 MG PO TABS: 500.0000 mg | ORAL_TABLET | Freq: Three times a day (TID) | ORAL | 0 refills | Status: DC | PRN

## 2019-02-02 NOTE — H&P (Signed)
PATIENT ID: TYREISHA FUNKHOUSER        MRN:  [id number]          DOB/AGE: 1945-12-02 / 73 y.o.    Joni Fears, MD   Biagio Borg, PA-C [address] [place], [place]  [address]                             [phone]   PROGRESS NOTE  Subjective:  negative for Chest Pain  negative for Shortness of Breath  negative for Nausea/Vomiting   negative for Calf Pain    Tolerating Diet: yes         Patient reports pain as 7 on 0-10 scale.     No related problems other than moderate pain  Objective: Vital signs in last 24 hours:    Patient Vitals for the past 24 hrs:  BP Temp Temp src Pulse Resp SpO2  02/02/19 1042 (!) 138/56 - - 87 - -  02/02/19 0604 127/66 98.7 F (37.1 C) Oral 93 16 96 %  02/01/19 2126 (!) 136/57 97.6 F (36.4 C) Oral 88 16 94 %  02/01/19 1413 (!) 133/58 97.9 F (36.6 C) Oral 78 16 97 %      Intake/Output from previous day:   11/18 0701 - 11/19 0700 In: 1355.9 [P.O.:1140; I.V.:165.9] Out: 2050 [Urine:2050]   Intake/Output this shift:   11/19 0701 - 11/19 1900 In: 60 [P.O.:60] Out: -    Intake/Output      11/18 0701 - 11/19 0700 11/19 0701 - 11/20 0700   P.O. 1140 60   I.V. (mL/kg) 165.9 (1.5) 0 (0)   IV Piggyback 50 0   Total Intake(mL/kg) 1355.9 (12.5) 60 (0.6)   Urine (mL/kg/hr) 2050 (0.8)    Emesis/NG output 0    Stool 0    Blood     Total Output 2050    Net -694.1 +60        Urine Occurrence 0 x 0 x   Stool Occurrence 0 x 0 x   Emesis Occurrence 0 x       LABORATORY DATA: Recent Labs    01/31/19 1500 02/01/19 0451 02/02/19 0318  WBC 12.3* 12.1* 11.9*  HGB 10.6* 10.1* 10.0*  HCT 34.9* 32.4* 32.0*  PLT 373 380 355   Recent Labs    01/31/19 1500 02/01/19 0451 02/02/19 0318  NA  --  131* 129*  K  --  4.7 4.2  CL  --  96* 93*  CO2  --  25 25  BUN  --  18 19  CREATININE 0.93 0.93 0.98  GLUCOSE  --  346* 224*  CALCIUM  --  8.2* 8.1*   Lab Results  Component Value Date   INR 1.2 02/02/2019   INR 1.1 02/01/2019   INR 1.1 01/31/2019    Recent Radiographic Studies :  Dg Chest 2 View  Result Date: 01/25/2019 CLINICAL DATA:  73 year old female with preop radiograph. EXAM: CHEST - 2 VIEW COMPARISON:  Chest radiograph dated 01/09/2017. FINDINGS: The lungs are clear. There is no pleural effusion or pneumothorax. The cardiac silhouette is within normal limits. No acute osseous pathology. IMPRESSION: No active cardiopulmonary disease. Electronically Signed   By: Anner Crete M.D.   On: 01/25/2019 14:59   Dg C-arm 1-60 Min-no Report  Result Date: 01/31/2019 Fluoroscopy was utilized by the requesting physician.  No radiographic interpretation.     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)   S/P revision of total knee, right  Acute Blood Loss Anemia-asymptomatic Hyponatremia-asymptomatic  Plan: Physical Therapy as ordered Partial Weight Bearing @ 50% (PWB)  DVT Prophylaxis:  Lovenox, Coumadin and TED hose  DISCHARGE PLAN: Home  DISCHARGE NEEDS: HHPT, CPM, Walker and 3-in-1 comode seat , home health aide Anticipated LOS equal to or greater than 2 midnights due to - Age 32 and older with one or more of the following:  - Obesity  - Expected need for hospital services (PT, OT, Nursing) required for safe  discharge  - Anticipated need for postoperative skilled nursing care or inpatient rehab  - diabetes and CAD, prior hx DVT on coumadin OR   - Unanticipated findings during/Post Surgery: Slow  post-op progression: GI, pain control, mobility  - Patient is a high risk of re-admission due to: None  OOB with PT, discharge today. lovenox for 2 more days and continue coumadin regimen at home, office 2 weeks      Biagio Borg, PA-C [place]  02/02/2019 10:55 AM

## 2019-02-02 NOTE — Progress Notes (Signed)
Pt was provided with d/c instructions. Pt educated regarding the use of Lovenox. Pt denied any questions or concerns. After discussing the pt's plan of care upon d/c home, the pt reported no further questions or concerns.

## 2019-02-02 NOTE — Progress Notes (Signed)
Physical Therapy Treatment Patient Details Name: Kaitlyn Jackson MRN: [id number] DOB: 17-Jul-1945 Today's Date: 02/02/2019    History of Present Illness Patient is 73 y.o. female s/p Rt TKR on 01/31/19 with PMH significant for DM, OA, HTN.    PT Comments    POD #3 am session. Pt was willing to get up and go on a walk. General bed mobility comments: Pt OOB on BSC and went back to recliner after session General transfer comment: Pt required VC's to push up off the armrests and not to pull on the walker General Gait Details: Pt required VC's of sequencing and distance to the walker as well as PWB status Pt was able to ambulate 36ft with a RW   Follow Up Recommendations  Follow surgeon's recommendation for DC plan and follow-up therapies;Home health PT     Equipment Recommendations  None recommended by PT    Recommendations for Other Services       Precautions / Restrictions Precautions Precautions: Fall Restrictions Weight Bearing Restrictions: Yes RLE Weight Bearing: Partial weight bearing RLE Partial Weight Bearing Percentage or Pounds: 50%    Mobility  Bed Mobility               General bed mobility comments: Pt OOB on BSC and went back to recliner after session  Transfers Overall transfer level: Modified independent Equipment used: Rolling walker (2 wheeled) Transfers: Sit to/from Stand Sit to Stand: Min guard         General transfer comment: Pt required VC's to push up off the armrests and not to pull on the walker  Ambulation/Gait Ambulation/Gait assistance: Min guard Gait Distance (Feet): 47 Feet Assistive device: Rolling walker (2 wheeled) Gait Pattern/deviations: Step-through pattern;Decreased step length - right;Decreased stance time - right;Decreased stride length;Decreased weight shift to right;Wide base of support Gait velocity: decreased   General Gait Details: Pt required VC's of sequencing and distance to the walker as well as PWB  status   Stairs             Wheelchair Mobility    Modified Rankin (Stroke Patients Only)       Balance                                            Cognition Arousal/Alertness: Awake/alert Behavior During Therapy: WFL for tasks assessed/performed Overall Cognitive Status: Within Functional Limits for tasks assessed                                        Exercises      General Comments        Pertinent Vitals/Pain Pain Assessment: 0-10 Pain Score: 3  Pain Location: Rt knee Pain Descriptors / Indicators: Aching;Sore;Discomfort Pain Intervention(s): Monitored during session;Repositioned;Ice applied    Home Living                      Prior Function            PT Goals (current goals can now be found in the care plan section) Progress towards PT goals: Progressing toward goals    Frequency    7X/week      PT Plan Current plan remains appropriate    Co-evaluation  AM-PAC PT "6 Clicks" Mobility   Outcome Measure  Help needed turning from your back to your side while in a flat bed without using bedrails?: A Little Help needed moving from lying on your back to sitting on the side of a flat bed without using bedrails?: A Little Help needed moving to and from a bed to a chair (including a wheelchair)?: A Little Help needed standing up from a chair using your arms (e.g., wheelchair or bedside chair)?: A Little Help needed to walk in hospital room?: A Little Help needed climbing 3-5 steps with a railing? : Total 6 Click Score: 16    End of Session Equipment Utilized During Treatment: Gait belt Activity Tolerance: Patient tolerated treatment well Patient left: with call bell/phone within reach;in chair;with chair alarm set Nurse Communication: Mobility status PT Visit Diagnosis: Muscle weakness (generalized) (M62.81);Difficulty in walking, not elsewhere classified (R26.2)     Time:  [id number] PT Time Calculation (min) (ACUTE ONLY): 18 min  Charges:  $Gait Training: 8-22 mins                     Excell Seltzer, [facility]

## 2019-02-02 NOTE — Progress Notes (Signed)
ANTICOAGULATION CONSULT NOTE - Follow Up Consult  Pharmacy Consult for warfarin Indication: hx DVT  Allergies  Allergen Reactions  . Iodine Itching    Had it injected in vein for eye exam (so eye doctor could take pictures) Patient states she had itching of skin that evening and burning of the tops of her feet    Patient Measurements: Height: 5\' 4"  (162.6 cm) Weight: 239 lb 6.7 oz (108.6 kg) IBW/kg (Calculated) : 54.7 Heparin Dosing Weight:   Vital Signs: Temp: 98.7 F (37.1 C) (11/19 0604) Temp Source: Oral (11/19 0604) BP: 127/66 (11/19 0604) Pulse Rate: 93 (11/19 0604)  Labs: Recent Labs    01/31/19 0545  01/31/19 1500 02/01/19 0451 02/02/19 0318  HGB  --    < > 10.6* 10.1* 10.0*  HCT  --   --  34.9* 32.4* 32.0*  PLT  --   --  373 380 355  LABPROT 14.0  --   --  14.1 15.2  INR 1.1  --   --  1.1 1.2  CREATININE  --   --  0.93 0.93 0.98   < > = values in this interval not displayed.    Estimated Creatinine Clearance: 59.7 mL/min (by C-G formula based on SCr of 0.98 mg/dL).   Medications:  - PTA warfarin regimen: 10 mg alternating with 7.5 mg daily  Assessment: Patient is a 73 y.o F with hx DVT on warfarin PTA presented [facility]/17 for right total knee revision of tibial and patella components. Warfarin resumed post-op and lovenox 30 mg daily ordered by ortho team.  Today, 02/02/2019: - INR is sub-therapeutic at 1.2 - cbc somewhat stable - no bleeding documented - no signif. Drug-drug intxns - on carb modified diet  Goal of Therapy:  INR 2-3 Monitor platelets by anticoagulation protocol: Yes   Plan:  - Repeat warfarin 10 mg PO x1 today - per ortho, continue lovenox 30 mg daily until INR > 2 - monitor for s/s bleeding  Michaella Imai P 02/02/2019,7:20 AM

## 2019-02-02 NOTE — Discharge Summary (Signed)
Joni Fears, MD   Biagio Borg, PA-C                             [phone]  PATIENT ID: Kaitlyn Jackson        MRN:  [id number]          DOB/AGE: 1945-08-12 / 73 y.o.    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 Clotting disorder ([place])   . Cough 02/08/2017  . Diabetic retinopathy ([place])   . 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 Full dentures   . GERD (gastroesophageal reflux disease)   . Heart murmur   . History of blood clots 1996   groin  . History of glaucoma    had laser correction  . Hypertension    states under control with meds., has been on med. since 1996  . Insulin dependent diabetes mellitus   . Mitral valve prolapse   . Osteoarthritis    bilateral knee  . Seasonal allergies   . Sleep apnea    no CPAP use in several months, per pt.  . Trigger finger, left middle finger 01/2017    PROCEDURE: Procedure(s): RIGHT TOTAL KNEE REVISION  on 01/31/2019  CONSULTS: Pharmacy    HISTORY: Kenna Gilbert, 73 y.o. female, has a history of pain and functional disability in the right knee(s) due to failed previous arthroplasty and patient has failed non-surgical conservative treatments for greater than 12 weeks to include NSAID's and/or analgesics, use of assistive devices, weight reduction as appropriate and activity modification. The indications for the revision of the total knee arthroplasty are loosening of one or more components. Onset of symptoms was gradual starting 2 years ago with gradually worsening course since that time.  Prior procedures on the right knee(s) include arthroplasty. Patient  currently rates pain in the right knee(s) at 8 out of 10 with activity. There is night pain, worsening of pain with activity and weight bearing, pain that interferes with activities of daily living and joint swelling.  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 This condition presents safety issues increasing the risk of falls. This patient has had loosening of tibial plateau prosthesis.  There is no current active infection.  HOSPITAL COURSE:  ELSEY EBERL is a 73 y.o. admitted on 01/31/2019 and found to have a diagnosis of right loosened total knee replacement.  After appropriate laboratory studies were obtained  they were taken to the operating room on 01/31/2019 and underwent  Procedure(s): RIGHT TOTAL KNEE REVISION  .   They were given perioperative antibiotics:  Anti-infectives (From admission, onward)   Start     Dose/Rate Route Frequency Ordered Stop   01/31/19 1530  ceFAZolin (ANCEF) IVPB 2g/100 mL premix     2 g 200 mL/hr over 30 Minutes Intravenous Every 6 hours 01/31/19 1300 01/31/19 2159   01/31/19 1036  vancomycin (VANCOCIN) powder  Status:  Discontinued       As needed 01/31/19 1036 01/31/19 1436   01/31/19 0716  ceFAZolin (ANCEF) 2-4 GM/100ML-% IVPB  Note to Pharmacy: Juanda Chance : cabinet override      01/31/19 0716 01/31/19 1929    .  Tolerated the procedure well.  Placed with a foley intraoperatively.     Toradol was given post op.  POD #1, allowed out of bed to a chair.  PT for ambulation and exercise program.  Foley D/C'd in morning.  IV saline locked.  O2 discontionued. Anticoagulation managed by pharmacy  POD #2, continued PT and ambulation.    The remainder of the hospital course was dedicated to ambulation and strengthening.   The patient was discharged on 2 Days Post-Op in  Stable condition.  Blood products given:none  DIAGNOSTIC STUDIES: Recent vital signs:  Patient Vitals for the past 24 hrs:  BP Temp Temp src Pulse Resp  SpO2  02/02/19 1042 (!) 138/56 - - 87 - -  02/02/19 0604 127/66 98.7 F (37.1 C) Oral 93 16 96 %  02/01/19 2126 (!) 136/57 97.6 F (36.4 C) Oral 88 16 94 %  02/01/19 1413 (!) 133/58 97.9 F (36.6 C) Oral 78 16 97 %       Recent laboratory studies: Recent Labs    01/31/19 1500 02/01/19 0451 02/02/19 0318  WBC 12.3* 12.1* 11.9*  HGB 10.6* 10.1* 10.0*  HCT 34.9* 32.4* 32.0*  PLT 373 380 355   Recent Labs    01/31/19 1500 02/01/19 0451 02/02/19 0318  NA  --  131* 129*  K  --  4.7 4.2  CL  --  96* 93*  CO2  --  25 25  BUN  --  18 19  CREATININE 0.93 0.93 0.98  GLUCOSE  --  346* 224*  CALCIUM  --  8.2* 8.1*   Lab Results  Component Value Date   INR 1.2 02/02/2019   INR 1.1 02/01/2019   INR 1.1 01/31/2019     Recent Radiographic Studies :  Dg Chest 2 View  Result Date: 01/25/2019 CLINICAL DATA:  73 year old female with preop radiograph. EXAM: CHEST - 2 VIEW COMPARISON:  Chest radiograph dated 01/09/2017. FINDINGS: The lungs are clear. There is no pleural effusion or pneumothorax. The cardiac silhouette is within normal limits. No acute osseous pathology. IMPRESSION: No active cardiopulmonary disease. Electronically Signed   By: Anner Crete M.D.   On: 01/25/2019 14:59   Dg C-arm 1-60 Min-no Report  Result Date: 01/31/2019 Fluoroscopy was utilized by the requesting physician.  No radiographic interpretation.    DISCHARGE INSTRUCTIONS: Discharge Instructions    CPM   Complete by: As directed    Continuous passive motion machine (CPM):      Use the CPM from 0 to 60 for 6-8 hours per day.      You may increase by 5-10 degrees per day.  You may break it up into 2 or 3 sessions per day.      Use CPM for 3-4  weeks or until you are told to stop.   Call MD / Call 911   Complete by: As directed    If you experience chest pain or shortness of breath, CALL 911 and be transported to the hospital emergency room.  If you develope a fever above 101 F, pus (white drainage)  or increased drainage or redness at the wound, or calf pain, call your surgeon's office.   Change dressing   Complete by: As directed    DO NOT CHANGE YOUR DRESSING   Constipation Prevention   Complete by: As directed    Drink plenty  of fluids.  Prune juice may be helpful.  You may use a stool softener, such as Colace (over the counter) 100 mg twice a day.  Use MiraLax (over the counter) for constipation as needed.   Diet Carb Modified   Complete by: As directed    Discharge instructions   Complete by: As directed    INSTRUCTIONS AFTER JOINT REPLACEMENT   Remove items at home which could result in a fall. This includes throw rugs or furniture in walking pathways ICE to the affected joint every three hours while awake for 30 minutes at a time, for at least the first 3-5 days, and then as needed for pain and swelling.  Continue to use ice for pain and swelling. You may notice swelling that will progress down to the foot and ankle.  This is normal after surgery.  Elevate your leg when you are not up walking on it.   Continue to use the breathing machine you got in the hospital (incentive spirometer) which will help keep your temperature down.  It is common for your temperature to cycle up and down following surgery, especially at night when you are not up moving around and exerting yourself.  The breathing machine keeps your lungs expanded and your temperature down.   DIET:  As you were doing prior to hospitalization, we recommend a well-balanced diet.  DRESSING / WOUND CARE / SHOWERING  Keep the surgical dressing until follow up.  The dressing is water proof, so you can shower without any extra covering.  IF THE DRESSING FALLS OFF or the wound gets wet inside, change the dressing with sterile gauze.  Please use good hand washing techniques before changing the dressing.  Do not use any lotions or creams on the incision until instructed by your surgeon.    ACTIVITY  Increase activity slowly as  tolerated, but follow the weight bearing instructions below.   No driving for 6 weeks or until further direction given by your physician.  You cannot drive while taking narcotics.  No lifting or carrying greater than 10 lbs. until further directed by your surgeon. Avoid periods of inactivity such as sitting longer than an hour when not asleep. This helps prevent blood clots.  You may return to work once you are authorized by your doctor.     WEIGHT BEARING   Partial weight bearing with assist device as directed.  50%   EXERCISES  Results after joint replacement surgery are often greatly improved when you follow the exercise, range of motion and muscle strengthening exercises prescribed by your doctor. Safety measures are also important to protect the joint from further injury. Any time any of these exercises cause you to have increased pain or swelling, decrease what you are doing until you are comfortable again and then slowly increase them. If you have problems or questions, call your caregiver or physical therapist for advice.   Rehabilitation is important following a joint replacement. After just a few days of immobilization, the muscles of the leg can become weakened and shrink (atrophy).  These exercises are designed to build up the tone and strength of the thigh and leg muscles and to improve motion. Often times heat used for twenty to thirty minutes before working out will loosen up your tissues and help with improving the range of motion but do not use heat for the first two weeks following surgery (sometimes heat can increase post-operative swelling).   These exercises can be done on a training (exercise)  mat, on the floor, on a table or on a bed. Use whatever works the best and is most comfortable for you.    Use music or television while you are exercising so that the exercises are a pleasant break in your day. This will make your life better with the exercises acting as a break in  your routine that you can look forward to.   Perform all exercises about fifteen times, three times per day or as directed.  You should exercise both the operative leg and the other leg as well.   Exercises include:  Quad Sets - Tighten up the muscle on the front of the thigh (Quad) and hold for 5-10 seconds.   Straight Leg Raises - With your knee straight (if you were given a brace, keep it on), lift the leg to 60 degrees, hold for 3 seconds, and slowly lower the leg.  Perform this exercise against resistance later as your leg gets stronger.  Leg Slides: Lying on your back, slowly slide your foot toward your buttocks, bending your knee up off the floor (only go as far as is comfortable). Then slowly slide your foot back down until your leg is flat on the floor again.  Angel Wings: Lying on your back spread your legs to the side as far apart as you can without causing discomfort.  Hamstring Strength:  Lying on your back, push your heel against the floor with your leg straight by tightening up the muscles of your buttocks.  Repeat, but this time bend your knee to a comfortable angle, and push your heel against the floor.  You may put a pillow under the heel to make it more comfortable if necessary.   A rehabilitation program following joint replacement surgery can speed recovery and prevent re-injury in the future due to weakened muscles. Contact your doctor or a physical therapist for more information on knee rehabilitation.    CONSTIPATION  Constipation is defined medically as fewer than three stools per week and severe constipation as less than one stool per week.  Even if you have a regular bowel pattern at home, your normal regimen is likely to be disrupted due to multiple reasons following surgery.  Combination of anesthesia, postoperative narcotics, change in appetite and fluid intake all can affect your bowels.   YOU MUST use at least one of the following options; they are listed in order of  increasing strength to get the job done.  They are all available over the counter, and you may need to use some, POSSIBLY even all of these options:    Drink plenty of fluids (prune juice may be helpful) and high fiber foods Colace 100 mg by mouth twice a day  Senokot for constipation as directed and as needed Dulcolax (bisacodyl), take with full glass of water  Miralax (polyethylene glycol) once or twice a day as needed.  If you have tried all these things and are unable to have a bowel movement in the first 3-4 days after surgery call either your surgeon or your primary doctor.    If you experience loose stools or diarrhea, hold the medications until you stool forms back up.  If your symptoms do not get better within 1 week or if they get worse, check with your doctor.  If you experience "the worst abdominal pain ever" or develop nausea or vomiting, please contact the office immediately for further recommendations for treatment.   ITCHING:  If you experience itching with your medications,  try taking only a single pain pill, or even half a pain pill at a time.  You can also use Benadryl over the counter for itching or also to help with sleep.   TED HOSE STOCKINGS:  Use stockings on both legs until for at least 2 weeks or as directed by physician office. They may be removed at night for sleeping.  MEDICATIONS:  See your medication summary on the "After Visit Summary" that nursing will review with you.  You may have some home medications which will be placed on hold until you complete the course of blood thinner medication.  It is important for you to complete the blood thinner medication as prescribed.  PRECAUTIONS:  If you experience chest pain or shortness of breath - call 911 immediately for transfer to the hospital emergency department.   If you develop a fever greater that 101 F, purulent drainage from wound, increased redness or drainage from wound, foul odor from the wound/dressing, or  calf pain - CONTACT YOUR SURGEON.                                                   FOLLOW-UP APPOINTMENTS:  If you do not already have a post-op appointment, please call the office for an appointment to be seen by your surgeon.  Guidelines for how soon to be seen are listed in your "After Visit Summary", but are typically between 1-4 weeks after surgery.  OTHER INSTRUCTIONS:   Knee Replacement:  Do not place pillow under knee, focus on keeping the knee straight while resting. CPM instructions: 0-90 degrees, 2 hours in the morning, 2 hours in the afternoon, and 2 hours in the evening. Place foam block, curve side up under heel at all times except when in CPM or when walking.  DO NOT modify, tear, cut, or change the foam block in any way.  MAKE SURE YOU:  Understand these instructions.  Get help right away if you are not doing well or get worse.    Thank you for letting us be a part of your medical care team.  It is a privilege we respect greatly.  We hope these instructions will help you stay on track for a fast and full recovery!   Do not put a pillow under the knee. Place it under the heel.   Complete by: As directed    Driving restrictions   Complete by: As directed    No driving for 6 weeks   Increase activity slowly as tolerated   Complete by: As directed    Lifting restrictions   Complete by: As directed    No lifting for 6 weeks   Partial weight bearing   Complete by: As directed    % Body Weight: 50%   Laterality: right   Extremity: Lower   Patient may shower   Complete by: As directed    You may shower over the brown dressing   TED hose   Complete by: As directed    Use stockings (TED hose) for 2-3 weeks on right leg.  You may remove them at night for sleeping.      DISCHARGE MEDICATIONS:   Allergies as of 02/02/2019      Reactions   Iodine Itching   Had it injected in vein for eye exam (so eye doctor could take pictures)  Patient states she had itching of skin that  evening and burning of the tops of her feet      Medication List    STOP taking these medications   acetaminophen-codeine 300-30 MG tablet Commonly known as: TYLENOL #3   azelastine 0.1 % nasal spray Commonly known as: ASTELIN   BIOFREEZE EX   HYDROcodone-acetaminophen 5-325 MG tablet Commonly known as: NORCO/VICODIN     TAKE these medications   Accu-Chek Aviva Plus test strip Generic drug: glucose blood CHECK BLOOD GLUCOSE TWICE A DAY   acetaminophen 325 MG tablet Commonly known as: TYLENOL Take 2 tablets (650 mg total) by mouth every 6 (six) hours as needed for moderate pain. What changed:   medication strength  how much to take  reasons to take this   atorvastatin 20 MG tablet Commonly known as: LIPITOR Take 10 mg by mouth at bedtime.   benazepril 40 MG tablet Commonly known as: LOTENSIN Take 40 mg by mouth daily.   Besivance 0.6 % Susp Generic drug: Besifloxacin HCl Place 1 drop into the right eye See admin instructions. Instill one drop into right eye 4 times daily for 2 days after each monthly eye injection.   cholecalciferol 1000 units tablet Commonly known as: VITAMIN D Take 1,000 Units by mouth at bedtime.   [employer] Lancing Device Misc Accu-Chek FastClix Lancing Device   enoxaparin 30 MG/0.3ML injection Commonly known as: LOVENOX Inject 0.3 mLs (30 mg total) into the skin daily. Start taking on: February 03, 2019   esomeprazole 40 MG capsule Commonly known as: NEXIUM Take 40 mg by mouth daily before breakfast.   Fifty50 Pen Needles 31G X 8 MM Misc Generic drug: Insulin Pen Needle BD Ultra-Fine Short Pen Needle 31 gauge x 5/16"   NovoFine 30G X 8 MM Misc Generic drug: Insulin Pen Needle NovoFine 30 30 gauge x 1/3" needle   NovoFine Plus 32G X 4 MM Misc Generic drug: Insulin Pen Needle NovoFine Plus 32 gauge x 1/6" needle  USE 2 TIMES DAILY   fluticasone 50 MCG/ACT nasal spray Commonly known as: FLONASE Place 1 spray into both nostrils  daily as needed for allergies.   folic acid 1 MG tablet Commonly known as: FOLVITE Take 1 mg by mouth every evening.   furosemide 40 MG tablet Commonly known as: LASIX Take 40 mg by mouth daily.   GlucoCom Blood Glucose Monitor Devi Accu-Chek Aviva Plus Meter   HumaLOG Mix 75/25 KwikPen (75-25) 100 UNIT/ML Kwikpen Generic drug: Insulin Lispro Prot & Lispro Inject 50 Units into the skin daily.   metFORMIN 1000 MG tablet Commonly known as: GLUCOPHAGE Take 1,000 mg by mouth at bedtime.   methocarbamol 500 MG tablet Commonly known as: ROBAXIN Take 1 tablet (500 mg total) by mouth every 8 (eight) hours as needed for muscle spasms.   montelukast 10 MG tablet Commonly known as: SINGULAIR Take 10 mg by mouth at bedtime.   oxyCODONE 5 MG immediate release tablet Commonly known as: Oxy IR/ROXICODONE Take 1-2 tablets (5-10 mg total) by mouth every 4 (four) hours as needed for moderate pain or severe pain (pain score 4-6).   pregabalin 100 MG capsule Commonly known as: LYRICA Take 100 mg by mouth 2 (two) times daily.   sitaGLIPtin-metformin 50-1000 MG tablet Commonly known as: JANUMET Take 1 tablet by mouth every morning.   TRIDERMA FORTE EX Apply 1 application topically 3 (three) times daily as needed (for pain).   verapamil 180 MG CR tablet Commonly known as: CALAN-SR  Take 180 mg by mouth 2 (two) times daily.   vitamin C 500 MG tablet Commonly known as: ASCORBIC ACID Take 500 mg by mouth daily.   warfarin 7.5 MG tablet Commonly known as: COUMADIN Take 7.5 mg by mouth See admin instructions. Take 1 tablet by mouth every other day in the evening, alternating with 10mg  tablet   warfarin 10 MG tablet Commonly known as: COUMADIN Take 10 mg by mouth See admin instructions. Take 1 tablet by mouth every other day in the evening, alternating with 7.5mg  tablet            Durable Medical Equipment  (From admission, onward)         Start     Ordered   01/31/19 1300   DME Walker rolling  Once    Question:  Patient needs a walker to treat with the following condition  Answer:  Status post revision of total knee replacement, right   01/31/19 1300   01/31/19 1300  DME 3 n 1  Once     01/31/19 1300   01/31/19 1300  DME Bedside commode  Once    Question:  Patient needs a bedside commode to treat with the following condition  Answer:  S/P revision of total knee, right   01/31/19 1300           Discharge Care Instructions  (From admission, onward)         Start     Ordered   02/02/19 0000  Partial weight bearing    Question Answer Comment  % Body Weight 50%   Laterality right   Extremity Lower      02/02/19 1109   02/02/19 0000  Change dressing    Comments: DO NOT CHANGE YOUR DRESSING   02/02/19 1109          FOLLOW UP VISIT:    DISPOSITION:   Home with Home Health Aid   CONDITION:  Stable   Mike Craze. [place], [place] [phone]  02/02/2019 11:09 AM

## 2019-02-02 NOTE — TOC Transition Note (Signed)
Transition of Care [facility]) - CM/SW Discharge Note   Patient Details  Name: ARIEN BLACKSTOCK MRN: [id number] Date of Birth: [birth year]  Transition [facility] Care [facility]) CM/SW Contact:  Lia Hopping, [place] Phone Number: 02/02/2019, 12:37 PM   Clinical Narrative:    Patient will transport by [place] home. Home Health agency aware of the patient discharge 11/19.  No other needs identified.    Final next level of care: [place] Barriers to Discharge: No Barriers Identified   Patient Goals and CMS Choice Patient states their goals for this hospitalization and ongoing recovery are:: Continue therapy at home CMS Medicare.gov Compare Post Acute Care list provided to:: Patient    Discharge Placement  Home                      Discharge Plan and Services In-house Referral: Clinical Social Work Discharge Planning Services: Other - See comment(PCS Application) Post Acute Care Choice: Home Health                    HH Arranged: PT, Nurse's Aide [place] Agency: [facility] (formerly [employer]) Date [place]: 02/01/19 Time [place]: [id number] Representative spoke with at [place]: [place] Interventions     Readmission Risk Interventions No flowsheet data found.

## 2019-02-03 LAB — BODY FLUID CULTURE
Culture: NO GROWTH
Gram Stain: NONE SEEN

## 2019-02-04 DIAGNOSIS — G4730 Sleep apnea, unspecified: Secondary | ICD-10-CM | POA: Diagnosis not present

## 2019-02-04 DIAGNOSIS — L930 Discoid lupus erythematosus: Secondary | ICD-10-CM | POA: Diagnosis not present

## 2019-02-04 DIAGNOSIS — Z96652 Presence of left artificial knee joint: Secondary | ICD-10-CM | POA: Diagnosis not present

## 2019-02-04 DIAGNOSIS — Z86718 Personal history of other venous thrombosis and embolism: Secondary | ICD-10-CM | POA: Diagnosis not present

## 2019-02-04 DIAGNOSIS — K219 Gastro-esophageal reflux disease without esophagitis: Secondary | ICD-10-CM | POA: Diagnosis not present

## 2019-02-04 DIAGNOSIS — J302 Other seasonal allergic rhinitis: Secondary | ICD-10-CM | POA: Diagnosis not present

## 2019-02-04 DIAGNOSIS — I051 Rheumatic mitral insufficiency: Secondary | ICD-10-CM | POA: Diagnosis not present

## 2019-02-04 DIAGNOSIS — M19041 Primary osteoarthritis, right hand: Secondary | ICD-10-CM | POA: Diagnosis not present

## 2019-02-04 DIAGNOSIS — E559 Vitamin D deficiency, unspecified: Secondary | ICD-10-CM | POA: Diagnosis not present

## 2019-02-04 DIAGNOSIS — K5900 Constipation, unspecified: Secondary | ICD-10-CM | POA: Diagnosis not present

## 2019-02-04 DIAGNOSIS — Z794 Long term (current) use of insulin: Secondary | ICD-10-CM | POA: Diagnosis not present

## 2019-02-04 DIAGNOSIS — H409 Unspecified glaucoma: Secondary | ICD-10-CM | POA: Diagnosis not present

## 2019-02-04 DIAGNOSIS — M19042 Primary osteoarthritis, left hand: Secondary | ICD-10-CM | POA: Diagnosis not present

## 2019-02-04 DIAGNOSIS — I10 Essential (primary) hypertension: Secondary | ICD-10-CM | POA: Diagnosis not present

## 2019-02-04 DIAGNOSIS — E11319 Type 2 diabetes mellitus with unspecified diabetic retinopathy without macular edema: Secondary | ICD-10-CM | POA: Diagnosis not present

## 2019-02-04 DIAGNOSIS — T84032D Mechanical loosening of internal right knee prosthetic joint, subsequent encounter: Secondary | ICD-10-CM | POA: Diagnosis not present

## 2019-02-04 DIAGNOSIS — Z7901 Long term (current) use of anticoagulants: Secondary | ICD-10-CM | POA: Diagnosis not present

## 2019-02-04 DIAGNOSIS — N952 Postmenopausal atrophic vaginitis: Secondary | ICD-10-CM | POA: Diagnosis not present

## 2019-02-04 DIAGNOSIS — I2510 Atherosclerotic heart disease of native coronary artery without angina pectoris: Secondary | ICD-10-CM | POA: Diagnosis not present

## 2019-02-05 LAB — ANAEROBIC CULTURE

## 2019-02-06 ENCOUNTER — Ambulatory Visit: Payer: Medicare Other | Admitting: Podiatry

## 2019-02-06 ENCOUNTER — Telehealth: Payer: Self-pay | Admitting: Orthopaedic Surgery

## 2019-02-06 NOTE — Telephone Encounter (Signed)
Verdis Frederickson, PT, with [facility] left a voicemail message requesting VO for the following:  HH PT for 1x a week for 1 week and 3x a week for 4 weeks.  CB#[phone].  Thank you

## 2019-02-07 ENCOUNTER — Telehealth: Payer: Self-pay | Admitting: Orthopaedic Surgery

## 2019-02-07 DIAGNOSIS — Z7901 Long term (current) use of anticoagulants: Secondary | ICD-10-CM | POA: Diagnosis not present

## 2019-02-07 DIAGNOSIS — Z794 Long term (current) use of insulin: Secondary | ICD-10-CM | POA: Diagnosis not present

## 2019-02-07 DIAGNOSIS — Z96652 Presence of left artificial knee joint: Secondary | ICD-10-CM | POA: Diagnosis not present

## 2019-02-07 DIAGNOSIS — T84032D Mechanical loosening of internal right knee prosthetic joint, subsequent encounter: Secondary | ICD-10-CM | POA: Diagnosis not present

## 2019-02-07 DIAGNOSIS — N952 Postmenopausal atrophic vaginitis: Secondary | ICD-10-CM | POA: Diagnosis not present

## 2019-02-07 DIAGNOSIS — Z86718 Personal history of other venous thrombosis and embolism: Secondary | ICD-10-CM | POA: Diagnosis not present

## 2019-02-07 DIAGNOSIS — I2510 Atherosclerotic heart disease of native coronary artery without angina pectoris: Secondary | ICD-10-CM | POA: Diagnosis not present

## 2019-02-07 DIAGNOSIS — M19041 Primary osteoarthritis, right hand: Secondary | ICD-10-CM | POA: Diagnosis not present

## 2019-02-07 DIAGNOSIS — E559 Vitamin D deficiency, unspecified: Secondary | ICD-10-CM | POA: Diagnosis not present

## 2019-02-07 DIAGNOSIS — I10 Essential (primary) hypertension: Secondary | ICD-10-CM | POA: Diagnosis not present

## 2019-02-07 DIAGNOSIS — H409 Unspecified glaucoma: Secondary | ICD-10-CM | POA: Diagnosis not present

## 2019-02-07 DIAGNOSIS — M19042 Primary osteoarthritis, left hand: Secondary | ICD-10-CM | POA: Diagnosis not present

## 2019-02-07 DIAGNOSIS — K219 Gastro-esophageal reflux disease without esophagitis: Secondary | ICD-10-CM | POA: Diagnosis not present

## 2019-02-07 DIAGNOSIS — L930 Discoid lupus erythematosus: Secondary | ICD-10-CM | POA: Diagnosis not present

## 2019-02-07 DIAGNOSIS — K5900 Constipation, unspecified: Secondary | ICD-10-CM | POA: Diagnosis not present

## 2019-02-07 DIAGNOSIS — I051 Rheumatic mitral insufficiency: Secondary | ICD-10-CM | POA: Diagnosis not present

## 2019-02-07 DIAGNOSIS — J302 Other seasonal allergic rhinitis: Secondary | ICD-10-CM | POA: Diagnosis not present

## 2019-02-07 DIAGNOSIS — G4730 Sleep apnea, unspecified: Secondary | ICD-10-CM | POA: Diagnosis not present

## 2019-02-07 DIAGNOSIS — E11319 Type 2 diabetes mellitus with unspecified diabetic retinopathy without macular edema: Secondary | ICD-10-CM | POA: Diagnosis not present

## 2019-02-07 LAB — GLUCOSE, CAPILLARY: Glucose-Capillary: 421 mg/dL — ABNORMAL HIGH (ref 70–99)

## 2019-02-07 NOTE — Telephone Encounter (Signed)
Please advise 

## 2019-02-07 NOTE — Telephone Encounter (Signed)
Kaitlyn Jackson with [facility] called in requesting verbal orders for home health aid for 1 time a week for 1 week and 2 times a week for 3 weeks. Also requesting a skilled nurse to eval and treat for med management.    [phone]

## 2019-02-07 NOTE — Telephone Encounter (Signed)
Spoke with Verdis Frederickson and told her that Dr.Whitfield is fine with her plan of care.

## 2019-02-07 NOTE — Telephone Encounter (Signed)
Ok to do

## 2019-02-08 DIAGNOSIS — I10 Essential (primary) hypertension: Secondary | ICD-10-CM | POA: Diagnosis not present

## 2019-02-08 DIAGNOSIS — N952 Postmenopausal atrophic vaginitis: Secondary | ICD-10-CM | POA: Diagnosis not present

## 2019-02-08 DIAGNOSIS — K219 Gastro-esophageal reflux disease without esophagitis: Secondary | ICD-10-CM | POA: Diagnosis not present

## 2019-02-08 DIAGNOSIS — J302 Other seasonal allergic rhinitis: Secondary | ICD-10-CM | POA: Diagnosis not present

## 2019-02-08 DIAGNOSIS — Z794 Long term (current) use of insulin: Secondary | ICD-10-CM | POA: Diagnosis not present

## 2019-02-08 DIAGNOSIS — Z86718 Personal history of other venous thrombosis and embolism: Secondary | ICD-10-CM | POA: Diagnosis not present

## 2019-02-08 DIAGNOSIS — L930 Discoid lupus erythematosus: Secondary | ICD-10-CM | POA: Diagnosis not present

## 2019-02-08 DIAGNOSIS — Z96652 Presence of left artificial knee joint: Secondary | ICD-10-CM | POA: Diagnosis not present

## 2019-02-08 DIAGNOSIS — K5900 Constipation, unspecified: Secondary | ICD-10-CM | POA: Diagnosis not present

## 2019-02-08 DIAGNOSIS — M19041 Primary osteoarthritis, right hand: Secondary | ICD-10-CM | POA: Diagnosis not present

## 2019-02-08 DIAGNOSIS — T84032D Mechanical loosening of internal right knee prosthetic joint, subsequent encounter: Secondary | ICD-10-CM | POA: Diagnosis not present

## 2019-02-08 DIAGNOSIS — Z7901 Long term (current) use of anticoagulants: Secondary | ICD-10-CM | POA: Diagnosis not present

## 2019-02-08 DIAGNOSIS — G4730 Sleep apnea, unspecified: Secondary | ICD-10-CM | POA: Diagnosis not present

## 2019-02-08 DIAGNOSIS — H409 Unspecified glaucoma: Secondary | ICD-10-CM | POA: Diagnosis not present

## 2019-02-08 DIAGNOSIS — E11319 Type 2 diabetes mellitus with unspecified diabetic retinopathy without macular edema: Secondary | ICD-10-CM | POA: Diagnosis not present

## 2019-02-08 DIAGNOSIS — M19042 Primary osteoarthritis, left hand: Secondary | ICD-10-CM | POA: Diagnosis not present

## 2019-02-08 DIAGNOSIS — I2510 Atherosclerotic heart disease of native coronary artery without angina pectoris: Secondary | ICD-10-CM | POA: Diagnosis not present

## 2019-02-08 DIAGNOSIS — I051 Rheumatic mitral insufficiency: Secondary | ICD-10-CM | POA: Diagnosis not present

## 2019-02-08 DIAGNOSIS — E559 Vitamin D deficiency, unspecified: Secondary | ICD-10-CM | POA: Diagnosis not present

## 2019-02-08 NOTE — Telephone Encounter (Signed)
Called Kaitlyn Jackson. No answer. Left message that Dr.Whitfield was fine with her plan of care.

## 2019-02-10 DIAGNOSIS — L930 Discoid lupus erythematosus: Secondary | ICD-10-CM | POA: Diagnosis not present

## 2019-02-10 DIAGNOSIS — H409 Unspecified glaucoma: Secondary | ICD-10-CM | POA: Diagnosis not present

## 2019-02-10 DIAGNOSIS — I10 Essential (primary) hypertension: Secondary | ICD-10-CM | POA: Diagnosis not present

## 2019-02-10 DIAGNOSIS — Z7901 Long term (current) use of anticoagulants: Secondary | ICD-10-CM | POA: Diagnosis not present

## 2019-02-10 DIAGNOSIS — Z794 Long term (current) use of insulin: Secondary | ICD-10-CM | POA: Diagnosis not present

## 2019-02-10 DIAGNOSIS — M19041 Primary osteoarthritis, right hand: Secondary | ICD-10-CM | POA: Diagnosis not present

## 2019-02-10 DIAGNOSIS — M19042 Primary osteoarthritis, left hand: Secondary | ICD-10-CM | POA: Diagnosis not present

## 2019-02-10 DIAGNOSIS — Z96652 Presence of left artificial knee joint: Secondary | ICD-10-CM | POA: Diagnosis not present

## 2019-02-10 DIAGNOSIS — N952 Postmenopausal atrophic vaginitis: Secondary | ICD-10-CM | POA: Diagnosis not present

## 2019-02-10 DIAGNOSIS — T84032D Mechanical loosening of internal right knee prosthetic joint, subsequent encounter: Secondary | ICD-10-CM | POA: Diagnosis not present

## 2019-02-10 DIAGNOSIS — K5900 Constipation, unspecified: Secondary | ICD-10-CM | POA: Diagnosis not present

## 2019-02-10 DIAGNOSIS — K219 Gastro-esophageal reflux disease without esophagitis: Secondary | ICD-10-CM | POA: Diagnosis not present

## 2019-02-10 DIAGNOSIS — Z86718 Personal history of other venous thrombosis and embolism: Secondary | ICD-10-CM | POA: Diagnosis not present

## 2019-02-10 DIAGNOSIS — I051 Rheumatic mitral insufficiency: Secondary | ICD-10-CM | POA: Diagnosis not present

## 2019-02-10 DIAGNOSIS — I2510 Atherosclerotic heart disease of native coronary artery without angina pectoris: Secondary | ICD-10-CM | POA: Diagnosis not present

## 2019-02-10 DIAGNOSIS — E559 Vitamin D deficiency, unspecified: Secondary | ICD-10-CM | POA: Diagnosis not present

## 2019-02-10 DIAGNOSIS — J302 Other seasonal allergic rhinitis: Secondary | ICD-10-CM | POA: Diagnosis not present

## 2019-02-10 DIAGNOSIS — G4730 Sleep apnea, unspecified: Secondary | ICD-10-CM | POA: Diagnosis not present

## 2019-02-10 DIAGNOSIS — E11319 Type 2 diabetes mellitus with unspecified diabetic retinopathy without macular edema: Secondary | ICD-10-CM | POA: Diagnosis not present

## 2019-02-13 DIAGNOSIS — M19042 Primary osteoarthritis, left hand: Secondary | ICD-10-CM | POA: Diagnosis not present

## 2019-02-13 DIAGNOSIS — K219 Gastro-esophageal reflux disease without esophagitis: Secondary | ICD-10-CM | POA: Diagnosis not present

## 2019-02-13 DIAGNOSIS — I10 Essential (primary) hypertension: Secondary | ICD-10-CM | POA: Diagnosis not present

## 2019-02-13 DIAGNOSIS — J302 Other seasonal allergic rhinitis: Secondary | ICD-10-CM | POA: Diagnosis not present

## 2019-02-13 DIAGNOSIS — M19041 Primary osteoarthritis, right hand: Secondary | ICD-10-CM | POA: Diagnosis not present

## 2019-02-13 DIAGNOSIS — I051 Rheumatic mitral insufficiency: Secondary | ICD-10-CM | POA: Diagnosis not present

## 2019-02-13 DIAGNOSIS — K5900 Constipation, unspecified: Secondary | ICD-10-CM | POA: Diagnosis not present

## 2019-02-13 DIAGNOSIS — Z86718 Personal history of other venous thrombosis and embolism: Secondary | ICD-10-CM | POA: Diagnosis not present

## 2019-02-13 DIAGNOSIS — Z7901 Long term (current) use of anticoagulants: Secondary | ICD-10-CM | POA: Diagnosis not present

## 2019-02-13 DIAGNOSIS — L930 Discoid lupus erythematosus: Secondary | ICD-10-CM | POA: Diagnosis not present

## 2019-02-13 DIAGNOSIS — E11319 Type 2 diabetes mellitus with unspecified diabetic retinopathy without macular edema: Secondary | ICD-10-CM | POA: Diagnosis not present

## 2019-02-13 DIAGNOSIS — Z96652 Presence of left artificial knee joint: Secondary | ICD-10-CM | POA: Diagnosis not present

## 2019-02-13 DIAGNOSIS — E559 Vitamin D deficiency, unspecified: Secondary | ICD-10-CM | POA: Diagnosis not present

## 2019-02-13 DIAGNOSIS — N952 Postmenopausal atrophic vaginitis: Secondary | ICD-10-CM | POA: Diagnosis not present

## 2019-02-13 DIAGNOSIS — H409 Unspecified glaucoma: Secondary | ICD-10-CM | POA: Diagnosis not present

## 2019-02-13 DIAGNOSIS — G4730 Sleep apnea, unspecified: Secondary | ICD-10-CM | POA: Diagnosis not present

## 2019-02-13 DIAGNOSIS — T84032D Mechanical loosening of internal right knee prosthetic joint, subsequent encounter: Secondary | ICD-10-CM | POA: Diagnosis not present

## 2019-02-13 DIAGNOSIS — Z794 Long term (current) use of insulin: Secondary | ICD-10-CM | POA: Diagnosis not present

## 2019-02-13 DIAGNOSIS — I2510 Atherosclerotic heart disease of native coronary artery without angina pectoris: Secondary | ICD-10-CM | POA: Diagnosis not present

## 2019-02-14 ENCOUNTER — Ambulatory Visit (INDEPENDENT_AMBULATORY_CARE_PROVIDER_SITE_OTHER): Payer: Medicare Other | Admitting: Orthopaedic Surgery

## 2019-02-14 ENCOUNTER — Other Ambulatory Visit: Payer: Self-pay

## 2019-02-14 ENCOUNTER — Encounter: Payer: Self-pay | Admitting: Orthopaedic Surgery

## 2019-02-14 ENCOUNTER — Ambulatory Visit (INDEPENDENT_AMBULATORY_CARE_PROVIDER_SITE_OTHER): Payer: Medicare Other

## 2019-02-14 VITALS — Ht 64.0 in | Wt 239.0 lb

## 2019-02-14 DIAGNOSIS — Z96651 Presence of right artificial knee joint: Secondary | ICD-10-CM

## 2019-02-14 DIAGNOSIS — G8929 Other chronic pain: Secondary | ICD-10-CM

## 2019-02-14 DIAGNOSIS — M25561 Pain in right knee: Secondary | ICD-10-CM | POA: Insufficient documentation

## 2019-02-14 MED ORDER — OXYCODONE HCL 5 MG PO CAPS: 5.0000 mg | ORAL_CAPSULE | ORAL | 0 refills | Status: DC | PRN

## 2019-02-14 MED ORDER — METHOCARBAMOL 500 MG PO TABS: ORAL_TABLET | ORAL | 0 refills | Status: DC

## 2019-02-14 NOTE — Progress Notes (Signed)
Office Visit Note   [name]           Date of Birth: [birth year]           MRN: [id number] Visit Date: 02/14/2019              Requested by: Lucianne Lei, [address] [place],  [place] [address] PCP: Lucianne Lei, MD   Assessment & Plan: Visit Diagnoses:  1. S/P total knee arthroplasty, right   2. Chronic pain of right knee     Plan: 2 weeks status post revision of tibial component and patella polyethylene component of right knee replacement.  Doing well.  No fever or chills.  Still having some pain.  Will renew OxyIR.  Can supplement this with Tylenol.  Also renew Robaxin.  New with physical therapy.  Both lower extremities are significantly weak.  May weight-bear to tolerance using her walker and return in 2 weeks. Femoral component appeared to be well seated and not loose  Follow-Up Instructions: Return in about 2 weeks (around 02/28/2019).   Orders:  Orders Placed This Encounter  Procedures   XR KNEE 3 VIEW RIGHT   Meds ordered this encounter  Medications   oxycodone (OXY-IR) 5 MG capsule    Sig: Take 1 capsule (5 mg total) by mouth every 4 (four) hours as needed.    Dispense:  30 capsule    Refill:  0   methocarbamol (ROBAXIN) 500 MG tablet    Sig: 1 PO q 8hr prn    Dispense:  20 tablet    Refill:  0      Procedures: No procedures performed   Clinical Data: No additional findings.   Subjective: Chief Complaint  Patient presents with   Right Knee - Routine Post Op    Right total knee revision DOS 01/31/2019  Patient presents today for follow up on her right knee. She had a right total knee revision on 01/31/2019. She is now two weeks out from surgery. Patient states that she is having pain. She is taking oxycodone, Robaxin, and Tylenol for pain. She is doing home therapy three times weekly.  No related fever or chills  HPI  Review of Systems   Objective: Vital Signs: Ht 5\' 4"  (1.626 m)    Wt 239 lb (108.4 kg)    BMI  41.02 kg/m   Physical Exam  Ortho Exam right knee incision healing without problem.  Clips removed and Steri-Strips applied over benzoin.  No effusion.  No opening with a varus or valgus stress.  Flexed about 95 degrees and had just about full extension.  Very weak quads with an extensor lag.  No distal edema.  Neurologically intact  Specialty Comments:  No specialty comments available.  Imaging: Xr Knee 3 View Right  Result Date: 02/14/2019 Films of the right knee were obtained in several projections.  2 weeks status post revision of tibial component of right total knee replacement with a longstem component.  Had a fracture of the medial tibial plateau that was fixed with K wires and screw.  This appears to be in good position.  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) 12/20/2018   Sleep apnea 01/31/2018  Atrophic vaginitis 01/31/2018   Hyponatremia 01/31/2018   Increased body mass index 01/31/2018   Osteoarthritis 01/31/2018   Postmenopausal bleeding 01/31/2018   Systemic lupus erythematosus ([place]) 01/31/2018   Uterine leiomyoma 01/31/2018   Trigger middle finger of left hand 01/11/2017   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   High risk medication use 01/06/2016   S/P TKR (total knee replacement), bilateral 01/06/2016   Osteoarthritis of hands, bilateral 01/06/2016   Vitamin D deficiency 01/06/2016   Deep venous thrombosis of lower extremity ([place]) 06/04/2015   Osteopenia 03/14/2012   Diabetes mellitus type 2 in obese ([place]) 03/14/2012   ASCVD (arteriosclerotic cardiovascular disease) 03/14/2012   Hypertension 03/14/2012   Past Medical History:  Diagnosis Date   Clotting disorder ([place])    Cough 02/08/2017    Diabetic retinopathy ([place])    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 since 1996   Insulin dependent diabetes mellitus    Mitral valve prolapse    Osteoarthritis    bilateral knee   Seasonal allergies    Sleep apnea    no CPAP use in several months, per pt.   Trigger finger, left middle finger 01/2017    Family History  Problem Relation Age of Onset   Cancer Mother        colon   Cancer Cousin        lung    Past Surgical History:  Procedure Laterality Date   BACK SURGERY     lower - removed  cysts   CATARACT EXTRACTION W/ INTRAOCULAR LENS  IMPLANT, BILATERAL Bilateral    GLAUCOMA SURGERY Bilateral    laser   HEMILAMINOTOMY LUMBAR SPINE Right 01/30/2009   L5   JOINT REPLACEMENT     TOTAL KNEE ARTHROPLASTY Right 09/22/2007   TOTAL KNEE ARTHROPLASTY Left 02/01/2007   TOTAL KNEE REVISION Right 01/31/2019   Procedure: RIGHT TOTAL KNEE REVISION;  Surgeon: [name], MD;  [facility];  Service: Orthopedics;  Laterality: Right;   TRIGGER FINGER RELEASE Right 12/14/2013   Procedure: RELEASE TRIGGER FINGER/A-1 PULLEY RIGHT RING FINGER;  Surgeon: Daryll Brod, MD;  Location: [facility];  Service: Orthopedics;  Laterality: Right;   TRIGGER FINGER RELEASE Left 02/11/2017   Procedure: RELEASE TRIGGER FINGER/A-1 PULLEY;  Surgeon: Leanora Cover, MD;  Location: [facility];  Service: Orthopedics;  Laterality: Left;   Social History   Occupational History   Not on file  Tobacco Use   Smoking status: Never Smoker   Smokeless tobacco: Never Used  Substance and Sexual Activity   Alcohol use: No   Drug use: No   Sexual activity: Not on file

## 2019-02-15 DIAGNOSIS — Z794 Long term (current) use of insulin: Secondary | ICD-10-CM | POA: Diagnosis not present

## 2019-02-15 DIAGNOSIS — M19042 Primary osteoarthritis, left hand: Secondary | ICD-10-CM | POA: Diagnosis not present

## 2019-02-15 DIAGNOSIS — I2510 Atherosclerotic heart disease of native coronary artery without angina pectoris: Secondary | ICD-10-CM | POA: Diagnosis not present

## 2019-02-15 DIAGNOSIS — K219 Gastro-esophageal reflux disease without esophagitis: Secondary | ICD-10-CM | POA: Diagnosis not present

## 2019-02-15 DIAGNOSIS — K5900 Constipation, unspecified: Secondary | ICD-10-CM | POA: Diagnosis not present

## 2019-02-15 DIAGNOSIS — E11319 Type 2 diabetes mellitus with unspecified diabetic retinopathy without macular edema: Secondary | ICD-10-CM | POA: Diagnosis not present

## 2019-02-15 DIAGNOSIS — I051 Rheumatic mitral insufficiency: Secondary | ICD-10-CM | POA: Diagnosis not present

## 2019-02-15 DIAGNOSIS — T84032D Mechanical loosening of internal right knee prosthetic joint, subsequent encounter: Secondary | ICD-10-CM | POA: Diagnosis not present

## 2019-02-15 DIAGNOSIS — E559 Vitamin D deficiency, unspecified: Secondary | ICD-10-CM | POA: Diagnosis not present

## 2019-02-15 DIAGNOSIS — J302 Other seasonal allergic rhinitis: Secondary | ICD-10-CM | POA: Diagnosis not present

## 2019-02-15 DIAGNOSIS — G4730 Sleep apnea, unspecified: Secondary | ICD-10-CM | POA: Diagnosis not present

## 2019-02-15 DIAGNOSIS — M19041 Primary osteoarthritis, right hand: Secondary | ICD-10-CM | POA: Diagnosis not present

## 2019-02-15 DIAGNOSIS — Z96652 Presence of left artificial knee joint: Secondary | ICD-10-CM | POA: Diagnosis not present

## 2019-02-15 DIAGNOSIS — Z86718 Personal history of other venous thrombosis and embolism: Secondary | ICD-10-CM | POA: Diagnosis not present

## 2019-02-15 DIAGNOSIS — I10 Essential (primary) hypertension: Secondary | ICD-10-CM | POA: Diagnosis not present

## 2019-02-15 DIAGNOSIS — N952 Postmenopausal atrophic vaginitis: Secondary | ICD-10-CM | POA: Diagnosis not present

## 2019-02-15 DIAGNOSIS — H409 Unspecified glaucoma: Secondary | ICD-10-CM | POA: Diagnosis not present

## 2019-02-15 DIAGNOSIS — Z7901 Long term (current) use of anticoagulants: Secondary | ICD-10-CM | POA: Diagnosis not present

## 2019-02-15 DIAGNOSIS — L930 Discoid lupus erythematosus: Secondary | ICD-10-CM | POA: Diagnosis not present

## 2019-02-16 ENCOUNTER — Telehealth: Payer: Self-pay | Admitting: Orthopaedic Surgery

## 2019-02-16 NOTE — Telephone Encounter (Signed)
Paperwork for personal care services signed by Dr. Durward Fortes, mailed to patient

## 2019-02-17 ENCOUNTER — Telehealth: Payer: Self-pay | Admitting: Orthopaedic Surgery

## 2019-02-17 DIAGNOSIS — K219 Gastro-esophageal reflux disease without esophagitis: Secondary | ICD-10-CM | POA: Diagnosis not present

## 2019-02-17 DIAGNOSIS — H409 Unspecified glaucoma: Secondary | ICD-10-CM | POA: Diagnosis not present

## 2019-02-17 DIAGNOSIS — M19041 Primary osteoarthritis, right hand: Secondary | ICD-10-CM | POA: Diagnosis not present

## 2019-02-17 DIAGNOSIS — Z96652 Presence of left artificial knee joint: Secondary | ICD-10-CM | POA: Diagnosis not present

## 2019-02-17 DIAGNOSIS — E11319 Type 2 diabetes mellitus with unspecified diabetic retinopathy without macular edema: Secondary | ICD-10-CM | POA: Diagnosis not present

## 2019-02-17 DIAGNOSIS — L930 Discoid lupus erythematosus: Secondary | ICD-10-CM | POA: Diagnosis not present

## 2019-02-17 DIAGNOSIS — N952 Postmenopausal atrophic vaginitis: Secondary | ICD-10-CM | POA: Diagnosis not present

## 2019-02-17 DIAGNOSIS — I10 Essential (primary) hypertension: Secondary | ICD-10-CM | POA: Diagnosis not present

## 2019-02-17 DIAGNOSIS — K5900 Constipation, unspecified: Secondary | ICD-10-CM | POA: Diagnosis not present

## 2019-02-17 DIAGNOSIS — J302 Other seasonal allergic rhinitis: Secondary | ICD-10-CM | POA: Diagnosis not present

## 2019-02-17 DIAGNOSIS — Z794 Long term (current) use of insulin: Secondary | ICD-10-CM | POA: Diagnosis not present

## 2019-02-17 DIAGNOSIS — M19042 Primary osteoarthritis, left hand: Secondary | ICD-10-CM | POA: Diagnosis not present

## 2019-02-17 DIAGNOSIS — Z86718 Personal history of other venous thrombosis and embolism: Secondary | ICD-10-CM | POA: Diagnosis not present

## 2019-02-17 DIAGNOSIS — T84032D Mechanical loosening of internal right knee prosthetic joint, subsequent encounter: Secondary | ICD-10-CM | POA: Diagnosis not present

## 2019-02-17 DIAGNOSIS — I051 Rheumatic mitral insufficiency: Secondary | ICD-10-CM | POA: Diagnosis not present

## 2019-02-17 DIAGNOSIS — G4730 Sleep apnea, unspecified: Secondary | ICD-10-CM | POA: Diagnosis not present

## 2019-02-17 DIAGNOSIS — I2510 Atherosclerotic heart disease of native coronary artery without angina pectoris: Secondary | ICD-10-CM | POA: Diagnosis not present

## 2019-02-17 DIAGNOSIS — Z7901 Long term (current) use of anticoagulants: Secondary | ICD-10-CM | POA: Diagnosis not present

## 2019-02-17 DIAGNOSIS — E559 Vitamin D deficiency, unspecified: Secondary | ICD-10-CM | POA: Diagnosis not present

## 2019-02-17 NOTE — Telephone Encounter (Signed)
Flor from [place] [facility] called stating that patient advised her that she has been having an increase in pain since yesterday.  The patient stated that her pain level is an 8 out of 10 and that the pain medication is not really helping.  Flor asked it we could give the patient a call to instruct her on what to do.  CB#[phone].  Thank you.

## 2019-02-18 DIAGNOSIS — H409 Unspecified glaucoma: Secondary | ICD-10-CM | POA: Diagnosis not present

## 2019-02-18 DIAGNOSIS — Z794 Long term (current) use of insulin: Secondary | ICD-10-CM | POA: Diagnosis not present

## 2019-02-18 DIAGNOSIS — L930 Discoid lupus erythematosus: Secondary | ICD-10-CM | POA: Diagnosis not present

## 2019-02-18 DIAGNOSIS — J302 Other seasonal allergic rhinitis: Secondary | ICD-10-CM | POA: Diagnosis not present

## 2019-02-18 DIAGNOSIS — I10 Essential (primary) hypertension: Secondary | ICD-10-CM | POA: Diagnosis not present

## 2019-02-18 DIAGNOSIS — M19042 Primary osteoarthritis, left hand: Secondary | ICD-10-CM | POA: Diagnosis not present

## 2019-02-18 DIAGNOSIS — N952 Postmenopausal atrophic vaginitis: Secondary | ICD-10-CM | POA: Diagnosis not present

## 2019-02-18 DIAGNOSIS — G4730 Sleep apnea, unspecified: Secondary | ICD-10-CM | POA: Diagnosis not present

## 2019-02-18 DIAGNOSIS — Z86718 Personal history of other venous thrombosis and embolism: Secondary | ICD-10-CM | POA: Diagnosis not present

## 2019-02-18 DIAGNOSIS — T84032D Mechanical loosening of internal right knee prosthetic joint, subsequent encounter: Secondary | ICD-10-CM | POA: Diagnosis not present

## 2019-02-18 DIAGNOSIS — K5900 Constipation, unspecified: Secondary | ICD-10-CM | POA: Diagnosis not present

## 2019-02-18 DIAGNOSIS — E11319 Type 2 diabetes mellitus with unspecified diabetic retinopathy without macular edema: Secondary | ICD-10-CM | POA: Diagnosis not present

## 2019-02-18 DIAGNOSIS — E559 Vitamin D deficiency, unspecified: Secondary | ICD-10-CM | POA: Diagnosis not present

## 2019-02-18 DIAGNOSIS — Z7901 Long term (current) use of anticoagulants: Secondary | ICD-10-CM | POA: Diagnosis not present

## 2019-02-18 DIAGNOSIS — I2510 Atherosclerotic heart disease of native coronary artery without angina pectoris: Secondary | ICD-10-CM | POA: Diagnosis not present

## 2019-02-18 DIAGNOSIS — I051 Rheumatic mitral insufficiency: Secondary | ICD-10-CM | POA: Diagnosis not present

## 2019-02-18 DIAGNOSIS — M19041 Primary osteoarthritis, right hand: Secondary | ICD-10-CM | POA: Diagnosis not present

## 2019-02-18 DIAGNOSIS — Z96652 Presence of left artificial knee joint: Secondary | ICD-10-CM | POA: Diagnosis not present

## 2019-02-18 DIAGNOSIS — K219 Gastro-esophageal reflux disease without esophagitis: Secondary | ICD-10-CM | POA: Diagnosis not present

## 2019-02-20 ENCOUNTER — Other Ambulatory Visit: Payer: Self-pay | Admitting: Orthopaedic Surgery

## 2019-02-20 DIAGNOSIS — I051 Rheumatic mitral insufficiency: Secondary | ICD-10-CM | POA: Diagnosis not present

## 2019-02-20 DIAGNOSIS — K219 Gastro-esophageal reflux disease without esophagitis: Secondary | ICD-10-CM | POA: Diagnosis not present

## 2019-02-20 DIAGNOSIS — H409 Unspecified glaucoma: Secondary | ICD-10-CM | POA: Diagnosis not present

## 2019-02-20 DIAGNOSIS — Z86718 Personal history of other venous thrombosis and embolism: Secondary | ICD-10-CM | POA: Diagnosis not present

## 2019-02-20 DIAGNOSIS — G4730 Sleep apnea, unspecified: Secondary | ICD-10-CM | POA: Diagnosis not present

## 2019-02-20 DIAGNOSIS — N952 Postmenopausal atrophic vaginitis: Secondary | ICD-10-CM | POA: Diagnosis not present

## 2019-02-20 DIAGNOSIS — M19042 Primary osteoarthritis, left hand: Secondary | ICD-10-CM | POA: Diagnosis not present

## 2019-02-20 DIAGNOSIS — I10 Essential (primary) hypertension: Secondary | ICD-10-CM | POA: Diagnosis not present

## 2019-02-20 DIAGNOSIS — M19041 Primary osteoarthritis, right hand: Secondary | ICD-10-CM | POA: Diagnosis not present

## 2019-02-20 DIAGNOSIS — E559 Vitamin D deficiency, unspecified: Secondary | ICD-10-CM | POA: Diagnosis not present

## 2019-02-20 DIAGNOSIS — K5900 Constipation, unspecified: Secondary | ICD-10-CM | POA: Diagnosis not present

## 2019-02-20 DIAGNOSIS — Z7901 Long term (current) use of anticoagulants: Secondary | ICD-10-CM | POA: Diagnosis not present

## 2019-02-20 DIAGNOSIS — T84032D Mechanical loosening of internal right knee prosthetic joint, subsequent encounter: Secondary | ICD-10-CM | POA: Diagnosis not present

## 2019-02-20 DIAGNOSIS — J302 Other seasonal allergic rhinitis: Secondary | ICD-10-CM | POA: Diagnosis not present

## 2019-02-20 DIAGNOSIS — Z794 Long term (current) use of insulin: Secondary | ICD-10-CM | POA: Diagnosis not present

## 2019-02-20 DIAGNOSIS — L930 Discoid lupus erythematosus: Secondary | ICD-10-CM | POA: Diagnosis not present

## 2019-02-20 DIAGNOSIS — Z96652 Presence of left artificial knee joint: Secondary | ICD-10-CM | POA: Diagnosis not present

## 2019-02-20 DIAGNOSIS — I2510 Atherosclerotic heart disease of native coronary artery without angina pectoris: Secondary | ICD-10-CM | POA: Diagnosis not present

## 2019-02-20 DIAGNOSIS — E11319 Type 2 diabetes mellitus with unspecified diabetic retinopathy without macular edema: Secondary | ICD-10-CM | POA: Diagnosis not present

## 2019-02-20 MED ORDER — OXYCODONE HCL 5 MG PO CAPS: 10.0000 mg | ORAL_CAPSULE | Freq: Four times a day (QID) | ORAL | 0 refills | Status: DC | PRN

## 2019-02-20 NOTE — Telephone Encounter (Signed)
Have her take 2 oxyIR 5mg  at a time-need to see her later in the week if still uncomfortable

## 2019-02-20 NOTE — Telephone Encounter (Signed)
Please advise 

## 2019-02-20 NOTE — Telephone Encounter (Signed)
Spoke with patient. She has been taking 2 tablets of the oxycodone and alternating with tylenol. She said that she only takes the oxycodone if she is hurting, and then she cannot get ahead of the pain. She has 4 tablets left. I called and spoke with Dr.Whitfield. He is going to refill her oxycodone and wants her to take 2 tablets every 4-6hours. If no improvement then he wants to see her Thursday morning. Patient understands and will call if needed.

## 2019-02-21 ENCOUNTER — Encounter (INDEPENDENT_AMBULATORY_CARE_PROVIDER_SITE_OTHER): Payer: Medicare Other | Admitting: Ophthalmology

## 2019-02-21 DIAGNOSIS — G4733 Obstructive sleep apnea (adult) (pediatric): Secondary | ICD-10-CM | POA: Diagnosis not present

## 2019-02-21 DIAGNOSIS — Z7901 Long term (current) use of anticoagulants: Secondary | ICD-10-CM | POA: Diagnosis not present

## 2019-02-21 DIAGNOSIS — I82409 Acute embolism and thrombosis of unspecified deep veins of unspecified lower extremity: Secondary | ICD-10-CM | POA: Diagnosis not present

## 2019-02-21 DIAGNOSIS — E034 Atrophy of thyroid (acquired): Secondary | ICD-10-CM | POA: Diagnosis not present

## 2019-02-21 DIAGNOSIS — I509 Heart failure, unspecified: Secondary | ICD-10-CM | POA: Diagnosis not present

## 2019-02-22 DIAGNOSIS — I051 Rheumatic mitral insufficiency: Secondary | ICD-10-CM | POA: Diagnosis not present

## 2019-02-22 DIAGNOSIS — H409 Unspecified glaucoma: Secondary | ICD-10-CM | POA: Diagnosis not present

## 2019-02-22 DIAGNOSIS — M19041 Primary osteoarthritis, right hand: Secondary | ICD-10-CM | POA: Diagnosis not present

## 2019-02-22 DIAGNOSIS — K5900 Constipation, unspecified: Secondary | ICD-10-CM | POA: Diagnosis not present

## 2019-02-22 DIAGNOSIS — G4730 Sleep apnea, unspecified: Secondary | ICD-10-CM | POA: Diagnosis not present

## 2019-02-22 DIAGNOSIS — Z96652 Presence of left artificial knee joint: Secondary | ICD-10-CM | POA: Diagnosis not present

## 2019-02-22 DIAGNOSIS — J302 Other seasonal allergic rhinitis: Secondary | ICD-10-CM | POA: Diagnosis not present

## 2019-02-22 DIAGNOSIS — T84032D Mechanical loosening of internal right knee prosthetic joint, subsequent encounter: Secondary | ICD-10-CM | POA: Diagnosis not present

## 2019-02-22 DIAGNOSIS — Z7901 Long term (current) use of anticoagulants: Secondary | ICD-10-CM | POA: Diagnosis not present

## 2019-02-22 DIAGNOSIS — E559 Vitamin D deficiency, unspecified: Secondary | ICD-10-CM | POA: Diagnosis not present

## 2019-02-22 DIAGNOSIS — M19042 Primary osteoarthritis, left hand: Secondary | ICD-10-CM | POA: Diagnosis not present

## 2019-02-22 DIAGNOSIS — E11319 Type 2 diabetes mellitus with unspecified diabetic retinopathy without macular edema: Secondary | ICD-10-CM | POA: Diagnosis not present

## 2019-02-22 DIAGNOSIS — I10 Essential (primary) hypertension: Secondary | ICD-10-CM | POA: Diagnosis not present

## 2019-02-22 DIAGNOSIS — Z794 Long term (current) use of insulin: Secondary | ICD-10-CM | POA: Diagnosis not present

## 2019-02-22 DIAGNOSIS — K219 Gastro-esophageal reflux disease without esophagitis: Secondary | ICD-10-CM | POA: Diagnosis not present

## 2019-02-22 DIAGNOSIS — N952 Postmenopausal atrophic vaginitis: Secondary | ICD-10-CM | POA: Diagnosis not present

## 2019-02-22 DIAGNOSIS — I2510 Atherosclerotic heart disease of native coronary artery without angina pectoris: Secondary | ICD-10-CM | POA: Diagnosis not present

## 2019-02-22 DIAGNOSIS — Z86718 Personal history of other venous thrombosis and embolism: Secondary | ICD-10-CM | POA: Diagnosis not present

## 2019-02-22 DIAGNOSIS — L930 Discoid lupus erythematosus: Secondary | ICD-10-CM | POA: Diagnosis not present

## 2019-02-23 DIAGNOSIS — H409 Unspecified glaucoma: Secondary | ICD-10-CM | POA: Diagnosis not present

## 2019-02-23 DIAGNOSIS — I10 Essential (primary) hypertension: Secondary | ICD-10-CM | POA: Diagnosis not present

## 2019-02-23 DIAGNOSIS — T84032D Mechanical loosening of internal right knee prosthetic joint, subsequent encounter: Secondary | ICD-10-CM | POA: Diagnosis not present

## 2019-02-23 DIAGNOSIS — E559 Vitamin D deficiency, unspecified: Secondary | ICD-10-CM | POA: Diagnosis not present

## 2019-02-23 DIAGNOSIS — E11319 Type 2 diabetes mellitus with unspecified diabetic retinopathy without macular edema: Secondary | ICD-10-CM | POA: Diagnosis not present

## 2019-02-23 DIAGNOSIS — I2510 Atherosclerotic heart disease of native coronary artery without angina pectoris: Secondary | ICD-10-CM | POA: Diagnosis not present

## 2019-02-23 DIAGNOSIS — L930 Discoid lupus erythematosus: Secondary | ICD-10-CM | POA: Diagnosis not present

## 2019-02-23 DIAGNOSIS — Z86718 Personal history of other venous thrombosis and embolism: Secondary | ICD-10-CM | POA: Diagnosis not present

## 2019-02-23 DIAGNOSIS — Z96652 Presence of left artificial knee joint: Secondary | ICD-10-CM | POA: Diagnosis not present

## 2019-02-23 DIAGNOSIS — Z794 Long term (current) use of insulin: Secondary | ICD-10-CM | POA: Diagnosis not present

## 2019-02-23 DIAGNOSIS — G4730 Sleep apnea, unspecified: Secondary | ICD-10-CM | POA: Diagnosis not present

## 2019-02-23 DIAGNOSIS — I051 Rheumatic mitral insufficiency: Secondary | ICD-10-CM | POA: Diagnosis not present

## 2019-02-23 DIAGNOSIS — K219 Gastro-esophageal reflux disease without esophagitis: Secondary | ICD-10-CM | POA: Diagnosis not present

## 2019-02-23 DIAGNOSIS — Z7901 Long term (current) use of anticoagulants: Secondary | ICD-10-CM | POA: Diagnosis not present

## 2019-02-23 DIAGNOSIS — K5900 Constipation, unspecified: Secondary | ICD-10-CM | POA: Diagnosis not present

## 2019-02-23 DIAGNOSIS — J302 Other seasonal allergic rhinitis: Secondary | ICD-10-CM | POA: Diagnosis not present

## 2019-02-23 DIAGNOSIS — N952 Postmenopausal atrophic vaginitis: Secondary | ICD-10-CM | POA: Diagnosis not present

## 2019-02-23 DIAGNOSIS — M19042 Primary osteoarthritis, left hand: Secondary | ICD-10-CM | POA: Diagnosis not present

## 2019-02-23 DIAGNOSIS — M19041 Primary osteoarthritis, right hand: Secondary | ICD-10-CM | POA: Diagnosis not present

## 2019-02-24 DIAGNOSIS — K219 Gastro-esophageal reflux disease without esophagitis: Secondary | ICD-10-CM | POA: Diagnosis not present

## 2019-02-24 DIAGNOSIS — I051 Rheumatic mitral insufficiency: Secondary | ICD-10-CM | POA: Diagnosis not present

## 2019-02-24 DIAGNOSIS — L930 Discoid lupus erythematosus: Secondary | ICD-10-CM | POA: Diagnosis not present

## 2019-02-24 DIAGNOSIS — E559 Vitamin D deficiency, unspecified: Secondary | ICD-10-CM | POA: Diagnosis not present

## 2019-02-24 DIAGNOSIS — I10 Essential (primary) hypertension: Secondary | ICD-10-CM | POA: Diagnosis not present

## 2019-02-24 DIAGNOSIS — I2510 Atherosclerotic heart disease of native coronary artery without angina pectoris: Secondary | ICD-10-CM | POA: Diagnosis not present

## 2019-02-24 DIAGNOSIS — Z96652 Presence of left artificial knee joint: Secondary | ICD-10-CM | POA: Diagnosis not present

## 2019-02-24 DIAGNOSIS — Z794 Long term (current) use of insulin: Secondary | ICD-10-CM | POA: Diagnosis not present

## 2019-02-24 DIAGNOSIS — M19042 Primary osteoarthritis, left hand: Secondary | ICD-10-CM | POA: Diagnosis not present

## 2019-02-24 DIAGNOSIS — E11319 Type 2 diabetes mellitus with unspecified diabetic retinopathy without macular edema: Secondary | ICD-10-CM | POA: Diagnosis not present

## 2019-02-24 DIAGNOSIS — M19041 Primary osteoarthritis, right hand: Secondary | ICD-10-CM | POA: Diagnosis not present

## 2019-02-24 DIAGNOSIS — Z7901 Long term (current) use of anticoagulants: Secondary | ICD-10-CM | POA: Diagnosis not present

## 2019-02-24 DIAGNOSIS — G4730 Sleep apnea, unspecified: Secondary | ICD-10-CM | POA: Diagnosis not present

## 2019-02-24 DIAGNOSIS — H409 Unspecified glaucoma: Secondary | ICD-10-CM | POA: Diagnosis not present

## 2019-02-24 DIAGNOSIS — J302 Other seasonal allergic rhinitis: Secondary | ICD-10-CM | POA: Diagnosis not present

## 2019-02-24 DIAGNOSIS — Z86718 Personal history of other venous thrombosis and embolism: Secondary | ICD-10-CM | POA: Diagnosis not present

## 2019-02-24 DIAGNOSIS — K5900 Constipation, unspecified: Secondary | ICD-10-CM | POA: Diagnosis not present

## 2019-02-24 DIAGNOSIS — N952 Postmenopausal atrophic vaginitis: Secondary | ICD-10-CM | POA: Diagnosis not present

## 2019-02-24 DIAGNOSIS — T84032D Mechanical loosening of internal right knee prosthetic joint, subsequent encounter: Secondary | ICD-10-CM | POA: Diagnosis not present

## 2019-02-25 DIAGNOSIS — I82409 Acute embolism and thrombosis of unspecified deep veins of unspecified lower extremity: Secondary | ICD-10-CM | POA: Diagnosis not present

## 2019-02-25 DIAGNOSIS — R799 Abnormal finding of blood chemistry, unspecified: Secondary | ICD-10-CM | POA: Diagnosis not present

## 2019-02-25 DIAGNOSIS — E039 Hypothyroidism, unspecified: Secondary | ICD-10-CM | POA: Diagnosis not present

## 2019-02-27 ENCOUNTER — Other Ambulatory Visit: Payer: Self-pay | Admitting: Orthopaedic Surgery

## 2019-02-27 ENCOUNTER — Telehealth: Payer: Self-pay | Admitting: Orthopaedic Surgery

## 2019-02-27 DIAGNOSIS — Z86718 Personal history of other venous thrombosis and embolism: Secondary | ICD-10-CM | POA: Diagnosis not present

## 2019-02-27 DIAGNOSIS — E11319 Type 2 diabetes mellitus with unspecified diabetic retinopathy without macular edema: Secondary | ICD-10-CM | POA: Diagnosis not present

## 2019-02-27 DIAGNOSIS — H409 Unspecified glaucoma: Secondary | ICD-10-CM | POA: Diagnosis not present

## 2019-02-27 DIAGNOSIS — L930 Discoid lupus erythematosus: Secondary | ICD-10-CM | POA: Diagnosis not present

## 2019-02-27 DIAGNOSIS — K219 Gastro-esophageal reflux disease without esophagitis: Secondary | ICD-10-CM | POA: Diagnosis not present

## 2019-02-27 DIAGNOSIS — M19042 Primary osteoarthritis, left hand: Secondary | ICD-10-CM | POA: Diagnosis not present

## 2019-02-27 DIAGNOSIS — N952 Postmenopausal atrophic vaginitis: Secondary | ICD-10-CM | POA: Diagnosis not present

## 2019-02-27 DIAGNOSIS — Z794 Long term (current) use of insulin: Secondary | ICD-10-CM | POA: Diagnosis not present

## 2019-02-27 DIAGNOSIS — K5900 Constipation, unspecified: Secondary | ICD-10-CM | POA: Diagnosis not present

## 2019-02-27 DIAGNOSIS — I10 Essential (primary) hypertension: Secondary | ICD-10-CM | POA: Diagnosis not present

## 2019-02-27 DIAGNOSIS — I051 Rheumatic mitral insufficiency: Secondary | ICD-10-CM | POA: Diagnosis not present

## 2019-02-27 DIAGNOSIS — Z96652 Presence of left artificial knee joint: Secondary | ICD-10-CM | POA: Diagnosis not present

## 2019-02-27 DIAGNOSIS — T84032D Mechanical loosening of internal right knee prosthetic joint, subsequent encounter: Secondary | ICD-10-CM | POA: Diagnosis not present

## 2019-02-27 DIAGNOSIS — M19041 Primary osteoarthritis, right hand: Secondary | ICD-10-CM | POA: Diagnosis not present

## 2019-02-27 DIAGNOSIS — G4730 Sleep apnea, unspecified: Secondary | ICD-10-CM | POA: Diagnosis not present

## 2019-02-27 DIAGNOSIS — I2510 Atherosclerotic heart disease of native coronary artery without angina pectoris: Secondary | ICD-10-CM | POA: Diagnosis not present

## 2019-02-27 DIAGNOSIS — J302 Other seasonal allergic rhinitis: Secondary | ICD-10-CM | POA: Diagnosis not present

## 2019-02-27 DIAGNOSIS — Z7901 Long term (current) use of anticoagulants: Secondary | ICD-10-CM | POA: Diagnosis not present

## 2019-02-27 DIAGNOSIS — E559 Vitamin D deficiency, unspecified: Secondary | ICD-10-CM | POA: Diagnosis not present

## 2019-02-27 MED ORDER — OXYCODONE HCL 5 MG PO CAPS: 5.0000 mg | ORAL_CAPSULE | Freq: Four times a day (QID) | ORAL | 0 refills | Status: DC | PRN

## 2019-02-27 NOTE — Telephone Encounter (Signed)
sent 

## 2019-02-27 NOTE — Telephone Encounter (Signed)
Patient called requesting refill of Oxycodone. Patient she has appointment tomorrow but don't have medication not to last her threw the night for pain. states to [facility] [address]. Patient phone number is [phone].

## 2019-02-27 NOTE — Telephone Encounter (Signed)
Please advise 

## 2019-02-27 NOTE — Telephone Encounter (Signed)
Patient has been notified

## 2019-02-28 ENCOUNTER — Ambulatory Visit (INDEPENDENT_AMBULATORY_CARE_PROVIDER_SITE_OTHER): Payer: Medicare Other | Admitting: Orthopaedic Surgery

## 2019-02-28 ENCOUNTER — Encounter: Payer: Self-pay | Admitting: Orthopaedic Surgery

## 2019-02-28 ENCOUNTER — Other Ambulatory Visit: Payer: Self-pay

## 2019-02-28 VITALS — Ht 64.0 in | Wt 239.0 lb

## 2019-02-28 DIAGNOSIS — Z96651 Presence of right artificial knee joint: Secondary | ICD-10-CM

## 2019-02-28 DIAGNOSIS — T84032D Mechanical loosening of internal right knee prosthetic joint, subsequent encounter: Secondary | ICD-10-CM

## 2019-02-28 NOTE — Progress Notes (Signed)
Office Visit Note   [name]           Date of Birth: 06-May-1945           MRN: [id number] Visit Date: 02/28/2019              Requested by: Lucianne Lei, [address] [place],  [place] [address] PCP: Lucianne Lei, MD   Assessment & Plan: Visit Diagnoses:  1. 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 S/P revision of total knee, right     Plan: 4 weeks status post revision of patella polyethylene component of her total knee replacement and the tibial component.  Doing well.  Pain is "better".  No fever or chills.  Still using a walker but progressing to a cane.  No obvious problems based on her exam today.  May bear weight to tolerance.  Will start outpatient therapy after Christmas.  Office 1 month Follow-Up Instructions: Return in about 1 month (around 03/31/2019).   Orders:  Orders Placed This Encounter  Procedures  . Ambulatory referral to Physical Therapy   No orders of the defined types were placed in this encounter.     Procedures: No procedures performed   Clinical Data: No additional findings.   Subjective: Chief Complaint  Patient presents with  . Right Knee - Routine Post Op    Right total knee arthroplasty revision DOS 01/31/2019  Patient presents today for a follow up on her right knee. She had revision of her total knee arthroplasty on 01/31/2019. She is now four weeks out from surgery. Patient states that she is doing better today, but has been having a lot of pain. She said that it hurts more with the cold weather. She is taking oxycodone for pain.  Oxycodone renewed yesterday.  Long discussion regarding stretching out the interval to 6 to 8 hours rather than before.  No related fever or chills.  No calf pain.  No distal edema.  Pain is definitely different and better from preoperative status  HPI  Review of Systems  Constitutional: Negative for fatigue.  HENT: Negative for ear pain.     Eyes: Negative for pain.  Respiratory: Negative for shortness of breath.   Cardiovascular: Negative for leg swelling.  Gastrointestinal: Positive for constipation. Negative for diarrhea.  Endocrine: Negative for cold intolerance and heat intolerance.  Genitourinary: Negative for difficulty urinating.  Musculoskeletal: Negative for joint swelling.  Skin: Negative for rash.  Allergic/Immunologic: Negative for food allergies.  Neurological: Negative for weakness.  Hematological: Does not bruise/bleed easily.  Psychiatric/Behavioral: Negative for sleep disturbance.     Objective: Vital Signs: Ht 5\' 4"  (1.626 m)   Wt 239 lb (108.4 kg)   BMI 41.02 kg/m   Physical Exam  Ortho Exam awake alert and oriented x3.  Comfortable sitting.  Right knee looks fine incision is healed nicely and there is very little swelling and no effusion.  No calf pain no distal edema.  Motor exam intact.  Full extension and flexed over 90 degrees.  Opens a little bit medially with a valgus stress but with a definite endpoint.  No opening laterally with the varus stress.  Has considerably weak quads which I suspect is going to be chronic problem in her rehab.  Specialty Comments:  No specialty comments available.  Imaging: No results found.   PMFS History: Patient Active Problem List   Diagnosis Date Noted  . Pain in right  knee 02/14/2019  . S/P revision of total knee, right 01/31/2019  . 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 Sleep apnea 01/31/2018  . Atrophic vaginitis 01/31/2018  . Hyponatremia 01/31/2018  . Increased body mass index 01/31/2018  . Osteoarthritis 01/31/2018  . Postmenopausal bleeding 01/31/2018  . Systemic lupus erythematosus ([place]) 01/31/2018  . Uterine leiomyoma 01/31/2018  . Trigger middle finger of left hand 01/11/2017  . 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 High risk medication use 01/06/2016  . S/P TKR (total knee replacement),  bilateral 01/06/2016  . 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 Vitamin D deficiency 01/06/2016  . Deep venous thrombosis of lower extremity ([place]) 06/04/2015  . Osteopenia 03/14/2012  . Diabetes mellitus type 2 in obese ([place]) 03/14/2012  . 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 Hypertension 03/14/2012   Past Medical History:  Diagnosis Date  . Clotting disorder ([place])   . Cough 02/08/2017  . Diabetic retinopathy ([place])   . 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 Full dentures   . GERD (gastroesophageal reflux disease)   . Heart murmur   . History of blood clots 1996   groin  . History of glaucoma    had laser correction  . Hypertension    states under control with meds., has been on med. since 1996  . Insulin dependent diabetes mellitus   . Mitral valve prolapse   . Osteoarthritis    bilateral knee  . Seasonal allergies   . Sleep apnea    no CPAP use in several months, per pt.  . Trigger finger, left middle finger 01/2017    Family History  Problem Relation Age of Onset  . Cancer Mother        colon  . Cancer Cousin        lung    Past Surgical History:  Procedure Laterality Date  . BACK SURGERY     lower - removed  cysts  . CATARACT EXTRACTION W/ INTRAOCULAR LENS  IMPLANT, BILATERAL Bilateral   . GLAUCOMA SURGERY Bilateral    laser  . HEMILAMINOTOMY LUMBAR SPINE Right 01/30/2009   L5  . JOINT REPLACEMENT    . TOTAL KNEE ARTHROPLASTY Right 09/22/2007  . TOTAL KNEE ARTHROPLASTY Left 02/01/2007  . TOTAL KNEE REVISION Right 01/31/2019   Procedure: RIGHT TOTAL KNEE REVISION;  Surgeon: [name], MD;  [facility];  Service: Orthopedics;  Laterality: Right;  . TRIGGER FINGER RELEASE Right 12/14/2013   Procedure: RELEASE TRIGGER FINGER/A-1 PULLEY RIGHT RING FINGER;  Surgeon: Daryll Brod, MD;  Location: [facility];  Service: Orthopedics;  Laterality: Right;  . TRIGGER FINGER RELEASE Left 02/11/2017    Procedure: RELEASE TRIGGER FINGER/A-1 PULLEY;  Surgeon: Leanora Cover, MD;  Location: [facility];  Service: Orthopedics;  Laterality: Left;   Social History   Occupational History  . Not on file  Tobacco Use  . Smoking status: Never Smoker  . Smokeless tobacco: Never Used  Substance and Sexual Activity  . Alcohol use: No  . Drug use: No  . Sexual activity: Not on file

## 2019-03-01 DIAGNOSIS — N952 Postmenopausal atrophic vaginitis: Secondary | ICD-10-CM | POA: Diagnosis not present

## 2019-03-01 DIAGNOSIS — M19042 Primary osteoarthritis, left hand: Secondary | ICD-10-CM | POA: Diagnosis not present

## 2019-03-01 DIAGNOSIS — M19041 Primary osteoarthritis, right hand: Secondary | ICD-10-CM | POA: Diagnosis not present

## 2019-03-01 DIAGNOSIS — Z794 Long term (current) use of insulin: Secondary | ICD-10-CM | POA: Diagnosis not present

## 2019-03-01 DIAGNOSIS — I051 Rheumatic mitral insufficiency: Secondary | ICD-10-CM | POA: Diagnosis not present

## 2019-03-01 DIAGNOSIS — I2510 Atherosclerotic heart disease of native coronary artery without angina pectoris: Secondary | ICD-10-CM | POA: Diagnosis not present

## 2019-03-01 DIAGNOSIS — I10 Essential (primary) hypertension: Secondary | ICD-10-CM | POA: Diagnosis not present

## 2019-03-01 DIAGNOSIS — L930 Discoid lupus erythematosus: Secondary | ICD-10-CM | POA: Diagnosis not present

## 2019-03-01 DIAGNOSIS — H409 Unspecified glaucoma: Secondary | ICD-10-CM | POA: Diagnosis not present

## 2019-03-01 DIAGNOSIS — E11319 Type 2 diabetes mellitus with unspecified diabetic retinopathy without macular edema: Secondary | ICD-10-CM | POA: Diagnosis not present

## 2019-03-01 DIAGNOSIS — K219 Gastro-esophageal reflux disease without esophagitis: Secondary | ICD-10-CM | POA: Diagnosis not present

## 2019-03-01 DIAGNOSIS — K5900 Constipation, unspecified: Secondary | ICD-10-CM | POA: Diagnosis not present

## 2019-03-01 DIAGNOSIS — Z86718 Personal history of other venous thrombosis and embolism: Secondary | ICD-10-CM | POA: Diagnosis not present

## 2019-03-01 DIAGNOSIS — G4730 Sleep apnea, unspecified: Secondary | ICD-10-CM | POA: Diagnosis not present

## 2019-03-01 DIAGNOSIS — Z7901 Long term (current) use of anticoagulants: Secondary | ICD-10-CM | POA: Diagnosis not present

## 2019-03-01 DIAGNOSIS — Z96652 Presence of left artificial knee joint: Secondary | ICD-10-CM | POA: Diagnosis not present

## 2019-03-01 DIAGNOSIS — T84032D Mechanical loosening of internal right knee prosthetic joint, subsequent encounter: Secondary | ICD-10-CM | POA: Diagnosis not present

## 2019-03-01 DIAGNOSIS — J302 Other seasonal allergic rhinitis: Secondary | ICD-10-CM | POA: Diagnosis not present

## 2019-03-01 DIAGNOSIS — E559 Vitamin D deficiency, unspecified: Secondary | ICD-10-CM | POA: Diagnosis not present

## 2019-03-02 DIAGNOSIS — Z794 Long term (current) use of insulin: Secondary | ICD-10-CM | POA: Diagnosis not present

## 2019-03-02 DIAGNOSIS — M19041 Primary osteoarthritis, right hand: Secondary | ICD-10-CM | POA: Diagnosis not present

## 2019-03-02 DIAGNOSIS — K219 Gastro-esophageal reflux disease without esophagitis: Secondary | ICD-10-CM | POA: Diagnosis not present

## 2019-03-02 DIAGNOSIS — E559 Vitamin D deficiency, unspecified: Secondary | ICD-10-CM | POA: Diagnosis not present

## 2019-03-02 DIAGNOSIS — I10 Essential (primary) hypertension: Secondary | ICD-10-CM | POA: Diagnosis not present

## 2019-03-02 DIAGNOSIS — J302 Other seasonal allergic rhinitis: Secondary | ICD-10-CM | POA: Diagnosis not present

## 2019-03-02 DIAGNOSIS — E11319 Type 2 diabetes mellitus with unspecified diabetic retinopathy without macular edema: Secondary | ICD-10-CM | POA: Diagnosis not present

## 2019-03-02 DIAGNOSIS — T84032D Mechanical loosening of internal right knee prosthetic joint, subsequent encounter: Secondary | ICD-10-CM | POA: Diagnosis not present

## 2019-03-02 DIAGNOSIS — Z96652 Presence of left artificial knee joint: Secondary | ICD-10-CM | POA: Diagnosis not present

## 2019-03-02 DIAGNOSIS — Z7901 Long term (current) use of anticoagulants: Secondary | ICD-10-CM | POA: Diagnosis not present

## 2019-03-02 DIAGNOSIS — H409 Unspecified glaucoma: Secondary | ICD-10-CM | POA: Diagnosis not present

## 2019-03-02 DIAGNOSIS — I2510 Atherosclerotic heart disease of native coronary artery without angina pectoris: Secondary | ICD-10-CM | POA: Diagnosis not present

## 2019-03-02 DIAGNOSIS — K5900 Constipation, unspecified: Secondary | ICD-10-CM | POA: Diagnosis not present

## 2019-03-02 DIAGNOSIS — I051 Rheumatic mitral insufficiency: Secondary | ICD-10-CM | POA: Diagnosis not present

## 2019-03-02 DIAGNOSIS — N952 Postmenopausal atrophic vaginitis: Secondary | ICD-10-CM | POA: Diagnosis not present

## 2019-03-02 DIAGNOSIS — M19042 Primary osteoarthritis, left hand: Secondary | ICD-10-CM | POA: Diagnosis not present

## 2019-03-02 DIAGNOSIS — Z86718 Personal history of other venous thrombosis and embolism: Secondary | ICD-10-CM | POA: Diagnosis not present

## 2019-03-02 DIAGNOSIS — L930 Discoid lupus erythematosus: Secondary | ICD-10-CM | POA: Diagnosis not present

## 2019-03-02 DIAGNOSIS — G4730 Sleep apnea, unspecified: Secondary | ICD-10-CM | POA: Diagnosis not present

## 2019-03-03 DIAGNOSIS — N952 Postmenopausal atrophic vaginitis: Secondary | ICD-10-CM | POA: Diagnosis not present

## 2019-03-03 DIAGNOSIS — K5900 Constipation, unspecified: Secondary | ICD-10-CM | POA: Diagnosis not present

## 2019-03-03 DIAGNOSIS — K219 Gastro-esophageal reflux disease without esophagitis: Secondary | ICD-10-CM | POA: Diagnosis not present

## 2019-03-03 DIAGNOSIS — E1142 Type 2 diabetes mellitus with diabetic polyneuropathy: Secondary | ICD-10-CM | POA: Diagnosis not present

## 2019-03-03 DIAGNOSIS — I82409 Acute embolism and thrombosis of unspecified deep veins of unspecified lower extremity: Secondary | ICD-10-CM | POA: Diagnosis not present

## 2019-03-03 DIAGNOSIS — M19042 Primary osteoarthritis, left hand: Secondary | ICD-10-CM | POA: Diagnosis not present

## 2019-03-03 DIAGNOSIS — J302 Other seasonal allergic rhinitis: Secondary | ICD-10-CM | POA: Diagnosis not present

## 2019-03-03 DIAGNOSIS — E11319 Type 2 diabetes mellitus with unspecified diabetic retinopathy without macular edema: Secondary | ICD-10-CM | POA: Diagnosis not present

## 2019-03-03 DIAGNOSIS — E559 Vitamin D deficiency, unspecified: Secondary | ICD-10-CM | POA: Diagnosis not present

## 2019-03-03 DIAGNOSIS — I10 Essential (primary) hypertension: Secondary | ICD-10-CM | POA: Diagnosis not present

## 2019-03-03 DIAGNOSIS — I2510 Atherosclerotic heart disease of native coronary artery without angina pectoris: Secondary | ICD-10-CM | POA: Diagnosis not present

## 2019-03-03 DIAGNOSIS — L930 Discoid lupus erythematosus: Secondary | ICD-10-CM | POA: Diagnosis not present

## 2019-03-03 DIAGNOSIS — T84032D Mechanical loosening of internal right knee prosthetic joint, subsequent encounter: Secondary | ICD-10-CM | POA: Diagnosis not present

## 2019-03-03 DIAGNOSIS — Z7901 Long term (current) use of anticoagulants: Secondary | ICD-10-CM | POA: Diagnosis not present

## 2019-03-03 DIAGNOSIS — I051 Rheumatic mitral insufficiency: Secondary | ICD-10-CM | POA: Diagnosis not present

## 2019-03-03 DIAGNOSIS — H409 Unspecified glaucoma: Secondary | ICD-10-CM | POA: Diagnosis not present

## 2019-03-03 DIAGNOSIS — Z86718 Personal history of other venous thrombosis and embolism: Secondary | ICD-10-CM | POA: Diagnosis not present

## 2019-03-03 DIAGNOSIS — M19041 Primary osteoarthritis, right hand: Secondary | ICD-10-CM | POA: Diagnosis not present

## 2019-03-03 DIAGNOSIS — E1169 Type 2 diabetes mellitus with other specified complication: Secondary | ICD-10-CM | POA: Diagnosis not present

## 2019-03-03 DIAGNOSIS — Z794 Long term (current) use of insulin: Secondary | ICD-10-CM | POA: Diagnosis not present

## 2019-03-03 DIAGNOSIS — Z96652 Presence of left artificial knee joint: Secondary | ICD-10-CM | POA: Diagnosis not present

## 2019-03-03 DIAGNOSIS — G4730 Sleep apnea, unspecified: Secondary | ICD-10-CM | POA: Diagnosis not present

## 2019-03-08 DIAGNOSIS — J302 Other seasonal allergic rhinitis: Secondary | ICD-10-CM | POA: Diagnosis not present

## 2019-03-08 DIAGNOSIS — Z96652 Presence of left artificial knee joint: Secondary | ICD-10-CM | POA: Diagnosis not present

## 2019-03-08 DIAGNOSIS — Z7901 Long term (current) use of anticoagulants: Secondary | ICD-10-CM | POA: Diagnosis not present

## 2019-03-08 DIAGNOSIS — K219 Gastro-esophageal reflux disease without esophagitis: Secondary | ICD-10-CM | POA: Diagnosis not present

## 2019-03-08 DIAGNOSIS — M19042 Primary osteoarthritis, left hand: Secondary | ICD-10-CM | POA: Diagnosis not present

## 2019-03-08 DIAGNOSIS — T84032D Mechanical loosening of internal right knee prosthetic joint, subsequent encounter: Secondary | ICD-10-CM | POA: Diagnosis not present

## 2019-03-08 DIAGNOSIS — E559 Vitamin D deficiency, unspecified: Secondary | ICD-10-CM | POA: Diagnosis not present

## 2019-03-08 DIAGNOSIS — L930 Discoid lupus erythematosus: Secondary | ICD-10-CM | POA: Diagnosis not present

## 2019-03-08 DIAGNOSIS — E11319 Type 2 diabetes mellitus with unspecified diabetic retinopathy without macular edema: Secondary | ICD-10-CM | POA: Diagnosis not present

## 2019-03-08 DIAGNOSIS — G4730 Sleep apnea, unspecified: Secondary | ICD-10-CM | POA: Diagnosis not present

## 2019-03-08 DIAGNOSIS — Z86718 Personal history of other venous thrombosis and embolism: Secondary | ICD-10-CM | POA: Diagnosis not present

## 2019-03-08 DIAGNOSIS — I2510 Atherosclerotic heart disease of native coronary artery without angina pectoris: Secondary | ICD-10-CM | POA: Diagnosis not present

## 2019-03-08 DIAGNOSIS — H409 Unspecified glaucoma: Secondary | ICD-10-CM | POA: Diagnosis not present

## 2019-03-08 DIAGNOSIS — K5900 Constipation, unspecified: Secondary | ICD-10-CM | POA: Diagnosis not present

## 2019-03-08 DIAGNOSIS — N952 Postmenopausal atrophic vaginitis: Secondary | ICD-10-CM | POA: Diagnosis not present

## 2019-03-08 DIAGNOSIS — Z794 Long term (current) use of insulin: Secondary | ICD-10-CM | POA: Diagnosis not present

## 2019-03-08 DIAGNOSIS — I10 Essential (primary) hypertension: Secondary | ICD-10-CM | POA: Diagnosis not present

## 2019-03-08 DIAGNOSIS — I051 Rheumatic mitral insufficiency: Secondary | ICD-10-CM | POA: Diagnosis not present

## 2019-03-08 DIAGNOSIS — M19041 Primary osteoarthritis, right hand: Secondary | ICD-10-CM | POA: Diagnosis not present

## 2019-03-13 DIAGNOSIS — M19041 Primary osteoarthritis, right hand: Secondary | ICD-10-CM | POA: Diagnosis not present

## 2019-03-13 DIAGNOSIS — M19042 Primary osteoarthritis, left hand: Secondary | ICD-10-CM | POA: Diagnosis not present

## 2019-03-13 DIAGNOSIS — Z86718 Personal history of other venous thrombosis and embolism: Secondary | ICD-10-CM | POA: Diagnosis not present

## 2019-03-13 DIAGNOSIS — K5900 Constipation, unspecified: Secondary | ICD-10-CM | POA: Diagnosis not present

## 2019-03-13 DIAGNOSIS — K219 Gastro-esophageal reflux disease without esophagitis: Secondary | ICD-10-CM | POA: Diagnosis not present

## 2019-03-13 DIAGNOSIS — J302 Other seasonal allergic rhinitis: Secondary | ICD-10-CM | POA: Diagnosis not present

## 2019-03-13 DIAGNOSIS — L930 Discoid lupus erythematosus: Secondary | ICD-10-CM | POA: Diagnosis not present

## 2019-03-13 DIAGNOSIS — N952 Postmenopausal atrophic vaginitis: Secondary | ICD-10-CM | POA: Diagnosis not present

## 2019-03-13 DIAGNOSIS — G4730 Sleep apnea, unspecified: Secondary | ICD-10-CM | POA: Diagnosis not present

## 2019-03-13 DIAGNOSIS — Z7901 Long term (current) use of anticoagulants: Secondary | ICD-10-CM | POA: Diagnosis not present

## 2019-03-13 DIAGNOSIS — I10 Essential (primary) hypertension: Secondary | ICD-10-CM | POA: Diagnosis not present

## 2019-03-13 DIAGNOSIS — I2510 Atherosclerotic heart disease of native coronary artery without angina pectoris: Secondary | ICD-10-CM | POA: Diagnosis not present

## 2019-03-13 DIAGNOSIS — T84032D Mechanical loosening of internal right knee prosthetic joint, subsequent encounter: Secondary | ICD-10-CM | POA: Diagnosis not present

## 2019-03-13 DIAGNOSIS — Z794 Long term (current) use of insulin: Secondary | ICD-10-CM | POA: Diagnosis not present

## 2019-03-13 DIAGNOSIS — Z96652 Presence of left artificial knee joint: Secondary | ICD-10-CM | POA: Diagnosis not present

## 2019-03-13 DIAGNOSIS — I051 Rheumatic mitral insufficiency: Secondary | ICD-10-CM | POA: Diagnosis not present

## 2019-03-13 DIAGNOSIS — H409 Unspecified glaucoma: Secondary | ICD-10-CM | POA: Diagnosis not present

## 2019-03-13 DIAGNOSIS — E559 Vitamin D deficiency, unspecified: Secondary | ICD-10-CM | POA: Diagnosis not present

## 2019-03-13 DIAGNOSIS — E11319 Type 2 diabetes mellitus with unspecified diabetic retinopathy without macular edema: Secondary | ICD-10-CM | POA: Diagnosis not present

## 2019-03-16 ENCOUNTER — Other Ambulatory Visit: Payer: Self-pay | Admitting: Orthopaedic Surgery

## 2019-03-16 ENCOUNTER — Telehealth: Payer: Self-pay | Admitting: Orthopaedic Surgery

## 2019-03-16 MED ORDER — OXYCODONE HCL 5 MG PO CAPS: 5.0000 mg | ORAL_CAPSULE | Freq: Three times a day (TID) | ORAL | 0 refills | Status: DC | PRN

## 2019-03-16 NOTE — Telephone Encounter (Signed)
Please advise 

## 2019-03-16 NOTE — Telephone Encounter (Signed)
Pt called in requesting a refill on Oxycodone, [facility] [address]   [phone]

## 2019-03-16 NOTE — Telephone Encounter (Signed)
sent 

## 2019-03-16 NOTE — Telephone Encounter (Signed)
Spoke with patient and notified her that script has been sent to pharmacy.

## 2019-03-20 ENCOUNTER — Encounter (INDEPENDENT_AMBULATORY_CARE_PROVIDER_SITE_OTHER): Payer: Medicare Other | Admitting: Ophthalmology

## 2019-03-20 DIAGNOSIS — E113311 Type 2 diabetes mellitus with moderate nonproliferative diabetic retinopathy with macular edema, right eye: Secondary | ICD-10-CM

## 2019-03-20 DIAGNOSIS — E11311 Type 2 diabetes mellitus with unspecified diabetic retinopathy with macular edema: Secondary | ICD-10-CM | POA: Diagnosis not present

## 2019-03-20 DIAGNOSIS — E113292 Type 2 diabetes mellitus with mild nonproliferative diabetic retinopathy without macular edema, left eye: Secondary | ICD-10-CM | POA: Diagnosis not present

## 2019-03-20 DIAGNOSIS — H33302 Unspecified retinal break, left eye: Secondary | ICD-10-CM

## 2019-03-20 DIAGNOSIS — H43813 Vitreous degeneration, bilateral: Secondary | ICD-10-CM

## 2019-03-20 DIAGNOSIS — I10 Essential (primary) hypertension: Secondary | ICD-10-CM

## 2019-03-20 DIAGNOSIS — H35033 Hypertensive retinopathy, bilateral: Secondary | ICD-10-CM | POA: Diagnosis not present

## 2019-03-22 DIAGNOSIS — J302 Other seasonal allergic rhinitis: Secondary | ICD-10-CM | POA: Diagnosis not present

## 2019-03-22 DIAGNOSIS — G4730 Sleep apnea, unspecified: Secondary | ICD-10-CM | POA: Diagnosis not present

## 2019-03-22 DIAGNOSIS — L930 Discoid lupus erythematosus: Secondary | ICD-10-CM | POA: Diagnosis not present

## 2019-03-22 DIAGNOSIS — I2510 Atherosclerotic heart disease of native coronary artery without angina pectoris: Secondary | ICD-10-CM | POA: Diagnosis not present

## 2019-03-22 DIAGNOSIS — Z96652 Presence of left artificial knee joint: Secondary | ICD-10-CM | POA: Diagnosis not present

## 2019-03-22 DIAGNOSIS — I051 Rheumatic mitral insufficiency: Secondary | ICD-10-CM | POA: Diagnosis not present

## 2019-03-22 DIAGNOSIS — Z86718 Personal history of other venous thrombosis and embolism: Secondary | ICD-10-CM | POA: Diagnosis not present

## 2019-03-22 DIAGNOSIS — K5900 Constipation, unspecified: Secondary | ICD-10-CM | POA: Diagnosis not present

## 2019-03-22 DIAGNOSIS — I10 Essential (primary) hypertension: Secondary | ICD-10-CM | POA: Diagnosis not present

## 2019-03-22 DIAGNOSIS — Z794 Long term (current) use of insulin: Secondary | ICD-10-CM | POA: Diagnosis not present

## 2019-03-22 DIAGNOSIS — H409 Unspecified glaucoma: Secondary | ICD-10-CM | POA: Diagnosis not present

## 2019-03-22 DIAGNOSIS — E559 Vitamin D deficiency, unspecified: Secondary | ICD-10-CM | POA: Diagnosis not present

## 2019-03-22 DIAGNOSIS — Z7901 Long term (current) use of anticoagulants: Secondary | ICD-10-CM | POA: Diagnosis not present

## 2019-03-22 DIAGNOSIS — T84032D Mechanical loosening of internal right knee prosthetic joint, subsequent encounter: Secondary | ICD-10-CM | POA: Diagnosis not present

## 2019-03-22 DIAGNOSIS — N952 Postmenopausal atrophic vaginitis: Secondary | ICD-10-CM | POA: Diagnosis not present

## 2019-03-22 DIAGNOSIS — K219 Gastro-esophageal reflux disease without esophagitis: Secondary | ICD-10-CM | POA: Diagnosis not present

## 2019-03-22 DIAGNOSIS — E11319 Type 2 diabetes mellitus with unspecified diabetic retinopathy without macular edema: Secondary | ICD-10-CM | POA: Diagnosis not present

## 2019-03-22 DIAGNOSIS — M19041 Primary osteoarthritis, right hand: Secondary | ICD-10-CM | POA: Diagnosis not present

## 2019-03-22 DIAGNOSIS — M19042 Primary osteoarthritis, left hand: Secondary | ICD-10-CM | POA: Diagnosis not present

## 2019-03-23 ENCOUNTER — Encounter: Payer: Self-pay | Admitting: Physical Therapy

## 2019-03-23 ENCOUNTER — Other Ambulatory Visit: Payer: Self-pay

## 2019-03-23 ENCOUNTER — Ambulatory Visit: Payer: Medicare Other | Attending: Orthopaedic Surgery | Admitting: Physical Therapy

## 2019-03-23 DIAGNOSIS — Z96651 Presence of right artificial knee joint: Secondary | ICD-10-CM | POA: Insufficient documentation

## 2019-03-23 DIAGNOSIS — M25661 Stiffness of right knee, not elsewhere classified: Secondary | ICD-10-CM | POA: Insufficient documentation

## 2019-03-23 DIAGNOSIS — R262 Difficulty in walking, not elsewhere classified: Secondary | ICD-10-CM | POA: Diagnosis not present

## 2019-03-23 DIAGNOSIS — M6281 Muscle weakness (generalized): Secondary | ICD-10-CM | POA: Diagnosis not present

## 2019-03-24 NOTE — Therapy (Signed)
[place], [place], [address] Phone: [phone]   Fax:  [phone]  Physical Therapy Evaluation  Patient Details  Name: Kaitlyn Jackson MRN: [id number] Date of Birth: 1945/11/04 Referring Provider (PT): Dr.Peter Durward Fortes   Encounter Date: 03/23/2019  PT End of Session - 03/24/19 0947    Visit Number  1    Number of Visits  12    Date for PT Re-Evaluation  05/04/19    Authorization Type  [employer] MCR MCD    PT Start Time  1615    PT Stop Time  1702    PT Time Calculation (min)  47 min    Activity Tolerance  Patient tolerated treatment well    Behavior During Therapy  [facility] for tasks assessed/performed       Past Medical History:  Diagnosis Date  . Clotting disorder ([place])   . Cough 02/08/2017  . Diabetic retinopathy ([place])   . 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 Full dentures   . GERD (gastroesophageal reflux disease)   . Heart murmur   . History of blood clots 1996   groin  . History of glaucoma    had laser correction  . Hypertension    states under control with meds., has been on med. since 1996  . Insulin dependent diabetes mellitus   . Mitral valve prolapse   . Osteoarthritis    bilateral knee  . Seasonal allergies   . Sleep apnea    no CPAP use in several months, per pt.  . Trigger finger, left middle finger 01/2017    Past Surgical History:  Procedure Laterality Date  . BACK SURGERY     lower - removed  cysts  . CATARACT EXTRACTION W/ INTRAOCULAR LENS  IMPLANT, BILATERAL Bilateral   . GLAUCOMA SURGERY Bilateral    laser  . HEMILAMINOTOMY LUMBAR SPINE Right 01/30/2009   L5  . JOINT REPLACEMENT    . TOTAL KNEE ARTHROPLASTY Right 09/22/2007  . TOTAL KNEE ARTHROPLASTY Left 02/01/2007  . TOTAL KNEE REVISION Right 01/31/2019   Procedure: RIGHT TOTAL KNEE REVISION;  Surgeon: [name], MD;  [facility];  Service: Orthopedics;  Laterality: Right;  .  TRIGGER FINGER RELEASE Right 12/14/2013   Procedure: RELEASE TRIGGER FINGER/A-1 PULLEY RIGHT RING FINGER;  Surgeon: Daryll Brod, MD;  Location: [facility];  Service: Orthopedics;  Laterality: Right;  . TRIGGER FINGER RELEASE Left 02/11/2017   Procedure: RELEASE TRIGGER FINGER/A-1 PULLEY;  Surgeon: Leanora Cover, MD;  Location: [facility];  Service: Orthopedics;  Laterality: Left;    There were no vitals filed for this visit.   Subjective Assessment - 03/23/19 1626    Subjective  Pt had a revision done 02/28/19 on Rt knee. She uses a walker and a cane sometimes and has been trying to be more independent.  She had a CPM and her PT was 3 x per week. She has been doing a bit of cooking anf light housework. The hardest thing for her to do is get comfortable at night.    Pertinent History  2008, 2009    Limitations  House hold activities;Walking;Standing;Lifting    How long can you stand comfortably?  10-15 min    Patient Stated Goals  be more independent .  the bed takes my strength    Currently in Pain?  Yes    Pain Score  1     Pain Location  Knee    Pain Orientation  Right    Pain Descriptors / Indicators  Aching;Tiring    Pain Type  Surgical pain    Pain Onset  More than a month ago    Pain Frequency  Intermittent    Aggravating Factors   doing too much    Pain Relieving Factors  meds, rest, heat    Effect of Pain on Daily Activities  limits her mobility         [facility] PT Assessment - 03/24/19 0001      Assessment   Medical Diagnosis  Rt TKR revision     Referring Provider (PT)  Dr.Peter Durward Fortes    Onset Date/Surgical Date  02/28/19    Next MD Visit  04/04/19    Prior Therapy  Yes , HHPT       Precautions   Precautions  None    Precaution Comments  TKR       Restrictions   Weight Bearing Restrictions  No      Balance Screen   Has the patient fallen in the past 6 months  No      [place] residence     Living Arrangements  Spouse/significant other    Type of [place] to enter    Entrance Stairs-Number of Steps  8    [place]  One level    [place] - single point      Prior Function   Level of Independence  Independent with basic ADLs;Independent with household mobility with device;Independent with community mobility with device    Leisure  cooking      Cognition   Overall Cognitive Status  Within Functional Limits for tasks assessed      Circumferential Edema   Circumferential - Right  18 1/4 inch       Sensation   Light Touch  Appears Intact      AROM   Right Knee Extension  3    Right Knee Flexion  100   105 deg with AAROM      Palpation   Patella mobility  good     Palpation comment  well healed incision, not red or irritated      Ambulation/Gait   Assistive device  Rolling walker    Gait Pattern  Antalgic;Trunk flexed                Objective measurements completed on examination: See above findings.      [facility] Adult PT Treatment/Exercise - 03/24/19 0001      Self-Care   Self-Care  Heat/Ice Application;Other Self-Care Comments    Heat/Ice Application  heat is OK if no edema     Other Self-Care Comments   HEP, ROM       Knee/Hip Exercises: Stretches   Active Hamstring Stretch  Right;3 reps    Knee: Self-Stretch to increase Flexion  Right;3 reps      Knee/Hip Exercises: Supine   Quad Sets  Strengthening;Right;1 set;10 reps    Straight Leg Raises  Strengthening;Right;1 set;10 reps        PT Long Term Goals - 03/24/19 1106      PT LONG TERM GOAL #1   Title  Pt will be I with HEP for Rt knee ROM and strength    Time  6    Period  Weeks  Status  New    Target Date  05/04/19      PT LONG TERM GOAL #2   Title  Pt will be able to bend Rt knee to 120 deg flexion for more normal transfers and ease of gait    Time  6    Period  Weeks    Status  New    Target  Date  05/04/19      PT LONG TERM GOAL #3   Title  Pt will be able to demo 5/5 Rt knee strength for stability during ADLs, mobility    Time  6    Period  Weeks    Status  New    Target Date  05/04/19      PT LONG TERM GOAL #4   Title  Pt will be able to do light housework with pain < 3/10 most of the time with LRAD (cane vs walker)    Time  6    Period  Weeks    Status  New    Target Date  05/04/19             Plan - 03/24/19 1109    Clinical Impression Statement  Pt presents for low complexity eval of Rt TKA which was a revision of TKA about 11 yrs ago.  She had loosening of hardware over the years.  She is about 6 weeks out from this surgery and is doing quite well.  In the past 2 weeks she has noticed a decrease in ROM which was evidenced today.  Able to get to 100 deg with AAROM.  Extension is good. He strength is 4/5 in her Rt knee, MD would lke to her to cont with the walker for this eason.  She will do very well with consistent HEP and exercises in clinic for gait, strength and mobility.    Personal Factors and Comorbidities  Age;Comorbidity 1;Comorbidity 2;Past/Current Experience    Comorbidities  L TKR, previous Rt TKA, obesity    Examination-Activity Limitations  Lift;Stand;Locomotion Level;Bed Mobility;Transfers;Squat;Hygiene/Grooming;Stairs;Dressing;Sit;Bend    Examination-Participation Restrictions  Cleaning;Community Activity;Interpersonal Relationship;Laundry;Shop;Meal Prep    Stability/Clinical Decision Making  Stable/Uncomplicated    Clinical Decision Making  Low    Rehab Potential  Excellent    PT Frequency  2x / week    PT Duration  6 weeks    PT Treatment/Interventions  ADLs/Self Care Home Management;Cryotherapy;Electrical Stimulation;Therapeutic exercise;Balance training;Neuromuscular re-education;Stair training;Gait training;DME Instruction;Moist Heat;Functional mobility training;Therapeutic activities;Patient/family education;Manual techniques;Vasopneumatic  Device;Taping;Passive range of motion    PT Next Visit Plan  review her HHPT exercises and upgrade.  Bike vs Nustep, gait    PT Home Exercise Plan  HHPT    Consulted and Agree with Plan of Care  Patient       Patient will benefit from skilled therapeutic intervention in order to improve the following deficits and impairments:  Increased fascial restricitons, Pain, Decreased mobility, Postural dysfunction, Decreased strength, Decreased range of motion, Decreased activity tolerance, Difficulty walking, Increased edema, Impaired flexibility, Obesity, Decreased knowledge of use of DME, Decreased scar mobility  Visit Diagnosis: Muscle weakness (generalized)  S/P revision of total knee, right  Difficulty in walking, not elsewhere classified  Stiffness of right knee, not elsewhere classified     Problem List Patient Active Problem List   Diagnosis Date Noted  . Pain in right knee 02/14/2019  . S/P revision of total knee, right 01/31/2019  . 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 Sleep apnea  01/31/2018  . Atrophic vaginitis 01/31/2018  . Hyponatremia 01/31/2018  . Increased body mass index 01/31/2018  . Osteoarthritis 01/31/2018  . Postmenopausal bleeding 01/31/2018  . Systemic lupus erythematosus ([place]) 01/31/2018  . Uterine leiomyoma 01/31/2018  . Trigger middle finger of left hand 01/11/2017  . 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 High risk medication use 01/06/2016  . 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 Vitamin D deficiency 01/06/2016  . Deep venous thrombosis of lower extremity ([place]) 06/04/2015  . Osteopenia 03/14/2012  . Diabetes mellitus type 2 in obese ([place]) 03/14/2012  . 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 Hypertension 03/14/2012    Kaitlyn Jackson 03/24/2019, 11:14 AM  [place], [place], [address] Phone: [phone]   Fax:  [phone]  Name: Kaitlyn Jackson MRN: [id number] Date of Birth: 1945/12/31   Raeford Razor, PT 03/24/19 11:14 AM Phone: [phone] Fax: [phone]

## 2019-03-27 ENCOUNTER — Ambulatory Visit: Payer: Medicare Other | Admitting: Physical Therapy

## 2019-03-28 DIAGNOSIS — I10 Essential (primary) hypertension: Secondary | ICD-10-CM | POA: Diagnosis not present

## 2019-03-28 DIAGNOSIS — E1169 Type 2 diabetes mellitus with other specified complication: Secondary | ICD-10-CM | POA: Diagnosis not present

## 2019-03-28 DIAGNOSIS — E039 Hypothyroidism, unspecified: Secondary | ICD-10-CM | POA: Diagnosis not present

## 2019-03-28 DIAGNOSIS — Z7901 Long term (current) use of anticoagulants: Secondary | ICD-10-CM | POA: Diagnosis not present

## 2019-04-04 ENCOUNTER — Other Ambulatory Visit: Payer: Self-pay

## 2019-04-04 ENCOUNTER — Encounter: Payer: Self-pay | Admitting: Physical Therapy

## 2019-04-04 ENCOUNTER — Ambulatory Visit (INDEPENDENT_AMBULATORY_CARE_PROVIDER_SITE_OTHER): Payer: Medicare Other | Admitting: Orthopaedic Surgery

## 2019-04-04 ENCOUNTER — Encounter: Payer: Self-pay | Admitting: Orthopaedic Surgery

## 2019-04-04 ENCOUNTER — Ambulatory Visit: Payer: Medicare Other | Admitting: Physical Therapy

## 2019-04-04 VITALS — Ht 64.0 in | Wt 239.0 lb

## 2019-04-04 DIAGNOSIS — M6281 Muscle weakness (generalized): Secondary | ICD-10-CM

## 2019-04-04 DIAGNOSIS — M25661 Stiffness of right knee, not elsewhere classified: Secondary | ICD-10-CM

## 2019-04-04 DIAGNOSIS — Z96651 Presence of right artificial knee joint: Secondary | ICD-10-CM

## 2019-04-04 DIAGNOSIS — R262 Difficulty in walking, not elsewhere classified: Secondary | ICD-10-CM

## 2019-04-04 NOTE — Progress Notes (Signed)
Office Visit Note   [name]           Date of Birth: March 16, 1946           MRN: [id number] Visit Date: 04/04/2019              Requested by: Lucianne Lei, [address] [place],  [place] [address] PCP: Lucianne Lei, MD   Assessment & Plan: Visit Diagnoses:  1. S/P revision of total knee, right     Plan: 2 months status post revision of right total knee replacement involving the tibial component.  The femoral component  felt perfectly stable.  Doing much better.  Only occasionally takes pain pills.  Ready to progress to a cane.  Does not use any ambulatory aid at home.  Would like to start driving.  Would urge her to continue with her strengthening exercises and return to see me in 3 months.  I expect her course will be slow but progressive based on her inactivity before surgery and her weight.  Follow-Up Instructions: Return in about 3 months (around 07/03/2019).   Orders:  No orders of the defined types were placed in this encounter.  No orders of the defined types were placed in this encounter.     Procedures: No procedures performed   Clinical Data: No additional findings.   Subjective: Chief Complaint  Patient presents with  . Right Knee - Pain    Right total knee revision DOS 01/31/2019  Patient presents today for a one month follow up on her right knee. She had a right knee total revision on 01/31/2019. She is doing well. She is going to physical therapy. She is walking with the assistance of a walker. She is taking oxycodone or tylenol as needed.   HPI  Review of Systems   Objective: Vital Signs: Ht 5\' 4"  (1.626 m)   Wt 239 lb (108.4 kg)   BMI 41.02 kg/m   Physical Exam  Ortho Exam right knee incision looks just fine.  No redness.  No significant pain.  Opens about 3 mm medially with a valgus stress with a hard endpoint.  Full extension and flexed over 95 degrees.  No calf pain.  No distal edema.  Motor exam intact  Specialty  Comments:  No specialty comments available.  Imaging: No results found.   PMFS History: Patient Active Problem List   Diagnosis Date Noted  . Pain in right knee 02/14/2019  . S/P revision of total knee, right 01/31/2019  . 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 Sleep apnea 01/31/2018  . Atrophic vaginitis 01/31/2018  . Hyponatremia 01/31/2018  . Increased body mass index 01/31/2018  . Osteoarthritis 01/31/2018  . Postmenopausal bleeding 01/31/2018  . Systemic lupus erythematosus ([place]) 01/31/2018  . Uterine leiomyoma 01/31/2018  . Trigger middle finger of left hand 01/11/2017  . 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 High risk medication use 01/06/2016  . 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 Vitamin D deficiency 01/06/2016  . Deep venous thrombosis of lower extremity ([place]) 06/04/2015  . Osteopenia 03/14/2012  . Diabetes mellitus type 2 in obese ([place]) 03/14/2012  . 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 Hypertension 03/14/2012   Past Medical History:  Diagnosis Date  . Clotting disorder ([place])   . Cough 02/08/2017  . Diabetic retinopathy ([place])   . Discoid lupus    states currently  in remission  . Full dentures   . GERD (gastroesophageal reflux disease)   . Heart murmur   . History of blood clots 1996   groin  . History of glaucoma    had laser correction  . Hypertension    states under control with meds., has been on med. since 1996  . Insulin dependent diabetes mellitus   . Mitral valve prolapse   . Osteoarthritis    bilateral knee  . Seasonal allergies   . Sleep apnea    no CPAP use in several months, per pt.  . Trigger finger, left middle finger 01/2017    Family History  Problem Relation Age of Onset  . Cancer Mother        colon  . Cancer Cousin        lung    Past Surgical History:  Procedure Laterality Date  . BACK  SURGERY     lower - removed  cysts  . CATARACT EXTRACTION W/ INTRAOCULAR LENS  IMPLANT, BILATERAL Bilateral   . GLAUCOMA SURGERY Bilateral    laser  . HEMILAMINOTOMY LUMBAR SPINE Right 01/30/2009   L5  . JOINT REPLACEMENT    . TOTAL KNEE ARTHROPLASTY Right 09/22/2007  . TOTAL KNEE ARTHROPLASTY Left 02/01/2007  . TOTAL KNEE REVISION Right 01/31/2019   Procedure: RIGHT TOTAL KNEE REVISION;  Surgeon: [name], MD;  [facility];  Service: Orthopedics;  Laterality: Right;  . TRIGGER FINGER RELEASE Right 12/14/2013   Procedure: RELEASE TRIGGER FINGER/A-1 PULLEY RIGHT RING FINGER;  Surgeon: Daryll Brod, MD;  Location: [facility];  Service: Orthopedics;  Laterality: Right;  . TRIGGER FINGER RELEASE Left 02/11/2017   Procedure: RELEASE TRIGGER FINGER/A-1 PULLEY;  Surgeon: Leanora Cover, MD;  Location: [facility];  Service: Orthopedics;  Laterality: Left;   Social History   Occupational History  . Not on file  Tobacco Use  . Smoking status: Never Smoker  . Smokeless tobacco: Never Used  Substance and Sexual Activity  . Alcohol use: No  . Drug use: No  . Sexual activity: Not on file

## 2019-04-04 NOTE — Therapy (Signed)
[place], [place], [address] Phone: [phone]   Fax:  [phone]  Physical Therapy Treatment  Patient Details  Name: Kaitlyn Jackson MRN: [id number] Date of Birth: May 24, 1945 Referring Provider (PT): Dr.Peter Durward Fortes   Encounter Date: 04/04/2019  PT End of Session - 04/04/19 1056    Visit Number  2    Number of Visits  12    Date for PT Re-Evaluation  05/04/19    Authorization Type  UHC MCR MCD    PT Start Time  [id number]    PT Stop Time  1130    PT Time Calculation (min)  43 min    Activity Tolerance  Patient tolerated treatment well    Behavior During Therapy  [facility] for tasks assessed/performed       Past Medical History:  Diagnosis Date  . Clotting disorder ([place])   . Cough 02/08/2017  . Diabetic retinopathy ([place])   . 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 Full dentures   . GERD (gastroesophageal reflux disease)   . Heart murmur   . History of blood clots 1996   groin  . History of glaucoma    had laser correction  . Hypertension    states under control with meds., has been on med. since 1996  . Insulin dependent diabetes mellitus   . Mitral valve prolapse   . Osteoarthritis    bilateral knee  . Seasonal allergies   . Sleep apnea    no CPAP use in several months, per pt.  . Trigger finger, left middle finger 01/2017    Past Surgical History:  Procedure Laterality Date  . BACK SURGERY     lower - removed  cysts  . CATARACT EXTRACTION W/ INTRAOCULAR LENS  IMPLANT, BILATERAL Bilateral   . GLAUCOMA SURGERY Bilateral    laser  . HEMILAMINOTOMY LUMBAR SPINE Right 01/30/2009   L5  . JOINT REPLACEMENT    . TOTAL KNEE ARTHROPLASTY Right 09/22/2007  . TOTAL KNEE ARTHROPLASTY Left 02/01/2007  . TOTAL KNEE REVISION Right 01/31/2019   Procedure: RIGHT TOTAL KNEE REVISION;  Surgeon: [name], MD;  [facility];  Service: Orthopedics;  Laterality: Right;  .  TRIGGER FINGER RELEASE Right 12/14/2013   Procedure: RELEASE TRIGGER FINGER/A-1 PULLEY RIGHT RING FINGER;  Surgeon: Daryll Brod, MD;  Location: [facility];  Service: Orthopedics;  Laterality: Right;  . TRIGGER FINGER RELEASE Left 02/11/2017   Procedure: RELEASE TRIGGER FINGER/A-1 PULLEY;  Surgeon: Leanora Cover, MD;  Location: [facility];  Service: Orthopedics;  Laterality: Left;    There were no vitals filed for this visit.  Subjective Assessment - 04/04/19 1050    Subjective  I have been doing OK.  At home has been trying to walk without anything. Sees MD today.    Currently in Pain?  No/denies   soreness in knees, no pain, back is 4/10-5/10        [facility] PT Assessment - 04/04/19 0001      AROM   Right Knee Flexion  105       [facility] Adult PT Treatment/Exercise - 04/04/19 0001      Knee/Hip Exercises: Stretches   Active Hamstring Stretch  Right;5 reps    Knee: Self-Stretch to increase Flexion  Right;5 reps      Knee/Hip Exercises: Aerobic   Nustep  5 min L4 Ue and LE       Knee/Hip Exercises: Standing  Heel Raises  1 set;10 reps    Hip Flexion  Stengthening;Right;1 set;15 reps    Hip Flexion Limitations  standing high march Rt LE       Knee/Hip Exercises: Seated   Long Scientist, clinical (histocompatibility and immunogenetics)    Long Arc Quad Weight  3 lbs.    Long Arc Quad Limitations  min pain     Sit to [employer]  1 set;10 reps;without UE support   unable      Knee/Hip Exercises: Supine   Quad Sets  Strengthening;Right;1 set;20 reps    Short Arc [employer]  Strengthening;Right;2 sets;10 reps    Short Arc [employer] Limitations  3 lbs     Straight Leg Raises  Strengthening;Right;1 set;10 reps    Patellar Mobs  gentle       Manual Therapy   Joint Mobilization  patellar    Soft tissue mobilization  scar tissue proximal, gentle             PT Education - 04/04/19 1136    Education Details  HEP, scar tissue    Person(s) Educated  Patient    Methods   Explanation;Demonstration;Handout    Comprehension  Verbalized understanding;Returned demonstration          PT Long Term Goals - 03/24/19 1106      PT LONG TERM GOAL #1   Title  Pt will be I with HEP for Rt knee ROM and strength    Time  6    Period  Weeks    Status  New    Target Date  05/04/19      PT LONG TERM GOAL #2   Title  Pt will be able to bend Rt knee to 120 deg flexion for more normal transfers and ease of gait    Time  6    Period  Weeks    Status  New    Target Date  05/04/19      PT LONG TERM GOAL #3   Title  Pt will be able to demo 5/5 Rt knee strength for stability during ADLs, mobility    Time  6    Period  Weeks    Status  New    Target Date  05/04/19      PT LONG TERM GOAL #4   Title  Pt will be able to do light housework with pain < 3/10 most of the time with LRAD (cane vs walker)    Time  6    Period  Weeks    Status  New    Target Date  05/04/19            Plan - 04/04/19 1136    Clinical Impression Statement  Pt able to walk in clinic with min to no difficulty. She maintains a decent knee AROM.  Needs to work on endurance and Rt quad strength. Has a moderate amt of back pain today may benefit from core to assist with this.  Gave HEP as she did not have with her and admits to being less than consistent with it.  No pain post, declined ice.    PT Next Visit Plan  standing as tolerated, knee, hip , core.  Nustep. try SLR(unable to do today)    PT Home Exercise Plan  mini squat, LAQ, knee flexion stretch, hip abd    Consulted and Agree with Plan of Care  Patient       Patient will benefit from skilled therapeutic  intervention in order to improve the following deficits and impairments:  Increased fascial restricitons, Pain, Decreased mobility, Postural dysfunction, Decreased strength, Decreased range of motion, Decreased activity tolerance, Difficulty walking, Increased edema, Impaired flexibility, Obesity, Decreased knowledge of use of DME,  Decreased scar mobility  Visit Diagnosis: Muscle weakness (generalized)  S/P revision of total knee, right  Difficulty in walking, not elsewhere classified  Stiffness of right knee, not elsewhere classified     Problem List Patient Active Problem List   Diagnosis Date Noted  . Pain in right knee 02/14/2019  . S/P revision of total knee, right 01/31/2019  . 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 Sleep apnea 01/31/2018  . Atrophic vaginitis 01/31/2018  . Hyponatremia 01/31/2018  . Increased body mass index 01/31/2018  . Osteoarthritis 01/31/2018  . Postmenopausal bleeding 01/31/2018  . Systemic lupus erythematosus ([place]) 01/31/2018  . Uterine leiomyoma 01/31/2018  . Trigger middle finger of left hand 01/11/2017  . 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 High risk medication use 01/06/2016  . 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 Vitamin D deficiency 01/06/2016  . Deep venous thrombosis of lower extremity ([place]) 06/04/2015  . Osteopenia 03/14/2012  . Diabetes mellitus type 2 in obese ([place]) 03/14/2012  . 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 Hypertension 03/14/2012    Cindy Brindisi 04/04/2019, 1:07 PM  [facility] [address] [place], [place], [address] Phone: [phone]   Fax:  [phone]  Name: Kaitlyn Jackson MRN: [id number] Date of Birth: [birth year]  Raeford Razor, PT 04/04/19 1:07 PM Phone: [phone] Fax: [phone]

## 2019-04-04 NOTE — Patient Instructions (Signed)
Access Code: [email or web address]  URL: [email or web address]  Date: 04/04/2019  Prepared by: Raeford Razor   Exercises  Supine Posterior Pelvic Tilt - 10 reps - 2 sets - 10 hold - 2x daily - 7x weekly  Seated Long Arc Quad - 10 reps - 2 sets - 10 hold - 2x daily - 7x weekly  Supine Heel Slide with Strap - 5 reps - 2 sets - 30 hold - 2x daily - 7x weekly  Mini Squat with Chair - 10 reps - 2 sets - 5 hold - 2x daily - 7x weekly  Standing Hip Abduction with Counter Support - 10 reps - 2 sets - 5 hold - 2x daily - 7x weekly

## 2019-04-07 ENCOUNTER — Encounter: Payer: Self-pay | Admitting: Physical Therapy

## 2019-04-07 ENCOUNTER — Ambulatory Visit: Payer: Medicare Other | Admitting: Physical Therapy

## 2019-04-07 ENCOUNTER — Other Ambulatory Visit: Payer: Self-pay

## 2019-04-07 DIAGNOSIS — R262 Difficulty in walking, not elsewhere classified: Secondary | ICD-10-CM | POA: Diagnosis not present

## 2019-04-07 DIAGNOSIS — Z96651 Presence of right artificial knee joint: Secondary | ICD-10-CM | POA: Diagnosis not present

## 2019-04-07 DIAGNOSIS — M25661 Stiffness of right knee, not elsewhere classified: Secondary | ICD-10-CM | POA: Diagnosis not present

## 2019-04-07 DIAGNOSIS — M6281 Muscle weakness (generalized): Secondary | ICD-10-CM

## 2019-04-07 NOTE — Therapy (Signed)
[place], [place], [address] Phone: [phone]   Fax:  [phone]  Physical Therapy Treatment  Patient Details  Name: Kaitlyn Jackson MRN: [id number] Date of Birth: May 27, 1945 Referring Provider (PT): Dr.Peter Durward Fortes   Encounter Date: 04/07/2019  PT End of Session - 04/07/19 0939    Visit Number  3    Number of Visits  12    Date for PT Re-Evaluation  05/04/19    Authorization Type  UHC MCR MCD    PT Start Time  0930    PT Stop Time  1018    PT Time Calculation (min)  48 min       Past Medical History:  Diagnosis Date  . Clotting disorder ([place])   . Cough 02/08/2017  . Diabetic retinopathy ([place])   . 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 Full dentures   . GERD (gastroesophageal reflux disease)   . Heart murmur   . History of blood clots 1996   groin  . History of glaucoma    had laser correction  . Hypertension    states under control with meds., has been on med. since 1996  . Insulin dependent diabetes mellitus   . Mitral valve prolapse   . Osteoarthritis    bilateral knee  . Seasonal allergies   . Sleep apnea    no CPAP use in several months, per pt.  . Trigger finger, left middle finger 01/2017    Past Surgical History:  Procedure Laterality Date  . BACK SURGERY     lower - removed  cysts  . CATARACT EXTRACTION W/ INTRAOCULAR LENS  IMPLANT, BILATERAL Bilateral   . GLAUCOMA SURGERY Bilateral    laser  . HEMILAMINOTOMY LUMBAR SPINE Right 01/30/2009   L5  . JOINT REPLACEMENT    . TOTAL KNEE ARTHROPLASTY Right 09/22/2007  . TOTAL KNEE ARTHROPLASTY Left 02/01/2007  . TOTAL KNEE REVISION Right 01/31/2019   Procedure: RIGHT TOTAL KNEE REVISION;  Surgeon: [name], MD;  [facility];  Service: Orthopedics;  Laterality: Right;  . TRIGGER FINGER RELEASE Right 12/14/2013   Procedure: RELEASE TRIGGER FINGER/A-1 PULLEY RIGHT RING FINGER;  Surgeon: Daryll Brod, MD;  Location: [place];  Service: Orthopedics;  Laterality: Right;  . TRIGGER FINGER RELEASE Left 02/11/2017   Procedure: RELEASE TRIGGER FINGER/A-1 PULLEY;  Surgeon: Leanora Cover, MD;  Location: [facility];  Service: Orthopedics;  Laterality: Left;    There were no vitals filed for this visit.  Subjective Assessment - 04/07/19 0934    Subjective  A little sore. Did well after last session. Started driving yesterday for the first time. Saw MD after last visit. He said I could discontinue RW as long as I feel stable. So far I am not using cane in my home and I do not have any trouble.    Currently in Pain?  Yes    Pain Score  4     Pain Location  Knee    Pain Orientation  Right    Pain Descriptors / Indicators  Sore    Pain Type  Surgical pain    Aggravating Factors   doing too much    Pain Relieving Factors  meds, rest heat                       [facility] Adult PT Treatment/Exercise - 04/07/19 0001  Knee/Hip Exercises: Aerobic   Nustep  5 min L5 Ue and LE       Knee/Hip Exercises: Standing   Heel Raises  15 reps    Hip Flexion  Stengthening;Right;Left;1 set;15 reps    Functional Squat  10 reps    Functional Squat Limitations  cues for hip hinge      Knee/Hip Exercises: Seated   Long Arc Quad  1 set;20 reps    Long Arc Quad Weight  3 lbs.    Hamstring Curl  10 reps    Hamstring Limitations  red band       Knee/Hip Exercises: Supine   Quad Sets  Strengthening;Right;1 set;20 reps    Quad Sets Limitations  1 set in sitting    Short Arc Quad Sets  Strengthening;Right;2 sets;10 reps    Short Arc Quad Sets Limitations  3    Heel Slides  10 reps    Heel Slides Limitations  shoe off     Straight Leg Raises  5 reps    Patellar Mobs  gentle       Modalities   Modalities  Moist Heat      Moist Heat Therapy   Number Minutes Moist Heat  10 Minutes    Moist Heat Location  Knee      Manual Therapy   Joint Mobilization   patellar                  PT Long Term Goals - 03/24/19 1106      PT LONG TERM GOAL #1   Title  Pt will be I with HEP for Rt knee ROM and strength    Time  6    Period  Weeks    Status  New    Target Date  05/04/19      PT LONG TERM GOAL #2   Title  Pt will be able to bend Rt knee to 120 deg flexion for more normal transfers and ease of gait    Time  6    Period  Weeks    Status  New    Target Date  05/04/19      PT LONG TERM GOAL #3   Title  Pt will be able to demo 5/5 Rt knee strength for stability during ADLs, mobility    Time  6    Period  Weeks    Status  New    Target Date  05/04/19      PT LONG TERM GOAL #4   Title  Pt will be able to do light housework with pain < 3/10 most of the time with LRAD (cane vs walker)    Time  6    Period  Weeks    Status  New    Target Date  05/04/19            Plan - 04/07/19 1024    Clinical Impression Statement  Pt reports MD discontinued RW if she feels stable. She has difficulty with full extension as well as fatigue after 5 reps of SLR and was encouraged to increase frequency of seated and supine quad strength as tolerated. She liked the knee bolster and asked where she could purchase one. She is maintaining 100 degrees of knee flexion and passively has full extension.    PT Next Visit Plan  standing as tolerated, knee, hip , core.  Nustep. try SLR able to do 5 reps today    PT Home Exercise Plan  mini squat, LAQ, knee flexion stretch, hip abd       Patient will benefit from skilled therapeutic intervention in order to improve the following deficits and impairments:  Increased fascial restricitons, Pain, Decreased mobility, Postural dysfunction, Decreased strength, Decreased range of motion, Decreased activity tolerance, Difficulty walking, Increased edema, Impaired flexibility, Obesity, Decreased knowledge of use of DME, Decreased scar mobility  Visit Diagnosis: Muscle weakness (generalized)  S/P revision of  total knee, right  Difficulty in walking, not elsewhere classified     Problem List Patient Active Problem List   Diagnosis Date Noted  . Pain in right knee 02/14/2019  . S/P revision of total knee, right 01/31/2019  . 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 Sleep apnea 01/31/2018  . Atrophic vaginitis 01/31/2018  . Hyponatremia 01/31/2018  . Increased body mass index 01/31/2018  . Osteoarthritis 01/31/2018  . Postmenopausal bleeding 01/31/2018  . Systemic lupus erythematosus ([place]) 01/31/2018  . Uterine leiomyoma 01/31/2018  . Trigger middle finger of left hand 01/11/2017  . 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 High risk medication use 01/06/2016  . 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 Vitamin D deficiency 01/06/2016  . Deep venous thrombosis of lower extremity ([place]) 06/04/2015  . Osteopenia 03/14/2012  . Diabetes mellitus type 2 in obese ([place]) 03/14/2012  . 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 Hypertension 03/14/2012    Dorene Ar, PTA 04/07/2019, 10:27 AM  [facility] [address] [place], [place], [address] Phone: [phone]   Fax:  [phone]  Name: ANGELIKA DERMER MRN: [id number] Date of Birth: 10-11-1945

## 2019-04-11 ENCOUNTER — Other Ambulatory Visit: Payer: Self-pay

## 2019-04-11 ENCOUNTER — Encounter: Payer: Self-pay | Admitting: Physical Therapy

## 2019-04-11 ENCOUNTER — Ambulatory Visit: Payer: Medicare Other | Admitting: Physical Therapy

## 2019-04-11 DIAGNOSIS — R262 Difficulty in walking, not elsewhere classified: Secondary | ICD-10-CM

## 2019-04-11 DIAGNOSIS — M25661 Stiffness of right knee, not elsewhere classified: Secondary | ICD-10-CM | POA: Diagnosis not present

## 2019-04-11 DIAGNOSIS — Z96651 Presence of right artificial knee joint: Secondary | ICD-10-CM | POA: Diagnosis not present

## 2019-04-11 DIAGNOSIS — M6281 Muscle weakness (generalized): Secondary | ICD-10-CM | POA: Diagnosis not present

## 2019-04-11 NOTE — Therapy (Signed)
[place], [place], [address] Phone: [phone]   Fax:  [phone]  Physical Therapy Treatment  Patient Details  Name: Kaitlyn Jackson MRN: [id number] Date of Birth: 06-Dec-1945 Referring Provider (PT): Dr.Peter Durward Fortes   Encounter Date: 04/11/2019  PT End of Session - 04/11/19 0940    Visit Number  4    Number of Visits  12    Date for PT Re-Evaluation  05/04/19    Authorization Type  UHC MCR MCD    PT Start Time  0932    PT Stop Time  1020    PT Time Calculation (min)  48 min       Past Medical History:  Diagnosis Date  . Clotting disorder ([place])   . Cough 02/08/2017  . Diabetic retinopathy ([place])   . 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 Full dentures   . GERD (gastroesophageal reflux disease)   . Heart murmur   . History of blood clots 1996   groin  . History of glaucoma    had laser correction  . Hypertension    states under control with meds., has been on med. since 1996  . Insulin dependent diabetes mellitus   . Mitral valve prolapse   . Osteoarthritis    bilateral knee  . Seasonal allergies   . Sleep apnea    no CPAP use in several months, per pt.  . Trigger finger, left middle finger 01/2017    Past Surgical History:  Procedure Laterality Date  . BACK SURGERY     lower - removed  cysts  . CATARACT EXTRACTION W/ INTRAOCULAR LENS  IMPLANT, BILATERAL Bilateral   . GLAUCOMA SURGERY Bilateral    laser  . HEMILAMINOTOMY LUMBAR SPINE Right 01/30/2009   L5  . JOINT REPLACEMENT    . TOTAL KNEE ARTHROPLASTY Right 09/22/2007  . TOTAL KNEE ARTHROPLASTY Left 02/01/2007  . TOTAL KNEE REVISION Right 01/31/2019   Procedure: RIGHT TOTAL KNEE REVISION;  Surgeon: [name], MD;  [facility];  Service: Orthopedics;  Laterality: Right;  . TRIGGER FINGER RELEASE Right 12/14/2013   Procedure: RELEASE TRIGGER FINGER/A-1 PULLEY RIGHT RING FINGER;  Surgeon: Daryll Brod, MD;  Location: [facility];  Service: Orthopedics;  Laterality: Right;  . TRIGGER FINGER RELEASE Left 02/11/2017   Procedure: RELEASE TRIGGER FINGER/A-1 PULLEY;  Surgeon: Leanora Cover, MD;  Location: [facility];  Service: Orthopedics;  Laterality: Left;    There were no vitals filed for this visit.  Subjective Assessment - 04/11/19 0939    Subjective  Pain 2/10. Doing the exercises.    Currently in Pain?  Yes    Pain Score  2     Pain Location  Knee    Pain Orientation  Right    Pain Descriptors / Indicators  Sore    Pain Type  Surgical pain                       [facility] Adult PT Treatment/Exercise - 04/11/19 0001      Knee/Hip Exercises: Stretches   Active Hamstring Stretch  Right;5 reps    Active Hamstring Stretch Limitations  seated foot on floor     Knee: Self-Stretch to increase Flexion  Right;3 reps    Knee: Self-Stretch Limitations  seated      Knee/Hip Exercises: Aerobic   Nustep  5 min L5 Ue and LE  Knee/Hip Exercises: Standing   Functional Squat  10 reps    Functional Squat Limitations  cues for hip hinge      Knee/Hip Exercises: Seated   Long Arc Quad  1 set;20 reps    Long Arc Quad Weight  3 lbs.    Marching  Right;10 reps    Marching Weights  3 lbs.    Hamstring Curl  10 reps    Hamstring Limitations  2 sets red band     Sit to [employer]  10 reps      Knee/Hip Exercises: Supine   Quad Sets  Strengthening;Right;1 set;20 reps    Quad Sets Limitations  1 set in sitting     Short Arc Quad Sets  Strengthening;Right;2 sets;10 reps    Short Arc Quad Sets Limitations  4    Bridges  10 reps    Straight Leg Raises  10 reps      Moist Heat Therapy   Number Minutes Moist Heat  10 Minutes    Moist Heat Location  Knee                  PT Long Term Goals - 03/24/19 1106      PT LONG TERM GOAL #1   Title  Pt will be I with HEP for Rt knee ROM and strength    Time  6    Period  Weeks    Status  New     Target Date  05/04/19      PT LONG TERM GOAL #2   Title  Pt will be able to bend Rt knee to 120 deg flexion for more normal transfers and ease of gait    Time  6    Period  Weeks    Status  New    Target Date  05/04/19      PT LONG TERM GOAL #3   Title  Pt will be able to demo 5/5 Rt knee strength for stability during ADLs, mobility    Time  6    Period  Weeks    Status  New    Target Date  05/04/19      PT LONG TERM GOAL #4   Title  Pt will be able to do light housework with pain < 3/10 most of the time with LRAD (cane vs walker)    Time  6    Period  Weeks    Status  New    Target Date  05/04/19            Plan - 04/11/19 1039    Clinical Impression Statement  Pt reports low level of pan and compliance with HEP. She is able to tolerate increased reps of quad and hip flexor strengthening.    PT Next Visit Plan  standing as tolerated, knee, hip , core.  Nustep. try SLR able to do 10 reps today    PT Home Exercise Plan  mini squat, LAQ, knee flexion stretch, hip abd       Patient will benefit from skilled therapeutic intervention in order to improve the following deficits and impairments:  Increased fascial restricitons, Pain, Decreased mobility, Postural dysfunction, Decreased strength, Decreased range of motion, Decreased activity tolerance, Difficulty walking, Increased edema, Impaired flexibility, Obesity, Decreased knowledge of use of DME, Decreased scar mobility  Visit Diagnosis: Muscle weakness (generalized)  S/P revision of total knee, right  Difficulty in walking, not elsewhere classified  Stiffness of right knee, not elsewhere classified  Problem List Patient Active Problem List   Diagnosis Date Noted  . Pain in right knee 02/14/2019  . S/P revision of total knee, right 01/31/2019  . 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 Sleep apnea 01/31/2018  . Atrophic vaginitis 01/31/2018  . Hyponatremia 01/31/2018  .  Increased body mass index 01/31/2018  . Osteoarthritis 01/31/2018  . Postmenopausal bleeding 01/31/2018  . Systemic lupus erythematosus ([place]) 01/31/2018  . Uterine leiomyoma 01/31/2018  . Trigger middle finger of left hand 01/11/2017  . 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 High risk medication use 01/06/2016  . 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 Vitamin D deficiency 01/06/2016  . Deep venous thrombosis of lower extremity ([place]) 06/04/2015  . Osteopenia 03/14/2012  . Diabetes mellitus type 2 in obese ([place]) 03/14/2012  . 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 Hypertension 03/14/2012    Dorene Ar, PTA 04/11/2019, 10:42 AM  [facility] [address] [place], [place], [address] Phone: [phone]   Fax:  [phone]  Name: JAZZALYNN MILER MRN: [id number] Date of Birth: 03-Sep-1945

## 2019-04-13 ENCOUNTER — Ambulatory Visit: Payer: Medicare Other | Admitting: Physical Therapy

## 2019-04-17 ENCOUNTER — Encounter (INDEPENDENT_AMBULATORY_CARE_PROVIDER_SITE_OTHER): Payer: Medicare Other | Admitting: Ophthalmology

## 2019-04-18 ENCOUNTER — Encounter: Payer: Self-pay | Admitting: Physical Therapy

## 2019-04-18 ENCOUNTER — Other Ambulatory Visit: Payer: Self-pay

## 2019-04-18 ENCOUNTER — Ambulatory Visit: Payer: Medicare Other | Attending: Orthopaedic Surgery | Admitting: Physical Therapy

## 2019-04-18 DIAGNOSIS — R262 Difficulty in walking, not elsewhere classified: Secondary | ICD-10-CM | POA: Insufficient documentation

## 2019-04-18 DIAGNOSIS — M6281 Muscle weakness (generalized): Secondary | ICD-10-CM | POA: Diagnosis not present

## 2019-04-18 DIAGNOSIS — Z96651 Presence of right artificial knee joint: Secondary | ICD-10-CM | POA: Insufficient documentation

## 2019-04-18 DIAGNOSIS — M25661 Stiffness of right knee, not elsewhere classified: Secondary | ICD-10-CM | POA: Insufficient documentation

## 2019-04-18 NOTE — Patient Instructions (Signed)
Access Code: [email or web address]  URL: [email or web address]  Date: 04/18/2019  Prepared by: Raeford Razor   Exercises  Supine Lower Trunk Rotation - 10 reps - 2 sets - 10 hold - 1x daily - 7x weekly  Supine Hamstring Stretch with Strap - 3 reps - 2 sets - 30 hold - 1x daily - 7x weekly  Hooklying Single Knee to Chest Stretch - 3 reps - 2 sets - 30 hold - 1x daily - 7x weekly

## 2019-04-18 NOTE — Therapy (Signed)
[place], [place], [address] Phone: [phone]   Fax:  [phone]  Physical Therapy Treatment  Patient Details  Name: Kaitlyn Jackson MRN: [id number] Date of Birth: 1945/04/04 Referring Provider (PT): Dr.Peter Durward Fortes   Encounter Date: 04/18/2019  PT End of Session - 04/18/19 1211    Visit Number  5    Number of Visits  12    Date for PT Re-Evaluation  05/04/19    Authorization Type  UHC MCR MCD    PT Start Time  1055    PT Stop Time  1130    PT Time Calculation (min)  35 min    Activity Tolerance  Patient tolerated treatment well    Behavior During Therapy  [facility] for tasks assessed/performed       Past Medical History:  Diagnosis Date  . Clotting disorder ([place])   . Cough 02/08/2017  . Diabetic retinopathy ([place])   . 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 Full dentures   . GERD (gastroesophageal reflux disease)   . Heart murmur   . History of blood clots 1996   groin  . History of glaucoma    had laser correction  . Hypertension    states under control with meds., has been on med. since 1996  . Insulin dependent diabetes mellitus   . Mitral valve prolapse   . Osteoarthritis    bilateral knee  . Seasonal allergies   . Sleep apnea    no CPAP use in several months, per pt.  . Trigger finger, left middle finger 01/2017    Past Surgical History:  Procedure Laterality Date  . BACK SURGERY     lower - removed  cysts  . CATARACT EXTRACTION W/ INTRAOCULAR LENS  IMPLANT, BILATERAL Bilateral   . GLAUCOMA SURGERY Bilateral    laser  . HEMILAMINOTOMY LUMBAR SPINE Right 01/30/2009   L5  . JOINT REPLACEMENT    . TOTAL KNEE ARTHROPLASTY Right 09/22/2007  . TOTAL KNEE ARTHROPLASTY Left 02/01/2007  . TOTAL KNEE REVISION Right 01/31/2019   Procedure: RIGHT TOTAL KNEE REVISION;  Surgeon: [name], MD;  [facility];  Service: Orthopedics;  Laterality: Right;  . TRIGGER  FINGER RELEASE Right 12/14/2013   Procedure: RELEASE TRIGGER FINGER/A-1 PULLEY RIGHT RING FINGER;  Surgeon: Daryll Brod, MD;  Location: [facility];  Service: Orthopedics;  Laterality: Right;  . TRIGGER FINGER RELEASE Left 02/11/2017   Procedure: RELEASE TRIGGER FINGER/A-1 PULLEY;  Surgeon: Leanora Cover, MD;  Location: [facility];  Service: Orthopedics;  Laterality: Left;    There were no vitals filed for this visit.  Subjective Assessment - 04/18/19 1059    Subjective  Knee is OK, it is stiff.  Back is the thing that I am feeling the most. Took some medicine.    Currently in Pain?  Yes    Pain Score  2     Multiple Pain Sites  Yes    Pain Score  8    Pain Location  Back    Pain Orientation  Lower;Left    Pain Descriptors / Indicators  Aching    Pain Type  Chronic pain    Pain Onset  More than a month ago    Pain Frequency  Intermittent    Aggravating Factors   standing    Pain Relieving Factors  sitting , heat         [facility] PT Assessment -  04/18/19 0001      AROM   Right Knee Flexion  112        [facility] Adult PT Treatment/Exercise - 04/18/19 0001      Knee/Hip Exercises: Stretches   Active Hamstring Stretch  Right;Left;3 reps    Knee: Self-Stretch to increase Flexion  Right;3 reps    Knee: Self-Stretch Limitations  strap     Other Knee/Hip Stretches  knee to chest with knee flexion x3       Knee/Hip Exercises: Standing   Forward Step Up  Right;1 set;15 reps;Hand Hold: 2;Step Height: 6"    Forward Step Up Limitations  close supervision , cues     Wall Squat  2 sets;10 reps    Wall Squat Limitations  needed seated rest break post due to fatigue              PT Education - 04/18/19 1215    Education Details  back pain, HEP, standing ex    Person(s) Educated  Patient    Methods  Explanation;Demonstration;Handout    Comprehension  Verbalized understanding;Returned demonstration          PT Long Term Goals - 04/18/19 1211      PT  LONG TERM GOAL #1   Title  Pt will be I with HEP for Rt knee ROM and strength    Status  On-going      PT LONG TERM GOAL #2   Title  Pt will be able to bend Rt knee to 120 deg flexion for more normal transfers and ease of gait    Baseline  112 deg    Status  On-going      PT LONG TERM GOAL #3   Title  Pt will be able to demo 5/5 Rt knee strength for stability during ADLs, mobility    Status  On-going      PT LONG TERM GOAL #4   Title  Pt will be able to do light housework with pain < 3/10 most of the time with LRAD (cane vs walker)    Status  On-going            Plan - 04/18/19 1212    Clinical Impression Statement  Pt with increased backpain today, incorporated hip and trunk stretches into her HEP. She has increased her knee flexion ROM and she is no longer using her walker. She had difficulty stepping up with her Rt LE onto a 6 inch step. Tolerated brief closed chain once she is stretched out on the mat. Needs endurance work.    PT Treatment/Interventions  ADLs/Self Care Home Management;Cryotherapy;Electrical Stimulation;Therapeutic exercise;Balance training;Neuromuscular re-education;Stair training;Gait training;DME Instruction;Moist Heat;Functional mobility training;Therapeutic activities;Patient/family education;Manual techniques;Vasopneumatic Device;Taping;Passive range of motion    PT Next Visit Plan  standing as tolerated, knee, hip , core.  Nustep. try SLR able to do 10 reps today    PT Home Exercise Plan  mini squat, LAQ, knee flexion stretch, hip abd, also for back: LTR, knee to chest and hamstring stretch.    Consulted and Agree with Plan of Care  Patient       Patient will benefit from skilled therapeutic intervention in order to improve the following deficits and impairments:  Increased fascial restricitons, Pain, Decreased mobility, Postural dysfunction, Decreased strength, Decreased range of motion, Decreased activity tolerance, Difficulty walking, Increased edema,  Impaired flexibility, Obesity, Decreased knowledge of use of DME, Decreased scar mobility  Visit Diagnosis: Muscle weakness (generalized)  S/P revision of total knee, right  Difficulty in walking, not elsewhere classified  Stiffness of right knee, not elsewhere classified     Problem List Patient Active Problem List   Diagnosis Date Noted  . Pain in right knee 02/14/2019  . S/P revision of total knee, right 01/31/2019  . 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 Sleep apnea 01/31/2018  . Atrophic vaginitis 01/31/2018  . Hyponatremia 01/31/2018  . Increased body mass index 01/31/2018  . Osteoarthritis 01/31/2018  . Postmenopausal bleeding 01/31/2018  . Systemic lupus erythematosus ([place]) 01/31/2018  . Uterine leiomyoma 01/31/2018  . Trigger middle finger of left hand 01/11/2017  . 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 High risk medication use 01/06/2016  . 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 Vitamin D deficiency 01/06/2016  . Deep venous thrombosis of lower extremity ([place]) 06/04/2015  . Osteopenia 03/14/2012  . Diabetes mellitus type 2 in obese ([place]) 03/14/2012  . 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 Hypertension 03/14/2012    Ouida Abeyta 04/18/2019, 12:18 PM  [place], [place], [address] Phone: [phone]   Fax:  [phone]  Name: HAYDYN MACKINS MRN: [id number] Date of Birth: September 17, 1945  Raeford Razor, PT 04/18/19 12:18 PM Phone: [phone] Fax: [phone]

## 2019-04-19 ENCOUNTER — Encounter (INDEPENDENT_AMBULATORY_CARE_PROVIDER_SITE_OTHER): Payer: Medicare Other | Admitting: Ophthalmology

## 2019-04-19 DIAGNOSIS — H33302 Unspecified retinal break, left eye: Secondary | ICD-10-CM

## 2019-04-19 DIAGNOSIS — E113311 Type 2 diabetes mellitus with moderate nonproliferative diabetic retinopathy with macular edema, right eye: Secondary | ICD-10-CM

## 2019-04-19 DIAGNOSIS — H35033 Hypertensive retinopathy, bilateral: Secondary | ICD-10-CM | POA: Diagnosis not present

## 2019-04-19 DIAGNOSIS — E11311 Type 2 diabetes mellitus with unspecified diabetic retinopathy with macular edema: Secondary | ICD-10-CM

## 2019-04-19 DIAGNOSIS — H43813 Vitreous degeneration, bilateral: Secondary | ICD-10-CM

## 2019-04-19 DIAGNOSIS — E113392 Type 2 diabetes mellitus with moderate nonproliferative diabetic retinopathy without macular edema, left eye: Secondary | ICD-10-CM | POA: Diagnosis not present

## 2019-04-19 DIAGNOSIS — H35372 Puckering of macula, left eye: Secondary | ICD-10-CM

## 2019-04-19 DIAGNOSIS — I10 Essential (primary) hypertension: Secondary | ICD-10-CM | POA: Diagnosis not present

## 2019-04-20 ENCOUNTER — Encounter: Payer: Self-pay | Admitting: Physical Therapy

## 2019-04-20 ENCOUNTER — Other Ambulatory Visit: Payer: Self-pay

## 2019-04-20 ENCOUNTER — Ambulatory Visit: Payer: Medicare Other | Admitting: Physical Therapy

## 2019-04-20 DIAGNOSIS — R262 Difficulty in walking, not elsewhere classified: Secondary | ICD-10-CM

## 2019-04-20 DIAGNOSIS — M6281 Muscle weakness (generalized): Secondary | ICD-10-CM

## 2019-04-20 DIAGNOSIS — Z96651 Presence of right artificial knee joint: Secondary | ICD-10-CM | POA: Diagnosis not present

## 2019-04-20 DIAGNOSIS — M25661 Stiffness of right knee, not elsewhere classified: Secondary | ICD-10-CM | POA: Diagnosis not present

## 2019-04-20 NOTE — Therapy (Signed)
[place], [place], [address] Phone: [phone]   Fax:  [phone]  Physical Therapy Treatment  Patient Details  Name: Kaitlyn Jackson MRN: [id number] Date of Birth: 06-Sep-1945 Referring Provider (PT): Dr.Peter Durward Fortes   Encounter Date: 04/20/2019  PT End of Session - 04/20/19 1027    Visit Number  6    Number of Visits  12    Date for PT Re-Evaluation  05/04/19    Authorization Type  UHC MCR MCD    PT Start Time  [id number]    PT Stop Time  1103    PT Time Calculation (min)  48 min       Past Medical History:  Diagnosis Date  . Clotting disorder ([place])   . Cough 02/08/2017  . Diabetic retinopathy ([place])   . 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 Full dentures   . GERD (gastroesophageal reflux disease)   . Heart murmur   . History of blood clots 1996   groin  . History of glaucoma    had laser correction  . Hypertension    states under control with meds., has been on med. since 1996  . Insulin dependent diabetes mellitus   . Mitral valve prolapse   . Osteoarthritis    bilateral knee  . Seasonal allergies   . Sleep apnea    no CPAP use in several months, per pt.  . Trigger finger, left middle finger 01/2017    Past Surgical History:  Procedure Laterality Date  . BACK SURGERY     lower - removed  cysts  . CATARACT EXTRACTION W/ INTRAOCULAR LENS  IMPLANT, BILATERAL Bilateral   . GLAUCOMA SURGERY Bilateral    laser  . HEMILAMINOTOMY LUMBAR SPINE Right 01/30/2009   L5  . JOINT REPLACEMENT    . TOTAL KNEE ARTHROPLASTY Right 09/22/2007  . TOTAL KNEE ARTHROPLASTY Left 02/01/2007  . TOTAL KNEE REVISION Right 01/31/2019   Procedure: RIGHT TOTAL KNEE REVISION;  Surgeon: [name], MD;  [facility];  Service: Orthopedics;  Laterality: Right;  . TRIGGER FINGER RELEASE Right 12/14/2013   Procedure: RELEASE TRIGGER FINGER/A-1 PULLEY RIGHT RING FINGER;  Surgeon: Daryll Brod, MD;  Location: [place];  Service: Orthopedics;  Laterality: Right;  . TRIGGER FINGER RELEASE Left 02/11/2017   Procedure: RELEASE TRIGGER FINGER/A-1 PULLEY;  Surgeon: Leanora Cover, MD;  Location: [place];  Service: Orthopedics;  Laterality: Left;    There were no vitals filed for this visit.  Subjective Assessment - 04/20/19 1044    Subjective  Knee Just stiff and sore in the knee. Back was moderate earlier but I took pain meds. I always hurt after therapy.    Currently in Pain?  No/denies                       [facility] Adult PT Treatment/Exercise - 04/20/19 0001      Knee/Hip Exercises: Aerobic   Nustep  8 min L5 Ue and LE       Knee/Hip Exercises: Standing   Hip Flexion  Stengthening;Right;Left;1 set;15 reps    Functional Squat  10 reps    Functional Squat Limitations  cues for hip hinge      Knee/Hip Exercises: Seated   Long Arc Quad  1 set;20 reps    Long Arc Quad Weight  5 lbs.    Marching  Right;10 reps  Marching Weights  5 lbs.    Hamstring Curl  10 reps    Hamstring Limitations  2 sets red band     Sit to [employer]  10 reps      Knee/Hip Exercises: Supine   Straight Leg Raises  10 reps      Moist Heat Therapy   Number Minutes Moist Heat  10 Minutes    Moist Heat Location  Knee                  PT Long Term Goals - 04/18/19 1211      PT LONG TERM GOAL #1   Title  Pt will be I with HEP for Rt knee ROM and strength    Status  On-going      PT LONG TERM GOAL #2   Title  Pt will be able to bend Rt knee to 120 deg flexion for more normal transfers and ease of gait    Baseline  112 deg    Status  On-going      PT LONG TERM GOAL #3   Title  Pt will be able to demo 5/5 Rt knee strength for stability during ADLs, mobility    Status  On-going      PT LONG TERM GOAL #4   Title  Pt will be able to do light housework with pain < 3/10 most of the time with LRAD (cane vs walker)    Status  On-going             Plan - 04/20/19 1201    Clinical Impression Statement  Pt arrives fatigued from sleeping in hotel (power was out). She has questions about the end of POC. She demonstrates functional weakness and decreased endurance. Continued with open and closed chain strengthening with rest breaks as needed.    PT Next Visit Plan  decide if she should schedule an ERO after next week ( she is scheduled with PTA)    PT Home Exercise Plan  mini squat, LAQ, knee flexion stretch, hip abd, also for back: LTR, knee to chest and hamstring stretch.       Patient will benefit from skilled therapeutic intervention in order to improve the following deficits and impairments:  Increased fascial restricitons, Pain, Decreased mobility, Postural dysfunction, Decreased strength, Decreased range of motion, Decreased activity tolerance, Difficulty walking, Increased edema, Impaired flexibility, Obesity, Decreased knowledge of use of DME, Decreased scar mobility  Visit Diagnosis: Muscle weakness (generalized)  S/P revision of total knee, right  Difficulty in walking, not elsewhere classified  Stiffness of right knee, not elsewhere classified     Problem List Patient Active Problem List   Diagnosis Date Noted  . Pain in right knee 02/14/2019  . S/P revision of total knee, right 01/31/2019  . 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 Sleep apnea 01/31/2018  . Atrophic vaginitis 01/31/2018  . Hyponatremia 01/31/2018  . Increased body mass index 01/31/2018  . Osteoarthritis 01/31/2018  . Postmenopausal bleeding 01/31/2018  . Systemic lupus erythematosus ([place]) 01/31/2018  . Uterine leiomyoma 01/31/2018  . Trigger middle finger of left hand 01/11/2017  . 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 High risk medication use 01/06/2016  . 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 Vitamin D deficiency  01/06/2016  . Deep venous thrombosis of lower extremity ([place]) 06/04/2015  . Osteopenia 03/14/2012  . Diabetes mellitus type 2 in obese ([place])  03/14/2012  . 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 Hypertension 03/14/2012    Dorene Ar, PTA 04/20/2019, 12:16 PM  [facility] [address] [place], [place], [address] Phone: [phone]   Fax:  [phone]  Name: TUNISHA MINICOZZI MRN: [id number] Date of Birth: 1945-12-08

## 2019-04-25 ENCOUNTER — Ambulatory Visit: Payer: Medicare Other | Admitting: Physical Therapy

## 2019-04-25 ENCOUNTER — Encounter: Payer: Self-pay | Admitting: Physical Therapy

## 2019-04-25 ENCOUNTER — Other Ambulatory Visit: Payer: Self-pay

## 2019-04-25 DIAGNOSIS — Z96651 Presence of right artificial knee joint: Secondary | ICD-10-CM

## 2019-04-25 DIAGNOSIS — R262 Difficulty in walking, not elsewhere classified: Secondary | ICD-10-CM

## 2019-04-25 DIAGNOSIS — M6281 Muscle weakness (generalized): Secondary | ICD-10-CM | POA: Diagnosis not present

## 2019-04-25 DIAGNOSIS — M25661 Stiffness of right knee, not elsewhere classified: Secondary | ICD-10-CM

## 2019-04-25 NOTE — Patient Instructions (Addendum)
Quad Set    With other leg bent, foot flat, slowly tighten muscles on thigh of straight leg while counting out loud to ____. Repeat with other leg. Repeat ____ times. Do ____ sessions per day.  http://gt2.exer.us/276   Copyright  VHI. All rights reserved.     Hip Flexion / Knee Extension: Straight-Leg Raise (Eccentric)    Lie on back. Lift leg with knee straight. Slowly lower leg for 3-5 seconds. _ Copyright  VHI. All rights reserved.

## 2019-04-25 NOTE — Therapy (Signed)
[place], [place], [address] Phone: [phone]   Fax:  [phone]  Physical Therapy Treatment/Discharge   Patient Details  Name: Kaitlyn Jackson MRN: [id number] Date of Birth: 1946-01-16 Referring Provider (PT): Dr.Peter Durward Fortes   Encounter Date: 04/25/2019  PT End of Session - 04/25/19 1102    Visit Number  7    Number of Visits  12    Date for PT Re-Evaluation  05/04/19    Authorization Type  [employer] MCR MCD    PT Start Time  1053    PT Stop Time  1131    PT Time Calculation (min)  38 min    Activity Tolerance  Patient tolerated treatment well    Behavior During Therapy  [facility] for tasks assessed/performed       Past Medical History:  Diagnosis Date  . Clotting disorder ([place])   . Cough 02/08/2017  . Diabetic retinopathy ([place])   . 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 Full dentures   . GERD (gastroesophageal reflux disease)   . Heart murmur   . History of blood clots 1996   groin  . History of glaucoma    had laser correction  . Hypertension    states under control with meds., has been on med. since 1996  . Insulin dependent diabetes mellitus   . Mitral valve prolapse   . Osteoarthritis    bilateral knee  . Seasonal allergies   . Sleep apnea    no CPAP use in several months, per pt.  . Trigger finger, left middle finger 01/2017    Past Surgical History:  Procedure Laterality Date  . BACK SURGERY     lower - removed  cysts  . CATARACT EXTRACTION W/ INTRAOCULAR LENS  IMPLANT, BILATERAL Bilateral   . GLAUCOMA SURGERY Bilateral    laser  . HEMILAMINOTOMY LUMBAR SPINE Right 01/30/2009   L5  . JOINT REPLACEMENT    . TOTAL KNEE ARTHROPLASTY Right 09/22/2007  . TOTAL KNEE ARTHROPLASTY Left 02/01/2007  . TOTAL KNEE REVISION Right 01/31/2019   Procedure: RIGHT TOTAL KNEE REVISION;  Surgeon: [name], MD;  Location: [facility];  Service: Orthopedics;  Laterality: Right;   . TRIGGER FINGER RELEASE Right 12/14/2013   Procedure: RELEASE TRIGGER FINGER/A-1 PULLEY RIGHT RING FINGER;  Surgeon: Daryll Brod, MD;  Location: [place];  Service: Orthopedics;  Laterality: Right;  . TRIGGER FINGER RELEASE Left 02/11/2017   Procedure: RELEASE TRIGGER FINGER/A-1 PULLEY;  Surgeon: Leanora Cover, MD;  Location: [place];  Service: Orthopedics;  Laterality: Left;    There were no vitals filed for this visit.  Subjective Assessment - 04/25/19 1056    Subjective  Knee is sore but has been sore for the past few days. No back pain today.    Currently in Pain?  Yes    Pain Score  2     Pain Location  Knee    Pain Orientation  Right    Pain Descriptors / Indicators  Sore    Pain Type  Chronic pain;Surgical pain    Pain Onset  More than a month ago    Pain Frequency  Intermittent         [facility] Adult PT Treatment/Exercise - 04/25/19 0001      Knee/Hip Exercises: Stretches   Active Hamstring Stretch  Right;3 reps;30 seconds    Knee: Self-Stretch to increase Flexion  Right;5 reps  Knee: Self-Stretch Limitations  uses strap at home       Knee/Hip Exercises: Aerobic   Nustep  8 min L5 Ue and LE       Knee/Hip Exercises: Standing   Heel Raises  Both;1 set;15 reps    Hip Flexion  Stengthening;Right;Left;1 set;15 reps    Hip Abduction  Stengthening;Both;1 set;10 reps;Knee straight    Functional Squat  10 reps    Functional Squat Limitations  cues for hip hinge      Knee/Hip Exercises: Seated   Long Arc Quad  Strengthening;Right;2 sets    Long Arc Quad Limitations  x 10 with green band    Hamstring Curl  10 reps    Hamstring Limitations  2 sets green band     Sit to [employer]  10 reps      Knee/Hip Exercises: Supine   Straight Leg Raises  10 reps             PT Education - 04/25/19 1227    Education Details  POC, DC vs continuing and her decision, full HEP including standing    Person(s) Educated  Patient    Methods   Explanation;Demonstration;Handout;Verbal cues    Comprehension  Verbalized understanding;Returned demonstration          PT Long Term Goals - 04/25/19 1119      PT LONG TERM GOAL #1   Title  Pt will be I with HEP for Rt knee ROM and strength    Status  Achieved      PT LONG TERM GOAL #2   Title  Pt will be able to bend Rt knee to 120 deg flexion for more normal transfers and ease of gait    Baseline  112    Status  Not Met      PT LONG TERM GOAL #3   Title  Pt will be able to demo 5/5 Rt knee strength for stability during ADLs, mobility    Baseline  Rt quads 4/5 and hamstring 5/5    Status  On-going      PT LONG TERM GOAL #4   Title  Pt will be able to do light housework with pain < 3/10 most of the time with LRAD (cane vs walker)    Baseline  does not have to do that much, husband has a CNA who helps    Status  Achieved            Plan - 04/25/19 1101    Clinical Impression Statement  Patient would like to finish today as she is pleased with her current status.  Her ROM is 112 deg in flexion and she has 4/5 quad strength.  I urged her to use a cane in the community and continue to do her HEP daily.  See s MD end of April.    Examination-Activity Limitations  Lift;Stand;Locomotion Level;Bed Mobility;Transfers;Squat;Hygiene/Grooming;Stairs;Dressing;Sit;Bend    PT Treatment/Interventions  ADLs/Self Care Home Management;Cryotherapy;Electrical Stimulation;Therapeutic exercise;Balance training;Neuromuscular re-education;Stair training;Gait training;DME Instruction;Moist Heat;Functional mobility training;Therapeutic activities;Patient/family education;Manual techniques;Vasopneumatic Device;Taping;Passive range of motion    PT Next Visit Plan  NA , DC    PT Home Exercise Plan  mini squat, LAQ, knee flexion stretch, hip abd, also for back: LTR, knee to chest and hamstring stretch, quad set, SLR    Consulted and Agree with Plan of Care  Patient       Patient will benefit from  skilled therapeutic intervention in order to improve the following deficits and impairments:  Increased  fascial restricitons, Pain, Decreased mobility, Postural dysfunction, Decreased strength, Decreased range of motion, Decreased activity tolerance, Difficulty walking, Increased edema, Impaired flexibility, Obesity, Decreased knowledge of use of DME, Decreased scar mobility  Visit Diagnosis: Muscle weakness (generalized)  S/P revision of total knee, right  Difficulty in walking, not elsewhere classified  Stiffness of right knee, not elsewhere classified     Problem List Patient Active Problem List   Diagnosis Date Noted  . Pain in right knee 02/14/2019  . S/P revision of total knee, right 01/31/2019  . 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 Sleep apnea 01/31/2018  . Atrophic vaginitis 01/31/2018  . Hyponatremia 01/31/2018  . Increased body mass index 01/31/2018  . Osteoarthritis 01/31/2018  . Postmenopausal bleeding 01/31/2018  . Systemic lupus erythematosus ([place]) 01/31/2018  . Uterine leiomyoma 01/31/2018  . Trigger middle finger of left hand 01/11/2017  . 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 High risk medication use 01/06/2016  . 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 Vitamin D deficiency 01/06/2016  . Deep venous thrombosis of lower extremity ([place]) 06/04/2015  . Osteopenia 03/14/2012  . Diabetes mellitus type 2 in obese ([place]) 03/14/2012  . 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 Hypertension 03/14/2012    Kaitlyn Jackson 04/25/2019, 12:33 PM  [facility] [address] [place], [place], [address] Phone: [phone]   Fax:  [phone]  Name: Kaitlyn Jackson MRN: [id number] Date of Birth: [birth year]   Raeford Razor, PT 04/25/19 12:34 PM Phone: [phone] Fax:  [phone]  PHYSICAL THERAPY DISCHARGE SUMMARY  Visits from Start of Care: 7  Current functional level related to goals / functional outcomes: See above.  Pain in R knee varies, depending on activities.  Swelling is stable. Occasional back pain.  Walks with and without cane (in home) with good safety awareness. She has some increased pain after PT.  She is also concerned about leaving her husband due to his chronic conditions and new depression.    Remaining deficits: AROM, knee strength, hip strength    Education / Equipment: HEP, gait , posture , ROM and strength Plan: Patient agrees to discharge.  Patient goals were partially met. Patient is being discharged due to being pleased with the current functional level.  ?????    Raeford Razor, PT 04/25/19 12:38 PM Phone: [phone] Fax: [phone]

## 2019-04-27 DIAGNOSIS — E1169 Type 2 diabetes mellitus with other specified complication: Secondary | ICD-10-CM | POA: Diagnosis not present

## 2019-04-27 DIAGNOSIS — M130 Polyarthritis, unspecified: Secondary | ICD-10-CM | POA: Diagnosis not present

## 2019-04-27 DIAGNOSIS — E039 Hypothyroidism, unspecified: Secondary | ICD-10-CM | POA: Diagnosis not present

## 2019-04-27 DIAGNOSIS — I10 Essential (primary) hypertension: Secondary | ICD-10-CM | POA: Diagnosis not present

## 2019-05-02 ENCOUNTER — Ambulatory Visit: Payer: Medicare Other | Admitting: Physical Therapy

## 2019-05-04 ENCOUNTER — Ambulatory Visit: Payer: Medicare Other | Admitting: Physical Therapy

## 2019-05-15 ENCOUNTER — Ambulatory Visit: Payer: Medicare Other | Admitting: Podiatry

## 2019-05-24 ENCOUNTER — Other Ambulatory Visit: Payer: Self-pay

## 2019-05-24 ENCOUNTER — Encounter (INDEPENDENT_AMBULATORY_CARE_PROVIDER_SITE_OTHER): Payer: Medicare Other | Admitting: Ophthalmology

## 2019-05-24 DIAGNOSIS — H35372 Puckering of macula, left eye: Secondary | ICD-10-CM

## 2019-05-24 DIAGNOSIS — E113313 Type 2 diabetes mellitus with moderate nonproliferative diabetic retinopathy with macular edema, bilateral: Secondary | ICD-10-CM | POA: Diagnosis not present

## 2019-05-24 DIAGNOSIS — H35033 Hypertensive retinopathy, bilateral: Secondary | ICD-10-CM

## 2019-05-24 DIAGNOSIS — I10 Essential (primary) hypertension: Secondary | ICD-10-CM

## 2019-05-24 DIAGNOSIS — E11311 Type 2 diabetes mellitus with unspecified diabetic retinopathy with macular edema: Secondary | ICD-10-CM

## 2019-05-24 DIAGNOSIS — H33302 Unspecified retinal break, left eye: Secondary | ICD-10-CM

## 2019-05-24 DIAGNOSIS — H43813 Vitreous degeneration, bilateral: Secondary | ICD-10-CM

## 2019-05-26 DIAGNOSIS — E1169 Type 2 diabetes mellitus with other specified complication: Secondary | ICD-10-CM | POA: Diagnosis not present

## 2019-05-26 DIAGNOSIS — R791 Abnormal coagulation profile: Secondary | ICD-10-CM | POA: Diagnosis not present

## 2019-05-26 DIAGNOSIS — I10 Essential (primary) hypertension: Secondary | ICD-10-CM | POA: Diagnosis not present

## 2019-05-26 DIAGNOSIS — R7309 Other abnormal glucose: Secondary | ICD-10-CM | POA: Diagnosis not present

## 2019-05-26 DIAGNOSIS — E039 Hypothyroidism, unspecified: Secondary | ICD-10-CM | POA: Diagnosis not present

## 2019-05-26 DIAGNOSIS — R799 Abnormal finding of blood chemistry, unspecified: Secondary | ICD-10-CM | POA: Diagnosis not present

## 2019-05-31 ENCOUNTER — Ambulatory Visit (INDEPENDENT_AMBULATORY_CARE_PROVIDER_SITE_OTHER): Payer: Medicare Other | Admitting: Podiatry

## 2019-05-31 ENCOUNTER — Other Ambulatory Visit: Payer: Self-pay

## 2019-05-31 DIAGNOSIS — B351 Tinea unguium: Secondary | ICD-10-CM | POA: Diagnosis not present

## 2019-05-31 DIAGNOSIS — E0843 Diabetes mellitus due to underlying condition with diabetic autonomic (poly)neuropathy: Secondary | ICD-10-CM | POA: Diagnosis not present

## 2019-05-31 DIAGNOSIS — L989 Disorder of the skin and subcutaneous tissue, unspecified: Secondary | ICD-10-CM | POA: Diagnosis not present

## 2019-05-31 DIAGNOSIS — M79676 Pain in unspecified toe(s): Secondary | ICD-10-CM | POA: Diagnosis not present

## 2019-06-01 DIAGNOSIS — H401232 Low-tension glaucoma, bilateral, moderate stage: Secondary | ICD-10-CM | POA: Diagnosis not present

## 2019-06-04 NOTE — Progress Notes (Signed)
   SUBJECTIVE °Patient presents to office today complaining of elongated, thickened nails that cause pain while ambulating in shoes. She is unable to trim her own nails. °She also notes painful callus lesions of the bilateral feet that have been present for the past few months. Walking and bearing weight increases the pain. She has not had any treatment at home for the symptoms. Patient is here for further evaluation and treatment. ° °Past Medical History:  °Diagnosis Date  °• Clotting disorder ([facility])   °• Cough 02/08/2017  °• Diabetic retinopathy ([facility])   °• Discoid lupus   ° states currently in remission  °• Full dentures   °• GERD (gastroesophageal reflux disease)   °• Heart murmur   °• History of blood clots 1996  ° groin  °• History of glaucoma   ° had laser correction  °• Hypertension   ° states under control with meds., has been on med. since 1996  °• Insulin dependent diabetes mellitus   °• Mitral valve prolapse   °• Osteoarthritis   ° bilateral knee  °• Seasonal allergies   °• Sleep apnea   ° no CPAP use in several months, per pt.  °• Trigger finger, left middle finger 01/2017  ° ° °OBJECTIVE °General Patient is awake, alert, and oriented x 3 and in no acute distress. °Derm Skin is dry and supple bilateral. Negative open lesions or macerations. Remaining integument unremarkable. Nails are tender, long, thickened and dystrophic with subungual debris, consistent with onychomycosis, 1-5 bilateral. No signs of infection noted. Hyperkeratotic lesion(s) present on the bilateral feet. Pain on palpation with a central nucleated core noted.  °Vasc  DP and PT pedal pulses palpable bilaterally. Temperature gradient within normal limits.  °Neuro Epicritic and protective threshold sensation grossly intact bilaterally.  °Musculoskeletal Exam No symptomatic pedal deformities noted bilateral. Muscular strength within normal limits. ° °ASSESSMENT °1. Onychodystrophic nails 1-5 bilateral with hyperkeratosis of nails.  °2.  Onychomycosis of nail due to dermatophyte bilateral °3. Pre-ulcerative callus lesions noted to the bilateral feet x 2  ° °PLAN OF CARE °1. Patient evaluated today.  °2. Instructed to maintain good pedal hygiene and foot care.  °3. Mechanical debridement of nails 1-5 bilaterally performed using a nail nipper. Filed with dremel without incident.  °4. Excisional debridement of keratotic lesion(s) using a chisel blade was performed without incident. Light dressing applied.  °5. Return to clinic in 3 mos.  ° ° °Trashaun Streight M. Aleaha Capobianco, DPM °[facility] ° °Dr. Reality Dejonge M. Jazline Cumbee, DPM  °  °[address]                                        °[place], [place] [address]                °Office [phone]  °Fax [phone] ° ° ° ° °

## 2019-06-14 DIAGNOSIS — E1165 Type 2 diabetes mellitus with hyperglycemia: Secondary | ICD-10-CM | POA: Diagnosis not present

## 2019-06-14 DIAGNOSIS — I270 Primary pulmonary hypertension: Secondary | ICD-10-CM | POA: Diagnosis not present

## 2019-06-14 DIAGNOSIS — I82509 Chronic embolism and thrombosis of unspecified deep veins of unspecified lower extremity: Secondary | ICD-10-CM | POA: Diagnosis not present

## 2019-06-14 DIAGNOSIS — Z7901 Long term (current) use of anticoagulants: Secondary | ICD-10-CM | POA: Diagnosis not present

## 2019-06-20 DIAGNOSIS — Z1231 Encounter for screening mammogram for malignant neoplasm of breast: Secondary | ICD-10-CM | POA: Diagnosis not present

## 2019-06-26 DIAGNOSIS — E1169 Type 2 diabetes mellitus with other specified complication: Secondary | ICD-10-CM | POA: Diagnosis not present

## 2019-06-26 DIAGNOSIS — I10 Essential (primary) hypertension: Secondary | ICD-10-CM | POA: Diagnosis not present

## 2019-06-27 DIAGNOSIS — G4733 Obstructive sleep apnea (adult) (pediatric): Secondary | ICD-10-CM | POA: Diagnosis not present

## 2019-06-29 ENCOUNTER — Encounter (INDEPENDENT_AMBULATORY_CARE_PROVIDER_SITE_OTHER): Payer: Medicare Other | Admitting: Ophthalmology

## 2019-07-04 ENCOUNTER — Other Ambulatory Visit: Payer: Self-pay

## 2019-07-04 ENCOUNTER — Ambulatory Visit (INDEPENDENT_AMBULATORY_CARE_PROVIDER_SITE_OTHER): Payer: Medicare Other | Admitting: Orthopaedic Surgery

## 2019-07-04 ENCOUNTER — Encounter: Payer: Self-pay | Admitting: Orthopaedic Surgery

## 2019-07-04 DIAGNOSIS — Z96651 Presence of right artificial knee joint: Secondary | ICD-10-CM

## 2019-07-04 NOTE — Progress Notes (Signed)
Office Visit Note   [name]           Date of Birth: 25-Jul-1945           MRN: [id number] Visit Date: 07/04/2019              Requested by: Lucianne Lei, [address] [place],  [place] [address] PCP: Lucianne Lei, MD   Assessment & Plan: Visit Diagnoses:  1. S/P revision of total knee, right     Plan:  #1:  Continue her home exercise program. #2: Call in the interim if she has problems otherwise follow back up in 6 months.  Follow-Up Instructions: Return in about 6 months (around 01/03/2020) for follow up.   Orders:  No orders of the defined types were placed in this encounter.  No orders of the defined types were placed in this encounter.     Procedures: No procedures performed   Clinical Data: No additional findings.   Subjective: Chief Complaint  Patient presents with  . Right Knee - Follow-up    Right TKA revision DOS 01/31/2019   HPI: Patient presents today for follow up on her right knee. She had a right total knee arthroplasty revision on 01/31/2019. She is now 5 months out from surgery. Patient states that she is doing well. She has been caring for her husband at home and has not really thought much about her knee. She does state that it bothers her with prolonged weightbearing. She takes Tylenol if needed.  Overall she states she is doing well and doing her activities of daily living.  Her pain is that she has is very tolerable.   Review of Systems  Constitutional: Negative for fatigue.  Eyes: Negative for pain.  Respiratory: Negative for shortness of breath.   Cardiovascular: Negative for leg swelling.  Gastrointestinal: Negative for constipation and diarrhea.  Endocrine: Negative for cold intolerance and heat intolerance.  Genitourinary: Negative for difficulty urinating.  Musculoskeletal: Negative for joint swelling.  Skin: Negative for rash.  Allergic/Immunologic: Negative for food allergies.  Neurological: Negative  for weakness.  Hematological: Does not bruise/bleed easily.  Psychiatric/Behavioral: Negative for sleep disturbance.     Objective: Vital Signs: There were no vitals taken for this visit.  Physical Exam Constitutional:      Appearance: Normal appearance. She is well-developed. She is obese.  HENT:     Head: Normocephalic.  Eyes:     Pupils: Pupils are equal, round, and reactive to light.  Pulmonary:     Effort: Pulmonary effort is normal.  Skin:    General: Skin is warm and dry.  Neurological:     Mental Status: She is alert and oriented to person, place, and time.  Psychiatric:        Behavior: Behavior normal.     Ortho Exam  Exam today reveals the wound to be healing per primam.  She has no warmth or erythema.  Smooth motion.  Ligamentously stable.  Minimal effusion if any.  No calf pain.  Specialty Comments:  No specialty comments available.  Imaging: No results found.   PMFS History: Current Outpatient Medications  Medication Sig Dispense Refill  . ACCU-CHEK AVIVA PLUS test strip CHECK BLOOD GLUCOSE TWICE A DAY  4  . acetaminophen (TYLENOL) 325 MG tablet Take 2 tablets (650 mg total) by mouth every 6 (six) hours as needed for moderate pain.    Marland Kitchen atorvastatin (LIPITOR) 20 MG tablet Take 10 mg by mouth  at bedtime.    . benazepril (LOTENSIN) 40 MG tablet Take 40 mg by mouth daily.     Marland Kitchen BESIVANCE 0.6 % SUSP Place 1 drop into the right eye See admin instructions. Instill one drop into right eye 4 times daily for 2 days after each monthly eye injection.    . Blood Glucose Monitoring Suppl (GLUCOCOM BLOOD GLUCOSE MONITOR) DEVI Accu-Chek Aviva Plus Meter    . Calcitriol-Fluticas-Tarcrolim (TRIDERMA FORTE EX) Apply 1 application topically 3 (three) times daily as needed (for pain).    . cholecalciferol (VITAMIN D) 1000 UNITS tablet Take 1,000 Units by mouth at bedtime.     . enoxaparin (LOVENOX) 30 MG/0.3ML injection Inject 0.3 mLs (30 mg total) into the skin daily. 0.6  mL 0  . esomeprazole (NEXIUM) 40 MG capsule Take 40 mg by mouth daily before breakfast.    . fluticasone (FLONASE) 50 MCG/ACT nasal spray Place 1 spray into both nostrils daily as needed for allergies.     . folic acid (FOLVITE) 1 MG tablet Take 1 mg by mouth every evening.     . furosemide (LASIX) 40 MG tablet Take 40 mg by mouth daily.    Marland Kitchen HUMALOG MIX 75/25 KWIKPEN (75-25) 100 UNIT/ML Kwikpen Inject 50 Units into the skin daily.     . Insulin Pen Needle (FIFTY50 PEN NEEDLES) 31G X 8 MM MISC BD Ultra-Fine Short Pen Needle 31 gauge x 5/16"    . Insulin Pen Needle (NOVOFINE PLUS) 32G X 4 MM MISC NovoFine Plus 32 gauge x 1/6" needle  USE 2 TIMES DAILY    . Insulin Pen Needle (NOVOFINE) 30G X 8 MM MISC NovoFine 30 30 gauge x 1/3" needle    . Lancet Devices ([employer] LANCING DEVICE) MISC Accu-Chek FastClix Lancing Device    . metFORMIN (GLUCOPHAGE) 1000 MG tablet Take 1,000 mg by mouth at bedtime.  3  . methocarbamol (ROBAXIN) 500 MG tablet Take 1 tablet (500 mg total) by mouth every 8 (eight) hours as needed for muscle spasms. 20 tablet 0  . methocarbamol (ROBAXIN) 500 MG tablet 1 PO q 8hr prn 20 tablet 0  . montelukast (SINGULAIR) 10 MG tablet Take 10 mg by mouth at bedtime.    Marland Kitchen oxyCODONE (OXY IR/ROXICODONE) 5 MG immediate release tablet Take 1-2 tablets (5-10 mg total) by mouth every 4 (four) hours as needed for moderate pain or severe pain (pain score 4-6). 60 tablet 0  . oxycodone (OXY-IR) 5 MG capsule Take 1 capsule (5 mg total) by mouth every 4 (four) hours as needed. 30 capsule 0  . oxycodone (OXY-IR) 5 MG capsule Take 2 capsules (10 mg total) by mouth every 6 (six) hours as needed for pain. 30 capsule 0  . oxycodone (OXY-IR) 5 MG capsule Take 1 capsule (5 mg total) by mouth every 6 (six) hours as needed. 30 capsule 0  . oxycodone (OXY-IR) 5 MG capsule Take 1 capsule (5 mg total) by mouth 3 (three) times daily as needed. 30 capsule 0  . pregabalin (LYRICA) 100 MG capsule Take 100 mg by mouth 2  (two) times daily.    . sitaGLIPtin-metformin (JANUMET) 50-1000 MG tablet Take 1 tablet by mouth every morning.     . verapamil (CALAN-SR) 180 MG CR tablet Take 180 mg by mouth 2 (two) times daily.    . vitamin C (ASCORBIC ACID) 500 MG tablet Take 500 mg by mouth daily.     Marland Kitchen warfarin (COUMADIN) 10 MG tablet Take 10 mg by mouth  See admin instructions. Take 1 tablet by mouth every other day in the evening, alternating with 7.5mg  tablet  3  . warfarin (COUMADIN) 7.5 MG tablet Take 7.5 mg by mouth See admin instructions. Take 1 tablet by mouth every other day in the evening, alternating with 10mg  tablet  3   No current facility-administered medications for this visit.    Patient Active Problem List   Diagnosis Date Noted  . Pain in right knee 02/14/2019  . S/P revision of total knee, right 01/31/2019  . 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 Sleep apnea 01/31/2018  . Atrophic vaginitis 01/31/2018  . Hyponatremia 01/31/2018  . Increased body mass index 01/31/2018  . Osteoarthritis 01/31/2018  . Postmenopausal bleeding 01/31/2018  . Systemic lupus erythematosus ([place]) 01/31/2018  . Uterine leiomyoma 01/31/2018  . Trigger middle finger of left hand 01/11/2017  . 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 High risk medication use 01/06/2016  . 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 Vitamin D deficiency 01/06/2016  . Deep venous thrombosis of lower extremity ([place]) 06/04/2015  . Osteopenia 03/14/2012  . Diabetes mellitus type 2 in obese ([place]) 03/14/2012  . 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 Hypertension 03/14/2012   Past Medical History:  Diagnosis Date  . Clotting disorder ([place])   . Cough 02/08/2017  . Diabetic retinopathy ([place])   . 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 Full dentures   . GERD (gastroesophageal reflux disease)   . Heart  murmur   . History of blood clots 1996   groin  . History of glaucoma    had laser correction  . Hypertension    states under control with meds., has been on med. since 1996  . Insulin dependent diabetes mellitus   . Mitral valve prolapse   . Osteoarthritis    bilateral knee  . Seasonal allergies   . Sleep apnea    no CPAP use in several months, per pt.  . Trigger finger, left middle finger 01/2017    Family History  Problem Relation Age of Onset  . Cancer Mother        colon  . Cancer Cousin        lung    Past Surgical History:  Procedure Laterality Date  . BACK SURGERY     lower - removed  cysts  . CATARACT EXTRACTION W/ INTRAOCULAR LENS  IMPLANT, BILATERAL Bilateral   . GLAUCOMA SURGERY Bilateral    laser  . HEMILAMINOTOMY LUMBAR SPINE Right 01/30/2009   L5  . JOINT REPLACEMENT    . TOTAL KNEE ARTHROPLASTY Right 09/22/2007  . TOTAL KNEE ARTHROPLASTY Left 02/01/2007  . TOTAL KNEE REVISION Right 01/31/2019   Procedure: RIGHT TOTAL KNEE REVISION;  Surgeon: [name], MD;  Location[facility];  Service: Orthopedics;  Laterality: Right;  . TRIGGER FINGER RELEASE Right 12/14/2013   Procedure: RELEASE TRIGGER FINGER/A-1 PULLEY RIGHT RING FINGER;  Surgeon: Daryll Brod, MD;  Location: [place];  Service: Orthopedics;  Laterality: Right;  . TRIGGER FINGER RELEASE Left 02/11/2017   Procedure: RELEASE TRIGGER FINGER/A-1 PULLEY;  Surgeon: Leanora Cover, MD;  Location: [place];  Service: Orthopedics;  Laterality: Left;   Social History   Occupational History  . Not on file  Tobacco Use  . Smoking status: Never Smoker  . Smokeless tobacco: Never Used  Substance and Sexual Activity  .  Alcohol use: No  . Drug use: No  . Sexual activity: Not on file

## 2019-07-14 DIAGNOSIS — E1165 Type 2 diabetes mellitus with hyperglycemia: Secondary | ICD-10-CM | POA: Diagnosis not present

## 2019-07-14 DIAGNOSIS — Z7901 Long term (current) use of anticoagulants: Secondary | ICD-10-CM | POA: Diagnosis not present

## 2019-07-14 DIAGNOSIS — I82509 Chronic embolism and thrombosis of unspecified deep veins of unspecified lower extremity: Secondary | ICD-10-CM | POA: Diagnosis not present

## 2019-07-14 DIAGNOSIS — I270 Primary pulmonary hypertension: Secondary | ICD-10-CM | POA: Diagnosis not present

## 2019-07-19 ENCOUNTER — Encounter (INDEPENDENT_AMBULATORY_CARE_PROVIDER_SITE_OTHER): Payer: Medicare Other | Admitting: Ophthalmology

## 2019-07-19 ENCOUNTER — Other Ambulatory Visit: Payer: Self-pay

## 2019-07-19 DIAGNOSIS — I10 Essential (primary) hypertension: Secondary | ICD-10-CM | POA: Diagnosis not present

## 2019-07-19 DIAGNOSIS — H33302 Unspecified retinal break, left eye: Secondary | ICD-10-CM | POA: Diagnosis not present

## 2019-07-19 DIAGNOSIS — E113313 Type 2 diabetes mellitus with moderate nonproliferative diabetic retinopathy with macular edema, bilateral: Secondary | ICD-10-CM | POA: Diagnosis not present

## 2019-07-19 DIAGNOSIS — H43813 Vitreous degeneration, bilateral: Secondary | ICD-10-CM

## 2019-07-19 DIAGNOSIS — H35033 Hypertensive retinopathy, bilateral: Secondary | ICD-10-CM | POA: Diagnosis not present

## 2019-07-19 DIAGNOSIS — E11311 Type 2 diabetes mellitus with unspecified diabetic retinopathy with macular edema: Secondary | ICD-10-CM | POA: Diagnosis not present

## 2019-07-27 DIAGNOSIS — G4733 Obstructive sleep apnea (adult) (pediatric): Secondary | ICD-10-CM | POA: Diagnosis not present

## 2019-08-16 ENCOUNTER — Other Ambulatory Visit: Payer: Self-pay

## 2019-08-16 ENCOUNTER — Encounter (INDEPENDENT_AMBULATORY_CARE_PROVIDER_SITE_OTHER): Payer: Medicare Other | Admitting: Ophthalmology

## 2019-08-16 DIAGNOSIS — E11311 Type 2 diabetes mellitus with unspecified diabetic retinopathy with macular edema: Secondary | ICD-10-CM

## 2019-08-16 DIAGNOSIS — H35033 Hypertensive retinopathy, bilateral: Secondary | ICD-10-CM | POA: Diagnosis not present

## 2019-08-16 DIAGNOSIS — I10 Essential (primary) hypertension: Secondary | ICD-10-CM

## 2019-08-16 DIAGNOSIS — E113313 Type 2 diabetes mellitus with moderate nonproliferative diabetic retinopathy with macular edema, bilateral: Secondary | ICD-10-CM

## 2019-08-16 DIAGNOSIS — H33302 Unspecified retinal break, left eye: Secondary | ICD-10-CM

## 2019-08-16 DIAGNOSIS — H35372 Puckering of macula, left eye: Secondary | ICD-10-CM

## 2019-08-16 DIAGNOSIS — H43813 Vitreous degeneration, bilateral: Secondary | ICD-10-CM

## 2019-08-27 DIAGNOSIS — G4733 Obstructive sleep apnea (adult) (pediatric): Secondary | ICD-10-CM | POA: Diagnosis not present

## 2019-08-30 DIAGNOSIS — E1169 Type 2 diabetes mellitus with other specified complication: Secondary | ICD-10-CM | POA: Diagnosis not present

## 2019-08-30 DIAGNOSIS — I10 Essential (primary) hypertension: Secondary | ICD-10-CM | POA: Diagnosis not present

## 2019-08-30 DIAGNOSIS — I82509 Chronic embolism and thrombosis of unspecified deep veins of unspecified lower extremity: Secondary | ICD-10-CM | POA: Diagnosis not present

## 2019-09-04 ENCOUNTER — Ambulatory Visit (INDEPENDENT_AMBULATORY_CARE_PROVIDER_SITE_OTHER): Payer: Medicare Other | Admitting: Podiatry

## 2019-09-04 ENCOUNTER — Other Ambulatory Visit: Payer: Self-pay

## 2019-09-04 ENCOUNTER — Encounter: Payer: Self-pay | Admitting: Podiatry

## 2019-09-04 DIAGNOSIS — E0843 Diabetes mellitus due to underlying condition with diabetic autonomic (poly)neuropathy: Secondary | ICD-10-CM

## 2019-09-04 DIAGNOSIS — M79676 Pain in unspecified toe(s): Secondary | ICD-10-CM | POA: Diagnosis not present

## 2019-09-04 DIAGNOSIS — L989 Disorder of the skin and subcutaneous tissue, unspecified: Secondary | ICD-10-CM | POA: Diagnosis not present

## 2019-09-04 DIAGNOSIS — B351 Tinea unguium: Secondary | ICD-10-CM

## 2019-09-04 NOTE — Progress Notes (Signed)
° °  SUBJECTIVE Patient presents to office today complaining of elongated, thickened nails that cause pain while ambulating in shoes. She is unable to trim her own nails. She also notes painful callus lesions of the bilateral feet that have been present for the past few months. Walking and bearing weight increases the pain. She has not had any treatment at home for the symptoms. Patient is here for further evaluation and treatment.  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 since 1996   Insulin dependent diabetes mellitus    Mitral valve prolapse    Osteoarthritis    bilateral knee   Seasonal allergies    Sleep apnea    no CPAP use in several months, per pt.   Trigger finger, left middle finger 01/2017    OBJECTIVE General Patient is awake, alert, and oriented x 3 and in no acute distress. Derm Skin is dry and supple bilateral. Negative open lesions or macerations. Remaining integument unremarkable. Nails are tender, long, thickened and dystrophic with subungual debris, consistent with onychomycosis, 1-5 bilateral. No signs of infection noted. Hyperkeratotic lesion(s) present on the bilateral feet. Pain on palpation with a central nucleated core noted.  Vasc  DP and PT pedal pulses palpable bilaterally. Temperature gradient within normal limits.  Neuro Epicritic and protective threshold sensation grossly intact bilaterally.  Musculoskeletal Exam No symptomatic pedal deformities noted bilateral. Muscular strength within normal limits.  ASSESSMENT 1. Onychodystrophic nails 1-5 bilateral with hyperkeratosis of nails.  2.  Onychomycosis of nail due to dermatophyte bilateral 3. Pre-ulcerative callus lesions noted to the bilateral feet x 2   PLAN OF CARE 1. Patient evaluated today.  2. Instructed to maintain good pedal hygiene and foot care.  3. Mechanical debridement of nails 1-5 bilaterally performed using a nail nipper. Filed with dremel without incident.  4. Excisional debridement of keratotic lesion(s) using a chisel blade was performed without incident. Light dressing applied.  5. Return to clinic in 3 mos.    Edrick Kins, DPM [facility]  Dr. Edrick Kins, [address]                                        [place], [place] [address]                Office [phone]  Fax [phone]

## 2019-09-05 DIAGNOSIS — H401232 Low-tension glaucoma, bilateral, moderate stage: Secondary | ICD-10-CM | POA: Diagnosis not present

## 2019-09-05 DIAGNOSIS — H524 Presbyopia: Secondary | ICD-10-CM | POA: Diagnosis not present

## 2019-09-05 DIAGNOSIS — E113311 Type 2 diabetes mellitus with moderate nonproliferative diabetic retinopathy with macular edema, right eye: Secondary | ICD-10-CM | POA: Diagnosis not present

## 2019-09-05 DIAGNOSIS — H35013 Changes in retinal vascular appearance, bilateral: Secondary | ICD-10-CM | POA: Diagnosis not present

## 2019-09-05 DIAGNOSIS — E113392 Type 2 diabetes mellitus with moderate nonproliferative diabetic retinopathy without macular edema, left eye: Secondary | ICD-10-CM | POA: Diagnosis not present

## 2019-09-13 ENCOUNTER — Other Ambulatory Visit: Payer: Self-pay

## 2019-09-13 ENCOUNTER — Encounter (INDEPENDENT_AMBULATORY_CARE_PROVIDER_SITE_OTHER): Payer: Medicare Other | Admitting: Ophthalmology

## 2019-09-13 DIAGNOSIS — E113212 Type 2 diabetes mellitus with mild nonproliferative diabetic retinopathy with macular edema, left eye: Secondary | ICD-10-CM

## 2019-09-13 DIAGNOSIS — I10 Essential (primary) hypertension: Secondary | ICD-10-CM

## 2019-09-13 DIAGNOSIS — H43813 Vitreous degeneration, bilateral: Secondary | ICD-10-CM

## 2019-09-13 DIAGNOSIS — H33302 Unspecified retinal break, left eye: Secondary | ICD-10-CM

## 2019-09-13 DIAGNOSIS — H35033 Hypertensive retinopathy, bilateral: Secondary | ICD-10-CM

## 2019-09-13 DIAGNOSIS — E113311 Type 2 diabetes mellitus with moderate nonproliferative diabetic retinopathy with macular edema, right eye: Secondary | ICD-10-CM | POA: Diagnosis not present

## 2019-09-13 DIAGNOSIS — E11311 Type 2 diabetes mellitus with unspecified diabetic retinopathy with macular edema: Secondary | ICD-10-CM

## 2019-09-13 DIAGNOSIS — H35372 Puckering of macula, left eye: Secondary | ICD-10-CM

## 2019-09-26 DIAGNOSIS — G4733 Obstructive sleep apnea (adult) (pediatric): Secondary | ICD-10-CM | POA: Diagnosis not present

## 2019-10-06 DIAGNOSIS — G4733 Obstructive sleep apnea (adult) (pediatric): Secondary | ICD-10-CM | POA: Diagnosis not present

## 2019-10-11 ENCOUNTER — Encounter (INDEPENDENT_AMBULATORY_CARE_PROVIDER_SITE_OTHER): Payer: Medicare Other | Admitting: Ophthalmology

## 2019-10-13 DIAGNOSIS — I82509 Chronic embolism and thrombosis of unspecified deep veins of unspecified lower extremity: Secondary | ICD-10-CM | POA: Diagnosis not present

## 2019-10-13 DIAGNOSIS — Z7901 Long term (current) use of anticoagulants: Secondary | ICD-10-CM | POA: Diagnosis not present

## 2019-10-13 DIAGNOSIS — I270 Primary pulmonary hypertension: Secondary | ICD-10-CM | POA: Diagnosis not present

## 2019-10-13 DIAGNOSIS — L298 Other pruritus: Secondary | ICD-10-CM | POA: Diagnosis not present

## 2019-10-13 DIAGNOSIS — E1165 Type 2 diabetes mellitus with hyperglycemia: Secondary | ICD-10-CM | POA: Diagnosis not present

## 2019-10-13 DIAGNOSIS — L720 Epidermal cyst: Secondary | ICD-10-CM | POA: Diagnosis not present

## 2019-10-13 DIAGNOSIS — Z79899 Other long term (current) drug therapy: Secondary | ICD-10-CM | POA: Diagnosis not present

## 2019-10-17 ENCOUNTER — Ambulatory Visit
Admission: RE | Admit: 2019-10-17 | Discharge: 2019-10-17 | Disposition: A | Discharge status: Home / Self Care | Payer: Medicare Other | Source: Ambulatory Visit | Attending: Dermatology | Admitting: Dermatology

## 2019-10-17 ENCOUNTER — Other Ambulatory Visit: Payer: Self-pay

## 2019-10-17 ENCOUNTER — Other Ambulatory Visit: Payer: Self-pay | Admitting: Dermatology

## 2019-10-17 DIAGNOSIS — L299 Pruritus, unspecified: Secondary | ICD-10-CM

## 2019-10-17 DIAGNOSIS — M47814 Spondylosis without myelopathy or radiculopathy, thoracic region: Secondary | ICD-10-CM | POA: Diagnosis not present

## 2019-10-26 ENCOUNTER — Other Ambulatory Visit: Payer: Self-pay

## 2019-10-26 ENCOUNTER — Encounter (INDEPENDENT_AMBULATORY_CARE_PROVIDER_SITE_OTHER): Payer: Medicare Other | Admitting: Ophthalmology

## 2019-10-26 DIAGNOSIS — H33302 Unspecified retinal break, left eye: Secondary | ICD-10-CM

## 2019-10-26 DIAGNOSIS — E11311 Type 2 diabetes mellitus with unspecified diabetic retinopathy with macular edema: Secondary | ICD-10-CM

## 2019-10-26 DIAGNOSIS — E113313 Type 2 diabetes mellitus with moderate nonproliferative diabetic retinopathy with macular edema, bilateral: Secondary | ICD-10-CM

## 2019-10-26 DIAGNOSIS — H35033 Hypertensive retinopathy, bilateral: Secondary | ICD-10-CM

## 2019-10-26 DIAGNOSIS — I10 Essential (primary) hypertension: Secondary | ICD-10-CM

## 2019-10-26 DIAGNOSIS — H43813 Vitreous degeneration, bilateral: Secondary | ICD-10-CM

## 2019-10-27 DIAGNOSIS — G4733 Obstructive sleep apnea (adult) (pediatric): Secondary | ICD-10-CM | POA: Diagnosis not present

## 2019-11-01 ENCOUNTER — Telehealth: Payer: Self-pay | Admitting: Orthopaedic Surgery

## 2019-11-01 DIAGNOSIS — L298 Other pruritus: Secondary | ICD-10-CM | POA: Diagnosis not present

## 2019-11-01 NOTE — Telephone Encounter (Signed)
Patient called.   She is requesting a referral to continue seeing Dr.Deveshwar   Call back: [phone]

## 2019-11-02 ENCOUNTER — Other Ambulatory Visit: Payer: Self-pay

## 2019-11-02 DIAGNOSIS — L930 Discoid lupus erythematosus: Secondary | ICD-10-CM

## 2019-11-02 NOTE — Telephone Encounter (Signed)
Please advise. I do not see in her chart where she has ever saw Dr.Deveswhar or what this referral would be for.

## 2019-11-02 NOTE — Telephone Encounter (Signed)
Not sure either- please call and inquire why she would like to see Dr Estanislado Pandy. If for multiple joints aches OK to make referral

## 2019-11-02 NOTE — Telephone Encounter (Signed)
Spoke with patient. She has a history of Lupus and it has been over 3years since she saw Dr.Deveshwar, therefore she needs a referral. Referral has been placed and patient notified.

## 2019-11-14 DIAGNOSIS — E1165 Type 2 diabetes mellitus with hyperglycemia: Secondary | ICD-10-CM | POA: Diagnosis not present

## 2019-11-14 DIAGNOSIS — I82509 Chronic embolism and thrombosis of unspecified deep veins of unspecified lower extremity: Secondary | ICD-10-CM | POA: Diagnosis not present

## 2019-11-14 DIAGNOSIS — Z7901 Long term (current) use of anticoagulants: Secondary | ICD-10-CM | POA: Diagnosis not present

## 2019-11-14 DIAGNOSIS — I270 Primary pulmonary hypertension: Secondary | ICD-10-CM | POA: Diagnosis not present

## 2019-11-23 ENCOUNTER — Other Ambulatory Visit: Payer: Self-pay

## 2019-11-23 ENCOUNTER — Encounter (INDEPENDENT_AMBULATORY_CARE_PROVIDER_SITE_OTHER): Payer: Medicare Other | Admitting: Ophthalmology

## 2019-11-23 DIAGNOSIS — H35033 Hypertensive retinopathy, bilateral: Secondary | ICD-10-CM

## 2019-11-23 DIAGNOSIS — H43813 Vitreous degeneration, bilateral: Secondary | ICD-10-CM

## 2019-11-23 DIAGNOSIS — E113312 Type 2 diabetes mellitus with moderate nonproliferative diabetic retinopathy with macular edema, left eye: Secondary | ICD-10-CM

## 2019-11-23 DIAGNOSIS — E113211 Type 2 diabetes mellitus with mild nonproliferative diabetic retinopathy with macular edema, right eye: Secondary | ICD-10-CM

## 2019-11-23 DIAGNOSIS — H35372 Puckering of macula, left eye: Secondary | ICD-10-CM

## 2019-11-23 DIAGNOSIS — E11311 Type 2 diabetes mellitus with unspecified diabetic retinopathy with macular edema: Secondary | ICD-10-CM | POA: Diagnosis not present

## 2019-11-23 DIAGNOSIS — I10 Essential (primary) hypertension: Secondary | ICD-10-CM

## 2019-11-27 DIAGNOSIS — G4733 Obstructive sleep apnea (adult) (pediatric): Secondary | ICD-10-CM | POA: Diagnosis not present

## 2019-12-06 ENCOUNTER — Ambulatory Visit (INDEPENDENT_AMBULATORY_CARE_PROVIDER_SITE_OTHER): Payer: Medicare Other | Admitting: Podiatry

## 2019-12-06 ENCOUNTER — Other Ambulatory Visit: Payer: Self-pay

## 2019-12-06 ENCOUNTER — Ambulatory Visit (INDEPENDENT_AMBULATORY_CARE_PROVIDER_SITE_OTHER): Payer: Medicare Other

## 2019-12-06 DIAGNOSIS — M79675 Pain in left toe(s): Secondary | ICD-10-CM | POA: Diagnosis not present

## 2019-12-06 DIAGNOSIS — B351 Tinea unguium: Secondary | ICD-10-CM

## 2019-12-06 DIAGNOSIS — M79674 Pain in right toe(s): Secondary | ICD-10-CM

## 2019-12-06 DIAGNOSIS — E0843 Diabetes mellitus due to underlying condition with diabetic autonomic (poly)neuropathy: Secondary | ICD-10-CM

## 2019-12-06 DIAGNOSIS — L989 Disorder of the skin and subcutaneous tissue, unspecified: Secondary | ICD-10-CM

## 2019-12-06 DIAGNOSIS — S93505A Unspecified sprain of left lesser toe(s), initial encounter: Secondary | ICD-10-CM

## 2019-12-06 NOTE — Progress Notes (Signed)
SUBJECTIVE Patient presents to office today complaining of elongated, thickened nails that cause pain while ambulating in shoes. She is unable to trim her own nails. She also notes painful callus lesions of the bilateral feet that have been present for the past few months. Walking and bearing weight increases the pain. She has not had any treatment at home for the symptoms.   Patient also reports a new complaint today regarding an injury that occurred approximately 2-3 weeks ago to her left third toe.  She states that her left third toe caught the carpet that she was standing on and bit her toe.  She was concerned for possible fracture.  She did buddy taping for a few days and now she presents for further treatment evaluation   Past Medical History:  Diagnosis Date  . Clotting disorder ([place])   . Cough 02/08/2017  . Diabetic retinopathy ([place])   . 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 Full dentures   . GERD (gastroesophageal reflux disease)   . Heart murmur   . History of blood clots 1996   groin  . History of glaucoma    had laser correction  . Hypertension    states under control with meds., has been on med. since 1996  . Insulin dependent diabetes mellitus   . Mitral valve prolapse   . Osteoarthritis    bilateral knee  . Seasonal allergies   . Sleep apnea    no CPAP use in several months, per pt.  . Trigger finger, left middle finger 01/2017    OBJECTIVE General Patient is awake, alert, and oriented x 3 and in no acute distress. Derm Skin is dry and supple bilateral. Negative open lesions or macerations. Remaining integument unremarkable. Nails are tender, long, thickened and dystrophic with subungual debris, consistent with onychomycosis, 1-5 bilateral. No signs of infection noted. Hyperkeratotic lesion(s) present on the bilateral feet. Pain on palpation with a central nucleated core noted.  Vasc  DP and PT pedal pulses palpable bilaterally. Temperature gradient  within normal limits.  Neuro Epicritic and protective threshold sensation grossly intact bilaterally.  Musculoskeletal Exam No symptomatic pedal deformities noted bilateral. Muscular strength within normal limits.  There is pain on palpation to the left third toe Radiographic exam no fracture identified.  Toes are in good rectus alignment.  Joint spaces preserved.  Normal osseous mineralization.  ASSESSMENT 1. Onychodystrophic nails 1-5 bilateral with hyperkeratosis of nails.  2. Onychomycosis of nail due to dermatophyte bilateral 3. Pre-ulcerative callus lesions noted to the bilateral feet x 2  4.  Left third toe sprain  PLAN OF CARE 1. Patient evaluated today.  2. Instructed to maintain good pedal hygiene and foot care.  3. Mechanical debridement of nails 1-5 bilaterally performed using a nail nipper. Filed with dremel without incident.  4. Excisional debridement of keratotic lesion(s) using a chisel blade was performed without incident. Light dressing applied.  5.  Recommend good supportive shoes and explained that the toe sprain should resolve over time.   6.  Return to clinic in 3 mos.    Edrick Kins, DPM [facility]  Dr. Edrick Kins, [address]                                        [place], [place] [address]  Office [phone]  Fax [phone]

## 2019-12-19 ENCOUNTER — Other Ambulatory Visit: Payer: Self-pay

## 2019-12-19 ENCOUNTER — Encounter (INDEPENDENT_AMBULATORY_CARE_PROVIDER_SITE_OTHER): Payer: Medicare Other | Admitting: Ophthalmology

## 2019-12-19 DIAGNOSIS — I10 Essential (primary) hypertension: Secondary | ICD-10-CM

## 2019-12-19 DIAGNOSIS — E11311 Type 2 diabetes mellitus with unspecified diabetic retinopathy with macular edema: Secondary | ICD-10-CM | POA: Diagnosis not present

## 2019-12-19 DIAGNOSIS — H35033 Hypertensive retinopathy, bilateral: Secondary | ICD-10-CM | POA: Diagnosis not present

## 2019-12-19 DIAGNOSIS — H33302 Unspecified retinal break, left eye: Secondary | ICD-10-CM | POA: Diagnosis not present

## 2019-12-19 DIAGNOSIS — H43813 Vitreous degeneration, bilateral: Secondary | ICD-10-CM

## 2019-12-19 DIAGNOSIS — E113313 Type 2 diabetes mellitus with moderate nonproliferative diabetic retinopathy with macular edema, bilateral: Secondary | ICD-10-CM | POA: Diagnosis not present

## 2019-12-27 DIAGNOSIS — G4733 Obstructive sleep apnea (adult) (pediatric): Secondary | ICD-10-CM | POA: Diagnosis not present

## 2019-12-29 DIAGNOSIS — E1169 Type 2 diabetes mellitus with other specified complication: Secondary | ICD-10-CM | POA: Diagnosis not present

## 2019-12-29 DIAGNOSIS — E039 Hypothyroidism, unspecified: Secondary | ICD-10-CM | POA: Diagnosis not present

## 2019-12-29 DIAGNOSIS — M130 Polyarthritis, unspecified: Secondary | ICD-10-CM | POA: Diagnosis not present

## 2019-12-29 DIAGNOSIS — M320 Drug-induced systemic lupus erythematosus: Secondary | ICD-10-CM | POA: Diagnosis not present

## 2019-12-29 DIAGNOSIS — E1142 Type 2 diabetes mellitus with diabetic polyneuropathy: Secondary | ICD-10-CM | POA: Diagnosis not present

## 2019-12-29 DIAGNOSIS — M3210 Systemic lupus erythematosus, organ or system involvement unspecified: Secondary | ICD-10-CM | POA: Diagnosis not present

## 2020-01-04 ENCOUNTER — Ambulatory Visit (INDEPENDENT_AMBULATORY_CARE_PROVIDER_SITE_OTHER): Payer: Medicare Other | Admitting: Orthopaedic Surgery

## 2020-01-04 ENCOUNTER — Encounter: Payer: Self-pay | Admitting: Orthopaedic Surgery

## 2020-01-04 ENCOUNTER — Other Ambulatory Visit: Payer: Self-pay

## 2020-01-04 VITALS — Ht 64.0 in | Wt 239.0 lb

## 2020-01-04 DIAGNOSIS — T84032D Mechanical loosening of internal right knee prosthetic joint, subsequent encounter: Secondary | ICD-10-CM

## 2020-01-04 DIAGNOSIS — Z96651 Presence of right artificial knee joint: Secondary | ICD-10-CM | POA: Diagnosis not present

## 2020-01-04 NOTE — Progress Notes (Signed)
Office Visit Note   [name]           Date of Birth: 09-Jan-1946           MRN: [id number] Visit Date: 01/04/2020              Requested by: Lucianne Lei, [address],  [place] [address] PCP: Lucianne Lei, MD   Assessment & Plan: Visit Diagnoses:  1. 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 S/P revision of total knee, right     Plan: 58-month status post revision of the right total knee replacement.  I revised the patella and tibial components.  Mrs. Starkel is doing quite well.  She does not have any issues with her knee.  Not using any ambulatory aid and most the time "does not think about it".  She is having to care for her husband who has congestive heart failure and it has been a real "sure" for her.  Her exam was benign.  Her knee was not hot red or swollen and there was no evidence of instability.  I think she is done very well.  I just encourage her to work on strengthening exercises as she still has weakened quads and hamstring and we will plan to see her back as needed  Follow-Up Instructions: Return if symptoms worsen or fail to improve.   Orders:  No orders of the defined types were placed in this encounter.  No orders of the defined types were placed in this encounter.     Procedures: No procedures performed   Clinical Data: No additional findings.   Subjective: Chief Complaint  Patient presents with  . Right Knee - Follow-up    Right total knee arthroplasty revision 01/31/2019  Patient presents today for follow up on her right knee. She had a right total knee arthroplasty revision on 01/31/2019. She is now about 11 months out from surgery. She is doing well. No complaints. HPI  Review of Systems  Constitutional: Positive for fatigue.  HENT: Negative for ear pain.   Eyes: Negative for pain.  Respiratory: Negative for shortness of breath.   Cardiovascular: Negative for leg  swelling.  Gastrointestinal: Negative for constipation and diarrhea.  Endocrine: Negative for cold intolerance and heat intolerance.  Genitourinary: Negative for difficulty urinating.  Musculoskeletal: Negative for joint swelling.  Skin: Negative for rash.  Allergic/Immunologic: Negative for food allergies.  Neurological: Negative for weakness.  Hematological: Does not bruise/bleed easily.  Psychiatric/Behavioral: Negative for sleep disturbance.     Objective: Vital Signs: Ht 5\' 4"  (1.626 m)   Wt 239 lb (108.4 kg)   BMI 41.02 kg/m   Physical Exam Constitutional:      Appearance: She is well-developed.  Eyes:     Pupils: Pupils are equal, round, and reactive to light.  Pulmonary:     Effort: Pulmonary effort is normal.  Skin:    General: Skin is warm and dry.  Neurological:     Mental Status: She is alert and oriented to person, place, and time.  Psychiatric:        Behavior: Behavior normal.     Ortho Exam awake alert and oriented x3.  Comfortable sitting.  BMI 41.  Right knee was not hot red warm or swollen.  No effusion.  Does open a little bit medially with a valgus stress of maybe 2 or 3 mm in the same laterally.  Has about a  2 mm anterior drawer sign.  No localized areas of tenderness.  Full extension and flexed but 105 degrees  Specialty Comments:  No specialty comments available.  Imaging: No results found.   PMFS History: Patient Active Problem List   Diagnosis Date Noted  . Pain in right knee 02/14/2019  . S/P revision of total knee, right 01/31/2019  . 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 Sleep apnea 01/31/2018  . Atrophic vaginitis 01/31/2018  . Hyponatremia 01/31/2018  . Increased body mass index 01/31/2018  . Osteoarthritis 01/31/2018  . Postmenopausal bleeding 01/31/2018  . Systemic lupus erythematosus ([place]) 01/31/2018  . Uterine leiomyoma 01/31/2018  . Trigger middle finger of left hand 01/11/2017  .  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 High risk medication use 01/06/2016  . 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 Vitamin D deficiency 01/06/2016  . Deep venous thrombosis of lower extremity ([place]) 06/04/2015  . Osteopenia 03/14/2012  . Diabetes mellitus type 2 in obese ([place]) 03/14/2012  . 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 Hypertension 03/14/2012   Past Medical History:  Diagnosis Date  . Clotting disorder ([place])   . Cough 02/08/2017  . Diabetic retinopathy ([place])   . 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 Full dentures   . GERD (gastroesophageal reflux disease)   . Heart murmur   . History of blood clots 1996   groin  . History of glaucoma    had laser correction  . Hypertension    states under control with meds., has been on med. since 1996  . Insulin dependent diabetes mellitus   . Mitral valve prolapse   . Osteoarthritis    bilateral knee  . Seasonal allergies   . Sleep apnea    no CPAP use in several months, per pt.  . Trigger finger, left middle finger 01/2017    Family History  Problem Relation Age of Onset  . Cancer Mother        colon  . Cancer Cousin        lung    Past Surgical History:  Procedure Laterality Date  . BACK SURGERY     lower - removed  cysts  . CATARACT EXTRACTION W/ INTRAOCULAR LENS  IMPLANT, BILATERAL Bilateral   . GLAUCOMA SURGERY Bilateral    laser  . HEMILAMINOTOMY LUMBAR SPINE Right 01/30/2009   L5  . JOINT REPLACEMENT    . TOTAL KNEE ARTHROPLASTY Right 09/22/2007  . TOTAL KNEE ARTHROPLASTY Left 02/01/2007  . TOTAL KNEE REVISION Right 01/31/2019   Procedure: RIGHT TOTAL KNEE REVISION;  Surgeon: [name], MD;  Location: [facility];  Service: Orthopedics;  Laterality: Right;  . TRIGGER FINGER RELEASE Right 12/14/2013   Procedure: RELEASE TRIGGER FINGER/A-1 PULLEY RIGHT RING FINGER;  Surgeon: Daryll Brod, MD;  Location:  [place];  Service: Orthopedics;  Laterality: Right;  . TRIGGER FINGER RELEASE Left 02/11/2017   Procedure: RELEASE TRIGGER FINGER/A-1 PULLEY;  Surgeon: Leanora Cover, MD;  Location: [facility];  Service: Orthopedics;  Laterality: Left;   Social History   Occupational History  . Not on file  Tobacco Use  . Smoking status: Never Smoker  . Smokeless tobacco: Never Used  Vaping Use  . Vaping Use: Never used  Substance and Sexual Activity  . Alcohol use: No  . Drug use: No  . Sexual activity: Not on file

## 2020-01-09 DIAGNOSIS — G4733 Obstructive sleep apnea (adult) (pediatric): Secondary | ICD-10-CM | POA: Diagnosis not present

## 2020-01-13 DIAGNOSIS — E1165 Type 2 diabetes mellitus with hyperglycemia: Secondary | ICD-10-CM | POA: Diagnosis not present

## 2020-01-13 DIAGNOSIS — I82509 Chronic embolism and thrombosis of unspecified deep veins of unspecified lower extremity: Secondary | ICD-10-CM | POA: Diagnosis not present

## 2020-01-13 DIAGNOSIS — I270 Primary pulmonary hypertension: Secondary | ICD-10-CM | POA: Diagnosis not present

## 2020-01-13 DIAGNOSIS — Z7901 Long term (current) use of anticoagulants: Secondary | ICD-10-CM | POA: Diagnosis not present

## 2020-01-16 ENCOUNTER — Encounter (INDEPENDENT_AMBULATORY_CARE_PROVIDER_SITE_OTHER): Payer: Medicare Other | Admitting: Ophthalmology

## 2020-01-27 DIAGNOSIS — G4733 Obstructive sleep apnea (adult) (pediatric): Secondary | ICD-10-CM | POA: Diagnosis not present

## 2020-02-02 ENCOUNTER — Encounter: Payer: Self-pay | Admitting: Internal Medicine

## 2020-02-02 ENCOUNTER — Ambulatory Visit (INDEPENDENT_AMBULATORY_CARE_PROVIDER_SITE_OTHER): Payer: Medicare Other | Admitting: Internal Medicine

## 2020-02-02 ENCOUNTER — Other Ambulatory Visit: Payer: Self-pay

## 2020-02-02 VITALS — BP 111/54 | HR 71 | Resp 17 | Ht 63.0 in | Wt 229.0 lb

## 2020-02-02 DIAGNOSIS — L930 Discoid lupus erythematosus: Secondary | ICD-10-CM | POA: Diagnosis not present

## 2020-02-02 DIAGNOSIS — M1991 Primary osteoarthritis, unspecified site: Secondary | ICD-10-CM | POA: Diagnosis not present

## 2020-02-02 NOTE — Patient Instructions (Addendum)
We are checking several tests for any evidence of lupus disease activity. I did not see any obvious changes on examination so if these are all negative probably no further treatment is needed. If they are abnormal we can discuss whether any more testing is needed or whether to go onto medication. We will give you a call after test results in a few days.

## 2020-02-02 NOTE — Progress Notes (Signed)
Office Visit Note  [name]             Date of Birth: April 27, 1945           MRN: [id number]             PCP: Lucianne Lei, MD Referring: [name], MD Visit Date: 02/02/2020   Subjective:  New Patient (Initial Visit) (Previous patient of Dr. Arlean Hopping more than 3 years ago, ? lupus diagnosis remission, abnormal labs)   History of Present Illness: Kaitlyn Jackson is a 74 y.o. female with a history of CAD, diabetes, and osteoarthritis here for evaluation of discoid lupus. She is a previous patient of Dr. Estanislado Pandy but has not come for follow up after several years. She stopped regular follow up with Dr. Estanislado Pandy since her skin disease seemed to remain in remission off any continued treatment.  During the past year she has noticed increased joint pain of multiple sites including both hands but also some back and hip pain. Labs checked last year showed possible increased inflammatory markers. She also has right knee pain with the history of hardware displacement after fall onto this knee after her TKA. During her previous management of discoid lupus she says this was diagnosed based on skin biopsy of her face. She recalls taking some type of medication that was "a sort of chemotherapy" daily but does not recall what this is. She has multiple relatives who have suffered severe complications of systemic lupus and is concerned about any disease activity that she might be missing currently. She has not noticed any new return of skin rashes.   Labs reviewed 12/2018 ESR 39 hsCRP > 10 R knee fluid aspirate WBC 812  Activities of Daily Living:  Patient reports morning stiffness for 24 hours.   Patient Denies nocturnal pain.  Difficulty dressing/grooming: Denies Difficulty climbing stairs: Reports Difficulty getting out of chair: Reports Difficulty using hands for taps, buttons, cutlery, and/or writing: Reports  Review of Systems  Constitutional: Positive for  fatigue.  HENT: Positive for mouth dryness.   Eyes: Negative for dryness.  Respiratory: Negative for shortness of breath.   Cardiovascular: Negative for swelling in legs/feet.  Gastrointestinal: Positive for constipation and diarrhea.  Endocrine: Negative for excessive thirst.  Genitourinary: Negative for difficulty urinating.  Musculoskeletal: Positive for arthralgias, gait problem, joint pain and morning stiffness.  Skin: Negative for rash.  Allergic/Immunologic: Negative for susceptible to infections.  Neurological: Positive for numbness.  Hematological: Negative for bruising/bleeding tendency.  Psychiatric/Behavioral: Negative for sleep disturbance.    PMFS History:  Patient Active Problem List   Diagnosis Date Noted  . Pain in right knee 02/14/2019  . S/P revision of total knee, right 01/31/2019  . 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 Sleep apnea 01/31/2018  . Atrophic vaginitis 01/31/2018  . Hyponatremia 01/31/2018  . Increased body mass index 01/31/2018  . Osteoarthritis 01/31/2018  . Postmenopausal bleeding 01/31/2018  . Systemic lupus erythematosus ([place]) 01/31/2018  . Uterine leiomyoma 01/31/2018  . Trigger middle finger of left hand 01/11/2017  . 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 High risk medication use 01/06/2016  . 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 Vitamin D deficiency 01/06/2016  . Deep venous thrombosis of lower extremity ([place]) 06/04/2015  . Osteopenia 03/14/2012  . Diabetes mellitus type 2 in obese ([place]) 03/14/2012  . 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 Hypertension 03/14/2012  Past Medical History:  Diagnosis Date  . Carpal tunnel syndrome   . Clotting disorder ([place])   . Cough 02/08/2017  . Diabetic retinopathy ([place])   . 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 Full dentures   . GERD  (gastroesophageal reflux disease)   . Heart murmur   . History of blood clots 1996   groin  . History of glaucoma    had laser correction  . Hypertension    states under control with meds., has been on med. since 1996  . Insulin dependent diabetes mellitus   . Mitral valve prolapse   . Neuropathy   . Osteoarthritis    bilateral knee  . Seasonal allergies   . Sleep apnea    no CPAP use in several months, per pt.  . Systemic lupus erythematosus ([place])   . Trigger finger, left middle finger 01/2017    Family History  Problem Relation Age of Onset  . Cancer Mother        colon  . Cancer Cousin        lung  . Diabetes Sister   . Diabetes Sister   . Diabetes Sister    Past Surgical History:  Procedure Laterality Date  . BACK SURGERY     lower - removed  cysts  . CATARACT EXTRACTION W/ INTRAOCULAR LENS  IMPLANT, BILATERAL Bilateral   . GLAUCOMA SURGERY Bilateral    laser  . HEMILAMINOTOMY LUMBAR SPINE Right 01/30/2009   L5  . JOINT REPLACEMENT    . TOTAL KNEE ARTHROPLASTY Right 09/22/2007  . TOTAL KNEE ARTHROPLASTY Left 02/01/2007  . TOTAL KNEE REVISION Right 01/31/2019   Procedure: RIGHT TOTAL KNEE REVISION;  Surgeon: [name], MD;  Location: [facility];  Service: Orthopedics;  Laterality: Right;  . TRIGGER FINGER RELEASE Right 12/14/2013   Procedure: RELEASE TRIGGER FINGER/A-1 PULLEY RIGHT RING FINGER;  Surgeon: Daryll Brod, MD;  Location: [facility];  Service: Orthopedics;  Laterality: Right;  . TRIGGER FINGER RELEASE Left 02/11/2017   Procedure: RELEASE TRIGGER FINGER/A-1 PULLEY;  Surgeon: Leanora Cover, MD;  Location: [facility];  Service: Orthopedics;  Laterality: Left;   Social History   Social History Narrative  . Not on file   Immunization History  Administered Date(s) Administered  . Influenza, High Dose Seasonal PF 12/22/2016, 12/09/2017  . PFIZER SARS-COV-2 Vaccination 04/22/2019, 05/13/2019, 01/18/2020  . Pneumococcal  Conjugate-13 12/09/2017  . Tdap 01/20/2018     Objective: Vital Signs: BP (!) 111/54 (BP Location: Right Arm, Patient Position: Sitting, Cuff Size: Normal)   Pulse 71   Resp 17   Ht _0  (1.6 m)   Wt 229 lb (103.9 kg)   BMI 40.57 kg/m    Physical Exam HENT:     Right Ear: External ear normal.     Left Ear: External ear normal.     Nose: Nose normal.     Mouth/Throat:     Mouth: Mucous membranes are moist.     Pharynx: Oropharynx is clear.  Eyes:     Conjunctiva/sclera: Conjunctivae normal.  Cardiovascular:     Rate and Rhythm: Normal rate and regular rhythm.  Pulmonary:     Effort: Pulmonary effort is normal.     Breath sounds: Normal breath sounds.  Skin:    General: Skin is warm and dry.     Findings: No rash.  Neurological:     General: No focal deficit present.     Mental Status:  She is alert.  Psychiatric:        Mood and Affect: Mood normal.      Musculoskeletal Exam: Neck full range of motion no tenderness Shoulder, elbow, wrist full ROM Right 3rd digit with a slight boutonniere's deformity and possible small nodule on flexor tendon but no locking, left hand normal ROM no swelling Normal hip internal and external rotation without pain, no tenderness to lateral hip palpation Well healed knee replacement scars b/l, full extension ROM right knee flexion slightly reduced  Investigation: No additional findings.  Imaging: No results found.  Recent Labs: Lab Results  Component Value Date   WBC 11.9 (H) 02/02/2019   HGB 10.0 (L) 02/02/2019   PLT 355 02/02/2019   NA 129 (L) 02/02/2019   K 4.2 02/02/2019   CL 93 (L) 02/02/2019   CO2 25 02/02/2019   GLUCOSE 224 (H) 02/02/2019   BUN 19 02/02/2019   CREATININE 0.98 02/02/2019   BILITOT 0.8 01/25/2019   ALKPHOS 91 01/25/2019   AST 18 01/25/2019   ALT 17 01/25/2019   PROT 7.0 01/25/2019   ALBUMIN 3.4 (L) 01/25/2019   CALCIUM 8.1 (L) 02/02/2019   GFRAA >60 02/02/2019    Speciality Comments: No  specialty comments available.  Procedures:  No procedures performed Allergies: Iodine   Assessment / Plan:     Visit Diagnoses: Discoid lupus erythematosus - Plan: Sedimentation rate, C-reactive protein, CBC with Differential/Platelet, COMPLETE METABOLIC PANEL WITH GFR, C3 and C4, Anti-DNA antibody, double-stranded, Protein / creatinine ratio, urine  I do not see evidence of active joint inflammation on exam today. She does have evidence of osteoarthritis in multiple sites and s/p b/l knee replacements. No skin lesions or other mucocutaneous disease at this time and large majority of patients with limited distribution of discoid lupus never develop systemic disease. She has concerns due to family lupus complications and will check labs today looking for evidence of disease activity with CBC, CMP, ESR, complement C3 C4, dsDNA, urine protein. If lab workup is negative or equivocal low suspicion of systemic disease.   Orders: Orders Placed This Encounter  Procedures  . Sedimentation rate  . C-reactive protein  . CBC with Differential/Platelet  . COMPLETE METABOLIC PANEL WITH GFR  . C3 and C4  . Anti-DNA antibody, double-stranded  . Protein / creatinine ratio, urine   No orders of the defined types were placed in this encounter.   Follow-Up Instructions: Return in about 2 weeks (around 02/16/2020).   Collier Salina, MD  Note - This record has been created using [employer].  Chart creation errors have been sought, but may not always  have been located. Such creation errors do not reflect on  the standard of medical care.

## 2020-02-05 LAB — COMPLETE METABOLIC PANEL WITH GFR
AG Ratio: 1.2 (calc) (ref 1.0–2.5)
ALT: 10 U/L (ref 6–29)
AST: 12 U/L (ref 10–35)
Albumin: 3.7 g/dL (ref 3.6–5.1)
Alkaline phosphatase (APISO): 111 U/L (ref 37–153)
BUN/Creatinine Ratio: 23 (calc) — ABNORMAL HIGH (ref 6–22)
BUN: 23 mg/dL (ref 7–25)
CO2: 26 mmol/L (ref 20–32)
Calcium: 9.2 mg/dL (ref 8.6–10.4)
Chloride: 102 mmol/L (ref 98–110)
Creat: 0.98 mg/dL — ABNORMAL HIGH (ref 0.60–0.93)
GFR, Est African American: 65 mL/min/{1.73_m2} (ref >=60)
GFR, Est Non African American: 56 mL/min/{1.73_m2} — ABNORMAL LOW (ref >=60)
Globulin: 3 g/dL (calc) (ref 1.9–3.7)
Glucose, Bld: 173 mg/dL — ABNORMAL HIGH (ref 65–139)
Potassium: 4.8 mmol/L (ref 3.5–5.3)
Sodium: 138 mmol/L (ref 135–146)
Total Bilirubin: 0.3 mg/dL (ref 0.2–1.2)
Total Protein: 6.7 g/dL (ref 6.1–8.1)

## 2020-02-05 LAB — CBC WITH DIFFERENTIAL/PLATELET
Absolute Monocytes: 588 cells/uL (ref 200–950)
Basophils Absolute: 39 cells/uL (ref 0–200)
Basophils Relative: 0.4 %
Eosinophils Absolute: 88 cells/uL (ref 15–500)
Eosinophils Relative: 0.9 %
HCT: 38.3 % (ref 35.0–45.0)
Hemoglobin: 11.9 g/dL (ref 11.7–15.5)
Lymphs Abs: 2705 cells/uL (ref 850–3900)
MCH: 27 pg (ref 27.0–33.0)
MCHC: 31.1 g/dL — ABNORMAL LOW (ref 32.0–36.0)
MCV: 87 fL (ref 80.0–100.0)
MPV: 10.6 fL (ref 7.5–12.5)
Monocytes Relative: 6 %
Neutro Abs: 6380 cells/uL (ref 1500–7800)
Neutrophils Relative %: 65.1 %
Platelets: 393 10*3/uL (ref 140–400)
RBC: 4.4 10*6/uL (ref 3.80–5.10)
RDW: 14 % (ref 11.0–15.0)
Total Lymphocyte: 27.6 %
WBC: 9.8 10*3/uL (ref 3.8–10.8)

## 2020-02-05 LAB — PROTEIN / CREATININE RATIO, URINE
Creatinine, Urine: 280 mg/dL — ABNORMAL HIGH (ref 20–275)
Protein/Creat Ratio: 132 mg/g creat (ref 21–161)
Protein/Creatinine Ratio: 0.132 mg/mg creat (ref 0.021–0.16)
Total Protein, Urine: 37 mg/dL — ABNORMAL HIGH (ref 5–24)

## 2020-02-05 LAB — ANTI-DNA ANTIBODY, DOUBLE-STRANDED: ds DNA Ab: 1 IU/mL

## 2020-02-05 LAB — SEDIMENTATION RATE: Sed Rate: 38 mm/h — ABNORMAL HIGH (ref 0–30)

## 2020-02-05 LAB — C3 AND C4
C3 Complement: 168 mg/dL (ref 83–193)
C4 Complement: 25 mg/dL (ref 15–57)

## 2020-02-05 LAB — C-REACTIVE PROTEIN: CRP: 16.8 mg/L — ABNORMAL HIGH (ref <8.0)

## 2020-02-07 ENCOUNTER — Telehealth: Payer: Self-pay | Admitting: *Deleted

## 2020-02-07 NOTE — Telephone Encounter (Signed)
I spoke with Kaitlyn Jackson regarding her recent lab results.  Her ESR and CRP remain slightly above normal.  The sedimentation rate is not significant elevated when corrected for age and sex.  I am not sure why the CRP elevation is present, this can be elevated with no corresponding inflammation in a percentage of patients, up to 30% or more with significant obesity and hers was always increased on previous labs from 1 year and 3 years ago.  More specific test including complements and dsDNA were entirely negative.  Protein creatinine ratio on the urine shows insignificant amount of proteinuria.  Blood count and metabolic profile are normal no evidence of autoimmune disease or cytopenias.  Based on all this I do not see evidence for systemic lupus and I think she remains at low risk to develop this.  No specific additional rheumatology work-up or treatment recommended at this time.

## 2020-02-07 NOTE — Telephone Encounter (Signed)
Patient contacted the office returning your call. Patient states she will be on her way to see her husband in the rehab center and please call her on her cell phone. Patient requested you give her about 1 hour before returning the call. Patient request you leave a message if she is unable to answer.

## 2020-02-16 ENCOUNTER — Ambulatory Visit: Payer: Medicare Other | Admitting: Internal Medicine

## 2020-02-26 DIAGNOSIS — G4733 Obstructive sleep apnea (adult) (pediatric): Secondary | ICD-10-CM | POA: Diagnosis not present

## 2020-03-06 ENCOUNTER — Ambulatory Visit: Payer: Medicare Other | Admitting: Podiatry

## 2020-03-28 DIAGNOSIS — G4733 Obstructive sleep apnea (adult) (pediatric): Secondary | ICD-10-CM | POA: Diagnosis not present

## 2020-04-08 ENCOUNTER — Other Ambulatory Visit: Payer: Self-pay

## 2020-04-08 ENCOUNTER — Ambulatory Visit (INDEPENDENT_AMBULATORY_CARE_PROVIDER_SITE_OTHER): Payer: Medicare Other | Admitting: Podiatry

## 2020-04-08 DIAGNOSIS — L989 Disorder of the skin and subcutaneous tissue, unspecified: Secondary | ICD-10-CM | POA: Diagnosis not present

## 2020-04-08 DIAGNOSIS — B351 Tinea unguium: Secondary | ICD-10-CM

## 2020-04-08 DIAGNOSIS — E0843 Diabetes mellitus due to underlying condition with diabetic autonomic (poly)neuropathy: Secondary | ICD-10-CM

## 2020-04-08 DIAGNOSIS — M79674 Pain in right toe(s): Secondary | ICD-10-CM | POA: Diagnosis not present

## 2020-04-08 DIAGNOSIS — M79675 Pain in left toe(s): Secondary | ICD-10-CM

## 2020-04-15 ENCOUNTER — Ambulatory Visit: Payer: Medicare Other | Admitting: Rheumatology

## 2020-04-16 DIAGNOSIS — Z634 Disappearance and death of family member: Secondary | ICD-10-CM | POA: Diagnosis not present

## 2020-04-16 DIAGNOSIS — I10 Essential (primary) hypertension: Secondary | ICD-10-CM | POA: Diagnosis not present

## 2020-04-16 DIAGNOSIS — E1169 Type 2 diabetes mellitus with other specified complication: Secondary | ICD-10-CM | POA: Diagnosis not present

## 2020-04-16 DIAGNOSIS — Z7901 Long term (current) use of anticoagulants: Secondary | ICD-10-CM | POA: Diagnosis not present

## 2020-04-16 NOTE — Progress Notes (Signed)
   SUBJECTIVE Patient presents to office today complaining of elongated, thickened nails that cause pain while ambulating in shoes. She is unable to trim her own nails. She also notes painful callus lesions of the bilateral feet that have been present for the past few months. Walking and bearing weight increases the pain. She has not had any treatment at home for the symptoms.   Past Medical History:  Diagnosis Date  . Carpal tunnel syndrome   . Clotting disorder ([place])   . Cough 02/08/2017  . Diabetic retinopathy ([place])   . 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 Full dentures   . GERD (gastroesophageal reflux disease)   . Heart murmur   . History of blood clots 1996   groin  . History of glaucoma    had laser correction  . Hypertension    states under control with meds., has been on med. since 1996  . Insulin dependent diabetes mellitus   . Mitral valve prolapse   . Neuropathy   . Osteoarthritis    bilateral knee  . Seasonal allergies   . Sleep apnea    no CPAP use in several months, per pt.  . Systemic lupus erythematosus ([place])   . Trigger finger, left middle finger 01/2017    OBJECTIVE General Patient is awake, alert, and oriented x 3 and in no acute distress. Derm Skin is dry and supple bilateral. Negative open lesions or macerations. Remaining integument unremarkable. Nails are tender, long, thickened and dystrophic with subungual debris, consistent with onychomycosis, 1-5 bilateral. No signs of infection noted. Hyperkeratotic lesion(s) present on the bilateral feet. Pain on palpation with a central nucleated core noted.  Vasc  DP and PT pedal pulses palpable bilaterally. Temperature gradient within normal limits.  Neuro Epicritic and protective threshold sensation grossly intact bilaterally.  Musculoskeletal Exam No symptomatic pedal deformities noted bilateral. Muscular strength within normal limits.  There is no pain on palpation to the left third  toe  ASSESSMENT 1. Onychodystrophic nails 1-5 bilateral with hyperkeratosis of nails.  2. Onychomycosis of nail due to dermatophyte bilateral 3. Pre-ulcerative callus lesions noted to the bilateral feet x 2  4.  Left third toe sprain; resolved  PLAN OF CARE 1. Patient evaluated today.  2. Instructed to maintain good pedal hygiene and foot care.  3. Mechanical debridement of nails 1-5 bilaterally performed using a nail nipper. Filed with dremel without incident.  4. Excisional debridement of keratotic lesion(s) using a chisel blade was performed without incident. Light dressing applied.  5.  Appointment with Pedorthist for custom molded diabetic shoes and insoles 6.  Return to clinic in 3 mos.    Edrick Kins, DPM [facility]  Dr. Edrick Kins, [address]                                        [place], [place] [address]                Office [phone]  Fax [phone]

## 2020-04-17 ENCOUNTER — Other Ambulatory Visit: Payer: Self-pay

## 2020-04-17 ENCOUNTER — Encounter (INDEPENDENT_AMBULATORY_CARE_PROVIDER_SITE_OTHER): Payer: Medicare Other | Admitting: Ophthalmology

## 2020-04-17 DIAGNOSIS — E113313 Type 2 diabetes mellitus with moderate nonproliferative diabetic retinopathy with macular edema, bilateral: Secondary | ICD-10-CM

## 2020-04-17 DIAGNOSIS — G4733 Obstructive sleep apnea (adult) (pediatric): Secondary | ICD-10-CM | POA: Diagnosis not present

## 2020-04-17 DIAGNOSIS — I10 Essential (primary) hypertension: Secondary | ICD-10-CM | POA: Diagnosis not present

## 2020-04-17 DIAGNOSIS — H35033 Hypertensive retinopathy, bilateral: Secondary | ICD-10-CM | POA: Diagnosis not present

## 2020-04-17 DIAGNOSIS — H33302 Unspecified retinal break, left eye: Secondary | ICD-10-CM | POA: Diagnosis not present

## 2020-04-17 DIAGNOSIS — H43813 Vitreous degeneration, bilateral: Secondary | ICD-10-CM

## 2020-04-28 DIAGNOSIS — G4733 Obstructive sleep apnea (adult) (pediatric): Secondary | ICD-10-CM | POA: Diagnosis not present

## 2020-05-06 ENCOUNTER — Ambulatory Visit: Payer: Medicare Other | Admitting: Rheumatology

## 2020-05-15 ENCOUNTER — Encounter (INDEPENDENT_AMBULATORY_CARE_PROVIDER_SITE_OTHER): Payer: Medicare Other | Admitting: Ophthalmology

## 2020-05-21 ENCOUNTER — Encounter (INDEPENDENT_AMBULATORY_CARE_PROVIDER_SITE_OTHER): Payer: Medicare Other | Admitting: Ophthalmology

## 2020-05-21 ENCOUNTER — Other Ambulatory Visit: Payer: Self-pay

## 2020-05-21 DIAGNOSIS — H35033 Hypertensive retinopathy, bilateral: Secondary | ICD-10-CM

## 2020-05-21 DIAGNOSIS — H43813 Vitreous degeneration, bilateral: Secondary | ICD-10-CM | POA: Diagnosis not present

## 2020-05-21 DIAGNOSIS — E113313 Type 2 diabetes mellitus with moderate nonproliferative diabetic retinopathy with macular edema, bilateral: Secondary | ICD-10-CM | POA: Diagnosis not present

## 2020-05-21 DIAGNOSIS — I10 Essential (primary) hypertension: Secondary | ICD-10-CM

## 2020-05-21 DIAGNOSIS — H33302 Unspecified retinal break, left eye: Secondary | ICD-10-CM

## 2020-05-26 DIAGNOSIS — G4733 Obstructive sleep apnea (adult) (pediatric): Secondary | ICD-10-CM | POA: Diagnosis not present

## 2020-05-28 DIAGNOSIS — Z0000 Encounter for general adult medical examination without abnormal findings: Secondary | ICD-10-CM | POA: Diagnosis not present

## 2020-05-28 DIAGNOSIS — M130 Polyarthritis, unspecified: Secondary | ICD-10-CM | POA: Diagnosis not present

## 2020-05-28 DIAGNOSIS — I10 Essential (primary) hypertension: Secondary | ICD-10-CM | POA: Diagnosis not present

## 2020-05-28 DIAGNOSIS — E039 Hypothyroidism, unspecified: Secondary | ICD-10-CM | POA: Diagnosis not present

## 2020-05-28 DIAGNOSIS — E1165 Type 2 diabetes mellitus with hyperglycemia: Secondary | ICD-10-CM | POA: Diagnosis not present

## 2020-05-28 DIAGNOSIS — E1169 Type 2 diabetes mellitus with other specified complication: Secondary | ICD-10-CM | POA: Diagnosis not present

## 2020-06-13 DIAGNOSIS — E1165 Type 2 diabetes mellitus with hyperglycemia: Secondary | ICD-10-CM | POA: Diagnosis not present

## 2020-06-13 DIAGNOSIS — I82509 Chronic embolism and thrombosis of unspecified deep veins of unspecified lower extremity: Secondary | ICD-10-CM | POA: Diagnosis not present

## 2020-06-13 DIAGNOSIS — I270 Primary pulmonary hypertension: Secondary | ICD-10-CM | POA: Diagnosis not present

## 2020-06-18 ENCOUNTER — Encounter (INDEPENDENT_AMBULATORY_CARE_PROVIDER_SITE_OTHER): Payer: Medicare Other | Admitting: Ophthalmology

## 2020-06-18 ENCOUNTER — Other Ambulatory Visit: Payer: Self-pay

## 2020-06-18 DIAGNOSIS — E113313 Type 2 diabetes mellitus with moderate nonproliferative diabetic retinopathy with macular edema, bilateral: Secondary | ICD-10-CM | POA: Diagnosis not present

## 2020-06-18 DIAGNOSIS — H35033 Hypertensive retinopathy, bilateral: Secondary | ICD-10-CM | POA: Diagnosis not present

## 2020-06-18 DIAGNOSIS — I10 Essential (primary) hypertension: Secondary | ICD-10-CM

## 2020-06-26 DIAGNOSIS — G4733 Obstructive sleep apnea (adult) (pediatric): Secondary | ICD-10-CM | POA: Diagnosis not present

## 2020-07-08 ENCOUNTER — Other Ambulatory Visit: Payer: Self-pay

## 2020-07-08 ENCOUNTER — Ambulatory Visit (INDEPENDENT_AMBULATORY_CARE_PROVIDER_SITE_OTHER): Payer: Medicare Other | Admitting: Podiatry

## 2020-07-08 DIAGNOSIS — E0843 Diabetes mellitus due to underlying condition with diabetic autonomic (poly)neuropathy: Secondary | ICD-10-CM

## 2020-07-08 DIAGNOSIS — M79675 Pain in left toe(s): Secondary | ICD-10-CM | POA: Diagnosis not present

## 2020-07-08 DIAGNOSIS — B351 Tinea unguium: Secondary | ICD-10-CM | POA: Diagnosis not present

## 2020-07-08 DIAGNOSIS — M79674 Pain in right toe(s): Secondary | ICD-10-CM

## 2020-07-08 DIAGNOSIS — L989 Disorder of the skin and subcutaneous tissue, unspecified: Secondary | ICD-10-CM | POA: Diagnosis not present

## 2020-07-08 NOTE — Progress Notes (Signed)
   SUBJECTIVE Patient presents to office today complaining of elongated, thickened nails that cause pain while ambulating in shoes. She is unable to trim her own nails. She also notes painful callus lesions of the bilateral feet that have been present for the past few months. Walking and bearing weight increases the pain. She has not had any treatment at home for the symptoms.   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 since 1996   Insulin dependent diabetes mellitus    Mitral valve prolapse    Neuropathy    Osteoarthritis    bilateral knee   Seasonal allergies    Sleep apnea    no CPAP use in several months, per pt.   Systemic lupus erythematosus (HCC)    Trigger finger, left middle finger 01/2017    OBJECTIVE General Patient is awake, alert, and oriented x 3 and in no acute distress. Derm Skin is dry and supple bilateral. Negative open lesions or macerations. Remaining integument unremarkable. Nails are tender, long, thickened and dystrophic with subungual debris, consistent with onychomycosis, 1-5 bilateral. No signs of infection noted. Hyperkeratotic lesion(s) present on the bilateral feet. Pain on palpation with a central nucleated core noted.  Vasc  DP and PT pedal pulses palpable bilaterally. Temperature gradient within normal limits.  Neuro Epicritic and protective threshold sensation grossly intact bilaterally.  Musculoskeletal Exam No symptomatic pedal deformities noted bilateral. Muscular strength within normal limits.    ASSESSMENT 1. Onychodystrophic nails 1-5 bilateral with hyperkeratosis of  nails.  2. Onychomycosis of nail due to dermatophyte bilateral 3. Pre-ulcerative callus lesions noted to the bilateral feet x 2   PLAN OF CARE 1. Patient evaluated today.  2. Instructed to maintain good pedal hygiene and foot care.  3. Mechanical debridement of nails 1-5 bilaterally performed using a nail nipper. Filed with dremel without incident.  4. Excisional debridement of keratotic lesion(s) using a chisel blade was performed without incident. Light dressing applied.  5.  Continue custom molded diabetic shoes and insoles 6.  Return to clinic in 3 mos.    Kolby Schara M. Kemi Gell, DPM [facility]  Dr. Leshay Desaulniers M. Vail Vuncannon, DPM    [address]                                   , [place] [address]                Office [phone]  Fax [phone]     

## 2020-07-12 DIAGNOSIS — H401232 Low-tension glaucoma, bilateral, moderate stage: Secondary | ICD-10-CM | POA: Diagnosis not present

## 2020-07-16 ENCOUNTER — Encounter (INDEPENDENT_AMBULATORY_CARE_PROVIDER_SITE_OTHER): Payer: Medicare Other | Admitting: Ophthalmology

## 2020-07-16 ENCOUNTER — Other Ambulatory Visit: Payer: Self-pay

## 2020-07-16 DIAGNOSIS — H33302 Unspecified retinal break, left eye: Secondary | ICD-10-CM | POA: Diagnosis not present

## 2020-07-16 DIAGNOSIS — H35372 Puckering of macula, left eye: Secondary | ICD-10-CM

## 2020-07-16 DIAGNOSIS — H43813 Vitreous degeneration, bilateral: Secondary | ICD-10-CM | POA: Diagnosis not present

## 2020-07-16 DIAGNOSIS — E113313 Type 2 diabetes mellitus with moderate nonproliferative diabetic retinopathy with macular edema, bilateral: Secondary | ICD-10-CM | POA: Diagnosis not present

## 2020-07-16 DIAGNOSIS — H35033 Hypertensive retinopathy, bilateral: Secondary | ICD-10-CM

## 2020-07-16 DIAGNOSIS — I10 Essential (primary) hypertension: Secondary | ICD-10-CM | POA: Diagnosis not present

## 2020-07-26 DIAGNOSIS — G4733 Obstructive sleep apnea (adult) (pediatric): Secondary | ICD-10-CM | POA: Diagnosis not present

## 2020-08-09 DIAGNOSIS — E1169 Type 2 diabetes mellitus with other specified complication: Secondary | ICD-10-CM | POA: Diagnosis not present

## 2020-08-09 DIAGNOSIS — I10 Essential (primary) hypertension: Secondary | ICD-10-CM | POA: Diagnosis not present

## 2020-08-09 DIAGNOSIS — E039 Hypothyroidism, unspecified: Secondary | ICD-10-CM | POA: Diagnosis not present

## 2020-08-09 DIAGNOSIS — E7800 Pure hypercholesterolemia, unspecified: Secondary | ICD-10-CM | POA: Diagnosis not present

## 2020-08-13 ENCOUNTER — Other Ambulatory Visit: Payer: Self-pay

## 2020-08-13 ENCOUNTER — Encounter (INDEPENDENT_AMBULATORY_CARE_PROVIDER_SITE_OTHER): Payer: Medicare Other | Admitting: Ophthalmology

## 2020-08-13 DIAGNOSIS — E113313 Type 2 diabetes mellitus with moderate nonproliferative diabetic retinopathy with macular edema, bilateral: Secondary | ICD-10-CM | POA: Diagnosis not present

## 2020-08-13 DIAGNOSIS — H43813 Vitreous degeneration, bilateral: Secondary | ICD-10-CM

## 2020-08-13 DIAGNOSIS — I10 Essential (primary) hypertension: Secondary | ICD-10-CM | POA: Diagnosis not present

## 2020-08-13 DIAGNOSIS — H35033 Hypertensive retinopathy, bilateral: Secondary | ICD-10-CM | POA: Diagnosis not present

## 2020-09-10 ENCOUNTER — Other Ambulatory Visit: Payer: Self-pay

## 2020-09-10 ENCOUNTER — Encounter (INDEPENDENT_AMBULATORY_CARE_PROVIDER_SITE_OTHER): Payer: Medicare Other | Admitting: Ophthalmology

## 2020-09-10 DIAGNOSIS — E113313 Type 2 diabetes mellitus with moderate nonproliferative diabetic retinopathy with macular edema, bilateral: Secondary | ICD-10-CM | POA: Diagnosis not present

## 2020-09-10 DIAGNOSIS — H43813 Vitreous degeneration, bilateral: Secondary | ICD-10-CM

## 2020-09-10 DIAGNOSIS — H35372 Puckering of macula, left eye: Secondary | ICD-10-CM | POA: Diagnosis not present

## 2020-09-10 DIAGNOSIS — H35033 Hypertensive retinopathy, bilateral: Secondary | ICD-10-CM

## 2020-09-10 DIAGNOSIS — H33302 Unspecified retinal break, left eye: Secondary | ICD-10-CM | POA: Diagnosis not present

## 2020-09-10 DIAGNOSIS — I10 Essential (primary) hypertension: Secondary | ICD-10-CM | POA: Diagnosis not present

## 2020-09-12 DIAGNOSIS — I82509 Chronic embolism and thrombosis of unspecified deep veins of unspecified lower extremity: Secondary | ICD-10-CM | POA: Diagnosis not present

## 2020-09-12 DIAGNOSIS — Z7901 Long term (current) use of anticoagulants: Secondary | ICD-10-CM | POA: Diagnosis not present

## 2020-09-12 DIAGNOSIS — E1165 Type 2 diabetes mellitus with hyperglycemia: Secondary | ICD-10-CM | POA: Diagnosis not present

## 2020-09-12 DIAGNOSIS — I270 Primary pulmonary hypertension: Secondary | ICD-10-CM | POA: Diagnosis not present

## 2020-10-07 ENCOUNTER — Ambulatory Visit (INDEPENDENT_AMBULATORY_CARE_PROVIDER_SITE_OTHER): Payer: Medicare Other | Admitting: Podiatry

## 2020-10-07 ENCOUNTER — Other Ambulatory Visit: Payer: Self-pay

## 2020-10-07 DIAGNOSIS — L989 Disorder of the skin and subcutaneous tissue, unspecified: Secondary | ICD-10-CM

## 2020-10-07 DIAGNOSIS — B351 Tinea unguium: Secondary | ICD-10-CM

## 2020-10-07 DIAGNOSIS — M79674 Pain in right toe(s): Secondary | ICD-10-CM | POA: Diagnosis not present

## 2020-10-07 DIAGNOSIS — M79675 Pain in left toe(s): Secondary | ICD-10-CM

## 2020-10-07 DIAGNOSIS — E0843 Diabetes mellitus due to underlying condition with diabetic autonomic (poly)neuropathy: Secondary | ICD-10-CM

## 2020-10-07 NOTE — Progress Notes (Signed)
   SUBJECTIVE Patient presents to office today complaining of elongated, thickened nails that cause pain while ambulating in shoes. She is unable to trim her own nails. She also notes painful callus lesions of the bilateral feet that have been present for the past few months. Walking and bearing weight increases the pain. She has not had any treatment at home for the symptoms.   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 since 1996   Insulin dependent diabetes mellitus    Mitral valve prolapse    Neuropathy    Osteoarthritis    bilateral knee   Seasonal allergies    Sleep apnea    no CPAP use in several months, per pt.   Systemic lupus erythematosus (HCC)    Trigger finger, left middle finger 01/2017    OBJECTIVE General Patient is awake, alert, and oriented x 3 and in no acute distress. Derm Skin is dry and supple bilateral. Negative open lesions or macerations. Remaining integument unremarkable. Nails are tender, long, thickened and dystrophic with subungual debris, consistent with onychomycosis, 1-5 bilateral. No signs of infection noted. Hyperkeratotic lesion(s) present on the bilateral feet. Pain on palpation with a central nucleated core noted.  Vasc  DP and PT pedal pulses palpable bilaterally. Temperature gradient within normal limits.  Neuro Epicritic and protective threshold sensation grossly intact bilaterally.  Musculoskeletal Exam No symptomatic pedal deformities noted bilateral. Muscular strength within normal limits.    ASSESSMENT 1. Onychodystrophic nails 1-5 bilateral with hyperkeratosis of  nails.  2. Onychomycosis of nail due to dermatophyte bilateral 3. Pre-ulcerative callus lesions noted to the bilateral feet x 2   PLAN OF CARE 1. Patient evaluated today.  2. Instructed to maintain good pedal hygiene and foot care.  3. Mechanical debridement of nails 1-5 bilaterally performed using a nail nipper. Filed with dremel without incident.  4. Excisional debridement of keratotic lesion(s) using a chisel blade was performed without incident. Light dressing applied.  5.  Continue custom molded diabetic shoes and insoles 6.  Return to clinic in 3 mos.    Edrick Kins, DPM [facility]  Dr. Edrick Kins, DPM    [address], [place] [address]                Office [phone]  Fax [phone]

## 2020-10-08 ENCOUNTER — Encounter (INDEPENDENT_AMBULATORY_CARE_PROVIDER_SITE_OTHER): Payer: Medicare Other | Admitting: Ophthalmology

## 2020-10-08 DIAGNOSIS — I10 Essential (primary) hypertension: Secondary | ICD-10-CM

## 2020-10-08 DIAGNOSIS — H35372 Puckering of macula, left eye: Secondary | ICD-10-CM | POA: Diagnosis not present

## 2020-10-08 DIAGNOSIS — H33302 Unspecified retinal break, left eye: Secondary | ICD-10-CM

## 2020-10-08 DIAGNOSIS — H35033 Hypertensive retinopathy, bilateral: Secondary | ICD-10-CM

## 2020-10-08 DIAGNOSIS — H43813 Vitreous degeneration, bilateral: Secondary | ICD-10-CM | POA: Diagnosis not present

## 2020-10-08 DIAGNOSIS — E113313 Type 2 diabetes mellitus with moderate nonproliferative diabetic retinopathy with macular edema, bilateral: Secondary | ICD-10-CM | POA: Diagnosis not present

## 2020-10-10 DIAGNOSIS — E1169 Type 2 diabetes mellitus with other specified complication: Secondary | ICD-10-CM | POA: Diagnosis not present

## 2020-10-10 DIAGNOSIS — M3210 Systemic lupus erythematosus, organ or system involvement unspecified: Secondary | ICD-10-CM | POA: Diagnosis not present

## 2020-10-10 DIAGNOSIS — I10 Essential (primary) hypertension: Secondary | ICD-10-CM | POA: Diagnosis not present

## 2020-10-19 DIAGNOSIS — Z1231 Encounter for screening mammogram for malignant neoplasm of breast: Secondary | ICD-10-CM | POA: Diagnosis not present

## 2020-10-23 DIAGNOSIS — G4733 Obstructive sleep apnea (adult) (pediatric): Secondary | ICD-10-CM | POA: Diagnosis not present

## 2020-11-12 ENCOUNTER — Encounter (INDEPENDENT_AMBULATORY_CARE_PROVIDER_SITE_OTHER): Payer: Medicare Other | Admitting: Ophthalmology

## 2020-11-12 ENCOUNTER — Other Ambulatory Visit: Payer: Self-pay

## 2020-11-12 DIAGNOSIS — E113313 Type 2 diabetes mellitus with moderate nonproliferative diabetic retinopathy with macular edema, bilateral: Secondary | ICD-10-CM | POA: Diagnosis not present

## 2020-11-12 DIAGNOSIS — H35033 Hypertensive retinopathy, bilateral: Secondary | ICD-10-CM | POA: Diagnosis not present

## 2020-11-12 DIAGNOSIS — H33302 Unspecified retinal break, left eye: Secondary | ICD-10-CM

## 2020-11-12 DIAGNOSIS — I10 Essential (primary) hypertension: Secondary | ICD-10-CM

## 2020-11-12 DIAGNOSIS — H43813 Vitreous degeneration, bilateral: Secondary | ICD-10-CM | POA: Diagnosis not present

## 2020-11-12 DIAGNOSIS — H35372 Puckering of macula, left eye: Secondary | ICD-10-CM | POA: Diagnosis not present

## 2020-11-13 DIAGNOSIS — Z7901 Long term (current) use of anticoagulants: Secondary | ICD-10-CM | POA: Diagnosis not present

## 2020-11-13 DIAGNOSIS — E1165 Type 2 diabetes mellitus with hyperglycemia: Secondary | ICD-10-CM | POA: Diagnosis not present

## 2020-11-13 DIAGNOSIS — I82509 Chronic embolism and thrombosis of unspecified deep veins of unspecified lower extremity: Secondary | ICD-10-CM | POA: Diagnosis not present

## 2020-11-13 DIAGNOSIS — I270 Primary pulmonary hypertension: Secondary | ICD-10-CM | POA: Diagnosis not present

## 2020-11-15 IMAGING — DX DG CHEST 2V
2 series · 2 of 2 positions shown · non-contrast
Comparison: Chest radiograph dated 01/09/2017.

CLINICAL DATA: 73-year-old female with preop radiograph.

EXAM:
CHEST - 2 VIEW

[chest pa]
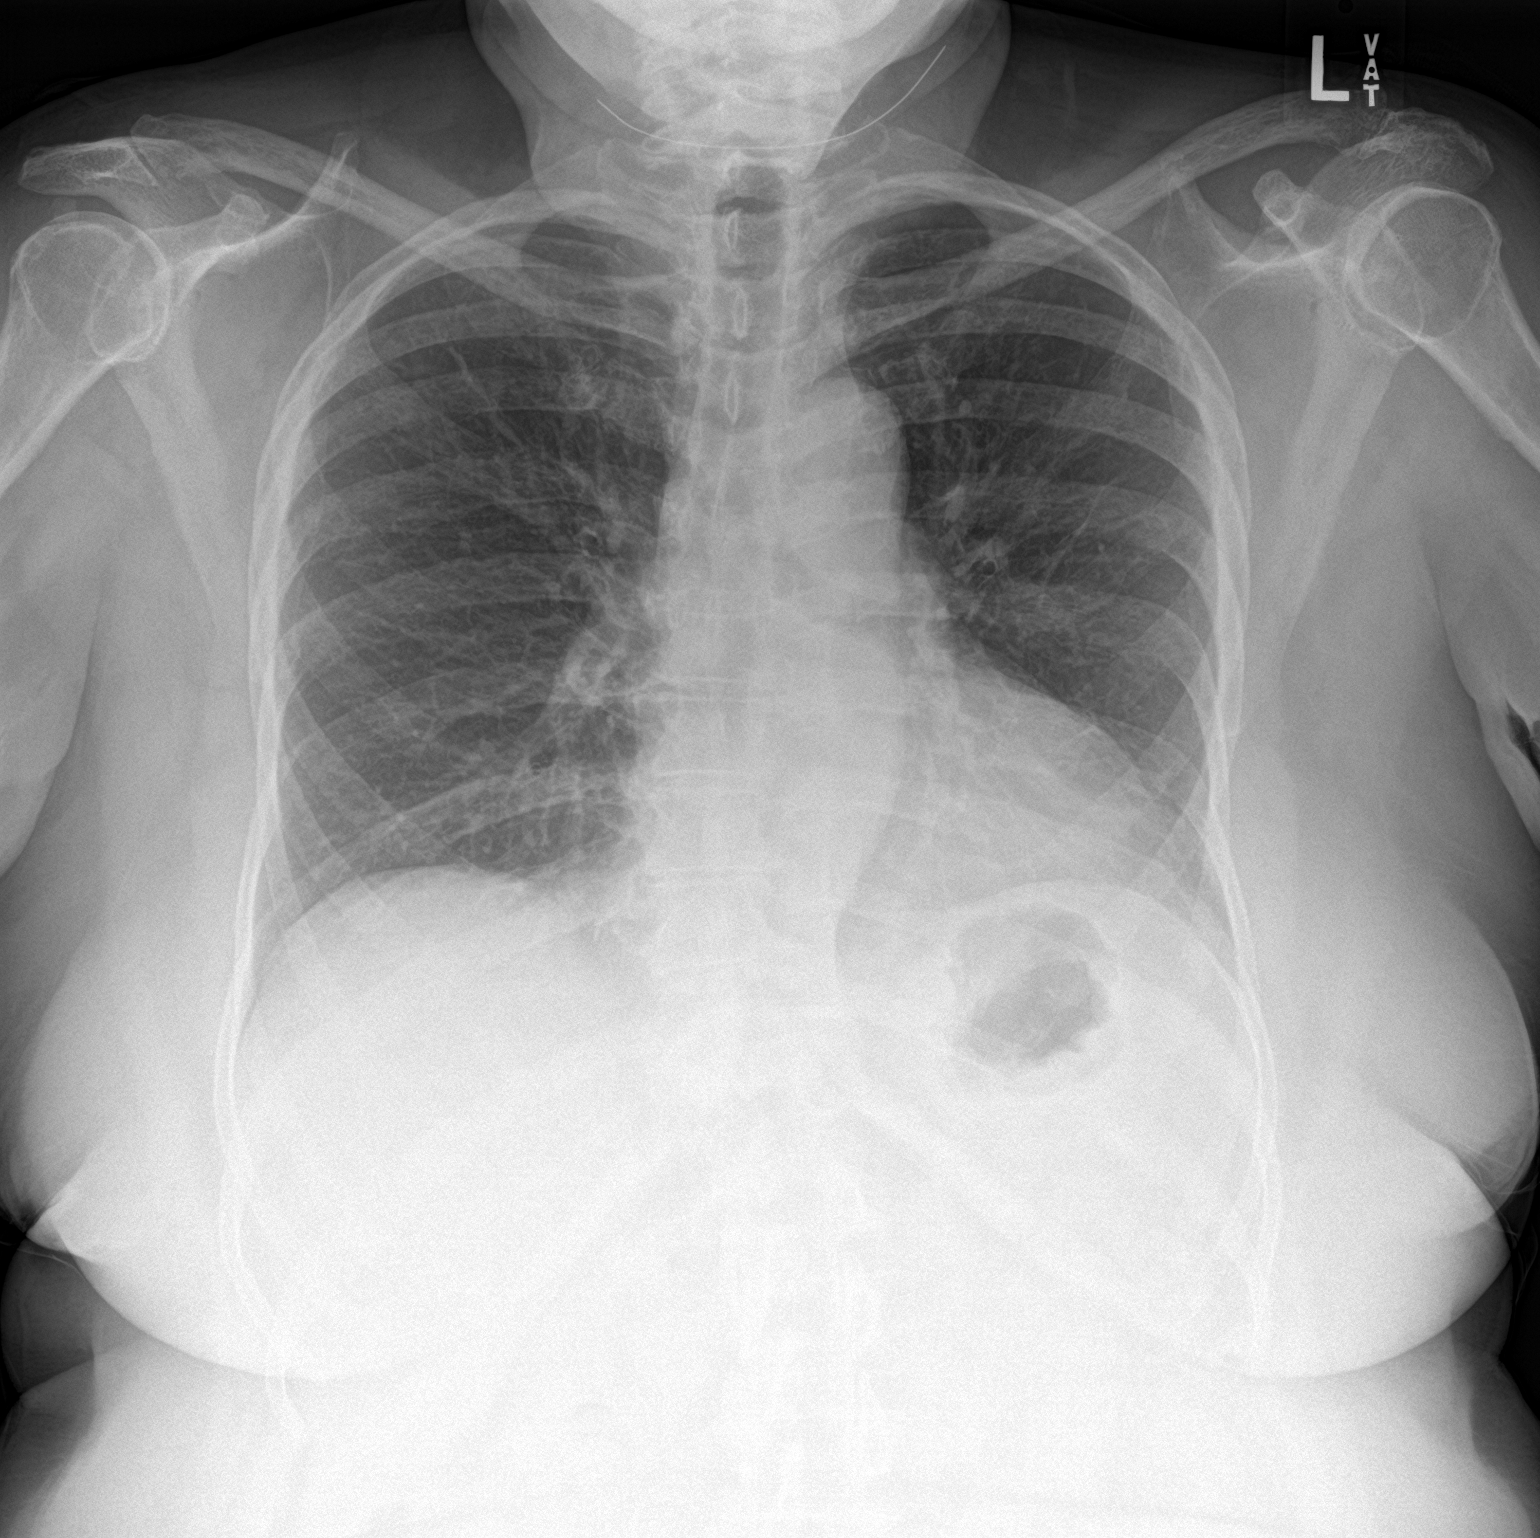

[chest lat]
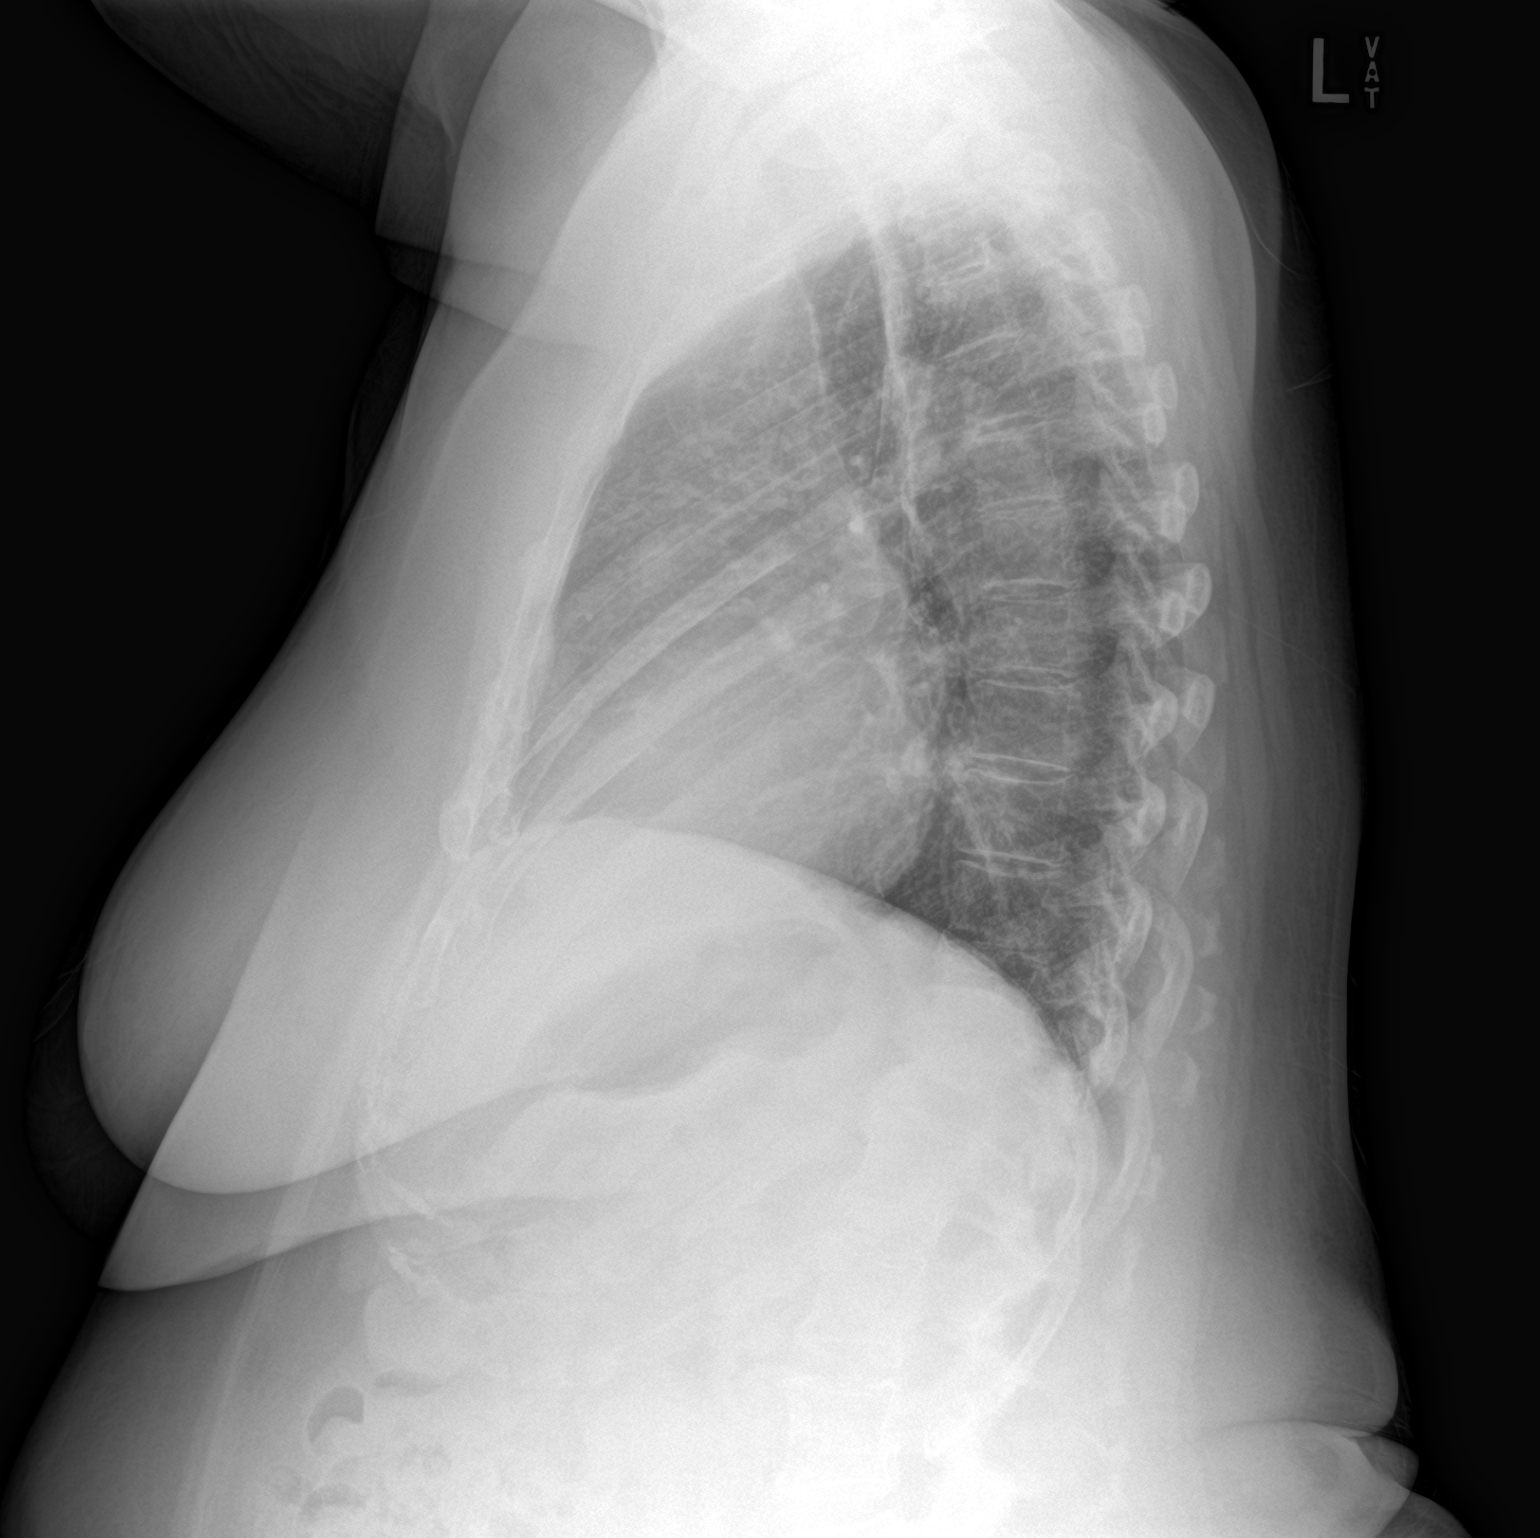

[2 of 2 positions shown; findings below may reference images not displayed]

FINDINGS: The lungs are clear. There is no pleural effusion or pneumothorax.
The cardiac silhouette is within normal limits. No acute osseous
pathology.
IMPRESSION: No active cardiopulmonary disease.

## 2020-12-12 DIAGNOSIS — E1169 Type 2 diabetes mellitus with other specified complication: Secondary | ICD-10-CM | POA: Diagnosis not present

## 2020-12-12 DIAGNOSIS — M545 Low back pain, unspecified: Secondary | ICD-10-CM | POA: Diagnosis not present

## 2020-12-12 DIAGNOSIS — Z7901 Long term (current) use of anticoagulants: Secondary | ICD-10-CM | POA: Diagnosis not present

## 2020-12-12 DIAGNOSIS — I10 Essential (primary) hypertension: Secondary | ICD-10-CM | POA: Diagnosis not present

## 2020-12-17 ENCOUNTER — Encounter (INDEPENDENT_AMBULATORY_CARE_PROVIDER_SITE_OTHER): Payer: Medicare Other | Admitting: Ophthalmology

## 2020-12-17 ENCOUNTER — Other Ambulatory Visit: Payer: Self-pay

## 2020-12-17 DIAGNOSIS — H33302 Unspecified retinal break, left eye: Secondary | ICD-10-CM

## 2020-12-17 DIAGNOSIS — H35372 Puckering of macula, left eye: Secondary | ICD-10-CM | POA: Diagnosis not present

## 2020-12-17 DIAGNOSIS — H35033 Hypertensive retinopathy, bilateral: Secondary | ICD-10-CM | POA: Diagnosis not present

## 2020-12-17 DIAGNOSIS — I10 Essential (primary) hypertension: Secondary | ICD-10-CM

## 2020-12-17 DIAGNOSIS — H43813 Vitreous degeneration, bilateral: Secondary | ICD-10-CM

## 2020-12-17 DIAGNOSIS — E113313 Type 2 diabetes mellitus with moderate nonproliferative diabetic retinopathy with macular edema, bilateral: Secondary | ICD-10-CM

## 2020-12-20 DIAGNOSIS — I10 Essential (primary) hypertension: Secondary | ICD-10-CM | POA: Diagnosis not present

## 2020-12-20 DIAGNOSIS — Z7901 Long term (current) use of anticoagulants: Secondary | ICD-10-CM | POA: Diagnosis not present

## 2020-12-20 DIAGNOSIS — R5382 Chronic fatigue, unspecified: Secondary | ICD-10-CM | POA: Diagnosis not present

## 2020-12-20 DIAGNOSIS — E1169 Type 2 diabetes mellitus with other specified complication: Secondary | ICD-10-CM | POA: Diagnosis not present

## 2021-01-08 ENCOUNTER — Ambulatory Visit (INDEPENDENT_AMBULATORY_CARE_PROVIDER_SITE_OTHER): Payer: Medicare Other | Admitting: Podiatry

## 2021-01-08 ENCOUNTER — Other Ambulatory Visit: Payer: Self-pay

## 2021-01-08 DIAGNOSIS — B351 Tinea unguium: Secondary | ICD-10-CM

## 2021-01-08 DIAGNOSIS — M79674 Pain in right toe(s): Secondary | ICD-10-CM

## 2021-01-08 DIAGNOSIS — M79675 Pain in left toe(s): Secondary | ICD-10-CM | POA: Diagnosis not present

## 2021-01-08 NOTE — Progress Notes (Signed)
   SUBJECTIVE Patient presents to office today complaining of elongated, thickened nails that cause pain while ambulating in shoes.  Patient is unable to trim their own nails. Patient is here for further evaluation and treatment.  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 since 1996   Insulin dependent diabetes mellitus    Mitral valve prolapse    Neuropathy    Osteoarthritis    bilateral knee   Seasonal allergies    Sleep apnea    no CPAP use in several months, per pt.   Systemic lupus erythematosus (HCC)    Trigger finger, left middle finger 01/2017    OBJECTIVE General Patient is awake, alert, and oriented x 3 and in no acute distress. Derm Skin is dry and supple bilateral. Negative open lesions or macerations. Remaining integument unremarkable. Nails are tender, long, thickened and dystrophic with subungual debris, consistent with onychomycosis, 1-5 bilateral. No signs of infection noted. Vasc  DP and PT pedal pulses palpable bilaterally. Temperature gradient within normal limits.  Neuro Epicritic and protective threshold sensation grossly intact bilaterally.  Musculoskeletal Exam No symptomatic pedal deformities noted bilateral. Muscular strength within normal limits.  ASSESSMENT 1.  Pain due to onychomycosis of toenails both  PLAN OF CARE 1. Patient evaluated today.  2. Instructed to maintain good pedal hygiene and foot care.  3. Mechanical debridement of nails 1-5 bilaterally performed using a nail nipper. Filed with dremel without incident.  4. Return to clinic in 3 mos.    Edrick Kins,  DPM [facility]  Dr. Edrick Kins, DPM    [address], [place] [address]                Office [phone]  Fax [phone]

## 2021-01-10 DIAGNOSIS — Z23 Encounter for immunization: Secondary | ICD-10-CM | POA: Diagnosis not present

## 2021-01-13 DIAGNOSIS — Z7901 Long term (current) use of anticoagulants: Secondary | ICD-10-CM | POA: Diagnosis not present

## 2021-01-13 DIAGNOSIS — E1165 Type 2 diabetes mellitus with hyperglycemia: Secondary | ICD-10-CM | POA: Diagnosis not present

## 2021-01-13 DIAGNOSIS — I82509 Chronic embolism and thrombosis of unspecified deep veins of unspecified lower extremity: Secondary | ICD-10-CM | POA: Diagnosis not present

## 2021-01-15 ENCOUNTER — Encounter (INDEPENDENT_AMBULATORY_CARE_PROVIDER_SITE_OTHER): Payer: Medicare Other | Admitting: Ophthalmology

## 2021-01-15 ENCOUNTER — Other Ambulatory Visit: Payer: Self-pay

## 2021-01-15 DIAGNOSIS — H35033 Hypertensive retinopathy, bilateral: Secondary | ICD-10-CM

## 2021-01-15 DIAGNOSIS — H43813 Vitreous degeneration, bilateral: Secondary | ICD-10-CM | POA: Diagnosis not present

## 2021-01-15 DIAGNOSIS — I10 Essential (primary) hypertension: Secondary | ICD-10-CM

## 2021-01-15 DIAGNOSIS — H33302 Unspecified retinal break, left eye: Secondary | ICD-10-CM

## 2021-01-15 DIAGNOSIS — E113313 Type 2 diabetes mellitus with moderate nonproliferative diabetic retinopathy with macular edema, bilateral: Secondary | ICD-10-CM | POA: Diagnosis not present

## 2021-01-28 DIAGNOSIS — I10 Essential (primary) hypertension: Secondary | ICD-10-CM | POA: Diagnosis not present

## 2021-01-28 DIAGNOSIS — R5382 Chronic fatigue, unspecified: Secondary | ICD-10-CM | POA: Diagnosis not present

## 2021-01-28 DIAGNOSIS — I82509 Chronic embolism and thrombosis of unspecified deep veins of unspecified lower extremity: Secondary | ICD-10-CM | POA: Diagnosis not present

## 2021-01-28 DIAGNOSIS — E1169 Type 2 diabetes mellitus with other specified complication: Secondary | ICD-10-CM | POA: Diagnosis not present

## 2021-01-28 DIAGNOSIS — E039 Hypothyroidism, unspecified: Secondary | ICD-10-CM | POA: Diagnosis not present

## 2021-01-28 DIAGNOSIS — Z7901 Long term (current) use of anticoagulants: Secondary | ICD-10-CM | POA: Diagnosis not present

## 2021-01-28 DIAGNOSIS — M3210 Systemic lupus erythematosus, organ or system involvement unspecified: Secondary | ICD-10-CM | POA: Diagnosis not present

## 2021-02-12 ENCOUNTER — Encounter (INDEPENDENT_AMBULATORY_CARE_PROVIDER_SITE_OTHER): Payer: Medicare Other | Admitting: Ophthalmology

## 2021-02-13 ENCOUNTER — Other Ambulatory Visit: Payer: Self-pay

## 2021-02-13 ENCOUNTER — Encounter (INDEPENDENT_AMBULATORY_CARE_PROVIDER_SITE_OTHER): Payer: Medicare Other | Admitting: Ophthalmology

## 2021-02-13 DIAGNOSIS — I10 Essential (primary) hypertension: Secondary | ICD-10-CM | POA: Diagnosis not present

## 2021-02-13 DIAGNOSIS — H33302 Unspecified retinal break, left eye: Secondary | ICD-10-CM | POA: Diagnosis not present

## 2021-02-13 DIAGNOSIS — E113313 Type 2 diabetes mellitus with moderate nonproliferative diabetic retinopathy with macular edema, bilateral: Secondary | ICD-10-CM | POA: Diagnosis not present

## 2021-02-13 DIAGNOSIS — H43813 Vitreous degeneration, bilateral: Secondary | ICD-10-CM | POA: Diagnosis not present

## 2021-02-13 DIAGNOSIS — H35033 Hypertensive retinopathy, bilateral: Secondary | ICD-10-CM | POA: Diagnosis not present

## 2021-03-06 DIAGNOSIS — J069 Acute upper respiratory infection, unspecified: Secondary | ICD-10-CM | POA: Diagnosis not present

## 2021-03-14 DIAGNOSIS — I82509 Chronic embolism and thrombosis of unspecified deep veins of unspecified lower extremity: Secondary | ICD-10-CM | POA: Diagnosis not present

## 2021-03-14 DIAGNOSIS — Z7901 Long term (current) use of anticoagulants: Secondary | ICD-10-CM | POA: Diagnosis not present

## 2021-03-14 DIAGNOSIS — I270 Primary pulmonary hypertension: Secondary | ICD-10-CM | POA: Diagnosis not present

## 2021-03-14 DIAGNOSIS — E1165 Type 2 diabetes mellitus with hyperglycemia: Secondary | ICD-10-CM | POA: Diagnosis not present

## 2021-03-20 ENCOUNTER — Encounter (INDEPENDENT_AMBULATORY_CARE_PROVIDER_SITE_OTHER): Payer: Medicare Other | Admitting: Ophthalmology

## 2021-03-27 ENCOUNTER — Other Ambulatory Visit: Payer: Self-pay

## 2021-03-27 ENCOUNTER — Encounter (INDEPENDENT_AMBULATORY_CARE_PROVIDER_SITE_OTHER): Payer: Medicare Other | Admitting: Ophthalmology

## 2021-03-27 DIAGNOSIS — E113313 Type 2 diabetes mellitus with moderate nonproliferative diabetic retinopathy with macular edema, bilateral: Secondary | ICD-10-CM | POA: Diagnosis not present

## 2021-03-27 DIAGNOSIS — I10 Essential (primary) hypertension: Secondary | ICD-10-CM | POA: Diagnosis not present

## 2021-03-27 DIAGNOSIS — H35033 Hypertensive retinopathy, bilateral: Secondary | ICD-10-CM | POA: Diagnosis not present

## 2021-03-27 DIAGNOSIS — H43813 Vitreous degeneration, bilateral: Secondary | ICD-10-CM

## 2021-04-07 DIAGNOSIS — E1169 Type 2 diabetes mellitus with other specified complication: Secondary | ICD-10-CM | POA: Diagnosis not present

## 2021-04-07 DIAGNOSIS — E7800 Pure hypercholesterolemia, unspecified: Secondary | ICD-10-CM | POA: Diagnosis not present

## 2021-04-07 DIAGNOSIS — I82509 Chronic embolism and thrombosis of unspecified deep veins of unspecified lower extremity: Secondary | ICD-10-CM | POA: Diagnosis not present

## 2021-04-07 DIAGNOSIS — Z7901 Long term (current) use of anticoagulants: Secondary | ICD-10-CM | POA: Diagnosis not present

## 2021-04-07 DIAGNOSIS — I10 Essential (primary) hypertension: Secondary | ICD-10-CM | POA: Diagnosis not present

## 2021-04-07 DIAGNOSIS — E039 Hypothyroidism, unspecified: Secondary | ICD-10-CM | POA: Diagnosis not present

## 2021-04-13 DIAGNOSIS — I270 Primary pulmonary hypertension: Secondary | ICD-10-CM | POA: Diagnosis not present

## 2021-04-13 DIAGNOSIS — Z7901 Long term (current) use of anticoagulants: Secondary | ICD-10-CM | POA: Diagnosis not present

## 2021-04-13 DIAGNOSIS — E1165 Type 2 diabetes mellitus with hyperglycemia: Secondary | ICD-10-CM | POA: Diagnosis not present

## 2021-04-13 DIAGNOSIS — I82509 Chronic embolism and thrombosis of unspecified deep veins of unspecified lower extremity: Secondary | ICD-10-CM | POA: Diagnosis not present

## 2021-04-14 ENCOUNTER — Other Ambulatory Visit: Payer: Self-pay

## 2021-04-14 ENCOUNTER — Ambulatory Visit (INDEPENDENT_AMBULATORY_CARE_PROVIDER_SITE_OTHER): Payer: Medicare Other | Admitting: Podiatry

## 2021-04-14 ENCOUNTER — Ambulatory Visit: Payer: Medicare Other

## 2021-04-14 DIAGNOSIS — B351 Tinea unguium: Secondary | ICD-10-CM | POA: Diagnosis not present

## 2021-04-14 DIAGNOSIS — M2141 Flat foot [pes planus] (acquired), right foot: Secondary | ICD-10-CM

## 2021-04-14 DIAGNOSIS — M79675 Pain in left toe(s): Secondary | ICD-10-CM | POA: Diagnosis not present

## 2021-04-14 DIAGNOSIS — M79674 Pain in right toe(s): Secondary | ICD-10-CM

## 2021-04-14 DIAGNOSIS — E0843 Diabetes mellitus due to underlying condition with diabetic autonomic (poly)neuropathy: Secondary | ICD-10-CM

## 2021-04-14 DIAGNOSIS — M2142 Flat foot [pes planus] (acquired), left foot: Secondary | ICD-10-CM | POA: Diagnosis not present

## 2021-04-14 NOTE — Progress Notes (Signed)
SITUATION Reason for Consult: Evaluation for Prefabricated Diabetic Shoes and Bilateral Custom Diabetic Inserts. Patient / Caregiver Report: Patient would like well fitting shoes  OBJECTIVE DATA: Patient History / Diagnosis:    ICD-10-CM   1. Diabetes mellitus due to underlying condition with diabetic autonomic neuropathy, unspecified whether long term insulin use (HCC)  E08.43     2. Pes planus of both feet  M21.41    M21.42       Current or Previous Devices:   Historical diabetic shoe user  In-Person Foot Examination: Ulcers & Callousing:   None and no history  Toe / Foot Deformities:   - Pes Planus   Shoe Size: 44M  ORTHOTIC RECOMMENDATION Recommended Devices: - 1x pair prefabricated PDAC approved diabetic shoes: Patient selected - Orthofeet 844 44M - 3x pair custom-to-patient vacuum formed diabetic insoles.   GOALS OF SHOES AND INSOLES - Reduce shear and pressure - Reduce / Prevent callus formation - Reduce / Prevent ulceration - Protect the fragile healing compromised diabetic foot.  Patient would benefit from diabetic shoes and inserts as patient has diabetes mellitus and the patient has one or more of the following conditions: - Peripheral neuropathy with evidence of callus formation - Foot deformity - Poor circulation  ACTIONS PERFORMED Patient was casted for insoles via crush box and measured for shoes via brannock device. Procedure was explained and patient tolerated procedure well. All questions were answered and concerns addressed.  PLAN Patient is to ensure treating physician receives and completes diabetic paperwork. Casts and shoe order are to be held until paperwork is received. Once received patient is to be scheduled for fitting in four weeks.

## 2021-04-14 NOTE — Progress Notes (Signed)
° °  SUBJECTIVE Patient presents to office today complaining of elongated, thickened nails that cause pain while ambulating in shoes.  Patient is unable to trim their own nails.   Patient is also complaining about how her left foot collapses and rolls whenever she walks.  She denies a history of injury.  She is concerned because she believes it may be causing knee pain.  She does have a history of diabetes which is controlled.  Patient is here for further evaluation and treatment.  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 since 1996   Insulin dependent diabetes mellitus    Mitral valve prolapse    Neuropathy    Osteoarthritis    bilateral knee   Seasonal allergies    Sleep apnea    no CPAP use in several months, per pt.   Systemic lupus erythematosus (HCC)    Trigger finger, left middle finger 01/2017    OBJECTIVE General Patient is awake, alert, and oriented x 3 and in no acute distress. Derm Skin is dry and supple bilateral. Negative open lesions or macerations. Remaining integument unremarkable. Nails are tender, long, thickened and dystrophic with subungual debris, consistent with onychomycosis, 1-5 bilateral. No signs of infection noted. Vasc  DP and PT pedal pulses palpable bilaterally. Temperature gradient within normal limits.  Neuro Epicritic and protective threshold sensation grossly intact bilaterally.  Musculoskeletal Exam pes planus deformity noted to the bilateral feet left greater than the right.  There is collapse of the medial longitudinal arch and tenderness along the posterior tibial tendon  of the left specifically  ASSESSMENT 1.  Pain due to onychomycosis of toenails both 2.  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  3. PTTD LLE 4.  Diabetes mellitus with peripheral polyneuropathy  PLAN OF CARE 1. Patient evaluated today.  2. Instructed to maintain good pedal hygiene and foot care.  3. Mechanical debridement of nails 1-5 bilaterally performed using a nail nipper. Filed with dremel without incident.  4.  Appointment with Pedorthist for diabetic shoes and custom molded insoles  5.  Return to clinic in 3 mos. for routine foot care   Edrick Kins, DPM [facility]  Dr. Edrick Kins, DPM    [address], [place] [address]                Office [phone]  Fax [phone]

## 2021-04-25 ENCOUNTER — Encounter (INDEPENDENT_AMBULATORY_CARE_PROVIDER_SITE_OTHER): Payer: Medicare Other | Admitting: Ophthalmology

## 2021-04-25 ENCOUNTER — Other Ambulatory Visit: Payer: Self-pay

## 2021-04-25 DIAGNOSIS — H33302 Unspecified retinal break, left eye: Secondary | ICD-10-CM | POA: Diagnosis not present

## 2021-04-25 DIAGNOSIS — H35033 Hypertensive retinopathy, bilateral: Secondary | ICD-10-CM | POA: Diagnosis not present

## 2021-04-25 DIAGNOSIS — I10 Essential (primary) hypertension: Secondary | ICD-10-CM | POA: Diagnosis not present

## 2021-04-25 DIAGNOSIS — E113313 Type 2 diabetes mellitus with moderate nonproliferative diabetic retinopathy with macular edema, bilateral: Secondary | ICD-10-CM | POA: Diagnosis not present

## 2021-04-25 DIAGNOSIS — H43813 Vitreous degeneration, bilateral: Secondary | ICD-10-CM

## 2021-05-08 DIAGNOSIS — I10 Essential (primary) hypertension: Secondary | ICD-10-CM | POA: Diagnosis not present

## 2021-05-08 DIAGNOSIS — E1169 Type 2 diabetes mellitus with other specified complication: Secondary | ICD-10-CM | POA: Diagnosis not present

## 2021-05-08 DIAGNOSIS — R5382 Chronic fatigue, unspecified: Secondary | ICD-10-CM | POA: Diagnosis not present

## 2021-05-13 ENCOUNTER — Telehealth: Payer: Self-pay

## 2021-05-13 DIAGNOSIS — I270 Primary pulmonary hypertension: Secondary | ICD-10-CM | POA: Diagnosis not present

## 2021-05-13 DIAGNOSIS — E1165 Type 2 diabetes mellitus with hyperglycemia: Secondary | ICD-10-CM | POA: Diagnosis not present

## 2021-05-13 DIAGNOSIS — I82509 Chronic embolism and thrombosis of unspecified deep veins of unspecified lower extremity: Secondary | ICD-10-CM | POA: Diagnosis not present

## 2021-05-13 DIAGNOSIS — Z7901 Long term (current) use of anticoagulants: Secondary | ICD-10-CM | POA: Diagnosis not present

## 2021-05-13 NOTE — Telephone Encounter (Signed)
CMN Received - Shoes ordered, Orthofeet 844 40M

## 2021-05-19 ENCOUNTER — Telehealth: Payer: Self-pay

## 2021-05-19 NOTE — Telephone Encounter (Signed)
Casts sent to [employer] ?

## 2021-05-23 ENCOUNTER — Other Ambulatory Visit: Payer: Self-pay

## 2021-05-23 ENCOUNTER — Encounter (INDEPENDENT_AMBULATORY_CARE_PROVIDER_SITE_OTHER): Payer: Medicare Other | Admitting: Ophthalmology

## 2021-05-23 DIAGNOSIS — H33302 Unspecified retinal break, left eye: Secondary | ICD-10-CM | POA: Diagnosis not present

## 2021-05-23 DIAGNOSIS — H43813 Vitreous degeneration, bilateral: Secondary | ICD-10-CM

## 2021-05-23 DIAGNOSIS — H35033 Hypertensive retinopathy, bilateral: Secondary | ICD-10-CM

## 2021-05-23 DIAGNOSIS — E113313 Type 2 diabetes mellitus with moderate nonproliferative diabetic retinopathy with macular edema, bilateral: Secondary | ICD-10-CM | POA: Diagnosis not present

## 2021-05-23 DIAGNOSIS — H33102 Unspecified retinoschisis, left eye: Secondary | ICD-10-CM

## 2021-05-23 DIAGNOSIS — I10 Essential (primary) hypertension: Secondary | ICD-10-CM | POA: Diagnosis not present

## 2021-06-02 ENCOUNTER — Telehealth: Payer: Self-pay

## 2021-06-02 NOTE — Telephone Encounter (Signed)
Lvm for pt to call back to schedule shoe pick up.

## 2021-06-09 ENCOUNTER — Other Ambulatory Visit: Payer: Self-pay

## 2021-06-09 ENCOUNTER — Ambulatory Visit (INDEPENDENT_AMBULATORY_CARE_PROVIDER_SITE_OTHER): Payer: Medicare Other

## 2021-06-09 DIAGNOSIS — E0843 Diabetes mellitus due to underlying condition with diabetic autonomic (poly)neuropathy: Secondary | ICD-10-CM | POA: Diagnosis not present

## 2021-06-09 DIAGNOSIS — M2141 Flat foot [pes planus] (acquired), right foot: Secondary | ICD-10-CM | POA: Diagnosis not present

## 2021-06-09 DIAGNOSIS — M2142 Flat foot [pes planus] (acquired), left foot: Secondary | ICD-10-CM

## 2021-06-09 NOTE — Progress Notes (Signed)
SITUATION ?Reason for Visit: Fitting of Diabetic [place] ?Patient / Caregiver Report:  Patient is satisfied with fit and function of shoes and insoles. ? ?OBJECTIVE DATA: ?Patient History / Diagnosis:   ?  ICD-10-CM   ?1. Diabetes mellitus due to underlying condition with diabetic autonomic neuropathy, unspecified whether long term insulin use (HCC)  E08.43   ?  ?2. Pes planus of both feet  M21.41   ? M21.42   ?  ? ? ?Change in Status:   None ? ?ACTIONS PERFORMED: ?In-Person Delivery, patient was fit with: ?- 1x pair A5500 PDAC approved prefabricated Diabetic Shoes: [id number] 400 88M ?- 3x pair Q6761 PDAC approved vacuum formed custom diabetic insoles; [employer]: [id number] ? ?Shoes and insoles were verified for structural integrity and safety. Patient wore shoes and insoles in office. Skin was inspected and free of areas of concern after wearing shoes and inserts. Shoes and inserts fit properly. Patient / Caregiver provided with ferbal instruction and demonstration regarding donning, doffing, wear, care, proper fit, function, purpose, cleaning, and use of shoes and insoles ' and in all related precautions and risks and benefits regarding shoes and insoles. Patient / Caregiver was instructed to wear properly fitting socks with shoes at all times. Patient was also provided with verbal instruction regarding how to report any failures or malfunctions of shoes or inserts, and necessary follow up care. Patient / Caregiver was also instructed to contact physician regarding change in status that may affect function of shoes and inserts.  ? ?Patient / Caregiver verbalized undersatnding of instruction provided. Patient / Caregiver demonstrated independence with proper donning and doffing of shoes and inserts. ? ?PLAN ?Patient to follow with treating physician as recommended. Plan of care was discussed with and agreed upon by patient and/or caregiver. All questions were answered and concerns addressed. ? ?

## 2021-06-13 DIAGNOSIS — I270 Primary pulmonary hypertension: Secondary | ICD-10-CM | POA: Diagnosis not present

## 2021-06-13 DIAGNOSIS — E1165 Type 2 diabetes mellitus with hyperglycemia: Secondary | ICD-10-CM | POA: Diagnosis not present

## 2021-06-13 DIAGNOSIS — Z7901 Long term (current) use of anticoagulants: Secondary | ICD-10-CM | POA: Diagnosis not present

## 2021-06-13 DIAGNOSIS — I82509 Chronic embolism and thrombosis of unspecified deep veins of unspecified lower extremity: Secondary | ICD-10-CM | POA: Diagnosis not present

## 2021-06-19 ENCOUNTER — Encounter (INDEPENDENT_AMBULATORY_CARE_PROVIDER_SITE_OTHER): Payer: Medicare Other | Admitting: Ophthalmology

## 2021-06-19 DIAGNOSIS — H43813 Vitreous degeneration, bilateral: Secondary | ICD-10-CM

## 2021-06-19 DIAGNOSIS — E113313 Type 2 diabetes mellitus with moderate nonproliferative diabetic retinopathy with macular edema, bilateral: Secondary | ICD-10-CM | POA: Diagnosis not present

## 2021-06-19 DIAGNOSIS — H35033 Hypertensive retinopathy, bilateral: Secondary | ICD-10-CM

## 2021-06-19 DIAGNOSIS — I10 Essential (primary) hypertension: Secondary | ICD-10-CM | POA: Diagnosis not present

## 2021-07-07 DIAGNOSIS — I10 Essential (primary) hypertension: Secondary | ICD-10-CM | POA: Diagnosis not present

## 2021-07-07 DIAGNOSIS — E039 Hypothyroidism, unspecified: Secondary | ICD-10-CM | POA: Diagnosis not present

## 2021-07-07 DIAGNOSIS — I82509 Chronic embolism and thrombosis of unspecified deep veins of unspecified lower extremity: Secondary | ICD-10-CM | POA: Diagnosis not present

## 2021-07-07 DIAGNOSIS — E1169 Type 2 diabetes mellitus with other specified complication: Secondary | ICD-10-CM | POA: Diagnosis not present

## 2021-07-13 DIAGNOSIS — I270 Primary pulmonary hypertension: Secondary | ICD-10-CM | POA: Diagnosis not present

## 2021-07-13 DIAGNOSIS — E1165 Type 2 diabetes mellitus with hyperglycemia: Secondary | ICD-10-CM | POA: Diagnosis not present

## 2021-07-14 ENCOUNTER — Ambulatory Visit (INDEPENDENT_AMBULATORY_CARE_PROVIDER_SITE_OTHER): Payer: Medicare Other | Admitting: Podiatry

## 2021-07-14 DIAGNOSIS — M79675 Pain in left toe(s): Secondary | ICD-10-CM | POA: Diagnosis not present

## 2021-07-14 DIAGNOSIS — B351 Tinea unguium: Secondary | ICD-10-CM

## 2021-07-14 DIAGNOSIS — E0843 Diabetes mellitus due to underlying condition with diabetic autonomic (poly)neuropathy: Secondary | ICD-10-CM

## 2021-07-14 DIAGNOSIS — M79674 Pain in right toe(s): Secondary | ICD-10-CM | POA: Diagnosis not present

## 2021-07-14 NOTE — Progress Notes (Signed)
? ?  SUBJECTIVE Patient presents to office today complaining of elongated, thickened nails that cause pain while ambulating in shoes.  Patient is unable to trim their own nails.   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 since 1996   Insulin dependent diabetes mellitus    Mitral valve prolapse    Neuropathy    Osteoarthritis    bilateral knee   Seasonal allergies    Sleep apnea    no CPAP use in several months, per pt.   Systemic lupus erythematosus (HCC)    Trigger finger, left middle finger 01/2017    OBJECTIVE General Patient is awake, alert, and oriented x 3 and in no acute distress. Derm Skin is dry and supple bilateral. Negative open lesions or macerations. Remaining integument unremarkable. Nails are tender, long, thickened and dystrophic with subungual debris, consistent with onychomycosis, 1-5 bilateral. No signs of infection noted. Vasc  DP and PT pedal pulses palpable bilaterally. Temperature gradient within normal limits.  Neuro Epicritic and protective threshold sensation grossly intact bilaterally.  Musculoskeletal Exam pes planus deformity noted to the bilateral feet left greater than the right.  There is collapse of the medial longitudinal arch and tenderness along the posterior tibial tendon of the left specifically  ASSESSMENT 1.  Pain due to onychomycosis of toenails both 2.  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  3. PTTD LLE 4.  Diabetes mellitus with peripheral polyneuropathy  PLAN OF CARE 1. Patient evaluated today.  2. Instructed to maintain good pedal hygiene and foot  care.  3. Mechanical debridement of nails 1-5 bilaterally performed using a nail nipper. Filed with dremel without incident.  4.  Continue wearing diabetic shoes and insoles 5.  Return to clinic in 3 months for routine foot care   Essie Lagunes M. Janazia Schreier, DPM [facility]  Dr. Dajae Kizer M. Satomi Buda, DPM    [address]                                     [place], [place] [address]                Office [phone]  Fax [phone]    

## 2021-07-15 ENCOUNTER — Ambulatory Visit (INDEPENDENT_AMBULATORY_CARE_PROVIDER_SITE_OTHER): Payer: Medicare Other | Admitting: Physician Assistant

## 2021-07-15 ENCOUNTER — Encounter: Payer: Self-pay | Admitting: Physician Assistant

## 2021-07-15 DIAGNOSIS — G5601 Carpal tunnel syndrome, right upper limb: Secondary | ICD-10-CM | POA: Diagnosis not present

## 2021-07-15 DIAGNOSIS — G5603 Carpal tunnel syndrome, bilateral upper limbs: Secondary | ICD-10-CM

## 2021-07-15 NOTE — Progress Notes (Signed)
? ?Office Visit Note ?  ?[name]           ?Date of Birth: 18-Aug-1945           ?MRN: [id number] ?Visit Date: 07/15/2021 ?             ?Requested by: Lucianne Lei, MD ?[address] ?[address] ?[place],  [place] [address] ?PCP: Lucianne Lei, MD ? ?Chief Complaint  ?Patient presents with  ? Right Hand - Pain  ? Left Hand - Pain  ? ? ? ? ?HPI: ?Patient is a pleasant 76 year old woman who is a longtime patient of Dr. Durward Fortes.  He is done bilateral knee replacements on her a few years ago and is also cared for her husband.  She presents today because she was previously diagnosed by Dr. Ernestina Patches with carpal tunnel syndrome by EMG.  At the time she was caring for her husband and her grandchild and could not do surgery.  She is now presenting wanting to schedule carpal tunnel releases.  She is right-hand dominant and the right hand is more severe.  She says she has difficulty styling hair as well as putting on her jewelry and other activities of daily living because of the weakness in her hand.  She has numbness in her first second and part of her third finger.  Similar symptoms on the left but not as severe ? ?Assessment & Plan: ?Visit Diagnoses: No diagnosis found. ? ?Plan: Patient had EMGs in 2019 that demonstrated moderately severe carpal tunnel syndrome.  She feels this is significant enough that she would like to go forward with carpal tunnel release.  She is a diabetic and she believes her most recent hemoglobin A1c was 8.  She did have knee replacements and although one had to be revised because of a cement issue she did not have any infection.  She said she "heals well.  She also is on chronic anticoagulation therapy.  She has significant atrophy especially in the thenar eminence and I explained to her that surgery would be a bit less predictable.  Ultimately I will discuss this with Dr. Durward Fortes and see if he can call her and discuss surgery with her ? ?Follow-Up Instructions: No follow-ups on file.   ? ?Ortho Exam ? ?Patient is alert, oriented, no adenopathy, well-dressed, normal affect, normal respiratory effort. ?She has bilateral easily palpable pulses.  She has sensation changes bilaterally to the first second and third fingers.  Fourth and fifth are intact.  She does have a reproduction of symptoms with Tinel's testing.  She has significant right greater than left thenar muscle atrophy.  She has brisk capillary refill in her fingers ? ?Imaging: ?No results found. ?No images are attached to the encounter. ? ?Labs: ?Lab Results  ?Component Value Date  ? HGBA1C 8.8 (H) 01/31/2019  ? HGBA1C (H) 09/23/2007  ?  8.8 ?(NOTE)   The ADA recommends the following therapeutic goal for glycemic   control related to Hgb A1C measurement:   Goal of Therapy:   < 7.0% Hgb A1C   Reference: American Diabetes Association: Clinical Practice   Recommendations 2008, Diabetes Care,  ?2008, 31:(Suppl 1).  ? HGBA1C (H) 09/20/2007  ?  8.9 ?(NOTE)   The ADA recommends the following therapeutic goal for glycemic   control related to Hgb A1C measurement:   Goal of Therapy:   < 7.0% Hgb A1C   Reference: American Diabetes Association: Clinical Practice   Recommendations 2008, Diabetes Care,  ?2008,  31:(Suppl 1).  ? ESRSEDRATE 38 (H) 02/02/2020  ? ESRSEDRATE 39 (H) 12/20/2018  ? ESRSEDRATE 30 12/17/2016  ? CRP 16.8 (H) 02/02/2020  ? CRP 18.9 (H) 12/17/2016  ? REPTSTATUS 02/05/2019 FINAL 01/31/2019  ? REPTSTATUS 02/03/2019 FINAL 01/31/2019  ? GRAMSTAIN NO WBC SEEN ?NO ORGANISMS SEEN ? 01/31/2019  ? CULT  01/31/2019  ?  NO ANAEROBES ISOLATED ?Performed at [facility], [place] [address] [place], [place] [address] ?  ? CULT  01/31/2019  ?  NO GROWTH 3 DAYS ?Performed at [facility], [place] [address] [place],  [address] ?  ? ? ? ?Lab Results  ?Component Value Date  ? ALBUMIN 3.4 (L) 01/25/2019  ? ALBUMIN 3.5 11/09/2015  ? ALBUMIN 3.3 (L) 11/16/2013  ? ? ?No results found for: MG ?Lab Results  ?Component Value Date  ? VD25OH 29  (L) 03/14/2012  ? VD25OH (L) 09/20/2007  ?  7 ?(NOTE) This assay accurately quantifies Vitamin D, which is the sum of the 25-Hydroxy forms of Vitamin D2 and D3.  Studies have shown that the optimum concentration of 25-Hydroxy Vitamin D is 30 ng/mL or higher.  Concentrations of Vitamin D between 20  ?and 29 ng/mL are considered to be insufficient and concentrations less than 20 ng/mL are considered to be deficient for Vitamin D.  ? ? ?No results found for: PREALBUMIN ? ?  Latest Ref Rng & Units 02/02/2020  ?  8:49 AM 02/02/2019  ?  3:18 AM 02/01/2019  ?  4:51 AM  ?CBC EXTENDED  ?WBC 3.8 - 10.8 Thousand/uL 9.8   11.9   12.1    ?RBC 3.80 - 5.10 Million/uL 4.40   3.60   3.69    ?Hemoglobin 11.7 - 15.5 g/dL 11.9   10.0   10.1    ?HCT 35.0 - 45.0 % 38.3   32.0   32.4    ?Platelets 140 - 400 Thousand/uL 393   355   380    ?NEUT# 1,500 - 7,800 cells/uL 6,380      ?Lymph# 850 - 3,900 cells/uL 2,705      ? ? ? ?There is no height or weight on file to calculate BMI. ? ?Orders:  ?No orders of the defined types were placed in this encounter. ? ?No orders of the defined types were placed in this encounter. ? ? ? Procedures: ?No procedures performed ? ?Clinical Data: ?No additional findings. ? ?ROS: ? ?All other systems negative, except as noted in the HPI. ?Review of Systems ? ?Objective: ?Vital Signs: There were no vitals taken for this visit. ? ?Specialty Comments:  ?No specialty comments available. ? ?PMFS History: ?Patient Active Problem List  ? Diagnosis Date Noted  ? Pain in right knee 02/14/2019  ? S/P revision of total knee, right 01/31/2019  ? 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[place]) 12/20/2018  ? Sleep apnea 01/31/2018  ? Atrophic vaginitis 01/31/2018  ? Hyponatremia 01/31/2018  ? Increased body mass index 01/31/2018  ? Osteoarthritis 01/31/2018  ? Postmenopausal bleeding 01/31/2018  ? Systemic lupus erythematosus ([place]) 01/31/2018  ? Uterine leiomyoma 01/31/2018  ? Trigger middle finger of left hand  01/11/2017  ? 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 High risk medication use 01/06/2016  ? S/P TKR (total knee replacement), bilateral 01/06/2016  ? Osteoarthritis of hands, bilateral 01/06/2016  ? Vitamin D deficiency 01/06/2016  ? Deep venous thrombosis of lower extremity ([place]) 06/04/2015  ? Osteopenia 03/14/2012  ? Diabetes mellitus type 2  in obese ([place]) 03/14/2012  ? ASCVD (arteriosclerotic cardiovascular disease) 03/14/2012  ? Hypertension 03/14/2012  ? ?Past Medical History:  ?Diagnosis Date  ? Carpal tunnel syndrome   ? Clotting disorder ([place])   ? Cough 02/08/2017  ? Diabetic retinopathy ([place])   ? Discoid lupus   ? states currently in remission  ? Full dentures   ? GERD (gastroesophageal reflux disease)   ? Heart murmur   ? History of blood clots 1996  ? groin  ? History of glaucoma   ? had laser correction  ? Hypertension   ? states under control with meds., has been on med. since 1996  ? Insulin dependent diabetes mellitus   ? Mitral valve prolapse   ? Neuropathy   ? Osteoarthritis   ? bilateral knee  ? Seasonal allergies   ? Sleep apnea   ? no CPAP use in several months, per pt.  ? Systemic lupus erythematosus ([place])   ? Trigger finger, left middle finger 01/2017  ?  ?Family History  ?Problem Relation Age of Onset  ? Cancer Mother   ?     colon  ? Cancer Cousin   ?     lung  ? Diabetes Sister   ? Diabetes Sister   ? Diabetes Sister   ?  ?Past Surgical History:  ?Procedure Laterality Date  ? BACK SURGERY    ? lower - removed  cysts  ? CATARACT EXTRACTION W/ INTRAOCULAR LENS  IMPLANT, BILATERAL Bilateral   ? GLAUCOMA SURGERY Bilateral   ? laser  ? HEMILAMINOTOMY LUMBAR SPINE Right 01/30/2009  ? L5  ? JOINT REPLACEMENT    ? TOTAL KNEE ARTHROPLASTY Right 09/22/2007  ? TOTAL KNEE ARTHROPLASTY Left 02/01/2007  ? TOTAL KNEE REVISION Right 01/31/2019  ? Procedure: RIGHT TOTAL KNEE REVISION;  Surgeon: [name], MD;  [facility]: [facility];  Service: Orthopedics;  Laterality: Right;  ? TRIGGER FINGER RELEASE  Right 12/14/2013  ? Procedure: RELEASE TRIGGER FINGER/A-1 PULLEY RIGHT RING FINGER;  Surgeon: Daryll Brod, MD;  Location: [facility];  Service: Orthopedics;  Laterality: Right;  ? TRIGGER F

## 2021-07-21 ENCOUNTER — Telehealth: Payer: Self-pay | Admitting: Orthopaedic Surgery

## 2021-07-21 NOTE — Telephone Encounter (Signed)
Patient called needing to schedule surgery for for her right wrist. Patient said she left a message for Dr. Durward Fortes to call her concerning surgery. The number to contact patient is [phone] or [phone] ?

## 2021-07-22 NOTE — Telephone Encounter (Signed)
I think Bevely Palmer completed the surgery form-if not remind me

## 2021-07-24 ENCOUNTER — Encounter (INDEPENDENT_AMBULATORY_CARE_PROVIDER_SITE_OTHER): Payer: Medicare Other | Admitting: Ophthalmology

## 2021-07-24 DIAGNOSIS — H35379 Puckering of macula, unspecified eye: Secondary | ICD-10-CM | POA: Diagnosis not present

## 2021-07-24 DIAGNOSIS — H35033 Hypertensive retinopathy, bilateral: Secondary | ICD-10-CM

## 2021-07-24 DIAGNOSIS — H43813 Vitreous degeneration, bilateral: Secondary | ICD-10-CM

## 2021-07-24 DIAGNOSIS — I10 Essential (primary) hypertension: Secondary | ICD-10-CM

## 2021-07-24 DIAGNOSIS — E113313 Type 2 diabetes mellitus with moderate nonproliferative diabetic retinopathy with macular edema, bilateral: Secondary | ICD-10-CM | POA: Diagnosis not present

## 2021-07-24 DIAGNOSIS — H33302 Unspecified retinal break, left eye: Secondary | ICD-10-CM | POA: Diagnosis not present

## 2021-08-07 ENCOUNTER — Other Ambulatory Visit: Payer: Self-pay | Admitting: Orthopaedic Surgery

## 2021-08-07 DIAGNOSIS — G5601 Carpal tunnel syndrome, right upper limb: Secondary | ICD-10-CM | POA: Diagnosis not present

## 2021-08-07 MED ORDER — HYDROCODONE-ACETAMINOPHEN 5-325 MG PO TABS: 1.0000 | ORAL_TABLET | ORAL | 0 refills | Status: DC | PRN

## 2021-08-13 DIAGNOSIS — I270 Primary pulmonary hypertension: Secondary | ICD-10-CM | POA: Diagnosis not present

## 2021-08-13 DIAGNOSIS — E1165 Type 2 diabetes mellitus with hyperglycemia: Secondary | ICD-10-CM | POA: Diagnosis not present

## 2021-08-14 ENCOUNTER — Encounter: Payer: Self-pay | Admitting: Orthopaedic Surgery

## 2021-08-14 ENCOUNTER — Ambulatory Visit (INDEPENDENT_AMBULATORY_CARE_PROVIDER_SITE_OTHER): Payer: Medicare Other | Admitting: Orthopaedic Surgery

## 2021-08-14 DIAGNOSIS — G5601 Carpal tunnel syndrome, right upper limb: Secondary | ICD-10-CM

## 2021-08-14 DIAGNOSIS — E669 Obesity, unspecified: Secondary | ICD-10-CM

## 2021-08-14 DIAGNOSIS — E1169 Type 2 diabetes mellitus with other specified complication: Secondary | ICD-10-CM

## 2021-08-14 NOTE — Progress Notes (Signed)
Office Visit Note   [name]           Date of Birth: 1946/03/14           MRN: [id number] Visit Date: 08/14/2021              Requested by: Lucianne Lei, [address] [place],  [place] [address] PCP: Lucianne Lei, MD   Assessment & Plan: Visit Diagnoses:  1. Diabetes mellitus type 2 in obese (HCC)   2. Carpal tunnel syndrome, right upper limb     Plan: [name] is a week status post right carpal tunnel release via open approach.  She is doing well.  There is no fever or chills and wound looks nice and clean.  She is diabetic.  There is no swelling of her digits.  She has no change in sensation.  The preoperative EMG and nerve conduction studies performed in 2019 demonstrated severe carpal tunnel.  At the time of surgery the nerve is pale and the ends.  I suspect that if she has any significant recovery of the nerve it will take a long time.  At least for the moment there is no evidence of any problems.  She does have intrinsic atrophy but is able to oppose the thumb to little finger.  Apply waterproof Band-Aid and wear the volar wrist splint and return in a week to remove the stitches  Follow-Up Instructions: Return in about 1 week (around 08/21/2021).   Orders:  No orders of the defined types were placed in this encounter.  No orders of the defined types were placed in this encounter.     Procedures: No procedures performed   Clinical Data: No additional findings.   Subjective: Chief Complaint  Patient presents with   Right Wrist - Follow-up    Right carpal tunnel release 08/07/2021  Patient presents today for follow up on her right wrist. She had carpal tunnel release on 08/07/2021. She is now a month out from surgery. She is doing well. She takes hydrocodone for pain relief.   HPI  Review of Systems   Objective: Vital Signs: There were no vitals taken for this visit.  Physical Exam  Ortho Exam right hand carpal tunnel incision is  healing without a problem evidence of induration or redness and no drainage.  She is able to make a full fist.  Considerable atrophy and of the intrinsic musculature noted preoperatively but she is able to oppose thumb little finger.  Good capillary refill to the digits.  Does have some altered sensibility in the radial 3 digits which probably is unchanged from her preoperative status .not having any pain  Specialty Comments:  No specialty comments available.  Imaging: No results found.   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[place]) 12/20/2018   Sleep apnea 01/31/2018   Atrophic vaginitis 01/31/2018   Hyponatremia 01/31/2018   Increased body mass index 01/31/2018   Osteoarthritis 01/31/2018   Postmenopausal bleeding 01/31/2018   Systemic lupus erythematosus ([place]) 01/31/2018   Uterine leiomyoma 01/31/2018   Trigger middle finger of left hand 01/11/2017   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) 03/14/2012   ASCVD (arteriosclerotic cardiovascular disease) 03/14/2012   Hypertension 03/14/2012   Past Medical History:  Diagnosis Date   Carpal tunnel syndrome    Clotting disorder ([place])    Cough 02/08/2017   Diabetic retinopathy ([place])    Discoid lupus    states currently in remission   Full dentures    GERD (gastroesophageal reflux disease)    Heart murmur    History of blood clots 1996   groin   History of glaucoma    had laser correction   Hypertension     states under control with meds., has been on med. since 1996   Insulin dependent diabetes mellitus    Mitral valve prolapse    Neuropathy    Osteoarthritis    bilateral knee   Seasonal allergies    Sleep apnea    no CPAP use in several months, per pt.   Systemic lupus erythematosus (HCC)    Trigger finger, left middle finger 01/2017    Family History  Problem Relation Age of Onset   Cancer Mother        colon   Cancer Cousin        lung   Diabetes Sister    Diabetes Sister    Diabetes Sister     Past Surgical History:  Procedure Laterality Date   BACK SURGERY     lower - removed  cysts   CATARACT EXTRACTION W/ INTRAOCULAR LENS  IMPLANT, BILATERAL Bilateral    GLAUCOMA SURGERY Bilateral    laser   HEMILAMINOTOMY LUMBAR SPINE Right 01/30/2009   L5   JOINT REPLACEMENT     TOTAL KNEE ARTHROPLASTY Right 09/22/2007   TOTAL KNEE ARTHROPLASTY Left 02/01/2007   TOTAL KNEE REVISION Right 01/31/2019   Procedure: RIGHT TOTAL KNEE REVISION;  Surgeon: [name], MD;  [facility];  Service: Orthopedics;  Laterality: Right;   TRIGGER FINGER RELEASE Right 12/14/2013   Procedure: RELEASE TRIGGER FINGER/A-1 PULLEY RIGHT RING FINGER;  Surgeon: Daryll Brod, MD;  Location: [facility];  Service: Orthopedics;  Laterality: Right;   TRIGGER FINGER RELEASE Left 02/11/2017   Procedure: RELEASE TRIGGER FINGER/A-1 PULLEY;  Surgeon: Leanora Cover, MD;  Location: [facility];  Service: Orthopedics;  Laterality: Left;   Social History   Occupational History   Not on file  Tobacco Use   Smoking status: Never   Smokeless tobacco: Never  Vaping Use   Vaping Use: Never used  Substance and Sexual Activity   Alcohol use: No   Drug use: No   Sexual activity: Not on file

## 2021-08-21 ENCOUNTER — Ambulatory Visit (INDEPENDENT_AMBULATORY_CARE_PROVIDER_SITE_OTHER): Payer: Medicare Other | Admitting: Physician Assistant

## 2021-08-21 ENCOUNTER — Encounter: Payer: Self-pay | Admitting: Physician Assistant

## 2021-08-21 ENCOUNTER — Encounter (INDEPENDENT_AMBULATORY_CARE_PROVIDER_SITE_OTHER): Payer: Medicare Other | Admitting: Ophthalmology

## 2021-08-21 DIAGNOSIS — G5601 Carpal tunnel syndrome, right upper limb: Secondary | ICD-10-CM

## 2021-08-21 NOTE — Progress Notes (Signed)
Office Visit Note   [name]           Date of Birth: 1945-08-26           MRN: [id number] Visit Date: 08/21/2021              Requested by: Lucianne Lei, MD [address] [place],  [place] [address] PCP: Lucianne Lei, MD  Chief Complaint  Patient presents with   Right Hand - Routine Post Op      HPI: Patient is a pleasant 76 year old woman who is 2 weeks status post right open carpal tunnel release.  She reports she is doing well.  She thinks she has a little bit of sensation in her index finger.  She denies any fever or chills.  She has good pain control  Assessment & Plan: Visit Diagnoses: 2 weeks status post right carpal tunnel release.  Plan: She is doing quite well.  We will remove her sutures today and placed Steri-Strips.  She should continue to use the splint.  Should avoid lifting or doing any significant activity with her right hand.  We will follow-up with Dr. Durward Fortes in 2 weeks  Follow-Up Instructions: Return in about 2 weeks (around 09/04/2021).   Ortho Exam  Patient is alert, oriented, no adenopathy, well-dressed, normal affect, normal respiratory effort. Examination of her right hand she has a well-healing surgical incision with well apposed wound edges.  There is no swelling there is no drainage there is no surrounding erythema.  No induration.  She has some sensation in her index finger.  She can oppose all of her fingers without too much difficulty.  Imaging: No results found. No images are attached to the encounter.  Labs: Lab Results  Component Value Date   HGBA1C 8.8 (H) 01/31/2019   HGBA1C (H) 09/23/2007    8.8 (NOTE)   The ADA recommends the following therapeutic goal for glycemic   control related to Hgb A1C measurement:   Goal of Therapy:   < 7.0% Hgb A1C   Reference: American Diabetes Association: Clinical Practice   Recommendations 2008, Diabetes Care,  2008, 31:(Suppl 1).   HGBA1C (H) 09/20/2007    8.9 (NOTE)   The  ADA recommends the following therapeutic goal for glycemic   control related to Hgb A1C measurement:   Goal of Therapy:   < 7.0% Hgb A1C   Reference: American Diabetes Association: Clinical Practice   Recommendations 2008, Diabetes Care,  2008, 31:(Suppl 1).   ESRSEDRATE 38 (H) 02/02/2020   ESRSEDRATE 39 (H) 12/20/2018   ESRSEDRATE 30 12/17/2016   CRP 16.8 (H) 02/02/2020   CRP 18.9 (H) 12/17/2016   REPTSTATUS 02/05/2019 FINAL 01/31/2019   REPTSTATUS 02/03/2019 FINAL 01/31/2019   GRAMSTAIN NO WBC SEEN NO ORGANISMS SEEN  01/31/2019   CULT  01/31/2019    NO ANAEROBES ISOLATED Performed at [facility], [place] [address] [place], [place] [address]    CULT  01/31/2019    NO GROWTH 3 DAYS Performed at [facility] [address], [place], [place] [address]      Lab Results  Component Value Date   ALBUMIN 3.4 (L) 01/25/2019   ALBUMIN 3.5 11/09/2015   ALBUMIN 3.3 (L) 11/16/2013    No results found for: "MG" Lab Results  Component Value Date   VD25OH 29 (L) 03/14/2012   VD25OH (L) 09/20/2007    7 (NOTE) This assay accurately quantifies Vitamin D, which is the sum of  the 25-Hydroxy forms of Vitamin D2 and D3.  Studies have shown that the optimum concentration of 25-Hydroxy Vitamin D is 30 ng/mL or higher.  Concentrations of Vitamin D between 20  and 29 ng/mL are considered to be insufficient and concentrations less than 20 ng/mL are considered to be deficient for Vitamin D.    No results found for: "PREALBUMIN"    Latest Ref Rng & Units 02/02/2020    8:49 AM 02/02/2019    3:18 AM 02/01/2019    4:51 AM  CBC EXTENDED  WBC 3.8 - 10.8 Thousand/uL 9.8  11.9  12.1   RBC 3.80 - 5.10 Million/uL 4.40  3.60  3.69   Hemoglobin 11.7 - 15.5 g/dL 11.9  10.0  10.1   HCT 35.0 - 45.0 % 38.3  32.0  32.4   Platelets 140 - 400 Thousand/uL 393  355  380   NEUT# 1,500 - 7,800 cells/uL 6,380     Lymph# 850 - 3,900 cells/uL 2,705        There is no height or weight on file to  calculate BMI.  Orders:  No orders of the defined types were placed in this encounter.  No orders of the defined types were placed in this encounter.    Procedures: No procedures performed  Clinical Data: No additional findings.  ROS:  All other systems negative, except as noted in the HPI. Review of Systems  Objective: Vital Signs: There were no vitals taken for this visit.  Specialty Comments:  No specialty comments available.  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[place]) 12/20/2018   Sleep apnea 01/31/2018   Atrophic vaginitis 01/31/2018   Hyponatremia 01/31/2018   Increased body mass index 01/31/2018   Osteoarthritis 01/31/2018   Postmenopausal bleeding 01/31/2018   Systemic lupus erythematosus ([place]) 01/31/2018   Uterine leiomyoma 01/31/2018   Trigger middle finger of left hand 01/11/2017   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   High risk medication use 01/06/2016   S/P TKR (total knee replacement), bilateral 01/06/2016   Osteoarthritis of hands, bilateral 01/06/2016   Vitamin D deficiency 01/06/2016   Deep venous thrombosis of lower extremity ([place]) 06/04/2015   Osteopenia 03/14/2012   Diabetes mellitus type 2 in obese ([place]) 03/14/2012   ASCVD (arteriosclerotic cardiovascular disease) 03/14/2012   Hypertension 03/14/2012   Past Medical History:  Diagnosis Date   Carpal tunnel syndrome    Clotting disorder ([place])    Cough 02/08/2017   Diabetic retinopathy ([place])    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  since 1996   Insulin dependent diabetes mellitus    Mitral valve prolapse    Neuropathy    Osteoarthritis    bilateral knee   Seasonal allergies    Sleep apnea    no CPAP use in several months, per pt.   Systemic lupus erythematosus (HCC)    Trigger finger, left middle finger 01/2017    Family History  Problem Relation Age of  Onset   Cancer Mother        colon   Cancer Cousin        lung   Diabetes Sister    Diabetes Sister    Diabetes Sister     Past Surgical History:  Procedure Laterality Date   BACK SURGERY     lower - removed  cysts   CATARACT EXTRACTION W/ INTRAOCULAR LENS  IMPLANT, BILATERAL Bilateral    GLAUCOMA SURGERY Bilateral    laser   HEMILAMINOTOMY LUMBAR SPINE Right 01/30/2009   L5   JOINT REPLACEMENT     TOTAL KNEE ARTHROPLASTY Right 09/22/2007   TOTAL KNEE ARTHROPLASTY Left 02/01/2007   TOTAL KNEE REVISION Right 01/31/2019   Procedure: RIGHT TOTAL KNEE REVISION;  Surgeon: [name], MD;  [facility];  Service: Orthopedics;  Laterality: Right;   TRIGGER FINGER RELEASE Right 12/14/2013   Procedure: RELEASE TRIGGER FINGER/A-1 PULLEY RIGHT RING FINGER;  Surgeon: Daryll Brod, MD;  Location: [facility];  Service: Orthopedics;  Laterality: Right;   TRIGGER FINGER RELEASE Left 02/11/2017   Procedure: RELEASE TRIGGER FINGER/A-1 PULLEY;  Surgeon: Leanora Cover, MD;  Location: [facility];  Service: Orthopedics;  Laterality: Left;   Social History   Occupational History   Not on file  Tobacco Use   Smoking status: Never   Smokeless tobacco: Never  Vaping Use   Vaping Use: Never used  Substance and Sexual Activity   Alcohol use: No   Drug use: No   Sexual activity: Not on file

## 2021-08-26 ENCOUNTER — Encounter (INDEPENDENT_AMBULATORY_CARE_PROVIDER_SITE_OTHER): Payer: Medicare Other | Admitting: Ophthalmology

## 2021-08-26 DIAGNOSIS — H33302 Unspecified retinal break, left eye: Secondary | ICD-10-CM | POA: Diagnosis not present

## 2021-08-26 DIAGNOSIS — H35033 Hypertensive retinopathy, bilateral: Secondary | ICD-10-CM | POA: Diagnosis not present

## 2021-08-26 DIAGNOSIS — H43813 Vitreous degeneration, bilateral: Secondary | ICD-10-CM | POA: Diagnosis not present

## 2021-08-26 DIAGNOSIS — E113392 Type 2 diabetes mellitus with moderate nonproliferative diabetic retinopathy without macular edema, left eye: Secondary | ICD-10-CM | POA: Diagnosis not present

## 2021-08-26 DIAGNOSIS — E113311 Type 2 diabetes mellitus with moderate nonproliferative diabetic retinopathy with macular edema, right eye: Secondary | ICD-10-CM

## 2021-08-26 DIAGNOSIS — I10 Essential (primary) hypertension: Secondary | ICD-10-CM

## 2021-09-04 ENCOUNTER — Ambulatory Visit (INDEPENDENT_AMBULATORY_CARE_PROVIDER_SITE_OTHER): Payer: Medicare Other | Admitting: Orthopaedic Surgery

## 2021-09-04 ENCOUNTER — Encounter: Payer: Self-pay | Admitting: Orthopaedic Surgery

## 2021-09-04 DIAGNOSIS — G5602 Carpal tunnel syndrome, left upper limb: Secondary | ICD-10-CM

## 2021-09-04 DIAGNOSIS — G5601 Carpal tunnel syndrome, right upper limb: Secondary | ICD-10-CM

## 2021-09-04 NOTE — Progress Notes (Addendum)
Office Visit Note   [name]           Date of Birth: [birth year]           MRN: [id number] Visit Date: 09/04/2021              Requested by: Renaye Rakers, MD [address] [place],  [place] [address] PCP: Renaye Rakers, MD   Assessment & Plan: Visit Diagnoses:  1. Carpal tunnel syndrome, right upper limb   2. Carpal tunnel syndrome, left upper limb     Plan: Mrs. Salaiz is 1 month status post right carpal tunnel release and doing well.  She notes that she does not have to wake up at night and shake her hand.  She still has numbness at the tip of her index finger which was present preoperatively.  She had severe carpal tunnel compression per prior EMGs and nerve conduction studies with axonal demyelination.  There certainly is a good chance that she will not have much return of sensation but only she is feeling better in terms of pain.  There is no swelling of her fingers and she is able to make a full fist.  There was considerable atrophy of the thenar musculature preoperatively.  Stitches were removed.  Recently she no longer needs the splint.  Like to check her back in approximately a month.  She would like to proceed with left carpal tunnel release sometime in the next several months. Nerve conduction studies and EMGs were performed in October 2019 by Dr. 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  On the left there was a severe nerve entrapment at the wrist i.e. carpal tunnel affecting sensory and motor components.  There was no significant electrodiagnostic evidence of any other focal nerve entrapment  Follow-Up Instructions: Return We will schedule left carpal tunnel release.   Orders:  No orders of the defined types were placed in this encounter.  No orders of the defined types were placed in this  encounter.     Procedures: No procedures performed   Clinical Data: No additional findings.   Subjective: Chief Complaint  Patient presents with   Right Hand - Routine Post Op   Patient presents today s/p Right Carpal Tunnel Release preformed on 08/07/2021. She states that she has been having continued numbness in her right index finger. She is wearing a velcro wrist brace at this time and will only have aching pains with overuse of her right hand.   Review of Systems   Objective: Vital Signs: There were no vitals taken for this visit.  Physical Exam  Ortho Exam awake alert and comfortable.  No distress.  Right carpal tunnel incision is healed without problem.  Considerable atrophy of the thenar musculature.  Able to oppose thumb to little finger.  Still has some subjective decrease sensation of the index finger all of the above was present preoperatively.  No swelling of the digits.  Able to make a full fist and release.  Specialty Comments:  No specialty comments available.  Imaging: No results found.   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) 12/20/2018   Sleep apnea 01/31/2018   Atrophic vaginitis 01/31/2018   Hyponatremia 01/31/2018   Increased body mass index 01/31/2018   Osteoarthritis 01/31/2018   Postmenopausal bleeding 01/31/2018   Systemic lupus erythematosus (HCC) 01/31/2018   Uterine leiomyoma 01/31/2018   Trigger middle finger of left hand 01/11/2017   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   High risk medication use 01/06/2016   S/P TKR (total knee replacement), bilateral 01/06/2016   Osteoarthritis of hands, bilateral 01/06/2016   Vitamin D deficiency 01/06/2016   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 since 1996   Insulin dependent diabetes mellitus    Mitral valve prolapse    Neuropathy    Osteoarthritis    bilateral knee   Seasonal allergies    Sleep apnea    no CPAP use in several months, per pt.   Systemic lupus erythematosus (HCC)    Trigger finger, left middle finger 01/2017    Family History  Problem Relation Age of Onset   Cancer Mother        colon   Cancer Cousin        lung   Diabetes Sister    Diabetes Sister    Diabetes Sister     Past Surgical History:  Procedure Laterality Date   BACK SURGERY     lower - removed  cysts   CATARACT EXTRACTION W/ INTRAOCULAR LENS  IMPLANT, BILATERAL Bilateral    GLAUCOMA SURGERY Bilateral    laser   HEMILAMINOTOMY LUMBAR SPINE Right 01/30/2009   L5   JOINT REPLACEMENT     TOTAL KNEE ARTHROPLASTY Right 09/22/2007   TOTAL KNEE ARTHROPLASTY Left 02/01/2007   TOTAL KNEE REVISION Right 01/31/2019   Procedure: RIGHT TOTAL KNEE REVISION;  Surgeon: Valeria Batman, MD;  Location: [facility];  Service: Orthopedics;  Laterality: Right;   TRIGGER FINGER RELEASE Right 12/14/2013   Procedure: RELEASE TRIGGER FINGER/A-1 PULLEY RIGHT RING FINGER;  Surgeon: Cindee Salt, MD;  Location: [facility];  Service: Orthopedics;  Laterality: Right;   TRIGGER FINGER RELEASE Left 02/11/2017   Procedure:  RELEASE TRIGGER FINGER/A-1 PULLEY;  Surgeon: Betha Loa, MD;  Location: [facility];  Service: Orthopedics;  Laterality: Left;   Social History   Occupational History   Not on file  Tobacco Use   Smoking status: Never   Smokeless tobacco: Never  Vaping Use   Vaping Use: Never used  Substance and Sexual Activity   Alcohol use: No   Drug use: No   Sexual activity: Not on file

## 2021-09-12 DIAGNOSIS — E1165 Type 2 diabetes mellitus with hyperglycemia: Secondary | ICD-10-CM | POA: Diagnosis not present

## 2021-09-12 DIAGNOSIS — I270 Primary pulmonary hypertension: Secondary | ICD-10-CM | POA: Diagnosis not present

## 2021-09-23 ENCOUNTER — Encounter (INDEPENDENT_AMBULATORY_CARE_PROVIDER_SITE_OTHER): Payer: Medicare Other | Admitting: Ophthalmology

## 2021-09-26 ENCOUNTER — Encounter (INDEPENDENT_AMBULATORY_CARE_PROVIDER_SITE_OTHER): Payer: Medicare Other | Admitting: Ophthalmology

## 2021-09-26 DIAGNOSIS — H35033 Hypertensive retinopathy, bilateral: Secondary | ICD-10-CM

## 2021-09-26 DIAGNOSIS — I10 Essential (primary) hypertension: Secondary | ICD-10-CM

## 2021-09-26 DIAGNOSIS — H33302 Unspecified retinal break, left eye: Secondary | ICD-10-CM

## 2021-09-26 DIAGNOSIS — H43813 Vitreous degeneration, bilateral: Secondary | ICD-10-CM

## 2021-09-26 DIAGNOSIS — E113311 Type 2 diabetes mellitus with moderate nonproliferative diabetic retinopathy with macular edema, right eye: Secondary | ICD-10-CM | POA: Diagnosis not present

## 2021-09-26 DIAGNOSIS — E113392 Type 2 diabetes mellitus with moderate nonproliferative diabetic retinopathy without macular edema, left eye: Secondary | ICD-10-CM | POA: Diagnosis not present

## 2021-10-14 DIAGNOSIS — I10 Essential (primary) hypertension: Secondary | ICD-10-CM | POA: Diagnosis not present

## 2021-10-14 DIAGNOSIS — E1169 Type 2 diabetes mellitus with other specified complication: Secondary | ICD-10-CM | POA: Diagnosis not present

## 2021-10-14 DIAGNOSIS — Z409 Encounter for prophylactic surgery, unspecified: Secondary | ICD-10-CM | POA: Diagnosis not present

## 2021-10-14 DIAGNOSIS — I82409 Acute embolism and thrombosis of unspecified deep veins of unspecified lower extremity: Secondary | ICD-10-CM | POA: Diagnosis not present

## 2021-10-14 DIAGNOSIS — M130 Polyarthritis, unspecified: Secondary | ICD-10-CM | POA: Diagnosis not present

## 2021-10-15 ENCOUNTER — Ambulatory Visit: Payer: Medicare Other | Admitting: Podiatry

## 2021-10-24 ENCOUNTER — Encounter (INDEPENDENT_AMBULATORY_CARE_PROVIDER_SITE_OTHER): Payer: Medicare Other | Admitting: Ophthalmology

## 2021-10-24 DIAGNOSIS — E113311 Type 2 diabetes mellitus with moderate nonproliferative diabetic retinopathy with macular edema, right eye: Secondary | ICD-10-CM | POA: Diagnosis not present

## 2021-10-24 DIAGNOSIS — H35033 Hypertensive retinopathy, bilateral: Secondary | ICD-10-CM

## 2021-10-24 DIAGNOSIS — H33302 Unspecified retinal break, left eye: Secondary | ICD-10-CM | POA: Diagnosis not present

## 2021-10-24 DIAGNOSIS — E113392 Type 2 diabetes mellitus with moderate nonproliferative diabetic retinopathy without macular edema, left eye: Secondary | ICD-10-CM | POA: Diagnosis not present

## 2021-10-24 DIAGNOSIS — H43813 Vitreous degeneration, bilateral: Secondary | ICD-10-CM | POA: Diagnosis not present

## 2021-10-24 DIAGNOSIS — I10 Essential (primary) hypertension: Secondary | ICD-10-CM | POA: Diagnosis not present

## 2021-10-29 ENCOUNTER — Ambulatory Visit (INDEPENDENT_AMBULATORY_CARE_PROVIDER_SITE_OTHER): Payer: Medicare Other | Admitting: Podiatry

## 2021-10-29 DIAGNOSIS — M79674 Pain in right toe(s): Secondary | ICD-10-CM | POA: Diagnosis not present

## 2021-10-29 DIAGNOSIS — B351 Tinea unguium: Secondary | ICD-10-CM

## 2021-10-29 DIAGNOSIS — M79675 Pain in left toe(s): Secondary | ICD-10-CM

## 2021-10-29 NOTE — Progress Notes (Signed)
   SUBJECTIVE Patient presents to office today complaining of elongated, thickened nails that cause pain while ambulating in shoes.  Patient is unable to trim their own nails.   Past Medical History:  Diagnosis Date   Carpal tunnel syndrome    Clotting disorder ([place])    Cough 02/08/2017   Diabetic retinopathy ([place])    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 since 1996   Insulin dependent diabetes mellitus    Mitral valve prolapse    Neuropathy    Osteoarthritis    bilateral knee   Seasonal allergies    Sleep apnea    no CPAP use in several months, per pt.   Systemic lupus erythematosus (HCC)    Trigger finger, left middle finger 01/2017    OBJECTIVE General Patient is awake, alert, and oriented x 3 and in no acute distress. Derm Skin is dry and supple bilateral. Negative open lesions or macerations. Remaining integument unremarkable. Nails are tender, long, thickened and dystrophic with subungual debris, consistent with onychomycosis, 1-5 bilateral. No signs of infection noted. Vasc  DP and PT pedal pulses palpable bilaterally. Temperature gradient within normal limits.  Neuro Epicritic and protective threshold sensation grossly intact bilaterally.  Musculoskeletal Exam pes planus deformity noted to the bilateral feet left greater than the right.  There is collapse of the medial longitudinal arch and tenderness along the posterior tibial tendon of the left specifically  ASSESSMENT 1.  Pain due to onychomycosis of toenails both 2.  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  3. PTTD LLE 4.  Diabetes mellitus with peripheral polyneuropathy  PLAN OF CARE 1. Patient evaluated today.  2. Instructed to maintain good pedal hygiene and foot  care.  3. Mechanical debridement of nails 1-5 bilaterally performed using a nail nipper. Filed with dremel without incident.  4.  Continue wearing diabetic shoes and insoles 5.  Return to clinic in 3 months for routine foot care   Edrick Kins, DPM [facility]  Dr. Edrick Kins, DPM    [address], [place] [address]                Office [phone]  Fax [phone]

## 2021-11-06 ENCOUNTER — Other Ambulatory Visit: Payer: Self-pay | Admitting: Orthopaedic Surgery

## 2021-11-06 DIAGNOSIS — G5602 Carpal tunnel syndrome, left upper limb: Secondary | ICD-10-CM | POA: Diagnosis not present

## 2021-11-06 MED ORDER — HYDROCODONE-ACETAMINOPHEN 5-325 MG PO TABS: 1.0000 | ORAL_TABLET | Freq: Four times a day (QID) | ORAL | 0 refills | Status: DC | PRN

## 2021-11-13 ENCOUNTER — Ambulatory Visit (INDEPENDENT_AMBULATORY_CARE_PROVIDER_SITE_OTHER): Payer: Medicare Other | Admitting: Orthopaedic Surgery

## 2021-11-13 ENCOUNTER — Encounter: Payer: Self-pay | Admitting: Orthopaedic Surgery

## 2021-11-13 DIAGNOSIS — M6530 Trigger finger, unspecified finger: Secondary | ICD-10-CM | POA: Insufficient documentation

## 2021-11-13 DIAGNOSIS — M65321 Trigger finger, right index finger: Secondary | ICD-10-CM | POA: Diagnosis not present

## 2021-11-13 DIAGNOSIS — G5602 Carpal tunnel syndrome, left upper limb: Secondary | ICD-10-CM

## 2021-11-13 MED ORDER — METHYLPREDNISOLONE ACETATE 40 MG/ML IJ SUSP
10.0000 mg | INTRAMUSCULAR | Status: AC | PRN
  Administered 2021-11-13: 10 mg

## 2021-11-13 MED ORDER — LIDOCAINE HCL 1 % IJ SOLN
0.5000 mL | INTRAMUSCULAR | Status: AC | PRN
  Administered 2021-11-13: .5 mL

## 2021-11-13 NOTE — Progress Notes (Signed)
Office Visit Note   [name]           Date of Birth: Sep 20, 1945           MRN: [id number] Visit Date: 11/13/2021              Requested by: Lucianne Lei, [address] [place],  [place] [address] PCP: Lucianne Lei, MD   Assessment & Plan: Visit Diagnoses:  1. Carpal tunnel syndrome, left upper limb   2. Trigger index finger of right hand     Plan: Kaitlyn Jackson is 1 week status post left carpal tunnel release and doing very well.  She has been "better feeling in my fingers".  The wound is healing without a problem.  We will apply waterproof bandage and have her return in a week to remove the stitches.  She will continue with the splint.  I also injected a right index trigger finger today and will monitor that as well over time  Follow-Up Instructions: Return in about 1 week (around 11/20/2021).   Orders:  No orders of the defined types were placed in this encounter.  No orders of the defined types were placed in this encounter.     Procedures: Hand/UE Inj: R index A1 for trigger finger on 11/13/2021 10:40 AM Medications: 0.5 mL lidocaine 1 %; 10 mg methylPREDNISolone acetate 40 MG/ML      Clinical Data: No additional findings.   Subjective: Chief Complaint  Patient presents with   Left Hand - Routine Post Op   Patient presents today s/p L CTR preformed on 11/06/2021. Patient states that she has noticed slight sharp pains in her left wrist. At this time patient has been taking hydrocodone for pain and is currently wearing a left velcro wrist brace. She would also like to discuss if she is able to have a right index trigger finger injection.   Review of Systems   Objective: Vital Signs: There were no vitals taken for this visit.  Physical Exam Constitutional:      Appearance: She is well-developed.  Pulmonary:     Effort: Pulmonary effort is normal.  Skin:    General: Skin is warm and dry.  Neurological:     Mental Status: She is  alert and oriented to person, place, and time.  Psychiatric:        Behavior: Behavior normal.     Ortho Exam left hand carpal tunnel incision healing without problem.  No evidence of infection.  Full range of motion of her digits without swelling.  Good capillary refill.  Still has a little tingling at the tips.  Active triggering of the right index finger with some discomfort on the palmar aspect near the metacarpal phalangeal joint i.e. A1 pulley no swelling of the digit.  Neurologically intact  Specialty Comments:  No specialty comments available.  Imaging: No results found.   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[place]) 12/20/2018   Sleep apnea 01/31/2018   Atrophic vaginitis 01/31/2018   Hyponatremia 01/31/2018   Increased body mass index 01/31/2018   Osteoarthritis 01/31/2018   Postmenopausal bleeding 01/31/2018   Systemic lupus erythematosus (  [place]) 01/31/2018   Uterine leiomyoma 01/31/2018   Trigger middle finger of left hand 01/11/2017   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   High risk medication use 01/06/2016   S/P TKR (total knee replacement), bilateral 01/06/2016   Osteoarthritis of hands, bilateral 01/06/2016   Vitamin D deficiency 01/06/2016   Deep venous thrombosis of lower extremity ([place]) 06/04/2015   Osteopenia 03/14/2012   Diabetes mellitus type 2 in obese ([place]) 03/14/2012   ASCVD (arteriosclerotic cardiovascular disease) 03/14/2012   Hypertension 03/14/2012   Past Medical History:  Diagnosis Date   Carpal tunnel syndrome    Clotting disorder ([place])    Cough 02/08/2017   Diabetic retinopathy ([place])    Discoid lupus    states currently in  remission   Full dentures    GERD (gastroesophageal reflux disease)    Heart murmur    History of blood clots 1996   groin   History of glaucoma    had laser correction   Hypertension    states under control with meds., has been on med. since 1996   Insulin dependent diabetes mellitus    Mitral valve prolapse    Neuropathy    Osteoarthritis    bilateral knee   Seasonal allergies    Sleep apnea    no CPAP use in several months, per pt.   Systemic lupus erythematosus (HCC)    Trigger finger, left middle finger 01/2017    Family History  Problem Relation Age of Onset   Cancer Mother        colon   Cancer Cousin        lung   Diabetes Sister    Diabetes Sister    Diabetes Sister     Past Surgical History:  Procedure Laterality Date   BACK SURGERY     lower - removed  cysts   CATARACT EXTRACTION W/ INTRAOCULAR LENS  IMPLANT, BILATERAL Bilateral    GLAUCOMA SURGERY Bilateral    laser   HEMILAMINOTOMY LUMBAR SPINE Right 01/30/2009   L5   JOINT REPLACEMENT     TOTAL KNEE ARTHROPLASTY Right 09/22/2007   TOTAL KNEE ARTHROPLASTY Left 02/01/2007   TOTAL KNEE REVISION Right 01/31/2019   Procedure: RIGHT TOTAL KNEE REVISION;  Surgeon: [name], MD;  Location: [facility];  Service: Orthopedics;  Laterality: Right;   TRIGGER FINGER RELEASE Right 12/14/2013   Procedure: RELEASE TRIGGER FINGER/A-1 PULLEY RIGHT RING FINGER;  Surgeon: Daryll Brod, MD;  Location: [facility];  Service: Orthopedics;  Laterality: Right;   TRIGGER FINGER RELEASE Left 02/11/2017   Procedure: RELEASE TRIGGER FINGER/A-1 PULLEY;  Surgeon: Leanora Cover, MD;  Location: [facility];  Service: Orthopedics;  Laterality: Left;   Social History   Occupational History   Not on file  Tobacco Use   Smoking status: Never   Smokeless tobacco: Never  Vaping Use   Vaping Use: Never used  Substance and Sexual Activity   Alcohol use: No   Drug use: No   Sexual activity: Not on file

## 2021-11-20 ENCOUNTER — Encounter: Payer: Self-pay | Admitting: Orthopaedic Surgery

## 2021-11-20 ENCOUNTER — Ambulatory Visit (INDEPENDENT_AMBULATORY_CARE_PROVIDER_SITE_OTHER): Payer: Medicare Other | Admitting: Orthopaedic Surgery

## 2021-11-20 DIAGNOSIS — G5602 Carpal tunnel syndrome, left upper limb: Secondary | ICD-10-CM

## 2021-11-20 NOTE — Progress Notes (Signed)
Office Visit Note   [name]           Date of Birth: 08-May-1945           MRN: [id number] Visit Date: 11/20/2021              Requested by: Lucianne Lei, [address] [place],  [place] [address] PCP: Lucianne Lei, MD   Assessment & Plan: Visit Diagnoses:  1. Carpal tunnel syndrome, left upper limb     Plan: 2 weeks status post left carpal tunnel release and doing very well.  Incision has healed without problem and stitches removed.  She will continue with the splint for few more weeks.  Almyra Free can tell is a difference in her hand with less pain and better feeling  Follow-Up Instructions: Return in about 2 weeks (around 12/04/2021).   Orders:  No orders of the defined types were placed in this encounter.  No orders of the defined types were placed in this encounter.     Procedures: No procedures performed   Clinical Data: No additional findings.   Subjective: Chief Complaint  Patient presents with   Left Hand - Routine Post Op   Patient presents today for a follow up of her left carpal tunnel release. She states that she received a injection in her right index finger on 11/13/2021 and since then she has had no locking in her finger. At this time she states that she is currently taking hydrocodone for pain and wearing velcro wrist brace.   Review of Systems   Objective: Vital Signs: There were no vitals taken for this visit.  Physical Exam  Ortho Exam left carpal tunnel incision healing without problem.  Stitches removed.  Able to easily oppose thumb to little finger.  No swelling.  Full range of motion of her fingers.  Good sensibility  Specialty Comments:  No specialty comments available.  Imaging: No results found.   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) 12/20/2018   Sleep apnea 01/31/2018   Atrophic vaginitis 01/31/2018   Hyponatremia 01/31/2018   Increased body mass index 01/31/2018   Osteoarthritis 01/31/2018   Postmenopausal bleeding 01/31/2018   Systemic lupus erythematosus ([place]) 01/31/2018   Uterine leiomyoma 01/31/2018   Trigger middle finger of left hand 01/11/2017   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)    Cough 02/08/2017   Diabetic retinopathy ([place])    Discoid lupus    states currently in remission   Full dentures    GERD (gastroesophageal reflux disease)    Heart murmur    History of blood clots 1996   groin   History of glaucoma  had laser correction   Hypertension    states under control with meds., has been on med. since 1996   Insulin dependent diabetes mellitus    Mitral valve prolapse    Neuropathy    Osteoarthritis    bilateral knee   Seasonal allergies    Sleep apnea    no CPAP use in several months, per pt.   Systemic lupus erythematosus (HCC)    Trigger finger, left middle finger 01/2017    Family History  Problem Relation Age of Onset   Cancer Mother        colon   Cancer Cousin        lung   Diabetes Sister    Diabetes Sister    Diabetes Sister     Past Surgical History:  Procedure Laterality Date   BACK SURGERY     lower - removed  cysts   CATARACT  EXTRACTION W/ INTRAOCULAR LENS  IMPLANT, BILATERAL Bilateral    GLAUCOMA SURGERY Bilateral    laser   HEMILAMINOTOMY LUMBAR SPINE Right 01/30/2009   L5   JOINT REPLACEMENT     TOTAL KNEE ARTHROPLASTY Right 09/22/2007   TOTAL KNEE ARTHROPLASTY Left 02/01/2007   TOTAL KNEE REVISION Right 01/31/2019   Procedure: RIGHT TOTAL KNEE REVISION;  Surgeon: [name], MD;  Location: [facility];  Service: Orthopedics;  Laterality: Right;   TRIGGER FINGER RELEASE Right 12/14/2013   Procedure: RELEASE TRIGGER FINGER/A-1 PULLEY RIGHT RING FINGER;  Surgeon: Daryll Brod, MD;  Location: [facility];  Service: Orthopedics;  Laterality: Right;   TRIGGER FINGER RELEASE Left 02/11/2017   Procedure: RELEASE TRIGGER FINGER/A-1 PULLEY;  Surgeon: Leanora Cover, MD;  Location: [facility];  Service: Orthopedics;  Laterality: Left;   Social History   Occupational History   Not on file  Tobacco Use   Smoking status: Never   Smokeless tobacco: Never  Vaping Use   Vaping Use: Never used  Substance and Sexual Activity   Alcohol use: No   Drug use: No   Sexual activity: Not on file

## 2021-11-21 ENCOUNTER — Encounter (INDEPENDENT_AMBULATORY_CARE_PROVIDER_SITE_OTHER): Payer: Medicare Other | Admitting: Ophthalmology

## 2021-11-24 ENCOUNTER — Encounter (INDEPENDENT_AMBULATORY_CARE_PROVIDER_SITE_OTHER): Payer: Medicare Other | Admitting: Ophthalmology

## 2021-11-26 ENCOUNTER — Encounter (INDEPENDENT_AMBULATORY_CARE_PROVIDER_SITE_OTHER): Payer: Medicare Other | Admitting: Ophthalmology

## 2021-11-26 DIAGNOSIS — H35372 Puckering of macula, left eye: Secondary | ICD-10-CM

## 2021-11-26 DIAGNOSIS — E113311 Type 2 diabetes mellitus with moderate nonproliferative diabetic retinopathy with macular edema, right eye: Secondary | ICD-10-CM

## 2021-11-26 DIAGNOSIS — H35033 Hypertensive retinopathy, bilateral: Secondary | ICD-10-CM

## 2021-11-26 DIAGNOSIS — H43813 Vitreous degeneration, bilateral: Secondary | ICD-10-CM | POA: Diagnosis not present

## 2021-11-26 DIAGNOSIS — H33302 Unspecified retinal break, left eye: Secondary | ICD-10-CM | POA: Diagnosis not present

## 2021-11-26 DIAGNOSIS — I10 Essential (primary) hypertension: Secondary | ICD-10-CM | POA: Diagnosis not present

## 2021-11-26 DIAGNOSIS — E113292 Type 2 diabetes mellitus with mild nonproliferative diabetic retinopathy without macular edema, left eye: Secondary | ICD-10-CM | POA: Diagnosis not present

## 2021-12-04 ENCOUNTER — Ambulatory Visit (INDEPENDENT_AMBULATORY_CARE_PROVIDER_SITE_OTHER): Payer: Medicare Other | Admitting: Orthopaedic Surgery

## 2021-12-04 ENCOUNTER — Encounter: Payer: Self-pay | Admitting: Orthopaedic Surgery

## 2021-12-04 DIAGNOSIS — G5602 Carpal tunnel syndrome, left upper limb: Secondary | ICD-10-CM

## 2021-12-04 NOTE — Progress Notes (Signed)
Office Visit Note   [name]           Date of Birth: 1945-10-17           MRN: [id number] Visit Date: 12/04/2021              Requested by: Lucianne Lei, [address] [place],  [place] [address] PCP: Lucianne Lei, MD   Assessment & Plan: Visit Diagnoses:  1. Carpal tunnel syndrome, left upper limb     Plan: Ms. Steinkamp is a pleasant 76 year old woman who is a month status post left carpal tunnel release.  She had a similar procedure on her right hand in the spring and did well.  She has been using some lotion and desensitizing and trying to break up scar tissue that typically formed on the incision.  She still has a little numbness in the tips of her fingers but she feels much better than prior to surgery.  She may return as needed she understands it could be up to 18 months to determine full outcome of the surgery with regards to sensation.  Follow-Up Instructions: Return if symptoms worsen or fail to improve.   Orders:  No orders of the defined types were placed in this encounter.  No orders of the defined types were placed in this encounter.     Procedures: No procedures performed   Clinical Data: No additional findings.   Subjective: Chief Complaint  Patient presents with   Left Wrist - Follow-up    Carpal tunnel release 11/06/2021  Patient presents today for follow up on her left wrist. She had carpal tunnel release on 11/06/2021. She is now a month out from surgery. She states that she is doing well, but still has some soreness.    Review of Systems  All other systems reviewed and are negative.    Objective: Vital Signs: There were no vitals taken for this visit.  Physical Exam Constitutional:      Appearance: Normal appearance.  Neurological:     Mental Status: She is alert.     Ortho Exam Patient is comfortably sitting in a chair.  Examination of her left hand she has a well-healed surgical incision fairly soft.  She  does have some dry skin but no redness no erythema no swelling she has a good grip.  Just slightly altered sensation in the tips of her index and thumb but has brisk capillary refill radial pulse is easily palpable Specialty Comments:  No specialty comments available.  Imaging: No results found.   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) 12/20/2018   Sleep apnea 01/31/2018   Atrophic vaginitis 01/31/2018   Hyponatremia 01/31/2018   Increased body mass index 01/31/2018   Osteoarthritis 01/31/2018   Postmenopausal bleeding 01/31/2018   Systemic lupus erythematosus ([place]) 01/31/2018   Uterine leiomyoma 01/31/2018   Trigger middle finger of left hand 01/11/2017   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   High risk medication use 01/06/2016   S/P TKR (total knee replacement), bilateral 01/06/2016   Osteoarthritis of hands, bilateral 01/06/2016   Vitamin D deficiency 01/06/2016   Deep venous thrombosis of lower  extremity ([place]) 06/04/2015   Osteopenia 03/14/2012   Diabetes mellitus type 2 in obese ([place]) 03/14/2012   ASCVD (arteriosclerotic cardiovascular disease) 03/14/2012   Hypertension 03/14/2012   Past Medical History:  Diagnosis Date   Carpal tunnel syndrome    Clotting disorder ([place])    Cough 02/08/2017   Diabetic retinopathy ([place])    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 since 1996   Insulin dependent diabetes mellitus     Mitral valve prolapse    Neuropathy    Osteoarthritis    bilateral knee   Seasonal allergies    Sleep apnea    no CPAP use in several months, per pt.   Systemic lupus erythematosus (HCC)    Trigger finger, left middle finger 01/2017    Family History  Problem Relation Age of Onset   Cancer Mother        colon   Cancer Cousin        lung   Diabetes Sister    Diabetes Sister    Diabetes Sister     Past Surgical History:  Procedure Laterality Date   BACK SURGERY     lower - removed  cysts   CATARACT EXTRACTION W/ INTRAOCULAR LENS  IMPLANT, BILATERAL Bilateral    GLAUCOMA SURGERY Bilateral    laser   HEMILAMINOTOMY LUMBAR SPINE Right 01/30/2009   L5   JOINT REPLACEMENT     TOTAL KNEE ARTHROPLASTY Right 09/22/2007   TOTAL KNEE ARTHROPLASTY Left 02/01/2007   TOTAL KNEE REVISION Right 01/31/2019   Procedure: RIGHT TOTAL KNEE REVISION;  Surgeon: [name], MD;  Location: [facility];  Service: Orthopedics;  Laterality: Right;   TRIGGER FINGER RELEASE Right 12/14/2013   Procedure: RELEASE TRIGGER FINGER/A-1 PULLEY RIGHT RING FINGER;  Surgeon: Daryll Brod, MD;  Location: [facility];  Service: Orthopedics;  Laterality: Right;   TRIGGER FINGER RELEASE Left 02/11/2017   Procedure: RELEASE TRIGGER FINGER/A-1 PULLEY;  Surgeon: Leanora Cover, MD;  Location: [facility];  Service: Orthopedics;  Laterality: Left;   Social History   Occupational History   Not on file  Tobacco Use   Smoking status: Never   Smokeless tobacco: Never  Vaping Use   Vaping Use: Never used  Substance and Sexual Activity   Alcohol use: No   Drug use: No   Sexual activity: Not on file

## 2021-12-24 ENCOUNTER — Encounter (INDEPENDENT_AMBULATORY_CARE_PROVIDER_SITE_OTHER): Payer: Medicare Other | Admitting: Ophthalmology

## 2021-12-26 ENCOUNTER — Encounter (INDEPENDENT_AMBULATORY_CARE_PROVIDER_SITE_OTHER): Payer: Medicare Other | Admitting: Ophthalmology

## 2021-12-26 DIAGNOSIS — H43813 Vitreous degeneration, bilateral: Secondary | ICD-10-CM | POA: Diagnosis not present

## 2021-12-26 DIAGNOSIS — H33302 Unspecified retinal break, left eye: Secondary | ICD-10-CM | POA: Diagnosis not present

## 2021-12-26 DIAGNOSIS — I10 Essential (primary) hypertension: Secondary | ICD-10-CM | POA: Diagnosis not present

## 2021-12-26 DIAGNOSIS — E113311 Type 2 diabetes mellitus with moderate nonproliferative diabetic retinopathy with macular edema, right eye: Secondary | ICD-10-CM | POA: Diagnosis not present

## 2021-12-26 DIAGNOSIS — E113392 Type 2 diabetes mellitus with moderate nonproliferative diabetic retinopathy without macular edema, left eye: Secondary | ICD-10-CM

## 2021-12-26 DIAGNOSIS — H35033 Hypertensive retinopathy, bilateral: Secondary | ICD-10-CM

## 2022-01-20 DIAGNOSIS — Z23 Encounter for immunization: Secondary | ICD-10-CM | POA: Diagnosis not present

## 2022-01-20 DIAGNOSIS — E1169 Type 2 diabetes mellitus with other specified complication: Secondary | ICD-10-CM | POA: Diagnosis not present

## 2022-01-20 DIAGNOSIS — E039 Hypothyroidism, unspecified: Secondary | ICD-10-CM | POA: Diagnosis not present

## 2022-01-20 DIAGNOSIS — I82509 Chronic embolism and thrombosis of unspecified deep veins of unspecified lower extremity: Secondary | ICD-10-CM | POA: Diagnosis not present

## 2022-01-20 DIAGNOSIS — I10 Essential (primary) hypertension: Secondary | ICD-10-CM | POA: Diagnosis not present

## 2022-01-23 ENCOUNTER — Encounter (INDEPENDENT_AMBULATORY_CARE_PROVIDER_SITE_OTHER): Payer: Medicare Other | Admitting: Ophthalmology

## 2022-02-02 DIAGNOSIS — Z1231 Encounter for screening mammogram for malignant neoplasm of breast: Secondary | ICD-10-CM | POA: Diagnosis not present

## 2022-02-09 ENCOUNTER — Encounter: Payer: Self-pay | Admitting: Podiatry

## 2022-02-09 ENCOUNTER — Ambulatory Visit (INDEPENDENT_AMBULATORY_CARE_PROVIDER_SITE_OTHER): Payer: Medicare Other | Admitting: Podiatry

## 2022-02-09 DIAGNOSIS — M79675 Pain in left toe(s): Secondary | ICD-10-CM

## 2022-02-09 DIAGNOSIS — M79674 Pain in right toe(s): Secondary | ICD-10-CM

## 2022-02-09 DIAGNOSIS — B351 Tinea unguium: Secondary | ICD-10-CM | POA: Diagnosis not present

## 2022-02-09 NOTE — Progress Notes (Signed)
   Chief Complaint  Patient presents with   foot care    Patient is here for diabetic foot care.    SUBJECTIVE Patient presents to office today complaining of elongated, thickened nails that cause pain while ambulating in shoes.  Patient is unable to trim their own nails.   Past Medical History:  Diagnosis Date   Carpal tunnel syndrome    Clotting disorder ([place])    Cough 02/08/2017   Diabetic retinopathy ([place])    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 since 1996   Insulin dependent diabetes mellitus    Mitral valve prolapse    Neuropathy    Osteoarthritis    bilateral knee   Seasonal allergies    Sleep apnea    no CPAP use in several months, per pt.   Systemic lupus erythematosus (HCC)    Trigger finger, left middle finger 01/2017    OBJECTIVE General Patient is awake, alert, and oriented x 3 and in no acute distress. Derm Skin is dry and supple bilateral. Negative open lesions or macerations. Remaining integument unremarkable. Nails are tender, long, thickened and dystrophic with subungual debris, consistent with onychomycosis, 1-5 bilateral. No signs of infection noted. Vasc  DP and PT pedal pulses palpable bilaterally. Temperature gradient within normal limits.  Neuro Epicritic and protective threshold sensation grossly intact bilaterally.  Musculoskeletal Exam pes planus deformity noted to the bilateral feet left greater than the right.  There is collapse of the medial longitudinal arch and tenderness along the posterior tibial tendon of the left specifically  ASSESSMENT 1.  Pain due to onychomycosis of toenails both 2.  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  3. PTTD LLE 4.  Diabetes mellitus with peripheral  polyneuropathy  PLAN OF CARE 1. Patient evaluated today.  2. Instructed to maintain good pedal hygiene and foot care.  3. Mechanical debridement of nails 1-5 bilaterally performed using a nail nipper. Filed with dremel without incident.  4.  Continue wearing diabetic shoes and insoles 5.  Return to clinic in 3 months for routine foot care   Edrick Kins, DPM [facility]  Dr. Edrick Kins, DPM    [address], [place] [address]                Office [phone]  Fax [phone]

## 2022-02-14 ENCOUNTER — Emergency Department (HOSPITAL_COMMUNITY): Payer: Medicare Other

## 2022-02-14 ENCOUNTER — Emergency Department (HOSPITAL_COMMUNITY)
Admission: EM | Admit: 2022-02-14 | Discharge: 2022-02-14 | Disposition: A | Discharge status: Home / Self Care | Payer: Medicare Other | Attending: Emergency Medicine | Admitting: Emergency Medicine

## 2022-02-14 ENCOUNTER — Encounter (HOSPITAL_COMMUNITY): Payer: Self-pay

## 2022-02-14 DIAGNOSIS — I700 Atherosclerosis of aorta: Secondary | ICD-10-CM | POA: Diagnosis not present

## 2022-02-14 DIAGNOSIS — M542 Cervicalgia: Secondary | ICD-10-CM | POA: Insufficient documentation

## 2022-02-14 DIAGNOSIS — Z7984 Long term (current) use of oral hypoglycemic drugs: Secondary | ICD-10-CM | POA: Insufficient documentation

## 2022-02-14 DIAGNOSIS — S3991XA Unspecified injury of abdomen, initial encounter: Secondary | ICD-10-CM | POA: Diagnosis not present

## 2022-02-14 DIAGNOSIS — Z794 Long term (current) use of insulin: Secondary | ICD-10-CM | POA: Insufficient documentation

## 2022-02-14 DIAGNOSIS — Z79899 Other long term (current) drug therapy: Secondary | ICD-10-CM | POA: Diagnosis not present

## 2022-02-14 DIAGNOSIS — I10 Essential (primary) hypertension: Secondary | ICD-10-CM | POA: Insufficient documentation

## 2022-02-14 DIAGNOSIS — Z7901 Long term (current) use of anticoagulants: Secondary | ICD-10-CM | POA: Insufficient documentation

## 2022-02-14 DIAGNOSIS — M47812 Spondylosis without myelopathy or radiculopathy, cervical region: Secondary | ICD-10-CM | POA: Diagnosis not present

## 2022-02-14 DIAGNOSIS — G319 Degenerative disease of nervous system, unspecified: Secondary | ICD-10-CM | POA: Diagnosis not present

## 2022-02-14 DIAGNOSIS — S299XXA Unspecified injury of thorax, initial encounter: Secondary | ICD-10-CM | POA: Diagnosis not present

## 2022-02-14 DIAGNOSIS — Z041 Encounter for examination and observation following transport accident: Secondary | ICD-10-CM | POA: Diagnosis not present

## 2022-02-14 DIAGNOSIS — S0990XA Unspecified injury of head, initial encounter: Secondary | ICD-10-CM | POA: Diagnosis not present

## 2022-02-14 DIAGNOSIS — M25521 Pain in right elbow: Secondary | ICD-10-CM | POA: Insufficient documentation

## 2022-02-14 DIAGNOSIS — E119 Type 2 diabetes mellitus without complications: Secondary | ICD-10-CM | POA: Insufficient documentation

## 2022-02-14 DIAGNOSIS — S161XXA Strain of muscle, fascia and tendon at neck level, initial encounter: Secondary | ICD-10-CM | POA: Diagnosis not present

## 2022-02-14 DIAGNOSIS — K5730 Diverticulosis of large intestine without perforation or abscess without bleeding: Secondary | ICD-10-CM | POA: Diagnosis not present

## 2022-02-14 DIAGNOSIS — K8020 Calculus of gallbladder without cholecystitis without obstruction: Secondary | ICD-10-CM | POA: Diagnosis not present

## 2022-02-14 DIAGNOSIS — R1010 Upper abdominal pain, unspecified: Secondary | ICD-10-CM | POA: Diagnosis not present

## 2022-02-14 DIAGNOSIS — M778 Other enthesopathies, not elsewhere classified: Secondary | ICD-10-CM | POA: Diagnosis not present

## 2022-02-14 DIAGNOSIS — S3993XA Unspecified injury of pelvis, initial encounter: Secondary | ICD-10-CM | POA: Diagnosis not present

## 2022-02-14 DIAGNOSIS — R9082 White matter disease, unspecified: Secondary | ICD-10-CM | POA: Diagnosis not present

## 2022-02-14 LAB — COMPREHENSIVE METABOLIC PANEL
ALT: 15 U/L (ref 0–44)
AST: 18 U/L (ref 15–41)
Albumin: 3.2 g/dL — ABNORMAL LOW (ref 3.5–5.0)
Alkaline Phosphatase: 86 U/L (ref 38–126)
Anion gap: 8 (ref 5–15)
BUN: 17 mg/dL (ref 8–23)
CO2: 25 mmol/L (ref 22–32)
Calcium: 8.8 mg/dL — ABNORMAL LOW (ref 8.9–10.3)
Chloride: 106 mmol/L (ref 98–111)
Creatinine, Ser: 1.01 mg/dL — ABNORMAL HIGH (ref 0.44–1.00)
GFR, Estimated: 57 mL/min — ABNORMAL LOW (ref >60)
Glucose, Bld: 187 mg/dL — ABNORMAL HIGH (ref 70–99)
Potassium: 4.4 mmol/L (ref 3.5–5.1)
Sodium: 139 mmol/L (ref 135–145)
Total Bilirubin: 0.2 mg/dL — ABNORMAL LOW (ref 0.3–1.2)
Total Protein: 6.9 g/dL (ref 6.5–8.1)

## 2022-02-14 LAB — CBC WITH DIFFERENTIAL/PLATELET
Abs Immature Granulocytes: 0.03 10*3/uL (ref 0.00–0.07)
Basophils Absolute: 0 10*3/uL (ref 0.0–0.1)
Basophils Relative: 0 %
Eosinophils Absolute: 0 10*3/uL (ref 0.0–0.5)
Eosinophils Relative: 0 %
HCT: 41.8 % (ref 36.0–46.0)
Hemoglobin: 13.4 g/dL (ref 12.0–15.0)
Immature Granulocytes: 0 %
Lymphocytes Relative: 41 %
Lymphs Abs: 4.9 10*3/uL — ABNORMAL HIGH (ref 0.7–4.0)
MCH: 27.8 pg (ref 26.0–34.0)
MCHC: 32.1 g/dL (ref 30.0–36.0)
MCV: 86.7 fL (ref 80.0–100.0)
Monocytes Absolute: 1.5 10*3/uL — ABNORMAL HIGH (ref 0.1–1.0)
Monocytes Relative: 12 %
Neutro Abs: 5.4 10*3/uL (ref 1.7–7.7)
Neutrophils Relative %: 47 %
Platelets: 307 10*3/uL (ref 150–400)
RBC: 4.82 MIL/uL (ref 3.87–5.11)
RDW: 16.7 % — ABNORMAL HIGH (ref 11.5–15.5)
WBC: 11.8 10*3/uL — ABNORMAL HIGH (ref 4.0–10.5)
nRBC: 0 % (ref 0.0–0.2)

## 2022-02-14 LAB — PROTIME-INR
INR: 1.3 — ABNORMAL HIGH (ref 0.8–1.2)
Prothrombin Time: 16.1 seconds — ABNORMAL HIGH (ref 11.4–15.2)

## 2022-02-14 MED ORDER — IOHEXOL 350 MG/ML SOLN
50.0000 mL | Freq: Once | INTRAVENOUS | Status: AC | PRN
  Administered 2022-02-14: 50 mL via INTRAVENOUS

## 2022-02-14 MED ORDER — HYDROCODONE-ACETAMINOPHEN 5-325 MG PO TABS: 1.0000 | ORAL_TABLET | Freq: Four times a day (QID) | ORAL | 0 refills | Status: DC | PRN
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
## 2022-02-14 NOTE — ED Notes (Signed)
The pt is waiting on the report of her c-t scan  she is uncomfortable in the c-collar
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
## 2022-02-14 NOTE — ED Provider Notes (Signed)
I have independently visualized and interpreted pt's images today.  CT of the head without acute intracranial hemorrhage, cervical spine negative for fracture, lab work with subtherapeutic INR of 1.3 stable hemoglobin and renal function.  Chest abdomen pelvis CT is negative for acute injury and elbow is negative.  Findings were discussed with the patient.  She was discharged home in good condition.    Blanchie Dessert, MD 02/14/22 1740

## 2022-02-20 ENCOUNTER — Encounter (INDEPENDENT_AMBULATORY_CARE_PROVIDER_SITE_OTHER): Payer: Medicare Other | Admitting: Ophthalmology

## 2022-02-20 DIAGNOSIS — E113311 Type 2 diabetes mellitus with moderate nonproliferative diabetic retinopathy with macular edema, right eye: Secondary | ICD-10-CM | POA: Diagnosis not present

## 2022-02-20 DIAGNOSIS — H33302 Unspecified retinal break, left eye: Secondary | ICD-10-CM

## 2022-02-20 DIAGNOSIS — H35033 Hypertensive retinopathy, bilateral: Secondary | ICD-10-CM

## 2022-02-20 DIAGNOSIS — I10 Essential (primary) hypertension: Secondary | ICD-10-CM

## 2022-02-20 DIAGNOSIS — H35372 Puckering of macula, left eye: Secondary | ICD-10-CM | POA: Diagnosis not present

## 2022-02-20 DIAGNOSIS — H43813 Vitreous degeneration, bilateral: Secondary | ICD-10-CM

## 2022-02-20 DIAGNOSIS — E113292 Type 2 diabetes mellitus with mild nonproliferative diabetic retinopathy without macular edema, left eye: Secondary | ICD-10-CM

## 2022-02-27 DIAGNOSIS — S1093XA Contusion of unspecified part of neck, initial encounter: Secondary | ICD-10-CM | POA: Diagnosis not present

## 2022-02-27 DIAGNOSIS — M542 Cervicalgia: Secondary | ICD-10-CM | POA: Diagnosis not present

## 2022-03-03 ENCOUNTER — Ambulatory Visit (INDEPENDENT_AMBULATORY_CARE_PROVIDER_SITE_OTHER): Payer: Medicare Other | Admitting: Orthopaedic Surgery

## 2022-03-03 ENCOUNTER — Ambulatory Visit (INDEPENDENT_AMBULATORY_CARE_PROVIDER_SITE_OTHER): Payer: Medicare Other

## 2022-03-03 ENCOUNTER — Encounter: Payer: Self-pay | Admitting: Orthopaedic Surgery

## 2022-03-03 DIAGNOSIS — M542 Cervicalgia: Secondary | ICD-10-CM | POA: Insufficient documentation

## 2022-03-03 DIAGNOSIS — M545 Low back pain, unspecified: Secondary | ICD-10-CM
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
## 2022-03-04 ENCOUNTER — Other Ambulatory Visit: Payer: Self-pay | Admitting: Physician Assistant

## 2022-03-04 ENCOUNTER — Telehealth: Payer: Self-pay | Admitting: Orthopaedic Surgery

## 2022-03-04 MED ORDER — HYDROCODONE-ACETAMINOPHEN 5-325 MG PO TABS: 1.0000 | ORAL_TABLET | Freq: Two times a day (BID) | ORAL | 0 refills | Status: DC | PRN

## 2022-03-04 NOTE — Telephone Encounter (Signed)
Pt seen dr Durward Fortes yesterday and he was to send hydrocodone in for pt. Please send pain medication to [facility] on [address]. Pt phone number is [phone]

## 2022-03-23 ENCOUNTER — Encounter (INDEPENDENT_AMBULATORY_CARE_PROVIDER_SITE_OTHER): Payer: Medicare Other | Admitting: Ophthalmology

## 2022-03-23 DIAGNOSIS — H33302 Unspecified retinal break, left eye: Secondary | ICD-10-CM

## 2022-03-23 DIAGNOSIS — E113392 Type 2 diabetes mellitus with moderate nonproliferative diabetic retinopathy without macular edema, left eye: Secondary | ICD-10-CM | POA: Diagnosis not present

## 2022-03-23 DIAGNOSIS — H35033 Hypertensive retinopathy, bilateral: Secondary | ICD-10-CM | POA: Diagnosis not present

## 2022-03-23 DIAGNOSIS — H43813 Vitreous degeneration, bilateral: Secondary | ICD-10-CM | POA: Diagnosis not present

## 2022-03-23 DIAGNOSIS — I10 Essential (primary) hypertension: Secondary | ICD-10-CM

## 2022-03-23 DIAGNOSIS — E113311 Type 2 diabetes mellitus with moderate nonproliferative diabetic retinopathy with macular edema, right eye: Secondary | ICD-10-CM

## 2022-03-23 DIAGNOSIS — H35373 Puckering of macula, bilateral: Secondary | ICD-10-CM

## 2022-03-23 NOTE — Therapy (Unsigned)
OUTPATIENT PHYSICAL THERAPY THORACOLUMBAR EVALUATION   Patient Name: Kaitlyn Jackson MRN: [id number] DOB:Mar 25, 1945, 77 y.o., female Today's Date: 03/23/2022  END OF SESSION:   Past Medical History:  Diagnosis Date   Carpal tunnel syndrome    Clotting disorder ([facility])    Cough 02/08/2017   Diabetic retinopathy ([place])    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 since 1996   Insulin dependent diabetes mellitus    Mitral valve prolapse    Neuropathy    Osteoarthritis    bilateral knee   Seasonal allergies    Sleep apnea    no CPAP use in several months, per pt.   Systemic lupus erythematosus (HCC)    Trigger finger, left middle finger 01/2017   Past Surgical History:  Procedure Laterality Date   BACK SURGERY     lower - removed  cysts   CATARACT EXTRACTION W/ INTRAOCULAR LENS  IMPLANT, BILATERAL Bilateral    GLAUCOMA SURGERY Bilateral    laser   HEMILAMINOTOMY LUMBAR SPINE Right 01/30/2009   L5   JOINT REPLACEMENT     TOTAL KNEE ARTHROPLASTY Right 09/22/2007   TOTAL KNEE ARTHROPLASTY Left 02/01/2007   TOTAL KNEE REVISION Right 01/31/2019   Procedure: RIGHT TOTAL KNEE REVISION;  Surgeon: [name], MD;  Location[facility];  Service: Orthopedics;  Laterality: Right;   TRIGGER FINGER RELEASE Right 12/14/2013   Procedure: RELEASE TRIGGER FINGER/A-1 PULLEY RIGHT RING FINGER;  Surgeon: Daryll Brod, MD;  Location: [facility];  Service: Orthopedics;  Laterality: Right;   TRIGGER FINGER RELEASE Left 02/11/2017   Procedure: RELEASE TRIGGER FINGER/A-1 PULLEY;  Surgeon: Leanora Cover, MD;  Location: [facility];  Service: Orthopedics;  Laterality: Left;   Patient Active Problem List   Diagnosis Date Noted   Neck  pain 03/03/2022   Low back pain 03/03/2022   Trigger finger of right hand 11/13/2021   Carpal tunnel syndrome, left upper limb 09/04/2021   Carpal tunnel syndrome, right upper limb 08/14/2021   Pain in right knee 02/14/2019   S/P revision of total knee, right 01/31/2019   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) 12/20/2018   Sleep apnea 01/31/2018   Atrophic vaginitis 01/31/2018   Hyponatremia 01/31/2018   Increased body mass index 01/31/2018   Osteoarthritis 01/31/2018   Postmenopausal bleeding 01/31/2018   Systemic lupus erythematosus ([place]) 01/31/2018   Uterine leiomyoma 01/31/2018   Trigger middle finger of left hand 01/11/2017   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  ONSET DATE: ***  SUBJECTIVE:  SUBJECTIVE STATEMENT: ***  PERTINENT HISTORY:  ***  PAIN:  Are you having pain? {OPRCPAIN:27236}  PRECAUTIONS: {Therapy precautions:24002}  WEIGHT BEARING RESTRICTIONS: {Yes ***/No:24003}  FALLS:  Has patient fallen in last 6 months? {fallsyesno:27318}  LIVING ENVIRONMENT: Lives with: {OPRC lives with:25569::"lives with their family"} Lives in: {Lives in:25570} Stairs: {opstairs:27293} Has following equipment  at home: {Assistive devices:23999}  OCCUPATION: ***  PLOF: {PLOF:24004}  PATIENT GOALS: ***  NEXT MD VISIT:   OBJECTIVE:   DIAGNOSTIC FINDINGS:  ***  PATIENT SURVEYS:  {rehab surveys:24030}  SCREENING FOR RED FLAGS: Bowel or bladder incontinence: {Yes/No:304960894} Spinal tumors: {Yes/No:304960894} Cauda equina syndrome: {Yes/No:304960894} Compression fracture: {Yes/No:304960894} Abdominal aneurysm: {Yes/No:304960894}  COGNITION: Overall cognitive status: {cognition:24006}     SENSATION: {sensation:27233}  MUSCLE LENGTH: Hamstrings: Right *** deg; Left *** deg Thomas test: Right *** deg; Left *** deg  POSTURE: {posture:25561}  PALPATION: ***  LUMBAR ROM:   AROM eval  Flexion   Extension   Right lateral flexion   Left lateral flexion   Right rotation   Left rotation    (Blank rows = not tested)  LOWER EXTREMITY ROM:     {AROM/PROM:27142}  Right eval Left eval  Hip flexion    Hip extension    Hip abduction    Hip adduction    Hip internal rotation    Hip external rotation    Knee flexion    Knee extension    Ankle dorsiflexion    Ankle plantarflexion    Ankle inversion    Ankle eversion     (Blank rows = not tested)  LOWER EXTREMITY MMT:    MMT Right eval Left eval  Hip flexion    Hip extension    Hip abduction    Hip adduction    Hip internal rotation    Hip external rotation    Knee flexion    Knee extension    Ankle dorsiflexion    Ankle plantarflexion    Ankle inversion    Ankle eversion     (Blank rows = not tested)  LUMBAR SPECIAL TESTS:  {lumbar special test:25242}  FUNCTIONAL TESTS:  {Functional tests:24029}  GAIT: Distance walked: *** Assistive device utilized: {Assistive devices:23999} Level of assistance: {Levels of assistance:24026} Comments: ***  TODAY'S TREATMENT:                                                                                                                              DATE: ***     PATIENT EDUCATION:  Education details: *** Person educated: {Person educated:25204} Education method: {Education Method:25205} Education comprehension: {Education Comprehension:25206}  HOME EXERCISE PROGRAM: ***  ASSESSMENT:  CLINICAL IMPRESSION: Patient is a *** y.o. *** who was seen today for physical therapy evaluation and treatment for ***.   OBJECTIVE IMPAIRMENTS: {opptimpairments:25111}.   ACTIVITY LIMITATIONS: {activitylimitations:27494}  PARTICIPATION LIMITATIONS: {participationrestrictions:25113}  PERSONAL FACTORS: {Personal factors:25162} are also affecting patient's functional outcome.   REHAB POTENTIAL: {rehabpotential:25112}  CLINICAL DECISION MAKING: {clinical decision  making:25114}  EVALUATION COMPLEXITY: {Evaluation complexity:25115}   GOALS: Goals reviewed with patient? {yes/no:20286}  SHORT TERM GOALS: Target date: ***  *** Baseline: Goal status: {GOALSTATUS:25110}  2.  *** Baseline:  Goal status: {GOALSTATUS:25110}  3.  *** Baseline:  Goal status: {GOALSTATUS:25110}  4.  *** Baseline:  Goal status: {GOALSTATUS:25110}  5.  *** Baseline:  Goal status: {GOALSTATUS:25110}  6.  *** Baseline:  Goal status: {GOALSTATUS:25110}  LONG TERM GOALS: Target date: ***  *** Baseline:  Goal status: {GOALSTATUS:25110}  2.  *** Baseline:  Goal status: {GOALSTATUS:25110}  3.  *** Baseline:  Goal status: {GOALSTATUS:25110}  4.  *** Baseline:  Goal status: {GOALSTATUS:25110}  5.  *** Baseline:  Goal status: {GOALSTATUS:25110}  6.  *** Baseline:  Goal status: {GOALSTATUS:25110}  PLAN:  PT FREQUENCY: {rehab frequency:25116}  PT DURATION: {rehab duration:25117}  PLANNED INTERVENTIONS: {rehab planned interventions:25118::"Therapeutic exercises","Therapeutic activity","Neuromuscular re-education","Balance training","Gait training","Patient/Family education","Self Care","Joint mobilization"}.  PLAN FOR NEXT SESSION:  ***   Jacqueline Delapena, PT 03/23/2022, 11:40 AM

## 2022-03-24 ENCOUNTER — Ambulatory Visit: Payer: Medicare Other | Attending: Orthopaedic Surgery

## 2022-03-24 ENCOUNTER — Other Ambulatory Visit: Payer: Self-pay

## 2022-03-24 DIAGNOSIS — S161XXA Strain of muscle, fascia and tendon at neck level, initial encounter: Secondary | ICD-10-CM

## 2022-03-24 DIAGNOSIS — M545 Low back pain, unspecified: Secondary | ICD-10-CM | POA: Insufficient documentation

## 2022-03-24 DIAGNOSIS — S39012A Strain of muscle, fascia and tendon of lower back, initial encounter: Secondary | ICD-10-CM
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
## 2022-03-24 NOTE — Patient Instructions (Signed)
Aquatic Therapy at [facility]-  What to Expect!  Where:   [facility] [address] [place], [place] [address] Rehab phone [phone]  NOTE:  You will receive an automated phone message reminding you of your appt and it will say the appointment is at the [address] [facility] [address] [facility] clinic.          How to Prepare: Please make sure you drink 8 ounces of water about one hour prior to your pool session A caregiver may attend if needed with the patient to help assist as needed. A caregiver can sit in the pool room on chair. Please arrive IN YOUR SUIT and 15 minutes prior to your appointment - this helps to avoid delays in starting your session. Please make sure to attend to any toileting needs prior to entering the pool Locker rooms for changing are provided.   There is direct access to the pool deck form the locker room.  You can lock your belongings in a locker with lock provided. Once on the pool deck your therapist will ask if you have signed the Patient  Consent and Assignment of Benefits form before beginning treatment Your therapist may take your blood pressure prior to, during and after your session if indicated We usually try and create a home exercise program based on activities we do in the pool.  Please be thinking about who might be able to assist you in the pool should you need to participate in an aquatic home exercise program at the time of discharge if you need assistance.  Some patients do not want to or do not have the ability to participate in an aquatic home program - this is not a barrier in any way to you participating in aquatic therapy as part of your current therapy plan! After Discharge from PT, you can continue using home program at  the [facility]/, there is a drop-in fee for $5 ($45 a month)or for 60 years  or older $4.00 ($40 a month for seniors ) or any local [employer] pool.  Memberships for purchase are  available for gym/pool at [facility]  IT IS VERY IMPORTANT THAT YOUR LAST VISIT BE IN THE CLINIC AT [address] AFTER YOUR LAST AQUATIC VISIT.  PLEASE MAKE SURE THAT YOU HAVE A [address]  APPOINTMENT SCHEDULED.   About the pool: Pool is located approximately 500 FT from the entrance of the building.  Please bring a support person if you need assistance traveling this      distance.   Your therapist will assist you in entering the water; there are two ways to           enter: stairs with railings, and a mechanical lift. Your therapist will determine the most appropriate way for you.  Water temperature is usually between 88-90 degrees  There may be up to 2 other swimmers in the pool at the same time  The pool deck is tile, please wear shoes with good traction if you prefer not to be barefoot.    Contact Info:  For appointment scheduling and cancellations:         Please call the [facility]  PH:[phone]              Aquatic Therapy  [facility]       All sessions are 45 minutes                                                    

## 2022-03-27 DIAGNOSIS — E1169 Type 2 diabetes mellitus with other specified complication: Secondary | ICD-10-CM | POA: Diagnosis not present

## 2022-03-27 DIAGNOSIS — M25512 Pain in left shoulder: Secondary | ICD-10-CM | POA: Diagnosis not present

## 2022-03-27 DIAGNOSIS — M545 Low back pain, unspecified: Secondary | ICD-10-CM | POA: Diagnosis not present

## 2022-04-03 ENCOUNTER — Encounter: Payer: Self-pay | Admitting: Physician Assistant

## 2022-04-03 ENCOUNTER — Ambulatory Visit (INDEPENDENT_AMBULATORY_CARE_PROVIDER_SITE_OTHER): Payer: Medicare Other | Admitting: Physician Assistant

## 2022-04-03 ENCOUNTER — Ambulatory Visit: Payer: Medicare Other | Admitting: Physician Assistant

## 2022-04-03 DIAGNOSIS — M545 Low back pain, unspecified: Secondary | ICD-10-CM | POA: Diagnosis not present
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
## 2022-04-08 ENCOUNTER — Ambulatory Visit: Payer: Medicare Other | Admitting: Physical Therapy

## 2022-04-08 DIAGNOSIS — S161XXA Strain of muscle, fascia and tendon at neck level, initial encounter: Secondary | ICD-10-CM | POA: Diagnosis not present

## 2022-04-08 DIAGNOSIS — M545 Low back pain, unspecified: Secondary | ICD-10-CM | POA: Diagnosis not present

## 2022-04-08 DIAGNOSIS — S39012A Strain of muscle, fascia and tendon of lower back, initial encounter: Secondary | ICD-10-CM | POA: Diagnosis not present
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
## 2022-04-13 DIAGNOSIS — M130 Polyarthritis, unspecified: Secondary | ICD-10-CM | POA: Diagnosis not present

## 2022-04-13 DIAGNOSIS — D6832 Hemorrhagic disorder due to extrinsic circulating anticoagulants: Secondary | ICD-10-CM | POA: Diagnosis not present

## 2022-04-13 DIAGNOSIS — E1169 Type 2 diabetes mellitus with other specified complication: Secondary | ICD-10-CM | POA: Diagnosis not present

## 2022-04-14 DIAGNOSIS — E039 Hypothyroidism, unspecified: Secondary | ICD-10-CM | POA: Diagnosis not present

## 2022-04-14 DIAGNOSIS — E1169 Type 2 diabetes mellitus with other specified complication: Secondary | ICD-10-CM | POA: Diagnosis not present

## 2022-04-14 DIAGNOSIS — I10 Essential (primary) hypertension: Secondary | ICD-10-CM | POA: Diagnosis not present
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
## 2022-04-15 ENCOUNTER — Other Ambulatory Visit: Payer: Self-pay

## 2022-04-15 ENCOUNTER — Ambulatory Visit: Payer: Medicare Other | Admitting: Physical Therapy

## 2022-04-15 DIAGNOSIS — M545 Low back pain, unspecified: Secondary | ICD-10-CM | POA: Diagnosis not present

## 2022-04-15 DIAGNOSIS — S39012A Strain of muscle, fascia and tendon of lower back, initial encounter: Secondary | ICD-10-CM | POA: Diagnosis not present

## 2022-04-15 DIAGNOSIS — S161XXA Strain of muscle, fascia and tendon at neck level, initial encounter: Secondary | ICD-10-CM
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
## 2022-04-20 ENCOUNTER — Encounter (INDEPENDENT_AMBULATORY_CARE_PROVIDER_SITE_OTHER): Payer: Medicare Other | Admitting: Ophthalmology

## 2022-04-20 DIAGNOSIS — I10 Essential (primary) hypertension: Secondary | ICD-10-CM

## 2022-04-20 DIAGNOSIS — E113311 Type 2 diabetes mellitus with moderate nonproliferative diabetic retinopathy with macular edema, right eye: Secondary | ICD-10-CM | POA: Diagnosis not present

## 2022-04-20 DIAGNOSIS — H35372 Puckering of macula, left eye: Secondary | ICD-10-CM | POA: Diagnosis not present

## 2022-04-20 DIAGNOSIS — H33302 Unspecified retinal break, left eye: Secondary | ICD-10-CM | POA: Diagnosis not present

## 2022-04-20 DIAGNOSIS — H35033 Hypertensive retinopathy, bilateral: Secondary | ICD-10-CM | POA: Diagnosis not present

## 2022-04-20 DIAGNOSIS — E113392 Type 2 diabetes mellitus with moderate nonproliferative diabetic retinopathy without macular edema, left eye: Secondary | ICD-10-CM

## 2022-04-20 DIAGNOSIS — H43813 Vitreous degeneration, bilateral: Secondary | ICD-10-CM

## 2022-04-21 ENCOUNTER — Ambulatory Visit: Payer: Medicare Other | Attending: Orthopaedic Surgery

## 2022-04-21 DIAGNOSIS — S39012A Strain of muscle, fascia and tendon of lower back, initial encounter: Secondary | ICD-10-CM | POA: Diagnosis not present

## 2022-04-21 DIAGNOSIS — S161XXA Strain of muscle, fascia and tendon at neck level, initial encounter: Secondary | ICD-10-CM | POA: Insufficient documentation
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
## 2022-04-28 ENCOUNTER — Ambulatory Visit: Payer: Medicare Other

## 2022-04-28 DIAGNOSIS — S161XXA Strain of muscle, fascia and tendon at neck level, initial encounter: Secondary | ICD-10-CM | POA: Diagnosis not present

## 2022-04-28 DIAGNOSIS — S39012A Strain of muscle, fascia and tendon of lower back, initial encounter: Secondary | ICD-10-CM | POA: Diagnosis not present

## 2022-04-30 ENCOUNTER — Ambulatory Visit
Admission: RE | Admit: 2022-04-30 | Discharge: 2022-04-30 | Disposition: A | Discharge status: Home / Self Care | Payer: Medicare Other | Source: Ambulatory Visit | Attending: Physician Assistant | Admitting: Physician Assistant

## 2022-04-30 DIAGNOSIS — M545 Low back pain, unspecified: Secondary | ICD-10-CM

## 2022-04-30 DIAGNOSIS — M48061 Spinal stenosis, lumbar region without neurogenic claudication: Secondary | ICD-10-CM | POA: Diagnosis not present

## 2022-05-04 ENCOUNTER — Ambulatory Visit (INDEPENDENT_AMBULATORY_CARE_PROVIDER_SITE_OTHER): Payer: Medicare Other | Admitting: Physical Medicine and Rehabilitation

## 2022-05-04 ENCOUNTER — Encounter: Payer: Self-pay | Admitting: Physical Medicine and Rehabilitation

## 2022-05-04 DIAGNOSIS — M25512 Pain in left shoulder: Secondary | ICD-10-CM

## 2022-05-04 DIAGNOSIS — M7918 Myalgia, other site: Secondary | ICD-10-CM | POA: Diagnosis not present

## 2022-05-04 DIAGNOSIS — M542 Cervicalgia: Secondary | ICD-10-CM | POA: Diagnosis not present

## 2022-05-04 DIAGNOSIS — M47816 Spondylosis without myelopathy or radiculopathy, lumbar region: Secondary | ICD-10-CM | POA: Diagnosis not present

## 2022-05-04 DIAGNOSIS — M5416 Radiculopathy, lumbar region: Secondary | ICD-10-CM

## 2022-05-04 DIAGNOSIS — M48062 Spinal stenosis, lumbar region with neurogenic claudication: Secondary | ICD-10-CM | POA: Diagnosis not present

## 2022-05-04 NOTE — Progress Notes (Unsigned)
Functional Pain Scale - descriptive words and definitions  Distressing (6)    Pain is present/unable to complete most ADLs limited by pain/sleep is difficult and active distraction is only marginal. Moderate range order  Average Pain 6-9  Lower back pain. MRI review
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
## 2022-05-06 ENCOUNTER — Ambulatory Visit: Payer: Medicare Other

## 2022-05-06 DIAGNOSIS — S161XXA Strain of muscle, fascia and tendon at neck level, initial encounter: Secondary | ICD-10-CM

## 2022-05-06 DIAGNOSIS — S39012A Strain of muscle, fascia and tendon of lower back, initial encounter: Secondary | ICD-10-CM
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
## 2022-05-08 ENCOUNTER — Ambulatory Visit: Payer: Medicare Other

## 2022-05-08 DIAGNOSIS — S161XXA Strain of muscle, fascia and tendon at neck level, initial encounter: Secondary | ICD-10-CM | POA: Diagnosis not present

## 2022-05-08 DIAGNOSIS — S39012A Strain of muscle, fascia and tendon of lower back, initial encounter: Secondary | ICD-10-CM
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
## 2022-05-11 DIAGNOSIS — M545 Low back pain, unspecified: Secondary | ICD-10-CM | POA: Diagnosis not present

## 2022-05-11 DIAGNOSIS — I959 Hypotension, unspecified: Secondary | ICD-10-CM | POA: Diagnosis not present

## 2022-05-11 DIAGNOSIS — E1142 Type 2 diabetes mellitus with diabetic polyneuropathy: Secondary | ICD-10-CM | POA: Diagnosis not present

## 2022-05-11 DIAGNOSIS — Z0001 Encounter for general adult medical examination with abnormal findings: Secondary | ICD-10-CM | POA: Diagnosis not present

## 2022-05-13 ENCOUNTER — Ambulatory Visit (INDEPENDENT_AMBULATORY_CARE_PROVIDER_SITE_OTHER): Payer: Medicare Other | Admitting: Podiatry

## 2022-05-13 ENCOUNTER — Ambulatory Visit: Payer: Medicare Other

## 2022-05-13 DIAGNOSIS — S161XXA Strain of muscle, fascia and tendon at neck level, initial encounter: Secondary | ICD-10-CM | POA: Diagnosis not present

## 2022-05-13 DIAGNOSIS — S39012A Strain of muscle, fascia and tendon of lower back, initial encounter: Secondary | ICD-10-CM | POA: Diagnosis not present

## 2022-05-13 DIAGNOSIS — M79674 Pain in right toe(s): Secondary | ICD-10-CM | POA: Diagnosis not present

## 2022-05-13 DIAGNOSIS — B351 Tinea unguium: Secondary | ICD-10-CM | POA: Diagnosis not present

## 2022-05-13 DIAGNOSIS — M79675 Pain in left toe(s): Secondary | ICD-10-CM
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
## 2022-05-13 NOTE — Progress Notes (Signed)
   Chief Complaint  Patient presents with   Diabetes    Diabetic foot care, A1c- 9.2 BG- 147 (yesterday), Nail trim, callus     SUBJECTIVE Patient presents to office today complaining of elongated, thickened nails that cause pain while ambulating in shoes.  Patient is unable to trim their own nails.   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 since 1996   Insulin dependent diabetes mellitus    Mitral valve prolapse    Neuropathy    Osteoarthritis    bilateral knee   Seasonal allergies    Sleep apnea    no CPAP use in several months, per pt.   Systemic lupus erythematosus (HCC)    Trigger finger, left middle finger 01/2017    OBJECTIVE General Patient is awake, alert, and oriented x 3 and in no acute distress. Derm Skin is dry and supple bilateral. Negative open lesions or macerations. Remaining integument unremarkable. Nails are tender, long, thickened and dystrophic with subungual debris, consistent with onychomycosis, 1-5 bilateral. No signs of infection noted. Vasc  DP and PT pedal pulses palpable bilaterally. Temperature gradient within normal limits.  Neuro Epicritic and protective threshold sensation grossly intact bilaterally.  Musculoskeletal Exam pes planus deformity noted to the bilateral feet left greater than the right.  There is collapse of the medial longitudinal arch and tenderness along the posterior tibial tendon of the left specifically  ASSESSMENT 1.  Pain due to onychomycosis of toenails both 2.  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  3. PTTD LLE 4.  Diabetes mellitus with  peripheral polyneuropathy  PLAN OF CARE 1. Patient evaluated today.  2. Instructed to maintain good pedal hygiene and foot care.  3. Mechanical debridement of nails 1-5 bilaterally performed using a nail nipper. Filed with dremel without incident.  4.  Continue wearing diabetic shoes and insoles 5.  Return to clinic in 3 months for routine foot care   Edrick Kins, DPM [facility]  Dr. Edrick Kins, DPM    [address], [place] [address]                Office [phone]  Fax [phone]

## 2022-05-15 ENCOUNTER — Ambulatory Visit: Payer: Medicare Other | Attending: Orthopaedic Surgery

## 2022-05-15 DIAGNOSIS — S39012A Strain of muscle, fascia and tendon of lower back, initial encounter: Secondary | ICD-10-CM | POA: Insufficient documentation

## 2022-05-15 DIAGNOSIS — S161XXA Strain of muscle, fascia and tendon at neck level, initial encounter: Secondary | ICD-10-CM | POA: Diagnosis present
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
## 2022-05-18 ENCOUNTER — Ambulatory Visit: Payer: Self-pay

## 2022-05-18 ENCOUNTER — Ambulatory Visit: Payer: Medicare Other

## 2022-05-18 ENCOUNTER — Encounter (INDEPENDENT_AMBULATORY_CARE_PROVIDER_SITE_OTHER): Payer: Medicare Other | Admitting: Ophthalmology

## 2022-05-18 ENCOUNTER — Ambulatory Visit (INDEPENDENT_AMBULATORY_CARE_PROVIDER_SITE_OTHER): Payer: Medicare Other | Admitting: Physical Medicine and Rehabilitation

## 2022-05-18 VITALS — BP 136/72 | HR 77

## 2022-05-18 DIAGNOSIS — S161XXA Strain of muscle, fascia and tendon at neck level, initial encounter: Secondary | ICD-10-CM

## 2022-05-18 DIAGNOSIS — M5416 Radiculopathy, lumbar region: Secondary | ICD-10-CM

## 2022-05-18 DIAGNOSIS — S39012A Strain of muscle, fascia and tendon of lower back, initial encounter: Secondary | ICD-10-CM | POA: Diagnosis not present

## 2022-05-18 MED ORDER — METHYLPREDNISOLONE ACETATE 80 MG/ML IJ SUSP
80.0000 mg | Freq: Once | INTRAMUSCULAR | Status: AC
  Administered 2022-05-18: 80 mg

## 2022-05-18 NOTE — Patient Instructions (Signed)
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
## 2022-05-18 NOTE — Progress Notes (Signed)
Functional Pain Scale - descriptive words and definitions  Distressing (6)    Pain is present/unable to complete most ADLs limited by pain/sleep is difficult and active distraction is only marginal. Moderate range order  Average Pain 6   +Driver, -BT, -Dye Allergies.  Lower back pain on both sides with no radiation. Lying on back makes pain worse
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
## 2022-05-22 ENCOUNTER — Ambulatory Visit: Payer: Medicare Other

## 2022-05-22 DIAGNOSIS — S39012A Strain of muscle, fascia and tendon of lower back, initial encounter: Secondary | ICD-10-CM | POA: Diagnosis not present

## 2022-05-22 DIAGNOSIS — S161XXA Strain of muscle, fascia and tendon at neck level, initial encounter: Secondary | ICD-10-CM

## 2022-05-25 ENCOUNTER — Ambulatory Visit: Payer: Medicare Other

## 2022-05-25 DIAGNOSIS — S161XXA Strain of muscle, fascia and tendon at neck level, initial encounter: Secondary | ICD-10-CM

## 2022-05-25 DIAGNOSIS — S39012A Strain of muscle, fascia and tendon of lower back, initial encounter: Secondary | ICD-10-CM

## 2022-05-25 NOTE — Progress Notes (Signed)
Kaitlyn Jackson - 77 y.o. female MRN [id number]  Date of birth: 12/03/1945  Office Visit Note: Visit Date: 05/18/2022 PCP: Lucianne Lei, MD Referred by: Lucianne Lei, MD  Subjective: Chief Complaint  Patient presents with   Lower Back - Pain   HPI:  [name] is a 77 y.o. female who comes in today at the request of Barnet Pall, FNP for planned Bilateral L4-5 Lumbar Transforaminal epidural steroid injection with fluoroscopic guidance.  The patient has failed conservative care including home exercise, medications, time and activity modification.  This injection will be diagnostic and hopefully therapeutic.  Please see requesting physician notes for further details and justification.  Her case is complicated by diabetes as well as chronic Coumadin use.  Did not need to be off anticoagulation for this injection.   ROS Otherwise per HPI.  Assessment & Plan: Visit Diagnoses:    ICD-10-CM   1. Lumbar radiculopathy  M54.16 XR C-ARM NO REPORT    Epidural Steroid injection    methylPREDNISolone acetate (DEPO-MEDROL) injection 80 mg      Plan: No additional findings.   Meds & Orders:  Meds ordered this encounter  Medications   methylPREDNISolone acetate (DEPO-MEDROL) injection 80 mg    Orders Placed This Encounter  Procedures   XR C-ARM NO REPORT   Epidural Steroid injection    Follow-up: Return for visit to requesting provider as needed.   Procedures: No procedures performed  Lumbosacral Transforaminal Epidural Steroid Injection - Sub-Pedicular Approach with Fluoroscopic Guidance  [name]      Date of Birth: Sep 02, 1945 MRN: [id number] PCP: Lucianne Lei, MD      Visit Date: 05/18/2022   Universal Protocol:    Date/Time: 05/18/2022  Consent Given By: the patient  Position: PRONE  Additional Comments: Vital signs were monitored before and after the procedure. Patient was prepped and draped in the usual sterile fashion. The correct  patient, procedure, and site was verified.   Injection Procedure Details:   Procedure diagnoses: Lumbar radiculopathy [M54.16]    Meds Administered:  Meds ordered this encounter  Medications   methylPREDNISolone acetate (DEPO-MEDROL) injection 80 mg    Laterality: Bilateral  Location/Site: L4  Needle:5.0 in., 22 ga.  Short bevel or Quincke spinal needle  Needle Placement: Transforaminal  Findings:    -Comments: Excellent flow of contrast along the nerve, nerve root and into the epidural space.  Procedure Details: After squaring off the end-plates to get a true AP view, the C-arm was positioned so that an oblique view of the foramen as noted above was visualized. The target area is just inferior to the "nose of the scotty dog" or sub pedicular. The soft tissues overlying this structure were infiltrated with 2-3 ml. of 1% Lidocaine without Epinephrine.  The spinal needle was inserted toward the target using a "trajectory" view along the fluoroscope beam.  Under AP and lateral visualization, the needle was advanced so it did not puncture dura and was located close the 6 O'Clock position of the pedical in AP tracterory. Biplanar projections were used to confirm position. Aspiration was confirmed to be negative for CSF and/or blood. A 1-2 ml. volume of Isovue-250 was injected and flow of contrast was noted at each level. Radiographs were obtained for documentation purposes.   After attaining the desired flow of contrast documented above, a 0.5 to 1.0 ml test dose of 0.25% Marcaine was injected into each respective transforaminal space.  The patient was observed for 90 seconds post  injection.  After no sensory deficits were reported, and normal lower extremity motor function was noted,   the above injectate was administered so that equal amounts of the injectate were placed at each foramen (level) into the transforaminal epidural space.   Additional Comments:  No complications  occurred Dressing: 2 x 2 sterile gauze and Band-Aid    Post-procedure details: Patient was observed during the procedure. Post-procedure instructions were reviewed.  Patient left the clinic in stable condition.    Clinical History: EXAM: MRI LUMBAR SPINE WITHOUT CONTRAST   TECHNIQUE: Multiplanar, multisequence MR imaging of the lumbar spine was performed. No intravenous contrast was administered.   COMPARISON:  CT scan 02/14/2022   FINDINGS: Segmentation: There are five lumbar type vertebral bodies. The last full intervertebral disc space is labeled L5-S1.   Alignment:  Normal   Vertebrae: Normal marrow signal. No bone lesions or fractures. 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   Conus medullaris and cauda equina: Conus extends to the top of L2 level. Conus and cauda equina appear normal.   Paraspinal and other soft tissues: No significant paraspinal or retroperitoneal findings.   Disc levels:   T12-L1: Degenerative disc disease with bulging annulus and osteophytic ridging but no significant spinal or foraminal stenosis.   L1-2: Moderate facet disease but no disc protrusions, spinal foraminal stenosis.   L2-3: Mild annular bulge and moderate to advanced facet disease. Mild bilateral lateral recess stenosis but no spinal or foraminal stenosis.   L3-4: Annular bulge and moderate to advanced facet disease contributing to mild bilateral lateral recess stenosis. No significant spinal or foraminal stenosis.   L4-5: Bulging degenerated uncovered disc, severe facet disease and ligamentum flavum thickening contributing to moderate to moderately severe spinal and bilateral lateral recess stenosis. There is also mild bilateral foraminal stenosis.   L5-S1: Right-sided laminectomy defect. Moderate facet disease but no disc protrusions, spinal or foraminal stenosis.   IMPRESSION: 1. 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 There is also mild bilateral foraminal stenosis. 2. Mild bilateral lateral recess stenosis at L2-3 and L3-4. 3. Right-sided laminectomy defect at L5-S1. No spinal or foraminal stenosis.     Electronically Signed   By: Marijo Sanes M.D.   On: 05/01/2022 13:12     Objective:  VS:  HT:    WT:   BMI:     BP:136/72  HR:77bpm  TEMP: ( )  RESP:  Physical Exam Vitals and nursing note reviewed.  Constitutional:      General: She is not in acute distress.    Appearance: Normal appearance. She is not ill-appearing.  HENT:     Head: Normocephalic and atraumatic.     Right Ear: External ear normal.     Left Ear: External ear normal.  Eyes:     Extraocular Movements: Extraocular movements intact.  Cardiovascular:     Rate and Rhythm: Normal rate.     Pulses: Normal pulses.  Pulmonary:     Effort: Pulmonary effort is normal. No respiratory distress.  Abdominal:     General: There is no distension.     Palpations: Abdomen is soft.  Musculoskeletal:        General: Tenderness present.     Cervical back: Neck supple.     Right lower leg: No edema.     Left lower leg: No edema.     Comments: Patient has good distal strength with no pain over the greater trochanters.  No clonus or focal weakness.  Skin:    Findings: No erythema, lesion or rash.  Neurological:     General: No focal deficit present.     Mental Status: She is alert and oriented to person, place, and time.     Sensory: No sensory deficit.     Motor: No weakness or abnormal muscle tone.     Coordination: Coordination normal.  Psychiatric:        Mood and Affect: Mood normal.        Behavior: Behavior normal.      Imaging: No results found.
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
## 2022-05-25 NOTE — Procedures (Signed)
Lumbosacral Transforaminal Epidural Steroid Injection - Sub-Pedicular Approach with Fluoroscopic Guidance  [name]      Date of Birth: [birth year] MRN: [id number] PCP: Lucianne Lei, MD      Visit Date: 05/18/2022   Universal Protocol:    Date/Time: 05/18/2022  Consent Given By: the patient  Position: PRONE  Additional Comments: Vital signs were monitored before and after the procedure. Patient was prepped and draped in the usual sterile fashion. The correct patient, procedure, and site was verified.   Injection Procedure Details:   Procedure diagnoses: Lumbar radiculopathy [M54.16]    Meds Administered:  Meds ordered this encounter  Medications   methylPREDNISolone acetate (DEPO-MEDROL) injection 80 mg    Laterality: Bilateral  Location/Site: L4  Needle:5.0 in., 22 ga.  Short bevel or Quincke spinal needle  Needle Placement: Transforaminal  Findings:    -Comments: Excellent flow of contrast along the nerve, nerve root and into the epidural space.  Procedure Details: After squaring off the end-plates to get a true AP view, the C-arm was positioned so that an oblique view of the foramen as noted above was visualized. The target area is just inferior to the "nose of the scotty dog" or sub pedicular. The soft tissues overlying this structure were infiltrated with 2-3 ml. of 1% Lidocaine without Epinephrine.  The spinal needle was inserted toward the target using a "trajectory" view along the fluoroscope beam.  Under AP and lateral visualization, the needle was advanced so it did not puncture dura and was located close the 6 O'Clock position of the pedical in AP tracterory. Biplanar projections were used to confirm position. Aspiration was confirmed to be negative for CSF and/or blood. A 1-2 ml. volume of Isovue-250 was injected and flow of contrast was noted at each level. Radiographs were obtained for documentation purposes.   After attaining the desired  flow of contrast documented above, a 0.5 to 1.0 ml test dose of 0.25% Marcaine was injected into each respective transforaminal space.  The patient was observed for 90 seconds post injection.  After no sensory deficits were reported, and normal lower extremity motor function was noted,   the above injectate was administered so that equal amounts of the injectate were placed at each foramen (level) into the transforaminal epidural space.   Additional Comments:  No complications occurred Dressing: 2 x 2 sterile gauze and Band-Aid    Post-procedure details: Patient was observed during the procedure. Post-procedure instructions were reviewed.  Patient left the clinic in stable condition.
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
## 2022-05-29 ENCOUNTER — Ambulatory Visit: Payer: Medicare Other

## 2022-05-29 DIAGNOSIS — S161XXA Strain of muscle, fascia and tendon at neck level, initial encounter: Secondary | ICD-10-CM

## 2022-05-29 DIAGNOSIS — S39012A Strain of muscle, fascia and tendon of lower back, initial encounter: Secondary | ICD-10-CM

## 2022-06-01 ENCOUNTER — Ambulatory Visit: Payer: Medicare Other

## 2022-06-01 DIAGNOSIS — S39012A Strain of muscle, fascia and tendon of lower back, initial encounter: Secondary | ICD-10-CM | POA: Diagnosis not present

## 2022-06-01 DIAGNOSIS — S161XXA Strain of muscle, fascia and tendon at neck level, initial encounter: Secondary | ICD-10-CM | POA: Diagnosis not present
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
## 2022-06-02 ENCOUNTER — Telehealth: Payer: Self-pay | Admitting: Physical Medicine and Rehabilitation

## 2022-06-02 NOTE — Telephone Encounter (Signed)
Patient called advised she is still having lower back pain. Patient said her pain level is between a 6 and 7. Patient asked for a call back as soon as possible. The number to contact patient is [phone]

## 2022-06-04 ENCOUNTER — Other Ambulatory Visit: Payer: Self-pay | Admitting: Physical Medicine and Rehabilitation

## 2022-06-04 DIAGNOSIS — M5416 Radiculopathy, lumbar region: Secondary | ICD-10-CM

## 2022-06-04 DIAGNOSIS — M48062 Spinal stenosis, lumbar region with neurogenic claudication: Secondary | ICD-10-CM

## 2022-06-04 NOTE — Telephone Encounter (Signed)
Spoke with patient and she stated the injection really never took the pain away. She did not get at least 70% relief from the injection. Please advise

## 2022-06-04 NOTE — Progress Notes (Signed)
Spoke with patient via telephone today. Less than 50% relief of pain with recent bilateral L4 transforaminal epidural steroid injection on 05/18/2022. We would like to try interlaminar approach, will need to get permission for her to come off Coumadin.

## 2022-06-04 NOTE — Telephone Encounter (Signed)
Informed patient of information below. Fax sent to Dr. Criss Rosales to stop Warfarin for 5 days

## 2022-06-05 ENCOUNTER — Ambulatory Visit: Payer: Medicare Other

## 2022-06-08 ENCOUNTER — Encounter (INDEPENDENT_AMBULATORY_CARE_PROVIDER_SITE_OTHER): Payer: Medicare Other | Admitting: Ophthalmology

## 2022-06-08 DIAGNOSIS — E7800 Pure hypercholesterolemia, unspecified: Secondary | ICD-10-CM | POA: Diagnosis not present

## 2022-06-08 DIAGNOSIS — E1165 Type 2 diabetes mellitus with hyperglycemia: Secondary | ICD-10-CM | POA: Diagnosis not present

## 2022-06-08 DIAGNOSIS — E113311 Type 2 diabetes mellitus with moderate nonproliferative diabetic retinopathy with macular edema, right eye: Secondary | ICD-10-CM | POA: Diagnosis not present

## 2022-06-08 DIAGNOSIS — H43813 Vitreous degeneration, bilateral: Secondary | ICD-10-CM

## 2022-06-08 DIAGNOSIS — Z86718 Personal history of other venous thrombosis and embolism: Secondary | ICD-10-CM | POA: Diagnosis not present

## 2022-06-08 DIAGNOSIS — E113392 Type 2 diabetes mellitus with moderate nonproliferative diabetic retinopathy without macular edema, left eye: Secondary | ICD-10-CM | POA: Diagnosis not present

## 2022-06-08 DIAGNOSIS — Z7901 Long term (current) use of anticoagulants: Secondary | ICD-10-CM | POA: Diagnosis not present

## 2022-06-08 DIAGNOSIS — M5451 Vertebrogenic low back pain: Secondary | ICD-10-CM | POA: Diagnosis not present

## 2022-06-08 DIAGNOSIS — H35033 Hypertensive retinopathy, bilateral: Secondary | ICD-10-CM

## 2022-06-08 DIAGNOSIS — I82509 Chronic embolism and thrombosis of unspecified deep veins of unspecified lower extremity: Secondary | ICD-10-CM | POA: Diagnosis not present

## 2022-06-08 DIAGNOSIS — D511 Vitamin B12 deficiency anemia due to selective vitamin B12 malabsorption with proteinuria: Secondary | ICD-10-CM | POA: Diagnosis not present

## 2022-06-08 DIAGNOSIS — I10 Essential (primary) hypertension: Secondary | ICD-10-CM

## 2022-06-08 DIAGNOSIS — H33302 Unspecified retinal break, left eye: Secondary | ICD-10-CM

## 2022-06-16 ENCOUNTER — Telehealth: Payer: Self-pay | Admitting: Physical Medicine and Rehabilitation

## 2022-06-16 NOTE — Telephone Encounter (Signed)
Spoke with patient and scheduled injection for 06/29/22. Patient aware driver needed. Patient informed to stop Coumadin on 06/24/22

## 2022-06-16 NOTE — Telephone Encounter (Signed)
Patient called in asking has there been any update on if insurance approved her injection please advise

## 2022-06-29 ENCOUNTER — Other Ambulatory Visit: Payer: Self-pay

## 2022-06-29 ENCOUNTER — Ambulatory Visit: Payer: Medicare Other | Admitting: Physical Medicine and Rehabilitation

## 2022-06-29 VITALS — BP 113/64 | HR 82

## 2022-06-29 MED ORDER — METHYLPREDNISOLONE ACETATE 80 MG/ML IJ SUSP: 80.0000 mg | Freq: Once | INTRAMUSCULAR | Status: DC

## 2022-06-29 NOTE — Progress Notes (Signed)
Functional Pain Scale - descriptive words and definitions  Distracting (5)    Aware of pain/able to complete some ADL's but limited by pain/sleep is affected and active distractions are only slightly useful. Moderate range order  Average Pain 6-7   +Driver, -BT, -Dye Allergies.

## 2022-07-02 ENCOUNTER — Ambulatory Visit (INDEPENDENT_AMBULATORY_CARE_PROVIDER_SITE_OTHER): Payer: Medicare Other | Admitting: Physical Medicine and Rehabilitation

## 2022-07-02 ENCOUNTER — Other Ambulatory Visit: Payer: Self-pay

## 2022-07-02 VITALS — BP 132/67 | HR 91

## 2022-07-02 DIAGNOSIS — M5416 Radiculopathy, lumbar region: Secondary | ICD-10-CM

## 2022-07-02 MED ORDER — METHYLPREDNISOLONE ACETATE 80 MG/ML IJ SUSP
80.0000 mg | Freq: Once | INTRAMUSCULAR | Status: AC
  Administered 2022-07-02: 80 mg

## 2022-07-02 NOTE — Patient Instructions (Signed)

## 2022-07-02 NOTE — Progress Notes (Signed)
Functional Pain Scale - descriptive words and definitions  Distracting (5)    Aware of pain/able to complete some ADL's but limited by pain/sleep is affected and active distractions are only slightly useful. Moderate range order  Average Pain 8   +Driver, -BT- stopped on 1/61/09, -Dye Allergies.  Lower back pain on left that can radiate into left leg at times

## 2022-07-06 ENCOUNTER — Encounter (INDEPENDENT_AMBULATORY_CARE_PROVIDER_SITE_OTHER): Payer: Medicare Other | Admitting: Ophthalmology

## 2022-07-06 DIAGNOSIS — H33302 Unspecified retinal break, left eye: Secondary | ICD-10-CM | POA: Diagnosis not present

## 2022-07-06 DIAGNOSIS — E113311 Type 2 diabetes mellitus with moderate nonproliferative diabetic retinopathy with macular edema, right eye: Secondary | ICD-10-CM

## 2022-07-06 DIAGNOSIS — E113392 Type 2 diabetes mellitus with moderate nonproliferative diabetic retinopathy without macular edema, left eye: Secondary | ICD-10-CM

## 2022-07-06 DIAGNOSIS — H43813 Vitreous degeneration, bilateral: Secondary | ICD-10-CM

## 2022-07-06 DIAGNOSIS — H35372 Puckering of macula, left eye: Secondary | ICD-10-CM | POA: Diagnosis not present

## 2022-07-06 DIAGNOSIS — H35033 Hypertensive retinopathy, bilateral: Secondary | ICD-10-CM

## 2022-07-06 DIAGNOSIS — I10 Essential (primary) hypertension: Secondary | ICD-10-CM

## 2022-07-12 NOTE — Progress Notes (Signed)
Kaitlyn Jackson - 77 y.o. female MRN [id number]  Date of birth: Apr 02, 1945  Office Visit Note: Visit Date: 07/02/2022 PCP: Renaye Rakers, MD Referred by: Renaye Rakers, MD  Subjective: Chief Complaint  Patient presents with   Lower Back - Pain   HPI:  Kaitlyn Jackson is a 77 y.o. female who comes in today at the request of Ellin Goodie, FNP for planned Left L5-S1 Lumbar Interlaminar epidural steroid injection with fluoroscopic guidance.  The patient has failed conservative care including home exercise, medications, time and activity modification.  This injection will be diagnostic and hopefully therapeutic.  Please see requesting physician notes for further details and justification.  She did stop her Coumadin this time as instructed when we saw her the other day.  She had forgotten to stop her Coumadin at that point.  She had a fairly low INR anyway.  She will restart that tonight.   ROS Otherwise per HPI.  Assessment & Plan: Visit Diagnoses:    ICD-10-CM   1. Lumbar radiculopathy  M54.16 XR C-ARM NO REPORT    Epidural Steroid injection    methylPREDNISolone acetate (DEPO-MEDROL) injection 80 mg      Plan: No additional findings.   Meds & Orders:  Meds ordered this encounter  Medications   methylPREDNISolone acetate (DEPO-MEDROL) injection 80 mg    Orders Placed This Encounter  Procedures   XR C-ARM NO REPORT   Epidural Steroid injection    Follow-up: Return for visit to requesting provider as needed.   Procedures: No procedures performed  Lumbar Epidural Steroid Injection - Interlaminar Approach with Fluoroscopic Guidance  [name]      Date of Birth: August 15, 1945 MRN: [id number] PCP: Renaye Rakers, MD      Visit Date: 07/02/2022   Universal Protocol:     Consent Given By: the patient  Position: PRONE  Additional Comments: Vital signs were monitored before and after the procedure. Patient was prepped and draped in the usual sterile  fashion. The correct patient, procedure, and site was verified.   Injection Procedure Details:   Procedure diagnoses: Lumbar radiculopathy [M54.16]   Meds Administered:  Meds ordered this encounter  Medications   methylPREDNISolone acetate (DEPO-MEDROL) injection 80 mg     Laterality: Left  Location/Site:  L5-S1  Needle: 3.5 in., 20 ga. Tuohy  Needle Placement: Paramedian epidural  Findings:   -Comments: Excellent flow of contrast into the epidural space.  Procedure Details: Using a paramedian approach from the side mentioned above, the region overlying the inferior lamina was localized under fluoroscopic visualization and the soft tissues overlying this structure were infiltrated with 4 ml. of 1% Lidocaine without Epinephrine. The Tuohy needle was inserted into the epidural space using a paramedian approach.   The epidural space was localized using loss of resistance along with counter oblique bi-planar fluoroscopic views.  After negative aspirate for air, blood, and CSF, a 2 ml. volume of Isovue-250 was injected into the epidural space and the flow of contrast was observed. Radiographs were obtained for documentation purposes.    The injectate was administered into the level noted above.   Additional Comments:  No complications occurred Dressing: 2 x 2 sterile gauze and Band-Aid    Post-procedure details: Patient was observed during the procedure. Post-procedure instructions were reviewed.  Patient left the clinic in stable condition.   Clinical History: EXAM: MRI LUMBAR SPINE WITHOUT CONTRAST   TECHNIQUE: Multiplanar, multisequence MR imaging of the lumbar spine was performed. No intravenous contrast  was administered.   COMPARISON:  CT scan 02/14/2022   FINDINGS: Segmentation: There are five lumbar type vertebral bodies. The last full intervertebral disc space is labeled L5-S1.   Alignment:  Normal   Vertebrae: Normal marrow signal. No bone lesions or  fractures. 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   Conus medullaris and cauda equina: Conus extends to the top of L2 level. Conus and cauda equina appear normal.   Paraspinal and other soft tissues: No significant paraspinal or retroperitoneal findings.   Disc levels:   T12-L1: Degenerative disc disease with bulging annulus and osteophytic ridging but no significant spinal or foraminal stenosis.   L1-2: Moderate facet disease but no disc protrusions, spinal foraminal stenosis.   L2-3: Mild annular bulge and moderate to advanced facet disease. Mild bilateral lateral recess stenosis but no spinal or foraminal stenosis.   L3-4: Annular bulge and moderate to advanced facet disease contributing to mild bilateral lateral recess stenosis. No significant spinal or foraminal stenosis.   L4-5: Bulging degenerated uncovered disc, severe facet disease and ligamentum flavum thickening contributing to moderate to moderately severe spinal and bilateral lateral recess stenosis. There is also mild bilateral foraminal stenosis.   L5-S1: Right-sided laminectomy defect. Moderate facet disease but no disc protrusions, spinal or foraminal stenosis.   IMPRESSION: 1. 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 There is also mild bilateral foraminal stenosis. 2. Mild bilateral lateral recess stenosis at L2-3 and L3-4. 3. Right-sided laminectomy defect at L5-S1. No spinal or foraminal stenosis.     Electronically Signed   By: Rudie Meyer M.D.   On: 05/01/2022 13:12     Objective:  VS:  HT:    WT:   BMI:     BP:132/67  HR:91bpm  TEMP: ( )  RESP:  Physical Exam Vitals and nursing note reviewed.  Constitutional:      General: She is not in acute distress.    Appearance: Normal appearance. She is obese. She is not ill-appearing.  HENT:     Head: Normocephalic and atraumatic.     Right  Ear: External ear normal.     Left Ear: External ear normal.  Eyes:     Extraocular Movements: Extraocular movements intact.  Cardiovascular:     Rate and Rhythm: Normal rate.     Pulses: Normal pulses.  Pulmonary:     Effort: Pulmonary effort is normal. No respiratory distress.  Abdominal:     General: There is no distension.     Palpations: Abdomen is soft.  Musculoskeletal:        General: Tenderness present.     Cervical back: Neck supple.     Right lower leg: No edema.     Left lower leg: No edema.     Comments: Patient has good distal strength with no pain over the greater trochanters.  No clonus or focal weakness.  Skin:    Findings: No erythema, lesion or rash.  Neurological:     General: No focal deficit present.     Mental Status: She is alert and oriented to person, place, and time.     Sensory: No sensory deficit.     Motor: No weakness or abnormal muscle tone.     Coordination: Coordination normal.  Psychiatric:        Mood and Affect: Mood normal.        Behavior: Behavior normal.      Imaging: No results found.

## 2022-07-12 NOTE — Procedures (Signed)
Lumbar Epidural Steroid Injection - Interlaminar Approach with Fluoroscopic Guidance  [name]      Date of Birth: 02-Oct-1945 MRN: [id number] PCP: Renaye Rakers, MD      Visit Date: 07/02/2022   Universal Protocol:     Consent Given By: the patient  Position: PRONE  Additional Comments: Vital signs were monitored before and after the procedure. Patient was prepped and draped in the usual sterile fashion. The correct patient, procedure, and site was verified.   Injection Procedure Details:   Procedure diagnoses: Lumbar radiculopathy [M54.16]   Meds Administered:  Meds ordered this encounter  Medications   methylPREDNISolone acetate (DEPO-MEDROL) injection 80 mg     Laterality: Left  Location/Site:  L5-S1  Needle: 3.5 in., 20 ga. Tuohy  Needle Placement: Paramedian epidural  Findings:   -Comments: Excellent flow of contrast into the epidural space.  Procedure Details: Using a paramedian approach from the side mentioned above, the region overlying the inferior lamina was localized under fluoroscopic visualization and the soft tissues overlying this structure were infiltrated with 4 ml. of 1% Lidocaine without Epinephrine. The Tuohy needle was inserted into the epidural space using a paramedian approach.   The epidural space was localized using loss of resistance along with counter oblique bi-planar fluoroscopic views.  After negative aspirate for air, blood, and CSF, a 2 ml. volume of Isovue-250 was injected into the epidural space and the flow of contrast was observed. Radiographs were obtained for documentation purposes.    The injectate was administered into the level noted above.   Additional Comments:  No complications occurred Dressing: 2 x 2 sterile gauze and Band-Aid    Post-procedure details: Patient was observed during the procedure. Post-procedure instructions were reviewed.  Patient left the clinic in stable condition.

## 2022-08-03 ENCOUNTER — Encounter (INDEPENDENT_AMBULATORY_CARE_PROVIDER_SITE_OTHER): Payer: Medicare Other | Admitting: Ophthalmology

## 2022-08-03 DIAGNOSIS — H43813 Vitreous degeneration, bilateral: Secondary | ICD-10-CM

## 2022-08-03 DIAGNOSIS — E113311 Type 2 diabetes mellitus with moderate nonproliferative diabetic retinopathy with macular edema, right eye: Secondary | ICD-10-CM | POA: Diagnosis not present

## 2022-08-03 DIAGNOSIS — H35033 Hypertensive retinopathy, bilateral: Secondary | ICD-10-CM | POA: Diagnosis not present

## 2022-08-03 DIAGNOSIS — H33302 Unspecified retinal break, left eye: Secondary | ICD-10-CM | POA: Diagnosis not present

## 2022-08-03 DIAGNOSIS — E113392 Type 2 diabetes mellitus with moderate nonproliferative diabetic retinopathy without macular edema, left eye: Secondary | ICD-10-CM | POA: Diagnosis not present

## 2022-08-03 DIAGNOSIS — Z794 Long term (current) use of insulin: Secondary | ICD-10-CM

## 2022-08-03 DIAGNOSIS — I10 Essential (primary) hypertension: Secondary | ICD-10-CM

## 2022-08-12 ENCOUNTER — Ambulatory Visit (INDEPENDENT_AMBULATORY_CARE_PROVIDER_SITE_OTHER): Payer: Medicare Other | Admitting: Podiatry

## 2022-08-12 DIAGNOSIS — M79674 Pain in right toe(s): Secondary | ICD-10-CM | POA: Diagnosis not present

## 2022-08-12 DIAGNOSIS — B351 Tinea unguium: Secondary | ICD-10-CM

## 2022-08-12 DIAGNOSIS — M79675 Pain in left toe(s): Secondary | ICD-10-CM

## 2022-08-12 DIAGNOSIS — E119 Type 2 diabetes mellitus without complications: Secondary | ICD-10-CM | POA: Diagnosis not present

## 2022-08-12 NOTE — Progress Notes (Signed)
   No chief complaint on file.   SUBJECTIVE Patient presents to office today complaining of elongated, thickened nails that cause pain while ambulating in shoes.  Patient is unable to trim their own nails.   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 since 1996   Insulin dependent diabetes mellitus    Mitral valve prolapse    Neuropathy    Osteoarthritis    bilateral knee   Seasonal allergies    Sleep apnea    no CPAP use in several months, per pt.   Systemic lupus erythematosus (HCC)    Trigger finger, left middle finger 01/2017    OBJECTIVE General Patient is awake, alert, and oriented x 3 and in no acute distress. Derm Skin is dry and supple bilateral. Negative open lesions or macerations. Remaining integument unremarkable. Nails are tender, long, thickened and dystrophic with subungual debris, consistent with onychomycosis, 1-5 bilateral. No signs of infection noted. Vasc  DP and PT pedal pulses palpable bilaterally. Temperature gradient within normal limits.  Neuro Epicritic and protective threshold sensation grossly intact bilaterally.  Musculoskeletal Exam pes planus deformity noted to the bilateral feet left greater than the right.  There is collapse of the medial longitudinal arch and tenderness along the posterior tibial tendon of the left specifically  ASSESSMENT 1.  Pain due to onychomycosis of toenails both 2.  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  3. PTTD LLE 4.  Diabetes mellitus with peripheral polyneuropathy  PLAN OF CARE 1. Patient evaluated today.  Comprehensive diabetic  foot exam performed today 2. Instructed to maintain good pedal hygiene and foot care.  3. Mechanical debridement of nails 1-5 bilaterally performed using a nail nipper. Filed with dremel without incident.  4.  Continue wearing diabetic shoes and insoles 5.  Return to clinic in 3 months for routine foot care   Felecia Shelling, DPM [facility]  Dr. Felecia Shelling, DPM    [address] [place], [place] [address]                Office [phone]  Fax [phone]

## 2022-08-31 ENCOUNTER — Encounter (INDEPENDENT_AMBULATORY_CARE_PROVIDER_SITE_OTHER): Payer: 59 | Admitting: Ophthalmology

## 2022-08-31 DIAGNOSIS — H33302 Unspecified retinal break, left eye: Secondary | ICD-10-CM

## 2022-08-31 DIAGNOSIS — Z794 Long term (current) use of insulin: Secondary | ICD-10-CM

## 2022-08-31 DIAGNOSIS — I10 Essential (primary) hypertension: Secondary | ICD-10-CM

## 2022-08-31 DIAGNOSIS — E113292 Type 2 diabetes mellitus with mild nonproliferative diabetic retinopathy without macular edema, left eye: Secondary | ICD-10-CM | POA: Diagnosis not present

## 2022-08-31 DIAGNOSIS — H35033 Hypertensive retinopathy, bilateral: Secondary | ICD-10-CM | POA: Diagnosis not present

## 2022-08-31 DIAGNOSIS — E113311 Type 2 diabetes mellitus with moderate nonproliferative diabetic retinopathy with macular edema, right eye: Secondary | ICD-10-CM

## 2022-08-31 DIAGNOSIS — H43813 Vitreous degeneration, bilateral: Secondary | ICD-10-CM

## 2022-09-08 DIAGNOSIS — R634 Abnormal weight loss: Secondary | ICD-10-CM | POA: Diagnosis not present

## 2022-09-08 DIAGNOSIS — E039 Hypothyroidism, unspecified: Secondary | ICD-10-CM | POA: Diagnosis not present

## 2022-09-08 DIAGNOSIS — E559 Vitamin D deficiency, unspecified: Secondary | ICD-10-CM | POA: Diagnosis not present

## 2022-09-08 DIAGNOSIS — I10 Essential (primary) hypertension: Secondary | ICD-10-CM | POA: Diagnosis not present

## 2022-09-08 DIAGNOSIS — M545 Low back pain, unspecified: Secondary | ICD-10-CM | POA: Diagnosis not present

## 2022-09-08 DIAGNOSIS — E1142 Type 2 diabetes mellitus with diabetic polyneuropathy: Secondary | ICD-10-CM | POA: Diagnosis not present

## 2022-09-08 DIAGNOSIS — E1169 Type 2 diabetes mellitus with other specified complication: Secondary | ICD-10-CM | POA: Diagnosis not present

## 2022-09-14 DIAGNOSIS — D511 Vitamin B12 deficiency anemia due to selective vitamin B12 malabsorption with proteinuria: Secondary | ICD-10-CM | POA: Diagnosis not present

## 2022-09-14 DIAGNOSIS — E1169 Type 2 diabetes mellitus with other specified complication: Secondary | ICD-10-CM | POA: Diagnosis not present

## 2022-09-14 DIAGNOSIS — I10 Essential (primary) hypertension: Secondary | ICD-10-CM | POA: Diagnosis not present

## 2022-09-28 ENCOUNTER — Encounter (INDEPENDENT_AMBULATORY_CARE_PROVIDER_SITE_OTHER): Payer: 59 | Admitting: Ophthalmology

## 2022-09-28 DIAGNOSIS — E113211 Type 2 diabetes mellitus with mild nonproliferative diabetic retinopathy with macular edema, right eye: Secondary | ICD-10-CM | POA: Diagnosis not present

## 2022-09-28 DIAGNOSIS — E113292 Type 2 diabetes mellitus with mild nonproliferative diabetic retinopathy without macular edema, left eye: Secondary | ICD-10-CM

## 2022-09-28 DIAGNOSIS — H43813 Vitreous degeneration, bilateral: Secondary | ICD-10-CM

## 2022-09-28 DIAGNOSIS — H35033 Hypertensive retinopathy, bilateral: Secondary | ICD-10-CM

## 2022-09-28 DIAGNOSIS — Z794 Long term (current) use of insulin: Secondary | ICD-10-CM

## 2022-09-29 DIAGNOSIS — E1169 Type 2 diabetes mellitus with other specified complication: Secondary | ICD-10-CM | POA: Diagnosis not present

## 2022-09-29 DIAGNOSIS — I10 Essential (primary) hypertension: Secondary | ICD-10-CM | POA: Diagnosis not present

## 2022-10-07 DIAGNOSIS — E1169 Type 2 diabetes mellitus with other specified complication: Secondary | ICD-10-CM | POA: Diagnosis not present

## 2022-10-13 DIAGNOSIS — M65321 Trigger finger, right index finger: Secondary | ICD-10-CM | POA: Diagnosis not present

## 2022-10-13 DIAGNOSIS — E1169 Type 2 diabetes mellitus with other specified complication: Secondary | ICD-10-CM | POA: Diagnosis not present

## 2022-10-13 DIAGNOSIS — I10 Essential (primary) hypertension: Secondary | ICD-10-CM | POA: Diagnosis not present

## 2022-10-16 DIAGNOSIS — M65321 Trigger finger, right index finger: Secondary | ICD-10-CM | POA: Diagnosis not present

## 2022-10-19 ENCOUNTER — Other Ambulatory Visit: Payer: Self-pay | Admitting: Orthopedic Surgery

## 2022-10-21 DIAGNOSIS — E1169 Type 2 diabetes mellitus with other specified complication: Secondary | ICD-10-CM | POA: Diagnosis not present

## 2022-10-26 ENCOUNTER — Encounter (INDEPENDENT_AMBULATORY_CARE_PROVIDER_SITE_OTHER): Payer: 59 | Admitting: Ophthalmology

## 2022-10-26 DIAGNOSIS — I10 Essential (primary) hypertension: Secondary | ICD-10-CM

## 2022-10-26 DIAGNOSIS — E113311 Type 2 diabetes mellitus with moderate nonproliferative diabetic retinopathy with macular edema, right eye: Secondary | ICD-10-CM

## 2022-10-26 DIAGNOSIS — Z794 Long term (current) use of insulin: Secondary | ICD-10-CM | POA: Diagnosis not present

## 2022-10-26 DIAGNOSIS — H35033 Hypertensive retinopathy, bilateral: Secondary | ICD-10-CM | POA: Diagnosis not present

## 2022-10-26 DIAGNOSIS — E113392 Type 2 diabetes mellitus with moderate nonproliferative diabetic retinopathy without macular edema, left eye: Secondary | ICD-10-CM

## 2022-10-26 DIAGNOSIS — H43813 Vitreous degeneration, bilateral: Secondary | ICD-10-CM

## 2022-10-26 DIAGNOSIS — H33302 Unspecified retinal break, left eye: Secondary | ICD-10-CM

## 2022-11-02 DIAGNOSIS — E1169 Type 2 diabetes mellitus with other specified complication: Secondary | ICD-10-CM | POA: Diagnosis not present

## 2022-11-02 DIAGNOSIS — F4381 Prolonged grief disorder: Secondary | ICD-10-CM | POA: Diagnosis not present

## 2022-11-02 DIAGNOSIS — M65321 Trigger finger, right index finger: Secondary | ICD-10-CM | POA: Diagnosis not present

## 2022-11-02 DIAGNOSIS — I10 Essential (primary) hypertension: Secondary | ICD-10-CM | POA: Diagnosis not present

## 2022-11-09 DIAGNOSIS — E1169 Type 2 diabetes mellitus with other specified complication: Secondary | ICD-10-CM | POA: Diagnosis not present

## 2022-11-12 ENCOUNTER — Encounter (HOSPITAL_BASED_OUTPATIENT_CLINIC_OR_DEPARTMENT_OTHER): Payer: Self-pay | Admitting: Orthopedic Surgery

## 2022-11-12 ENCOUNTER — Other Ambulatory Visit: Payer: Self-pay

## 2022-11-12 NOTE — Progress Notes (Signed)
Steward Drone at Dr Merrilee Seashore office spoke to Dr Parke Simmers and will notify patient to stop coumadin 5 days prior to surgery

## 2022-11-18 ENCOUNTER — Ambulatory Visit (INDEPENDENT_AMBULATORY_CARE_PROVIDER_SITE_OTHER): Payer: 59 | Admitting: Podiatry

## 2022-11-18 ENCOUNTER — Encounter: Payer: Self-pay | Admitting: Podiatry

## 2022-11-18 ENCOUNTER — Encounter (HOSPITAL_BASED_OUTPATIENT_CLINIC_OR_DEPARTMENT_OTHER)
Admission: RE | Admit: 2022-11-18 | Discharge: 2022-11-18 | Disposition: A | Discharge status: Home / Self Care | Payer: 59 | Source: Ambulatory Visit | Attending: Orthopedic Surgery | Admitting: Orthopedic Surgery

## 2022-11-18 DIAGNOSIS — M65321 Trigger finger, right index finger: Secondary | ICD-10-CM | POA: Diagnosis not present

## 2022-11-18 DIAGNOSIS — Z01810 Encounter for preprocedural cardiovascular examination: Secondary | ICD-10-CM | POA: Diagnosis not present

## 2022-11-18 DIAGNOSIS — Z7984 Long term (current) use of oral hypoglycemic drugs: Secondary | ICD-10-CM | POA: Diagnosis not present

## 2022-11-18 DIAGNOSIS — M1990 Unspecified osteoarthritis, unspecified site: Secondary | ICD-10-CM | POA: Diagnosis not present

## 2022-11-18 DIAGNOSIS — G4730 Sleep apnea, unspecified: Secondary | ICD-10-CM | POA: Diagnosis not present

## 2022-11-18 DIAGNOSIS — K219 Gastro-esophageal reflux disease without esophagitis: Secondary | ICD-10-CM | POA: Diagnosis not present

## 2022-11-18 DIAGNOSIS — M79674 Pain in right toe(s): Secondary | ICD-10-CM | POA: Diagnosis not present

## 2022-11-18 DIAGNOSIS — M79675 Pain in left toe(s): Secondary | ICD-10-CM | POA: Diagnosis not present

## 2022-11-18 DIAGNOSIS — Z01812 Encounter for preprocedural laboratory examination: Secondary | ICD-10-CM | POA: Diagnosis not present

## 2022-11-18 DIAGNOSIS — Z794 Long term (current) use of insulin: Secondary | ICD-10-CM | POA: Diagnosis not present

## 2022-11-18 DIAGNOSIS — R9431 Abnormal electrocardiogram [ECG] [EKG]: Secondary | ICD-10-CM | POA: Diagnosis not present

## 2022-11-18 DIAGNOSIS — B351 Tinea unguium: Secondary | ICD-10-CM

## 2022-11-18 DIAGNOSIS — Z01818 Encounter for other preprocedural examination: Secondary | ICD-10-CM | POA: Diagnosis present

## 2022-11-18 DIAGNOSIS — I10 Essential (primary) hypertension: Secondary | ICD-10-CM | POA: Diagnosis not present

## 2022-11-18 DIAGNOSIS — Z79899 Other long term (current) drug therapy: Secondary | ICD-10-CM | POA: Diagnosis not present

## 2022-11-18 DIAGNOSIS — E119 Type 2 diabetes mellitus without complications: Secondary | ICD-10-CM | POA: Diagnosis not present

## 2022-11-18 LAB — BASIC METABOLIC PANEL
Anion gap: 7 (ref 5–15)
BUN: 17 mg/dL (ref 8–23)
CO2: 27 mmol/L (ref 22–32)
Calcium: 8.7 mg/dL — ABNORMAL LOW (ref 8.9–10.3)
Chloride: 104 mmol/L (ref 98–111)
Creatinine, Ser: 0.99 mg/dL (ref 0.44–1.00)
GFR, Estimated: 58 mL/min — ABNORMAL LOW (ref >60)
Glucose, Bld: 38 mg/dL — CL (ref 70–99)
Potassium: 4.8 mmol/L (ref 3.5–5.1)
Sodium: 138 mmol/L (ref 135–145)

## 2022-11-18 LAB — PROTIME-INR
INR: 1.1 (ref 0.8–1.2)
Prothrombin Time: 14 s (ref 11.4–15.2)

## 2022-11-18 NOTE — Progress Notes (Signed)
   Chief Complaint  Patient presents with   Nail Problem    Patient is here for routine foot care    SUBJECTIVE Patient presents to office today complaining of elongated, thickened nails that cause pain while ambulating in shoes.  Patient is unable to trim their own nails.   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 since 1996   Insulin dependent diabetes mellitus    Mitral valve prolapse    Neuropathy    Osteoarthritis    bilateral knee   Seasonal allergies    Sleep apnea    no CPAP use in several months, per pt.   Systemic lupus erythematosus (HCC)    Trigger finger, left middle finger 01/2017    OBJECTIVE General Patient is awake, alert, and oriented x 3 and in no acute distress. Derm Skin is dry and supple bilateral. Negative open lesions or macerations. Remaining integument unremarkable. Nails are tender, long, thickened and dystrophic with subungual debris, consistent with onychomycosis, 1-5 bilateral. No signs of infection noted. Vasc  DP and PT pedal pulses palpable bilaterally. Temperature gradient within normal limits.  Neuro Epicritic and protective threshold sensation grossly intact bilaterally.  Musculoskeletal Exam pes planus deformity noted to the bilateral feet left greater than the right.  There is collapse of the medial longitudinal arch and tenderness along the posterior tibial tendon of the left specifically  ASSESSMENT 1.  Pain due to onychomycosis of toenails both 2.  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  3. PTTD LLE 4.  Diabetes mellitus with peripheral  polyneuropathy  PLAN OF CARE 1. Patient evaluated today.  Comprehensive diabetic foot exam performed today 2. Instructed to maintain good pedal hygiene and foot care.  3. Mechanical debridement of nails 1-5 bilaterally performed using a nail nipper. Filed with dremel without incident.  4.  Continue wearing diabetic shoes and insoles 5.  Return to clinic in 3 months for routine foot care   Felecia Shelling, DPM [facility]  Dr. Felecia Shelling, DPM    [address] [place], [place] [address]                Office [phone]  Fax [phone]

## 2022-11-18 NOTE — Progress Notes (Signed)

## 2022-11-18 NOTE — Progress Notes (Signed)
Critical value Glucose-38. Informed patient. Patient states glucose is 190 at the time of phone call and she states she had just ate. She states she has no symptoms or discomfort. Instructed patient to discuss diabetic medication with PCP as soon as possible.

## 2022-11-18 NOTE — Anesthesia Preprocedure Evaluation (Signed)
Anesthesia Evaluation  Patient identified by MRN, date of birth, ID band Patient awake    Reviewed: Allergy & Precautions, NPO status , Patient's Chart, lab work & pertinent test results  Airway Mallampati: II  TM Distance: >3 FB Neck ROM: Full    Dental no notable dental hx. (+) Edentulous Lower, Edentulous Upper   Pulmonary sleep apnea and Continuous Positive Airway Pressure Ventilation    Pulmonary exam normal breath sounds clear to auscultation       Cardiovascular hypertension, Pt. on medications Normal cardiovascular exam Rhythm:Regular Rate:Normal     Neuro/Psych    GI/Hepatic ,GERD  Medicated and Controlled,,  Endo/Other  diabetes, Type 2, Oral Hypoglycemic Agents  ? Last GLP 1   Renal/GU      Musculoskeletal  (+) Arthritis ,    Abdominal   Peds  Hematology SLE hx of blood clots   Anesthesia Other Findings   Reproductive/Obstetrics                             Anesthesia Physical Anesthesia Plan  ASA: 3  Anesthesia Plan: Bier Block and Bier Block-Lidocaine Only   Post-op Pain Management: Tylenol PO (pre-op)*   Induction:   PONV Risk Score and Plan: Treatment may vary due to age or medical condition, Midazolam and Propofol infusion  Airway Management Planned: Natural Airway and Nasal Cannula  Additional Equipment: None  Intra-op Plan:   Post-operative Plan:   Informed Consent: I have reviewed the patients History and Physical, chart, labs and discussed the procedure including the risks, benefits and alternatives for the proposed anesthesia with the patient or authorized representative who has indicated his/her understanding and acceptance.     Dental advisory given  Plan Discussed with:   Anesthesia Plan Comments:         Anesthesia Quick Evaluation

## 2022-11-19 ENCOUNTER — Encounter (HOSPITAL_BASED_OUTPATIENT_CLINIC_OR_DEPARTMENT_OTHER): Payer: Self-pay | Admitting: Orthopedic Surgery

## 2022-11-19 ENCOUNTER — Other Ambulatory Visit: Payer: Self-pay

## 2022-11-19 ENCOUNTER — Ambulatory Visit (HOSPITAL_BASED_OUTPATIENT_CLINIC_OR_DEPARTMENT_OTHER): Payer: 59 | Admitting: Anesthesiology

## 2022-11-19 ENCOUNTER — Ambulatory Visit (HOSPITAL_BASED_OUTPATIENT_CLINIC_OR_DEPARTMENT_OTHER)
Admission: RE | Admit: 2022-11-19 | Discharge: 2022-11-19 | Disposition: A | Discharge status: Home / Self Care | Payer: 59 | Source: Ambulatory Visit | Attending: Orthopedic Surgery | Admitting: Orthopedic Surgery

## 2022-11-19 ENCOUNTER — Encounter (HOSPITAL_BASED_OUTPATIENT_CLINIC_OR_DEPARTMENT_OTHER)
Admission: RE | Disposition: A | Discharge status: Home / Self Care | Payer: Self-pay | Source: Ambulatory Visit | Attending: Orthopedic Surgery

## 2022-11-19 ENCOUNTER — Ambulatory Visit (HOSPITAL_BASED_OUTPATIENT_CLINIC_OR_DEPARTMENT_OTHER): Payer: Self-pay | Admitting: Anesthesiology

## 2022-11-19 DIAGNOSIS — M1990 Unspecified osteoarthritis, unspecified site: Secondary | ICD-10-CM | POA: Diagnosis not present

## 2022-11-19 DIAGNOSIS — Z79899 Other long term (current) drug therapy: Secondary | ICD-10-CM | POA: Insufficient documentation

## 2022-11-19 DIAGNOSIS — M65321 Trigger finger, right index finger: Secondary | ICD-10-CM | POA: Diagnosis not present

## 2022-11-19 DIAGNOSIS — E119 Type 2 diabetes mellitus without complications: Secondary | ICD-10-CM | POA: Insufficient documentation

## 2022-11-19 DIAGNOSIS — G4730 Sleep apnea, unspecified: Secondary | ICD-10-CM | POA: Insufficient documentation

## 2022-11-19 DIAGNOSIS — I10 Essential (primary) hypertension: Secondary | ICD-10-CM | POA: Insufficient documentation

## 2022-11-19 DIAGNOSIS — K219 Gastro-esophageal reflux disease without esophagitis: Secondary | ICD-10-CM | POA: Insufficient documentation

## 2022-11-19 DIAGNOSIS — Z01818 Encounter for other preprocedural examination: Secondary | ICD-10-CM

## 2022-11-19 DIAGNOSIS — Z794 Long term (current) use of insulin: Secondary | ICD-10-CM | POA: Diagnosis not present

## 2022-11-19 DIAGNOSIS — Z7984 Long term (current) use of oral hypoglycemic drugs: Secondary | ICD-10-CM | POA: Diagnosis not present

## 2022-11-19 HISTORY — PX: TRIGGER FINGER RELEASE: SHX641

## 2022-11-19 LAB — GLUCOSE, CAPILLARY
Glucose-Capillary: 175 mg/dL — ABNORMAL HIGH (ref 70–99)
Glucose-Capillary: 199 mg/dL — ABNORMAL HIGH (ref 70–99)

## 2022-11-19 SURGERY — RELEASE, A1 PULLEY, FOR TRIGGER FINGER
Anesthesia: Regional | Site: Index Finger | Laterality: Right

## 2022-11-19 MED ORDER — ACETAMINOPHEN 500 MG PO TABS
1000.0000 mg | ORAL_TABLET | Freq: Once | ORAL | Status: AC
  Administered 2022-11-19: 1000 mg via ORAL

## 2022-11-19 MED ORDER — FENTANYL CITRATE (PF) 100 MCG/2ML IJ SOLN: 25.0000 ug | INTRAMUSCULAR | Status: DC | PRN

## 2022-11-19 MED ORDER — ONDANSETRON HCL 4 MG/2ML IJ SOLN
INTRAMUSCULAR | Status: DC | PRN
  Administered 2022-11-19: 4 mg via INTRAVENOUS

## 2022-11-19 MED ORDER — FENTANYL CITRATE (PF) 100 MCG/2ML IJ SOLN
INTRAMUSCULAR | Status: AC
  Filled 2022-11-19: qty 2

## 2022-11-19 MED ORDER — FENTANYL CITRATE (PF) 100 MCG/2ML IJ SOLN
INTRAMUSCULAR | Status: DC | PRN
  Administered 2022-11-19: 50 ug via INTRAVENOUS
  Administered 2022-11-19 (×2): 25 ug via INTRAVENOUS

## 2022-11-19 MED ORDER — ACETAMINOPHEN-CODEINE 300-30 MG PO TABS
1.0000 | ORAL_TABLET | Freq: Four times a day (QID) | ORAL | 0 refills | Status: DC | PRN
Start: 2022-11-19 — End: 2023-09-29

## 2022-11-19 MED ORDER — BUPIVACAINE HCL (PF) 0.25 % IJ SOLN
INTRAMUSCULAR | Status: DC | PRN
  Administered 2022-11-19: 9 mL

## 2022-11-19 MED ORDER — ACETAMINOPHEN 10 MG/ML IV SOLN: 1000.0000 mg | Freq: Once | INTRAVENOUS | Status: DC | PRN

## 2022-11-19 MED ORDER — PROPOFOL 500 MG/50ML IV EMUL
INTRAVENOUS | Status: DC | PRN
  Administered 2022-11-19: 75 ug/kg/min via INTRAVENOUS

## 2022-11-19 MED ORDER — GLYCOPYRROLATE PF 0.2 MG/ML IJ SOSY
PREFILLED_SYRINGE | INTRAMUSCULAR | Status: AC
  Filled 2022-11-19: qty 1

## 2022-11-19 MED ORDER — CEFAZOLIN SODIUM-DEXTROSE 2-4 GM/100ML-% IV SOLN
INTRAVENOUS | Status: AC
  Filled 2022-11-19: qty 100

## 2022-11-19 MED ORDER — CEFAZOLIN SODIUM-DEXTROSE 2-4 GM/100ML-% IV SOLN
2.0000 g | INTRAVENOUS | Status: AC
  Administered 2022-11-19: 2 g via INTRAVENOUS

## 2022-11-19 MED ORDER — LIDOCAINE HCL (CARDIAC) PF 100 MG/5ML IV SOSY
PREFILLED_SYRINGE | INTRAVENOUS | Status: DC | PRN
  Administered 2022-11-19: 30 mL via INTRAVENOUS

## 2022-11-19 MED ORDER — LACTATED RINGERS IV SOLN: INTRAVENOUS | Status: DC

## 2022-11-19 MED ORDER — ONDANSETRON HCL 4 MG/2ML IJ SOLN: 4.0000 mg | Freq: Once | INTRAMUSCULAR | Status: DC | PRN

## 2022-11-19 MED ORDER — ACETAMINOPHEN 500 MG PO TABS
ORAL_TABLET | ORAL | Status: AC
  Filled 2022-11-19: qty 2

## 2022-11-19 SURGICAL SUPPLY — 31 items
APL PRP STRL LF DISP 70% ISPRP (MISCELLANEOUS) ×1
BLADE SURG 15 STRL LF DISP TIS (BLADE) ×2 IMPLANT
BLADE SURG 15 STRL SS (BLADE) ×2
BNDG CMPR 5X2 CHSV 1 LYR STRL (GAUZE/BANDAGES/DRESSINGS) ×1
BNDG CMPR 9X4 STRL LF SNTH (GAUZE/BANDAGES/DRESSINGS)
BNDG COHESIVE 2X5 TAN ST LF (GAUZE/BANDAGES/DRESSINGS) ×1 IMPLANT
BNDG ESMARK 4X9 LF (GAUZE/BANDAGES/DRESSINGS) IMPLANT
CHLORAPREP W/TINT 26 (MISCELLANEOUS) ×1 IMPLANT
CORD BIPOLAR FORCEPS 12FT (ELECTRODE) ×1 IMPLANT
COVER BACK TABLE 60X90IN (DRAPES) ×1 IMPLANT
COVER MAYO STAND STRL (DRAPES) ×1 IMPLANT
CUFF TOURN SGL QUICK 18X4 (TOURNIQUET CUFF) ×1 IMPLANT
DRAPE EXTREMITY T 121X128X90 (DISPOSABLE) ×1 IMPLANT
DRAPE SURG 17X23 STRL (DRAPES) ×1 IMPLANT
GAUZE SPONGE 4X4 12PLY STRL (GAUZE/BANDAGES/DRESSINGS) ×1 IMPLANT
GAUZE XEROFORM 1X8 LF (GAUZE/BANDAGES/DRESSINGS) ×1 IMPLANT
GLOVE BIO SURGEON STRL SZ7.5 (GLOVE) ×1 IMPLANT
GLOVE BIOGEL PI IND STRL 8 (GLOVE) ×1 IMPLANT
GOWN STRL REUS W/ TWL LRG LVL3 (GOWN DISPOSABLE) ×1 IMPLANT
GOWN STRL REUS W/TWL LRG LVL3 (GOWN DISPOSABLE) ×2
GOWN STRL REUS W/TWL XL LVL3 (GOWN DISPOSABLE) ×1 IMPLANT
NDL HYPO 25X1 1.5 SAFETY (NEEDLE) ×1 IMPLANT
NEEDLE HYPO 25X1 1.5 SAFETY (NEEDLE) ×1 IMPLANT
NS IRRIG 1000ML POUR BTL (IV SOLUTION) ×1 IMPLANT
PACK BASIN DAY SURGERY FS (CUSTOM PROCEDURE TRAY) ×1 IMPLANT
STOCKINETTE 4X48 STRL (DRAPES) ×1 IMPLANT
SUT ETHILON 4 0 PS 2 18 (SUTURE) ×1 IMPLANT
SYR BULB EAR ULCER 3OZ GRN STR (SYRINGE) ×1 IMPLANT
SYR CONTROL 10ML LL (SYRINGE) ×1 IMPLANT
TOWEL GREEN STERILE FF (TOWEL DISPOSABLE) ×2 IMPLANT
UNDERPAD 30X36 HEAVY ABSORB (UNDERPADS AND DIAPERS) ×1 IMPLANT

## 2022-11-19 NOTE — Op Note (Addendum)
11/19/2022 [facility]  Operative Note  PREOPERATIVE DIAGNOSIS: RIGHT INDEX FINGER TRIGGER  POSTOPERATIVE DIAGNOSIS:  RIGHT INDEX FINGER TRIGGER  PROCEDURE: Procedure(s): RELEASE TRIGGER FINGER/A-1 PULLEY RIGHT INDEX FINGER   SURGEON:  Betha Loa, MD  ASSISTANT:  none.  ANESTHESIA:  Bier block with sedation.  IV FLUIDS:  Per anesthesia flow sheet.  ESTIMATED BLOOD LOSS:  Minimal.  COMPLICATIONS:  None.  SPECIMENS:  None.  TOURNIQUET TIME:  Total Tourniquet Time Documented: Upper Arm (laterality) - 20 minutes Total: Upper Arm (laterality) - 20 minutes   DISPOSITION:  Stable to PACU.  LOCATION: [facility]  INDICATIONS: Kaitlyn Jackson is a 77 y.o. female with triggering right index finger.  This has been injected without lasting resolution.  She wishes to proceed with surgical trigger release.  Risks, benefits and alternatives of surgery were discussed including the risk of blood loss, infection, damage to nerves, vessels, tendons, ligaments, bone, failure of surgery, need for additional surgery, complications with wound healing, continued pain, continued triggering and need for repeat surgery.  She voiced understanding of these risks and elected to proceed.  OPERATIVE COURSE:  After being identified preoperatively by myself, the patient and I agreed upon the procedure and site of procedure.  The surgical site was marked. Surgical consent had been signed. She was given IV Ancef as preoperative antibiotic prophylaxis. She was transported to the operating room and placed on the operating room table in supine position with the Right upper extremity on an arm board. Bier block anesthesia was induced by the anesthesiologist.  The Right upper extremity was prepped and draped in normal sterile orthopedic fashion. A surgical pause was performed between surgeons, anesthesia, and operating room staff, and all were in agreement as to the patient, procedure,  and site of procedure.  Tourniquet at the proximal aspect of the forearm had been inflated for the Bier block.  An incision was made at the volar aspect of the MP joint of the index finger.  This was carried into the subcutaneous tissues by spreading technique.  Bipolar electrocautery was used to obtain hemostasis.  The radial and ulnar digital nerves were protected throughout the case. The flexor sheath was identified.  The A1 pulley was identified and sharply incised.  It was released in its entirety.  The proximal 1-2 mm of the A2 pulley was vented to allow better excursion of the tendons.  The finger was placed through a range of motion and there was noted to be no catching.  The tendons were brought through the wound and any adherences released.  The wound was then copiously irrigated with sterile saline. It was closed with 4-0 nylon in a horizontal mattress fashion.  It was injected with 0.25% plain Marcaine to aid in postoperative analgesia.  It was dressed with sterile Xeroform, 4x4s, and wrapped lightly with a Coban dressing.  Tourniquet was deflated at 20 minutes.  The fingertips were pink with brisk capillary refill after deflation of the tourniquet.  The operative drapes were broken down and the patient was awoken from anesthesia safely.  She was transferred back to the stretcher and taken to the PACU in stable condition.   I will see her back in the office in 1 week for postoperative followup.  I will give her a prescription for Tylenol #3 1 p.o. every 6 hours.  Pain dispense #15.    Betha Loa, MD Electronically signed, 11/19/22

## 2022-11-19 NOTE — Anesthesia Postprocedure Evaluation (Signed)
Anesthesia Post Note  [name]  Procedure(s) Performed: RELEASE TRIGGER FINGER/A-1 PULLEY RIGHT INDEX FINGER (Right: Index Finger)     Patient location during evaluation: PACU Anesthesia Type: Bier Block Level of consciousness: awake and alert Pain management: pain level controlled Vital Signs Assessment: post-procedure vital signs reviewed and stable Respiratory status: spontaneous breathing, nonlabored ventilation, respiratory function stable and patient connected to nasal cannula oxygen Cardiovascular status: stable and blood pressure returned to baseline Postop Assessment: no apparent nausea or vomiting Anesthetic complications: no   No notable events documented.  Last Vitals:  Vitals:   11/19/22 0958 11/19/22 1009  BP: (!) 122/59 126/64  Pulse: (!) 59 65  Resp: 18 16  Temp:  (!) 36.2 C  SpO2: 96% 95%    Last Pain:  Vitals:   11/19/22 1009  TempSrc:   PainSc: 0-No pain                 Trevor Iha

## 2022-11-19 NOTE — Anesthesia Procedure Notes (Signed)
Procedure Name: MAC Date/Time: 11/19/2022 9:03 AM  Performed by: Demetrio Lapping, CRNAPre-anesthesia Checklist: Patient identified, Emergency Drugs available, Suction available, Patient being monitored and Timeout performed Patient Re-evaluated:Patient Re-evaluated prior to induction Oxygen Delivery Method: Simple face mask Placement Confirmation: positive ETCO2 Dental Injury: Teeth and Oropharynx as per pre-operative assessment

## 2022-11-19 NOTE — Discharge Instructions (Addendum)
[facility] Instructions Hand Surgery  Wound Care: Keep your hand elevated above the level of your heart.  Do not allow it to dangle by your side.  Keep the dressing dry and do not remove it unless your doctor advises you to do so.  He will usually change it at the time of your post-op visit.  Moving your fingers is advised to stimulate circulation but will depend on the site of your surgery.  If you have a splint applied, your doctor will advise you regarding movement.  Activity: Do not drive or operate machinery today.  Rest today and then you may return to your normal activity and work as indicated by your physician.  Diet:  Drink liquids today or eat a light diet.  You may resume a regular diet tomorrow.    General expectations: Pain for two to three days. Fingers may become slightly swollen.  Call your doctor if any of the following occur: Severe pain not relieved by pain medication. Elevated temperature. Dressing soaked with blood. Inability to move fingers. White or bluish color to fingers.   No Tylenol until after 2:30pm today, if needed.   Post Anesthesia Home Care Instructions  Activity: Get plenty of rest for the remainder of the day. A responsible individual must stay with you for 24 hours following the procedure.  For the next 24 hours, DO NOT: -Drive a car -Advertising copywriter -Drink alcoholic beverages -Take any medication unless instructed by your physician -Make any legal decisions or sign important papers.  Meals: Start with liquid foods such as gelatin or soup. Progress to regular foods as tolerated. Avoid greasy, spicy, heavy foods. If nausea and/or vomiting occur, drink only clear liquids until the nausea and/or vomiting subsides. Call your physician if vomiting continues.  Special Instructions/Symptoms: Your throat may feel dry or sore from the anesthesia or the breathing tube placed in your throat during surgery. If this causes discomfort, gargle with warm  salt water. The discomfort should disappear within 24 hours.  If you had a scopolamine patch placed behind your ear for the management of post- operative nausea and/or vomiting:  1. The medication in the patch is effective for 72 hours, after which it should be removed.  Wrap patch in a tissue and discard in the trash. Wash hands thoroughly with soap and water. 2. You may remove the patch earlier than 72 hours if you experience unpleasant side effects which may include dry mouth, dizziness or visual disturbances. 3. Avoid touching the patch. Wash your hands with soap and water after contact with the patch.

## 2022-11-19 NOTE — H&P (Signed)
Kaitlyn Jackson is an 77 y.o. female.   Chief Complaint: trigger digit HPI: 77 yo female with right index finger trigger digit.  This has been injected previously without lasting resolution.  She wishes to proceed with operative trigger release.  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 since 1996   Insulin dependent diabetes mellitus    Mitral valve prolapse    Neuropathy    Osteoarthritis    bilateral knee   Seasonal allergies    Sleep apnea    no CPAP use in several months, per pt.   Systemic lupus erythematosus (HCC)    Trigger finger, left middle finger 01/2017    Past Surgical History:  Procedure Laterality Date   BACK SURGERY     lower - removed  cysts   CATARACT EXTRACTION W/ INTRAOCULAR LENS  IMPLANT, BILATERAL Bilateral    GLAUCOMA SURGERY Bilateral    laser   HEMILAMINOTOMY LUMBAR SPINE Right 01/30/2009   L5   JOINT REPLACEMENT     TOTAL KNEE ARTHROPLASTY Right 09/22/2007   TOTAL KNEE ARTHROPLASTY Left 02/01/2007   TOTAL KNEE REVISION Right 01/31/2019   Procedure: RIGHT TOTAL KNEE REVISION;  Surgeon: Valeria Batman, MD;  Location: [facility];  Service: Orthopedics;  Laterality: Right;   TRIGGER FINGER RELEASE Right 12/14/2013   Procedure: RELEASE TRIGGER FINGER/A-1 PULLEY RIGHT RING FINGER;  Surgeon: Cindee Salt, MD;  Location: [facility];  Service: Orthopedics;  Laterality: Right;   TRIGGER FINGER RELEASE Left 02/11/2017   Procedure: RELEASE TRIGGER FINGER/A-1 PULLEY;  Surgeon: Betha Loa, MD;  Location: [facility];  Service: Orthopedics;  Laterality: Left;    Family History: Family History  Problem Relation Age of Onset   Cancer Mother        colon   Cancer Cousin        lung   Diabetes Sister    Diabetes Sister    Diabetes Sister     Social History:   reports that she has never smoked. She has never used smokeless tobacco. She reports that she does not drink alcohol and does not use drugs.  Medications: Medications Prior to Admission  Medication Sig Dispense Refill   acetaminophen (TYLENOL) 325 MG tablet Take 2 tablets (650 mg total) by mouth every 6 (six) hours as needed for moderate pain.     atorvastatin (LIPITOR) 20 MG tablet Take 10 mg by mouth at bedtime.     benazepril (LOTENSIN) 40 MG tablet Take 40 mg by mouth daily.      cholecalciferol (VITAMIN D) 1000 UNITS tablet Take 1,000 Units by mouth at bedtime.      esomeprazole (NEXIUM) 40 MG capsule Take 40 mg by  mouth daily before breakfast.     FARXIGA 10 MG TABS tablet Take 10 mg by mouth daily.     furosemide (LASIX) 40 MG tablet Take 40 mg by mouth daily.     HUMALOG MIX 75/25 KWIKPEN (75-25) 100 UNIT/ML Kwikpen Inject 44 Units into the skin daily.     insulin aspart (NOVOLOG) 100 UNIT/ML injection Inject into the skin.     metFORMIN (GLUCOPHAGE) 1000 MG tablet Take 1,000 mg by mouth at bedtime.  3   montelukast (SINGULAIR) 10 MG tablet Take 10 mg by mouth at bedtime.     pregabalin (LYRICA) 100 MG capsule Take 100 mg by mouth 2 (two) times daily.     Semaglutide (RYBELSUS) 14 MG TABS Take by mouth.     sitaGLIPtin-metformin (JANUMET) 50-1000 MG tablet Take 1 tablet by mouth every morning.      verapamil (CALAN-SR) 180 MG CR tablet Take 180 mg by mouth 2 (two) times daily.     vitamin C (ASCORBIC ACID) 500 MG tablet  Take 500 mg by mouth daily.      warfarin (COUMADIN) 10 MG tablet Take 10 mg by mouth See admin instructions. Take 1 tablet by mouth every other day in the evening, alternating with 7.5mg  tablet  3   warfarin (COUMADIN) 7.5 MG tablet Take 7.5 mg by mouth See admin instructions. Take 1 tablet by mouth every other day in the evening, alternating with 10mg  tablet  3   ACCU-CHEK AVIVA PLUS test strip CHECK BLOOD GLUCOSE TWICE A DAY  4   azelastine (ASTELIN) 0.1 % nasal spray azelastine 137 mcg (0.1 %) nasal spray aerosol  PLACE 2 SPRAYS INTO BOTH NOSTRILS 2 (TWO) TIMES DAILY.     BESIVANCE 0.6 % SUSP Place 1 drop into the right eye See admin instructions. Instill one drop into right eye 4 times daily for 2 days after each monthly eye injection.     Blood Glucose Monitoring Suppl (GLUCOCOM BLOOD GLUCOSE MONITOR) DEVI Accu-Chek Aviva Plus Meter     Calcitriol-Fluticas-Tarcrolim (TRIDERMA FORTE EX) Apply 1 application topically 3 (three) times daily as needed (for pain).     Cholecalciferol 25 MCG (1000 UT) capsule Vitamin D3 25 mcg (1,000 unit) capsule  Take by oral route.     fluticasone (FLONASE) 50 MCG/ACT nasal spray Place 1 spray into both nostrils daily as needed for allergies.      hydrocortisone 2.5 % lotion Apply topically.     hydrOXYzine (ATARAX/VISTARIL) 25 MG tablet Take 25 mg by mouth daily.     Insulin Pen Needle (NOVOFINE) 30G X 8 MM MISC NovoFine 30 30 gauge x 1/3" needle     Insulin Pen Needle 31G X 8 MM MISC BD Ultra-Fine Short Pen Needle 31 gauge x 5/16"     Insulin Pen Needle 32G X 4 MM MISC NovoFine Plus 32 gauge x 1/6" needle  USE 2 TIMES DAILY     Lancet Devices ([employer] LANCING DEVICE) MISC Accu-Chek FastClix Lancing Device     Lancets Misc. (ACCU-CHEK FASTCLIX LANCET) KIT Accu-Chek FastClix Lancing Device      Results for orders placed or performed during the hospital encounter of 11/19/22 (from the past 48 hour(s))  Basic metabolic panel per protocol     Status: Abnormal    Collection Time: 11/18/22 10:17 AM  Result Value Ref Range   Sodium 138 135 - 145 mmol/L   Potassium 4.8 3.5 - 5.1 mmol/L   Chloride 104 98 - 111 mmol/L   CO2  27 22 - 32 mmol/L   Glucose, Bld 38 (LL) 70 - 99 mg/dL    Comment: CRITICAL RESULT CALLED TO, READ BACK BY AND VERIFIED WITH Benedict Needy RN [phone] 1442 M.ALAMANO Glucose reference range applies only to samples taken after fasting for at least 8 hours.    BUN 17 8 - 23 mg/dL   Creatinine, Ser 9.14 0.44 - 1.00 mg/dL   Calcium 8.7 (L) 8.9 - 10.3 mg/dL   GFR, Estimated 58 (L) >60 mL/min    Comment: (NOTE) Calculated using the CKD-EPI Creatinine Equation (2021)    Anion gap 7 5 - 15    Comment: Performed at [facility], [address], [place], [place] [address]  PT-INR at PAT visit (Pre-admission Testing) per protocol     Status: None   Collection Time: 11/18/22 10:17 AM  Result Value Ref Range   Prothrombin Time 14.0 11.4 - 15.2 seconds   INR 1.1 0.8 - 1.2    Comment: (NOTE) INR goal varies based on device and disease states. Performed at [facility], [address], [place], [place] [address]     No results found.    Height 5\' 3"  (1.6 m), weight 94.8 kg.  General appearance: alert, cooperative, and appears stated age Head: Normocephalic, without obvious abnormality, atraumatic Neck: supple, symmetrical, trachea midline Extremities: Intact sensation and capillary refill all digits.  +epl/fpl/io.  No wounds.  Pulses: 2+ and symmetric Skin: Skin color, texture, turgor normal. No rashes or lesions Neurologic: Grossly normal Incision/Wound: none  Assessment/Plan Right index finger trigger digit.  Non operative and operative treatment options have been discussed with the patient and patient wishes to proceed with operative treatment. Risks, benefits, and alternatives of surgery have been discussed and the patient agrees with the plan of care.   Betha Loa 11/19/2022, 8:22 AM

## 2022-11-19 NOTE — Transfer of Care (Signed)
Immediate Anesthesia Transfer of Care Note  [name]  Procedure(s) Performed: RELEASE TRIGGER FINGER/A-1 PULLEY RIGHT INDEX FINGER (Right: Index Finger)  Patient Location: PACU  Anesthesia Type:MAC and Bier block  Level of Consciousness: awake and patient cooperative  Airway & Oxygen Therapy: Patient Spontanous Breathing and Patient connected to face mask oxygen  Post-op Assessment: Report given to RN and Post -op Vital signs reviewed and stable  Post vital signs: Reviewed and stable  Last Vitals:  Vitals Value Taken Time  BP 108/57 11/19/22 0927  Temp    Pulse 65 11/19/22 0928  Resp 14 11/19/22 0928  SpO2 96 % 11/19/22 0928  Vitals shown include unfiled device data.  Last Pain:  Vitals:   11/19/22 0832  TempSrc: Oral  PainSc: 0-No pain         Complications: No notable events documented.

## 2022-11-20 ENCOUNTER — Encounter (HOSPITAL_BASED_OUTPATIENT_CLINIC_OR_DEPARTMENT_OTHER): Payer: Self-pay | Admitting: Orthopedic Surgery

## 2022-11-23 ENCOUNTER — Encounter (INDEPENDENT_AMBULATORY_CARE_PROVIDER_SITE_OTHER): Payer: 59 | Admitting: Ophthalmology

## 2022-11-23 DIAGNOSIS — E1169 Type 2 diabetes mellitus with other specified complication: Secondary | ICD-10-CM | POA: Diagnosis not present

## 2022-11-23 DIAGNOSIS — H33302 Unspecified retinal break, left eye: Secondary | ICD-10-CM

## 2022-11-23 DIAGNOSIS — H43813 Vitreous degeneration, bilateral: Secondary | ICD-10-CM | POA: Diagnosis not present

## 2022-11-23 DIAGNOSIS — E113311 Type 2 diabetes mellitus with moderate nonproliferative diabetic retinopathy with macular edema, right eye: Secondary | ICD-10-CM | POA: Diagnosis not present

## 2022-11-23 DIAGNOSIS — I10 Essential (primary) hypertension: Secondary | ICD-10-CM | POA: Diagnosis not present

## 2022-11-23 DIAGNOSIS — H35033 Hypertensive retinopathy, bilateral: Secondary | ICD-10-CM | POA: Diagnosis not present

## 2022-11-23 DIAGNOSIS — Z794 Long term (current) use of insulin: Secondary | ICD-10-CM | POA: Diagnosis not present

## 2022-11-23 DIAGNOSIS — E113392 Type 2 diabetes mellitus with moderate nonproliferative diabetic retinopathy without macular edema, left eye: Secondary | ICD-10-CM

## 2022-11-24 DIAGNOSIS — E1169 Type 2 diabetes mellitus with other specified complication: Secondary | ICD-10-CM | POA: Diagnosis not present

## 2022-12-04 DIAGNOSIS — M65321 Trigger finger, right index finger: Secondary | ICD-10-CM | POA: Diagnosis not present

## 2022-12-07 DIAGNOSIS — E1169 Type 2 diabetes mellitus with other specified complication: Secondary | ICD-10-CM | POA: Diagnosis not present

## 2022-12-08 DIAGNOSIS — E1169 Type 2 diabetes mellitus with other specified complication: Secondary | ICD-10-CM | POA: Diagnosis not present

## 2022-12-08 DIAGNOSIS — I10 Essential (primary) hypertension: Secondary | ICD-10-CM | POA: Diagnosis not present

## 2022-12-08 DIAGNOSIS — I82509 Chronic embolism and thrombosis of unspecified deep veins of unspecified lower extremity: Secondary | ICD-10-CM | POA: Diagnosis not present

## 2022-12-21 ENCOUNTER — Encounter (INDEPENDENT_AMBULATORY_CARE_PROVIDER_SITE_OTHER): Payer: 59 | Admitting: Ophthalmology

## 2022-12-21 DIAGNOSIS — I10 Essential (primary) hypertension: Secondary | ICD-10-CM

## 2022-12-21 DIAGNOSIS — H43813 Vitreous degeneration, bilateral: Secondary | ICD-10-CM | POA: Diagnosis not present

## 2022-12-21 DIAGNOSIS — H35033 Hypertensive retinopathy, bilateral: Secondary | ICD-10-CM | POA: Diagnosis not present

## 2022-12-21 DIAGNOSIS — E1169 Type 2 diabetes mellitus with other specified complication: Secondary | ICD-10-CM | POA: Diagnosis not present

## 2022-12-21 DIAGNOSIS — Z794 Long term (current) use of insulin: Secondary | ICD-10-CM

## 2022-12-21 DIAGNOSIS — E113392 Type 2 diabetes mellitus with moderate nonproliferative diabetic retinopathy without macular edema, left eye: Secondary | ICD-10-CM | POA: Diagnosis not present

## 2022-12-21 DIAGNOSIS — E113311 Type 2 diabetes mellitus with moderate nonproliferative diabetic retinopathy with macular edema, right eye: Secondary | ICD-10-CM | POA: Diagnosis not present

## 2022-12-29 DIAGNOSIS — E039 Hypothyroidism, unspecified: Secondary | ICD-10-CM | POA: Diagnosis not present

## 2022-12-29 DIAGNOSIS — E1169 Type 2 diabetes mellitus with other specified complication: Secondary | ICD-10-CM | POA: Diagnosis not present

## 2023-01-04 DIAGNOSIS — E1169 Type 2 diabetes mellitus with other specified complication: Secondary | ICD-10-CM | POA: Diagnosis not present

## 2023-01-18 ENCOUNTER — Encounter (INDEPENDENT_AMBULATORY_CARE_PROVIDER_SITE_OTHER): Payer: 59 | Admitting: Ophthalmology

## 2023-01-18 DIAGNOSIS — E1169 Type 2 diabetes mellitus with other specified complication: Secondary | ICD-10-CM | POA: Diagnosis not present

## 2023-01-19 DIAGNOSIS — I10 Essential (primary) hypertension: Secondary | ICD-10-CM | POA: Diagnosis not present

## 2023-01-19 DIAGNOSIS — E781 Pure hyperglyceridemia: Secondary | ICD-10-CM | POA: Diagnosis not present

## 2023-01-19 DIAGNOSIS — E1169 Type 2 diabetes mellitus with other specified complication: Secondary | ICD-10-CM | POA: Diagnosis not present

## 2023-01-29 ENCOUNTER — Encounter (INDEPENDENT_AMBULATORY_CARE_PROVIDER_SITE_OTHER): Payer: 59 | Admitting: Ophthalmology

## 2023-01-29 DIAGNOSIS — E113292 Type 2 diabetes mellitus with mild nonproliferative diabetic retinopathy without macular edema, left eye: Secondary | ICD-10-CM | POA: Diagnosis not present

## 2023-01-29 DIAGNOSIS — E113311 Type 2 diabetes mellitus with moderate nonproliferative diabetic retinopathy with macular edema, right eye: Secondary | ICD-10-CM

## 2023-01-29 DIAGNOSIS — Z794 Long term (current) use of insulin: Secondary | ICD-10-CM | POA: Diagnosis not present

## 2023-01-29 DIAGNOSIS — H33302 Unspecified retinal break, left eye: Secondary | ICD-10-CM

## 2023-01-29 DIAGNOSIS — I10 Essential (primary) hypertension: Secondary | ICD-10-CM

## 2023-01-29 DIAGNOSIS — H35033 Hypertensive retinopathy, bilateral: Secondary | ICD-10-CM

## 2023-01-29 DIAGNOSIS — H43813 Vitreous degeneration, bilateral: Secondary | ICD-10-CM

## 2023-02-01 DIAGNOSIS — E1169 Type 2 diabetes mellitus with other specified complication: Secondary | ICD-10-CM | POA: Diagnosis not present

## 2023-02-08 DIAGNOSIS — Z1231 Encounter for screening mammogram for malignant neoplasm of breast: Secondary | ICD-10-CM | POA: Diagnosis not present

## 2023-02-09 DIAGNOSIS — E1169 Type 2 diabetes mellitus with other specified complication: Secondary | ICD-10-CM | POA: Diagnosis not present

## 2023-02-09 DIAGNOSIS — I10 Essential (primary) hypertension: Secondary | ICD-10-CM | POA: Diagnosis not present

## 2023-02-15 DIAGNOSIS — E1169 Type 2 diabetes mellitus with other specified complication: Secondary | ICD-10-CM | POA: Diagnosis not present

## 2023-02-17 ENCOUNTER — Ambulatory Visit: Payer: 59 | Admitting: Podiatry

## 2023-02-23 DIAGNOSIS — E7800 Pure hypercholesterolemia, unspecified: Secondary | ICD-10-CM | POA: Diagnosis not present

## 2023-02-23 DIAGNOSIS — E1169 Type 2 diabetes mellitus with other specified complication: Secondary | ICD-10-CM | POA: Diagnosis not present

## 2023-02-23 DIAGNOSIS — I10 Essential (primary) hypertension: Secondary | ICD-10-CM | POA: Diagnosis not present

## 2023-02-24 ENCOUNTER — Ambulatory Visit: Payer: 59 | Admitting: Podiatry

## 2023-02-25 ENCOUNTER — Ambulatory Visit: Payer: 59 | Admitting: Podiatry

## 2023-02-25 ENCOUNTER — Encounter: Payer: Self-pay | Admitting: Podiatry

## 2023-02-25 DIAGNOSIS — E0843 Diabetes mellitus due to underlying condition with diabetic autonomic (poly)neuropathy: Secondary | ICD-10-CM

## 2023-02-25 DIAGNOSIS — M79675 Pain in left toe(s): Secondary | ICD-10-CM

## 2023-02-25 DIAGNOSIS — M79674 Pain in right toe(s): Secondary | ICD-10-CM

## 2023-02-25 DIAGNOSIS — B351 Tinea unguium: Secondary | ICD-10-CM | POA: Diagnosis not present

## 2023-02-26 ENCOUNTER — Encounter (INDEPENDENT_AMBULATORY_CARE_PROVIDER_SITE_OTHER): Payer: 59 | Admitting: Ophthalmology

## 2023-02-26 DIAGNOSIS — E113392 Type 2 diabetes mellitus with moderate nonproliferative diabetic retinopathy without macular edema, left eye: Secondary | ICD-10-CM | POA: Diagnosis not present

## 2023-02-26 DIAGNOSIS — H43813 Vitreous degeneration, bilateral: Secondary | ICD-10-CM

## 2023-02-26 DIAGNOSIS — H33302 Unspecified retinal break, left eye: Secondary | ICD-10-CM

## 2023-02-26 DIAGNOSIS — H35033 Hypertensive retinopathy, bilateral: Secondary | ICD-10-CM | POA: Diagnosis not present

## 2023-02-26 DIAGNOSIS — I10 Essential (primary) hypertension: Secondary | ICD-10-CM | POA: Diagnosis not present

## 2023-02-26 DIAGNOSIS — Z794 Long term (current) use of insulin: Secondary | ICD-10-CM | POA: Diagnosis not present

## 2023-02-26 DIAGNOSIS — E113311 Type 2 diabetes mellitus with moderate nonproliferative diabetic retinopathy with macular edema, right eye: Secondary | ICD-10-CM | POA: Diagnosis not present

## 2023-02-28 NOTE — Progress Notes (Signed)
  Subjective:  Patient ID: Kaitlyn Jackson, female    DOB: 18-Jun-1945,  MRN: [id number]  Chief Complaint  Patient presents with   Foot Pain    DM. Routine foot care - Nail trim both feet.    77 y.o. female presents with the above complaint. History confirmed with patient. Patient presenting with pain related to dystrophic thickened elongated nails. Patient is unable to trim own nails related to nail dystrophy and/or mobility issues. Patient does have a history of T2DM.  She states her A1c was drawn yesterday, she does not know the results.  She believes her last A1c was approximately 10 however she believes that it will be improved at this time.  Objective:  Physical Exam: warm, good capillary refill nail exam onychomycosis of the toenails, dystrophic nails, color changes noted and subungual debris present to nail plates 1 through 5 bilaterally with tenderness on direct dorsal palpation. DP pulses palpable, PT pulses palpable, and protective sensation intact Left Foot:  Pain with palpation of nails due to elongation and dystrophic growth.  Right Foot: Pain with palpation of nails due to elongation and dystrophic growth.  She does have associated pes planus deformity bilaterally.  Assessment:   1. Diabetes mellitus due to underlying condition with diabetic autonomic neuropathy, unspecified whether long term insulin use (HCC)   2. Pain due to onychomycosis of toenails of both feet      Plan:  Patient was evaluated and treated and all questions answered.  #Onychomycosis with pain  -Nails palliatively debrided as below. -Educated on self-care  Procedure: Nail Debridement Rationale: Pain Type of Debridement: manual, sharp debridement. Instrumentation: Nail nipper, rotary burr. Number of Nails: 10  Patient educated on diabetes. Discussed proper diabetic foot care and discussed risks and complications of disease. Educated patient in depth on reasons to return to the office  immediately should he/she discover anything concerning or new on the feet. All questions answered. Discussed proper shoes as well.    Patient may resume to follow-up with Dr. Logan Bores  Return in about 3 months (around 05/26/2023) for Diabetic Foot Care.         Bronwen Betters, DPM [facility] / [facility]

## 2023-03-01 DIAGNOSIS — E1169 Type 2 diabetes mellitus with other specified complication: Secondary | ICD-10-CM | POA: Diagnosis not present

## 2023-03-15 DIAGNOSIS — E1169 Type 2 diabetes mellitus with other specified complication: Secondary | ICD-10-CM | POA: Diagnosis not present

## 2023-03-31 ENCOUNTER — Encounter (INDEPENDENT_AMBULATORY_CARE_PROVIDER_SITE_OTHER): Payer: 59 | Admitting: Ophthalmology

## 2023-03-31 DIAGNOSIS — I10 Essential (primary) hypertension: Secondary | ICD-10-CM

## 2023-03-31 DIAGNOSIS — Z794 Long term (current) use of insulin: Secondary | ICD-10-CM

## 2023-03-31 DIAGNOSIS — H33302 Unspecified retinal break, left eye: Secondary | ICD-10-CM | POA: Diagnosis not present

## 2023-03-31 DIAGNOSIS — E113311 Type 2 diabetes mellitus with moderate nonproliferative diabetic retinopathy with macular edema, right eye: Secondary | ICD-10-CM | POA: Diagnosis not present

## 2023-03-31 DIAGNOSIS — H43813 Vitreous degeneration, bilateral: Secondary | ICD-10-CM

## 2023-03-31 DIAGNOSIS — E113392 Type 2 diabetes mellitus with moderate nonproliferative diabetic retinopathy without macular edema, left eye: Secondary | ICD-10-CM

## 2023-03-31 DIAGNOSIS — H35033 Hypertensive retinopathy, bilateral: Secondary | ICD-10-CM

## 2023-04-02 DIAGNOSIS — E1169 Type 2 diabetes mellitus with other specified complication: Secondary | ICD-10-CM | POA: Diagnosis not present

## 2023-04-02 DIAGNOSIS — I10 Essential (primary) hypertension: Secondary | ICD-10-CM | POA: Diagnosis not present

## 2023-04-16 DIAGNOSIS — E1169 Type 2 diabetes mellitus with other specified complication: Secondary | ICD-10-CM | POA: Diagnosis not present

## 2023-04-28 ENCOUNTER — Encounter (INDEPENDENT_AMBULATORY_CARE_PROVIDER_SITE_OTHER): Payer: 59 | Admitting: Ophthalmology

## 2023-04-28 DIAGNOSIS — E113311 Type 2 diabetes mellitus with moderate nonproliferative diabetic retinopathy with macular edema, right eye: Secondary | ICD-10-CM | POA: Diagnosis not present

## 2023-04-28 DIAGNOSIS — E113392 Type 2 diabetes mellitus with moderate nonproliferative diabetic retinopathy without macular edema, left eye: Secondary | ICD-10-CM

## 2023-04-28 DIAGNOSIS — I10 Essential (primary) hypertension: Secondary | ICD-10-CM

## 2023-04-28 DIAGNOSIS — H33302 Unspecified retinal break, left eye: Secondary | ICD-10-CM

## 2023-04-28 DIAGNOSIS — H43813 Vitreous degeneration, bilateral: Secondary | ICD-10-CM

## 2023-04-28 DIAGNOSIS — H35033 Hypertensive retinopathy, bilateral: Secondary | ICD-10-CM

## 2023-04-30 DIAGNOSIS — E1169 Type 2 diabetes mellitus with other specified complication: Secondary | ICD-10-CM | POA: Diagnosis not present

## 2023-05-15 DIAGNOSIS — E1169 Type 2 diabetes mellitus with other specified complication: Secondary | ICD-10-CM | POA: Diagnosis not present

## 2023-05-26 ENCOUNTER — Ambulatory Visit (INDEPENDENT_AMBULATORY_CARE_PROVIDER_SITE_OTHER): Payer: 59 | Admitting: Podiatry

## 2023-05-26 ENCOUNTER — Encounter: Payer: Self-pay | Admitting: Podiatry

## 2023-05-26 DIAGNOSIS — M2142 Flat foot [pes planus] (acquired), left foot: Secondary | ICD-10-CM | POA: Diagnosis not present

## 2023-05-26 DIAGNOSIS — M2141 Flat foot [pes planus] (acquired), right foot: Secondary | ICD-10-CM | POA: Diagnosis not present

## 2023-05-26 DIAGNOSIS — M79674 Pain in right toe(s): Secondary | ICD-10-CM | POA: Diagnosis not present

## 2023-05-26 DIAGNOSIS — B351 Tinea unguium: Secondary | ICD-10-CM | POA: Diagnosis not present

## 2023-05-26 DIAGNOSIS — M79675 Pain in left toe(s): Secondary | ICD-10-CM

## 2023-05-26 NOTE — Progress Notes (Signed)
   Chief Complaint  Patient presents with   Diabetes    Patient is here for [facility]     SUBJECTIVE Patient presents to office today complaining of elongated, thickened nails that cause pain while ambulating in shoes.  Patient is unable to trim their own nails.   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 since 1996   Insulin dependent diabetes mellitus    Mitral valve prolapse    Neuropathy    Osteoarthritis    bilateral knee   Seasonal allergies    Sleep apnea    no CPAP use in several months, per pt.   Systemic lupus erythematosus (HCC)    Trigger finger, left middle finger 01/2017    OBJECTIVE General Patient is awake, alert, and oriented x 3 and in no acute distress. Derm Skin is dry and supple bilateral. Negative open lesions or macerations. Remaining integument unremarkable. Nails are tender, long, thickened and dystrophic with subungual debris, consistent with onychomycosis, 1-5 bilateral. No signs of infection noted. Vasc  DP and PT pedal pulses palpable bilaterally. Temperature gradient within normal limits.  Neuro Epicritic and protective threshold sensation grossly intact bilaterally.  Musculoskeletal Exam pes planus deformity noted to the bilateral feet left greater than the right.  There is collapse of the medial longitudinal arch and tenderness along the posterior tibial tendon of the left specifically  ASSESSMENT 1.  Pain due to onychomycosis of toenails both 2.  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  3. PTTD LLE 4.  Diabetes mellitus with peripheral polyneuropathy  PLAN OF  CARE 1. Patient evaluated today.   2. Instructed to maintain good pedal hygiene and foot care.  3. Mechanical debridement of nails 1-5 bilaterally performed using a nail nipper. Filed with dremel without incident.  4.  Continue conservative management of the pes planovalgus deformity bilateral.  Continue wearing diabetic shoes and insoles to prevent collapse of the feet bilateral during weightbearing 5.  Return to clinic in 3 months for routine foot care   Felecia Shelling, DPM [facility]  Dr. Felecia Shelling, DPM    [address] [place], [place] [address]                Office [phone]  Fax [phone]

## 2023-05-27 ENCOUNTER — Encounter (INDEPENDENT_AMBULATORY_CARE_PROVIDER_SITE_OTHER): Payer: 59 | Admitting: Ophthalmology

## 2023-05-29 DIAGNOSIS — E1169 Type 2 diabetes mellitus with other specified complication: Secondary | ICD-10-CM | POA: Diagnosis not present

## 2023-06-04 DIAGNOSIS — M130 Polyarthritis, unspecified: Secondary | ICD-10-CM | POA: Diagnosis not present

## 2023-06-04 DIAGNOSIS — E1169 Type 2 diabetes mellitus with other specified complication: Secondary | ICD-10-CM | POA: Diagnosis not present

## 2023-06-04 DIAGNOSIS — I82509 Chronic embolism and thrombosis of unspecified deep veins of unspecified lower extremity: Secondary | ICD-10-CM | POA: Diagnosis not present

## 2023-06-04 DIAGNOSIS — I10 Essential (primary) hypertension: Secondary | ICD-10-CM | POA: Diagnosis not present

## 2023-06-10 ENCOUNTER — Encounter (INDEPENDENT_AMBULATORY_CARE_PROVIDER_SITE_OTHER): Admitting: Ophthalmology

## 2023-06-10 DIAGNOSIS — I10 Essential (primary) hypertension: Secondary | ICD-10-CM | POA: Diagnosis not present

## 2023-06-10 DIAGNOSIS — E113392 Type 2 diabetes mellitus with moderate nonproliferative diabetic retinopathy without macular edema, left eye: Secondary | ICD-10-CM | POA: Diagnosis not present

## 2023-06-10 DIAGNOSIS — H35371 Puckering of macula, right eye: Secondary | ICD-10-CM | POA: Diagnosis not present

## 2023-06-10 DIAGNOSIS — E113311 Type 2 diabetes mellitus with moderate nonproliferative diabetic retinopathy with macular edema, right eye: Secondary | ICD-10-CM | POA: Diagnosis not present

## 2023-06-10 DIAGNOSIS — H43813 Vitreous degeneration, bilateral: Secondary | ICD-10-CM

## 2023-06-10 DIAGNOSIS — Z794 Long term (current) use of insulin: Secondary | ICD-10-CM

## 2023-06-10 DIAGNOSIS — H33302 Unspecified retinal break, left eye: Secondary | ICD-10-CM

## 2023-06-10 DIAGNOSIS — H35033 Hypertensive retinopathy, bilateral: Secondary | ICD-10-CM | POA: Diagnosis not present

## 2023-06-14 DIAGNOSIS — E1169 Type 2 diabetes mellitus with other specified complication: Secondary | ICD-10-CM | POA: Diagnosis not present

## 2023-06-28 DIAGNOSIS — E1169 Type 2 diabetes mellitus with other specified complication: Secondary | ICD-10-CM | POA: Diagnosis not present

## 2023-07-12 DIAGNOSIS — E1169 Type 2 diabetes mellitus with other specified complication: Secondary | ICD-10-CM | POA: Diagnosis not present

## 2023-07-22 ENCOUNTER — Encounter (INDEPENDENT_AMBULATORY_CARE_PROVIDER_SITE_OTHER): Admitting: Ophthalmology

## 2023-07-22 DIAGNOSIS — H43813 Vitreous degeneration, bilateral: Secondary | ICD-10-CM | POA: Diagnosis not present

## 2023-07-22 DIAGNOSIS — Z794 Long term (current) use of insulin: Secondary | ICD-10-CM

## 2023-07-22 DIAGNOSIS — I10 Essential (primary) hypertension: Secondary | ICD-10-CM | POA: Diagnosis not present

## 2023-07-22 DIAGNOSIS — H35033 Hypertensive retinopathy, bilateral: Secondary | ICD-10-CM

## 2023-07-22 DIAGNOSIS — E113311 Type 2 diabetes mellitus with moderate nonproliferative diabetic retinopathy with macular edema, right eye: Secondary | ICD-10-CM | POA: Diagnosis not present

## 2023-07-22 DIAGNOSIS — H33302 Unspecified retinal break, left eye: Secondary | ICD-10-CM

## 2023-07-22 DIAGNOSIS — E113292 Type 2 diabetes mellitus with mild nonproliferative diabetic retinopathy without macular edema, left eye: Secondary | ICD-10-CM

## 2023-07-27 DIAGNOSIS — E1169 Type 2 diabetes mellitus with other specified complication: Secondary | ICD-10-CM | POA: Diagnosis not present

## 2023-08-05 DIAGNOSIS — E0840 Diabetes mellitus due to underlying condition with diabetic neuropathy, unspecified: Secondary | ICD-10-CM | POA: Diagnosis not present

## 2023-08-05 DIAGNOSIS — I10 Essential (primary) hypertension: Secondary | ICD-10-CM | POA: Diagnosis not present

## 2023-08-05 DIAGNOSIS — R5382 Chronic fatigue, unspecified: Secondary | ICD-10-CM | POA: Diagnosis not present

## 2023-08-10 DIAGNOSIS — E1142 Type 2 diabetes mellitus with diabetic polyneuropathy: Secondary | ICD-10-CM | POA: Diagnosis not present

## 2023-08-10 DIAGNOSIS — I10 Essential (primary) hypertension: Secondary | ICD-10-CM | POA: Diagnosis not present

## 2023-08-11 DIAGNOSIS — E1169 Type 2 diabetes mellitus with other specified complication: Secondary | ICD-10-CM | POA: Diagnosis not present

## 2023-08-24 DIAGNOSIS — E1142 Type 2 diabetes mellitus with diabetic polyneuropathy: Secondary | ICD-10-CM | POA: Diagnosis not present

## 2023-08-25 DIAGNOSIS — E1169 Type 2 diabetes mellitus with other specified complication: Secondary | ICD-10-CM | POA: Diagnosis not present

## 2023-09-02 ENCOUNTER — Encounter (INDEPENDENT_AMBULATORY_CARE_PROVIDER_SITE_OTHER): Admitting: Ophthalmology

## 2023-09-06 ENCOUNTER — Ambulatory Visit (INDEPENDENT_AMBULATORY_CARE_PROVIDER_SITE_OTHER): Admitting: Podiatry

## 2023-09-06 ENCOUNTER — Encounter: Payer: Self-pay | Admitting: Podiatry

## 2023-09-06 VITALS — Ht 63.0 in | Wt 220.2 lb

## 2023-09-06 DIAGNOSIS — M79675 Pain in left toe(s): Secondary | ICD-10-CM | POA: Diagnosis not present

## 2023-09-06 DIAGNOSIS — M79674 Pain in right toe(s): Secondary | ICD-10-CM | POA: Diagnosis not present

## 2023-09-06 DIAGNOSIS — B351 Tinea unguium: Secondary | ICD-10-CM | POA: Diagnosis not present

## 2023-09-06 NOTE — Progress Notes (Signed)
   Chief Complaint  Patient presents with   Nail Problem    Pt is here for [facility].    SUBJECTIVE Patient presents to office today complaining of elongated, thickened nails that cause pain while ambulating in shoes.  Patient is unable to trim their own nails.   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 since 1996   Insulin  dependent diabetes mellitus    Mitral valve prolapse    Neuropathy    Osteoarthritis    bilateral knee   Seasonal allergies    Sleep apnea    no CPAP use in several months, per pt.   Systemic lupus erythematosus (HCC)    Trigger finger, left middle finger 01/2017    OBJECTIVE General Patient is awake, alert, and oriented x 3 and in no acute distress. Derm Skin is dry and supple bilateral. Negative open lesions or macerations. Remaining integument unremarkable. Nails are tender, long, thickened and dystrophic with subungual debris, consistent with onychomycosis, 1-5 bilateral. No signs of infection noted. Vasc  DP and PT pedal pulses palpable bilaterally. Temperature gradient within normal limits.  Neuro Epicritic and protective threshold sensation grossly intact bilaterally.  Musculoskeletal Exam pes planus deformity noted to the bilateral feet left greater than the right.  There is collapse of the medial longitudinal arch and tenderness along the posterior tibial tendon of the left specifically  ASSESSMENT 1.  Pain due to onychomycosis of toenails both 2.  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  3.  PTTD LLE 4.  Diabetes mellitus with peripheral polyneuropathy  PLAN OF  CARE 1. Patient evaluated today.   2. Instructed to maintain good pedal hygiene and foot care.  3. Mechanical debridement of nails 1-5 bilaterally performed using a nail nipper. Filed with dremel without incident.  4.  Continue conservative management of the pes planovalgus deformity bilateral.  Continue wearing diabetic shoes and insoles to prevent collapse of the feet bilateral during weightbearing 5.  Return to clinic in 3 months for routine foot care   Thresa EMERSON Sar, DPM [facility]  Dr. Thresa EMERSON Sar, DPM    [address] [place], [place] [address]                Office [phone]  Fax [phone]

## 2023-09-08 ENCOUNTER — Encounter (INDEPENDENT_AMBULATORY_CARE_PROVIDER_SITE_OTHER): Admitting: Ophthalmology

## 2023-09-08 DIAGNOSIS — H33303 Unspecified retinal break, bilateral: Secondary | ICD-10-CM

## 2023-09-08 DIAGNOSIS — H35033 Hypertensive retinopathy, bilateral: Secondary | ICD-10-CM | POA: Diagnosis not present

## 2023-09-08 DIAGNOSIS — E113311 Type 2 diabetes mellitus with moderate nonproliferative diabetic retinopathy with macular edema, right eye: Secondary | ICD-10-CM | POA: Diagnosis not present

## 2023-09-08 DIAGNOSIS — E113292 Type 2 diabetes mellitus with mild nonproliferative diabetic retinopathy without macular edema, left eye: Secondary | ICD-10-CM

## 2023-09-08 DIAGNOSIS — I10 Essential (primary) hypertension: Secondary | ICD-10-CM | POA: Diagnosis not present

## 2023-09-08 DIAGNOSIS — Z794 Long term (current) use of insulin: Secondary | ICD-10-CM | POA: Diagnosis not present

## 2023-09-08 DIAGNOSIS — H43813 Vitreous degeneration, bilateral: Secondary | ICD-10-CM | POA: Diagnosis not present

## 2023-09-10 DIAGNOSIS — E1169 Type 2 diabetes mellitus with other specified complication: Secondary | ICD-10-CM | POA: Diagnosis not present

## 2023-09-24 DIAGNOSIS — E162 Hypoglycemia, unspecified: Secondary | ICD-10-CM | POA: Diagnosis not present

## 2023-09-24 DIAGNOSIS — E1142 Type 2 diabetes mellitus with diabetic polyneuropathy: Secondary | ICD-10-CM | POA: Diagnosis not present

## 2023-09-24 DIAGNOSIS — J069 Acute upper respiratory infection, unspecified: Secondary | ICD-10-CM | POA: Diagnosis not present

## 2023-09-24 DIAGNOSIS — I10 Essential (primary) hypertension: Secondary | ICD-10-CM | POA: Diagnosis not present

## 2023-09-28 ENCOUNTER — Emergency Department (HOSPITAL_COMMUNITY)

## 2023-09-28 ENCOUNTER — Inpatient Hospital Stay (HOSPITAL_COMMUNITY)
Admission: EM | Admit: 2023-09-28 | Discharge: 2023-09-30 | DRG: 641 | Disposition: A | Discharge status: Home / Self Care | Attending: Internal Medicine | Admitting: Internal Medicine

## 2023-09-28 ENCOUNTER — Other Ambulatory Visit: Payer: Self-pay

## 2023-09-28 ENCOUNTER — Encounter (HOSPITAL_COMMUNITY): Payer: Self-pay | Admitting: Emergency Medicine

## 2023-09-28 DIAGNOSIS — I341 Nonrheumatic mitral (valve) prolapse: Secondary | ICD-10-CM | POA: Diagnosis not present

## 2023-09-28 DIAGNOSIS — Z79899 Other long term (current) drug therapy: Secondary | ICD-10-CM | POA: Diagnosis not present

## 2023-09-28 DIAGNOSIS — E872 Acidosis, unspecified: Principal | ICD-10-CM | POA: Diagnosis present

## 2023-09-28 DIAGNOSIS — Z7901 Long term (current) use of anticoagulants: Secondary | ICD-10-CM

## 2023-09-28 DIAGNOSIS — Z96653 Presence of artificial knee joint, bilateral: Secondary | ICD-10-CM | POA: Diagnosis present

## 2023-09-28 DIAGNOSIS — K828 Other specified diseases of gallbladder: Secondary | ICD-10-CM | POA: Diagnosis not present

## 2023-09-28 DIAGNOSIS — Z91041 Radiographic dye allergy status: Secondary | ICD-10-CM | POA: Diagnosis not present

## 2023-09-28 DIAGNOSIS — R1084 Generalized abdominal pain: Secondary | ICD-10-CM | POA: Diagnosis not present

## 2023-09-28 DIAGNOSIS — S0990XA Unspecified injury of head, initial encounter: Secondary | ICD-10-CM | POA: Diagnosis present

## 2023-09-28 DIAGNOSIS — Y92009 Unspecified place in unspecified non-institutional (private) residence as the place of occurrence of the external cause: Secondary | ICD-10-CM

## 2023-09-28 DIAGNOSIS — R58 Hemorrhage, not elsewhere classified: Secondary | ICD-10-CM | POA: Diagnosis not present

## 2023-09-28 DIAGNOSIS — K8020 Calculus of gallbladder without cholecystitis without obstruction: Secondary | ICD-10-CM | POA: Diagnosis present

## 2023-09-28 DIAGNOSIS — E11319 Type 2 diabetes mellitus with unspecified diabetic retinopathy without macular edema: Secondary | ICD-10-CM | POA: Diagnosis present

## 2023-09-28 DIAGNOSIS — G4730 Sleep apnea, unspecified: Secondary | ICD-10-CM | POA: Diagnosis present

## 2023-09-28 DIAGNOSIS — I10 Essential (primary) hypertension: Secondary | ICD-10-CM | POA: Diagnosis present

## 2023-09-28 DIAGNOSIS — W1830XA Fall on same level, unspecified, initial encounter: Secondary | ICD-10-CM | POA: Diagnosis present

## 2023-09-28 DIAGNOSIS — R55 Syncope and collapse: Secondary | ICD-10-CM | POA: Diagnosis not present

## 2023-09-28 DIAGNOSIS — I951 Orthostatic hypotension: Principal | ICD-10-CM | POA: Diagnosis present

## 2023-09-28 DIAGNOSIS — K219 Gastro-esophageal reflux disease without esophagitis: Secondary | ICD-10-CM | POA: Diagnosis present

## 2023-09-28 DIAGNOSIS — Z833 Family history of diabetes mellitus: Secondary | ICD-10-CM

## 2023-09-28 DIAGNOSIS — M545 Low back pain, unspecified: Secondary | ICD-10-CM | POA: Diagnosis not present

## 2023-09-28 DIAGNOSIS — Z7984 Long term (current) use of oral hypoglycemic drugs: Secondary | ICD-10-CM | POA: Diagnosis not present

## 2023-09-28 DIAGNOSIS — Z888 Allergy status to other drugs, medicaments and biological substances status: Secondary | ICD-10-CM | POA: Diagnosis not present

## 2023-09-28 DIAGNOSIS — M329 Systemic lupus erythematosus, unspecified: Secondary | ICD-10-CM | POA: Diagnosis present

## 2023-09-28 DIAGNOSIS — Z794 Long term (current) use of insulin: Secondary | ICD-10-CM | POA: Diagnosis not present

## 2023-09-28 DIAGNOSIS — Z86718 Personal history of other venous thrombosis and embolism: Secondary | ICD-10-CM

## 2023-09-28 DIAGNOSIS — M542 Cervicalgia: Secondary | ICD-10-CM | POA: Diagnosis not present

## 2023-09-28 DIAGNOSIS — D72829 Elevated white blood cell count, unspecified: Secondary | ICD-10-CM | POA: Diagnosis present

## 2023-09-28 DIAGNOSIS — K5790 Diverticulosis of intestine, part unspecified, without perforation or abscess without bleeding: Secondary | ICD-10-CM | POA: Diagnosis not present

## 2023-09-28 DIAGNOSIS — G4733 Obstructive sleep apnea (adult) (pediatric): Secondary | ICD-10-CM | POA: Diagnosis not present

## 2023-09-28 DIAGNOSIS — S3993XA Unspecified injury of pelvis, initial encounter: Secondary | ICD-10-CM | POA: Diagnosis not present

## 2023-09-28 DIAGNOSIS — Z86711 Personal history of pulmonary embolism: Secondary | ICD-10-CM | POA: Diagnosis not present

## 2023-09-28 DIAGNOSIS — W19XXXA Unspecified fall, initial encounter: Principal | ICD-10-CM

## 2023-09-28 DIAGNOSIS — M47812 Spondylosis without myelopathy or radiculopathy, cervical region: Secondary | ICD-10-CM | POA: Diagnosis not present

## 2023-09-28 DIAGNOSIS — I700 Atherosclerosis of aorta: Secondary | ICD-10-CM | POA: Diagnosis not present

## 2023-09-28 DIAGNOSIS — E1169 Type 2 diabetes mellitus with other specified complication: Secondary | ICD-10-CM | POA: Diagnosis present

## 2023-09-28 DIAGNOSIS — M160 Bilateral primary osteoarthritis of hip: Secondary | ICD-10-CM | POA: Diagnosis not present

## 2023-09-28 DIAGNOSIS — R079 Chest pain, unspecified: Secondary | ICD-10-CM | POA: Diagnosis not present

## 2023-09-28 DIAGNOSIS — E669 Obesity, unspecified: Secondary | ICD-10-CM | POA: Diagnosis present

## 2023-09-28 LAB — I-STAT CHEM 8, ED
BUN: 26 mg/dL — ABNORMAL HIGH (ref 8–23)
Calcium, Ion: 1.07 mmol/L — ABNORMAL LOW (ref 1.15–1.40)
Chloride: 103 mmol/L (ref 98–111)
Creatinine, Ser: 1.1 mg/dL — ABNORMAL HIGH (ref 0.44–1.00)
Glucose, Bld: 186 mg/dL — ABNORMAL HIGH (ref 70–99)
HCT: 39 % (ref 36.0–46.0)
Hemoglobin: 13.3 g/dL (ref 12.0–15.0)
Potassium: 5.1 mmol/L (ref 3.5–5.1)
Sodium: 137 mmol/L (ref 135–145)
TCO2: 24 mmol/L (ref 22–32)

## 2023-09-28 LAB — I-STAT CG4 LACTIC ACID, ED: Lactic Acid, Venous: 2.9 mmol/L (ref 0.5–1.9)

## 2023-09-28 LAB — COMPREHENSIVE METABOLIC PANEL WITH GFR
ALT: 16 U/L (ref 0–44)
AST: 28 U/L (ref 15–41)
Albumin: 3.3 g/dL — ABNORMAL LOW (ref 3.5–5.0)
Alkaline Phosphatase: 91 U/L (ref 38–126)
Anion gap: 8 (ref 5–15)
BUN: 22 mg/dL (ref 8–23)
CO2: 24 mmol/L (ref 22–32)
Calcium: 8.5 mg/dL — ABNORMAL LOW (ref 8.9–10.3)
Chloride: 104 mmol/L (ref 98–111)
Creatinine, Ser: 1 mg/dL (ref 0.44–1.00)
GFR, Estimated: 57 mL/min — ABNORMAL LOW (ref >60)
Glucose, Bld: 190 mg/dL — ABNORMAL HIGH (ref 70–99)
Potassium: 5 mmol/L (ref 3.5–5.1)
Sodium: 136 mmol/L (ref 135–145)
Total Bilirubin: 0.5 mg/dL (ref 0.0–1.2)
Total Protein: 6.3 g/dL — ABNORMAL LOW (ref 6.5–8.1)

## 2023-09-28 LAB — URINALYSIS, ROUTINE W REFLEX MICROSCOPIC
Bilirubin Urine: NEGATIVE
Glucose, UA: >=500 mg/dL — AB
Ketones, ur: NEGATIVE mg/dL
Nitrite: NEGATIVE
Protein, ur: NEGATIVE mg/dL
Specific Gravity, Urine: 1.016 (ref 1.005–1.030)
pH: 6 (ref 5.0–8.0)

## 2023-09-28 LAB — CBC WITH DIFFERENTIAL/PLATELET
Abs Immature Granulocytes: 0.09 K/uL — ABNORMAL HIGH (ref 0.00–0.07)
Basophils Absolute: 0.1 K/uL (ref 0.0–0.1)
Basophils Relative: 0 %
Eosinophils Absolute: 0.2 K/uL (ref 0.0–0.5)
Eosinophils Relative: 1 %
HCT: 38.5 % (ref 36.0–46.0)
Hemoglobin: 11.9 g/dL — ABNORMAL LOW (ref 12.0–15.0)
Immature Granulocytes: 0 %
Lymphocytes Relative: 64 %
Lymphs Abs: 23.3 K/uL — ABNORMAL HIGH (ref 0.7–4.0)
MCH: 27.6 pg (ref 26.0–34.0)
MCHC: 30.9 g/dL (ref 30.0–36.0)
MCV: 89.3 fL (ref 80.0–100.0)
Monocytes Absolute: 6.7 K/uL — ABNORMAL HIGH (ref 0.1–1.0)
Monocytes Relative: 19 %
Neutro Abs: 5.8 K/uL (ref 1.7–7.7)
Neutrophils Relative %: 16 %
Platelets: 303 K/uL (ref 150–400)
RBC: 4.31 MIL/uL (ref 3.87–5.11)
RDW: 17.9 % — ABNORMAL HIGH (ref 11.5–15.5)
Smear Review: NORMAL
WBC: 36.1 K/uL — ABNORMAL HIGH (ref 4.0–10.5)
nRBC: 0 % (ref 0.0–0.2)

## 2023-09-28 LAB — LIPASE, BLOOD: Lipase: 37 U/L (ref 11–51)

## 2023-09-28 LAB — TROPONIN I (HIGH SENSITIVITY): Troponin I (High Sensitivity): 5 ng/L (ref <18)

## 2023-09-28 MED ORDER — ACETAMINOPHEN 325 MG PO TABS
975.0000 mg | ORAL_TABLET | Freq: Once | ORAL | Status: AC
Start: 2023-09-28 — End: 2023-09-28
  Administered 2023-09-28: 975 mg via ORAL
  Filled 2023-09-28: qty 3

## 2023-09-28 MED ORDER — SODIUM CHLORIDE 0.9 % IV BOLUS
1000.0000 mL | Freq: Once | INTRAVENOUS | Status: AC
Start: 2023-09-28 — End: 2023-09-29
  Administered 2023-09-28: 1000 mL via INTRAVENOUS

## 2023-09-28 NOTE — ED Notes (Signed)
 Patient transported to CT

## 2023-09-28 NOTE — Progress Notes (Signed)
 Orthopedic Tech Progress Note Patient Details:  [name] Aug 10, 1945 [id number]  Patient ID: Kaitlyn Jackson, female   DOB: 1945-07-02, 78 y.o.   MRN: [id number] Responded to level 2 trauma ortho techs currently not needed Camellia Bo 09/28/2023, 9:33 PM

## 2023-09-28 NOTE — ED Triage Notes (Addendum)
 Pt to ED via [facility] from home as level 2 truama for mechanical fall per EMS, pt on warfarin.  Pt unsure why she may have fallen.  Pain to neck and right abd.  EDP at bedside on arrival.

## 2023-09-28 NOTE — ED Notes (Signed)
 CCMD called.

## 2023-09-28 NOTE — ED Provider Notes (Incomplete)
 [place] EMERGENCY DEPARTMENT AT [facility] Provider Note   CSN: [id number] Arrival date & time: 09/28/23  2107     Patient presents with: Kaitlyn Jackson is a 78 y.o. female with past medical history of insulin -dependent T2DM, HTN, lupus, bilateral TKR, OSA, DVT, peripheral neuropathy presents to emergency department via EMS for evaluation of neck pain, right hip pain, chest pain, abdominal pain following a fall today.  Reports that she was walking when she believes that she tripped over something and hit her head on the corner of the door.  Denies LOC, visual disturbances, complaints prior to fall.  {Add pertinent medical, surgical, social history, OB history to HPI:32947} HPI     Prior to Admission medications   Medication Sig Start Date End Date Taking? Authorizing Provider  ACCU-CHEK AVIVA PLUS test strip CHECK BLOOD GLUCOSE TWICE A DAY 10/04/17   [provider]  acetaminophen -codeine  (TYLENOL  #3) 300-30 MG tablet Take 1 tablet by mouth every 6 (six) hours as needed for moderate pain. 11/19/22   Kuzma, Kevin, MD  atorvastatin  (LIPITOR) 20 MG tablet Take 10 mg by mouth at bedtime.    [provider]  azelastine  (ASTELIN ) 0.1 % nasal spray azelastine  137 mcg (0.1 %) nasal spray aerosol  PLACE 2 SPRAYS INTO BOTH NOSTRILS 2 (TWO) TIMES DAILY.    [provider]  benazepril  (LOTENSIN ) 40 MG tablet Take 40 mg by mouth daily.     [provider]  BESIVANCE 0.6 % SUSP Place 1 drop into the right eye See admin instructions. Instill one drop into right eye 4 times daily for 2 days after each monthly eye injection. 10/03/18   [provider]  Blood Glucose Monitoring Suppl (GLUCOCOM BLOOD GLUCOSE MONITOR) DEVI Accu-Chek Aviva Plus Meter    [provider]  Calcitriol-Fluticas-Tarcrolim (TRIDERMA FORTE EX) Apply 1 application topically 3 (three) times daily as needed (for pain).    [provider]   cholecalciferol  (VITAMIN D ) 1000 UNITS tablet Take 1,000 Units by mouth at bedtime.     [provider]  Cholecalciferol  25 MCG (1000 UT) capsule Vitamin D3 25 mcg (1,000 unit) capsule  Take by oral route.    [provider]  esomeprazole (NEXIUM) 40 MG capsule Take 40 mg by mouth daily before breakfast.    [provider]  FARXIGA 10 MG TABS tablet Take 10 mg by mouth daily. 04/20/22   [provider]  fluticasone  (FLONASE ) 50 MCG/ACT nasal spray Place 1 spray into both nostrils daily as needed for allergies.     [provider]  furosemide  (LASIX ) 40 MG tablet Take 40 mg by mouth daily.    [provider]  HUMALOG MIX 75/25 KWIKPEN (75-25) 100 UNIT/ML Kwikpen Inject 44 Units into the skin daily. 08/26/18   [provider]  hydrocortisone 2.5 % lotion Apply topically. 01/09/20   [provider]  hydrOXYzine  (ATARAX /VISTARIL ) 25 MG tablet Take 25 mg by mouth daily. 01/04/20   [provider]  insulin  aspart (NOVOLOG ) 100 UNIT/ML injection Inject into the skin. 07/17/16   [provider]  Insulin  Pen Needle (NOVOFINE) 30G X 8 MM MISC NovoFine 30 30 gauge x 1/3 needle    [provider]  Insulin  Pen Needle 31G X 8 MM MISC BD Ultra-Fine Short Pen Needle 31 gauge x 5/16    [provider]  Insulin  Pen Needle 32G X 4 MM MISC NovoFine Plus 32 gauge x 1/6 needle  USE 2  TIMES DAILY    [provider]  Lancet Devices ([employer] LANCING DEVICE) MISC Accu-Chek FastClix Lancing Device    [provider]  Lancets Misc. (ACCU-CHEK FASTCLIX LANCET) KIT Accu-Chek FastClix Lancing Device    [provider]  metFORMIN (GLUCOPHAGE) 1000 MG tablet Take 1,000 mg by mouth at bedtime. 09/10/17   [provider]  montelukast  (SINGULAIR ) 10 MG tablet Take 10 mg by mouth at bedtime.    [provider]  pregabalin  (LYRICA ) 100 MG capsule Take 100 mg by mouth 2 (two) times daily.     [provider]  Semaglutide (RYBELSUS) 14 MG TABS Take by mouth.    [provider]  sitaGLIPtin-metformin (JANUMET) 50-1000 MG tablet Take 1 tablet by mouth every morning.     [provider]  verapamil  (CALAN -SR) 180 MG CR tablet Take 180 mg by mouth 2 (two) times daily. 01/09/19   [provider]  vitamin C  (ASCORBIC ACID ) 500 MG tablet Take 500 mg by mouth daily.     [provider]  warfarin (COUMADIN ) 10 MG tablet Take 10 mg by mouth See admin instructions. Take 1 tablet by mouth every other day in the evening, alternating with 7.5mg  tablet 10/24/16   [provider]  warfarin (COUMADIN ) 7.5 MG tablet Take 7.5 mg by mouth See admin instructions. Take 1 tablet by mouth every other day in the evening, alternating with 10mg  tablet 10/24/16   [provider]    Allergies: Iodine and Metronidazole    Review of Systems  Gastrointestinal:  Positive for abdominal pain.    Updated Vital Signs BP (!) 141/75   Pulse 71   Temp 97.6 F (36.4 C) (Oral)   Resp 17   Ht 5' 3 (1.6 m)   Wt 92.5 kg   SpO2 100%   BMI 36.14 kg/m   Physical Exam Vitals and nursing note reviewed.  Constitutional:      General: She is not in acute distress.    Appearance: Normal appearance. She is not diaphoretic.  HENT:     Head: Normocephalic and atraumatic.     Comments: No hematoma nor TTP of cranium No crepitus to facial bones    Right Ear: External ear normal. No hemotympanum.     Left Ear: External ear normal. No hemotympanum.     Nose: Nose normal.     Right Nostril: No epistaxis or septal hematoma.     Left Nostril: No epistaxis or septal hematoma.     Mouth/Throat:     Mouth: Mucous membranes are moist. No injury or lacerations.  Eyes:     General: Lids are normal. Vision grossly intact. No visual field deficit.       Right eye: No discharge.        Left eye: No discharge.     Extraocular Movements: Extraocular movements intact.      Right eye: Normal extraocular motion and no nystagmus.     Left eye: Normal extraocular motion and no nystagmus.     Conjunctiva/sclera: Conjunctivae normal.     Pupils: Pupils are equal, round, and reactive to light.     Comments: No subconjunctival hemorrhage, hyphema, tear drop pupil, or fluid leakage bilaterally.  No signs of EOM entrapment  Neck:     Vascular: No carotid bruit.  Cardiovascular:     Rate and Rhythm: Normal rate.     Pulses: Normal pulses.          Radial pulses are 2+ on the  right side and 2+ on the left side.       Dorsalis pedis pulses are 2+ on the right side and 2+ on the left side.  Pulmonary:     Effort: Pulmonary effort is normal. No respiratory distress.     Breath sounds: Normal breath sounds. No wheezing.  Chest:     Comments: Very mild chest wall tenderness Abdominal:     General: Bowel sounds are normal. There is no distension.     Palpations: Abdomen is soft.     Tenderness: There is no abdominal tenderness. There is no guarding or rebound.  Musculoskeletal:     Cervical back: Full passive range of motion without pain, normal range of motion and neck supple. No deformity, rigidity or bony tenderness. Spinous process tenderness present. Normal range of motion.     Thoracic back: No deformity or bony tenderness. Normal range of motion.     Lumbar back: No deformity or bony tenderness. Normal range of motion.     Right hip: No bony tenderness or crepitus.     Left hip: No bony tenderness or crepitus.     Right lower leg: No edema.     Left lower leg: No edema.     Comments: No obvious deformity to joints or long bones Pelvis stable with no shortening or rotation of LE bilaterally  Skin:    General: Skin is warm and dry.     Capillary Refill: Capillary refill takes less than 2 seconds.  Neurological:     General: No focal deficit present.     Mental Status: She is alert and oriented to person, place, and time. Mental status is at baseline.     GCS:  GCS eye subscore is 4. GCS verbal subscore is 5. GCS motor subscore is 6.     Cranial Nerves: Cranial nerves 2-12 are intact. No cranial nerve deficit, dysarthria or facial asymmetry.     Sensory: Sensation is intact. No sensory deficit.     Motor: No weakness, tremor, abnormal muscle tone, seizure activity or pronator drift.     Coordination: Coordination is intact. Coordination normal. Finger-Nose-Finger Test and Heel to [facility] Test normal.     Deep Tendon Reflexes: Reflexes are normal and symmetric. Reflexes normal.     Comments: following commands appropriately.  Grip strength equal     (all labs ordered are listed, but only abnormal results are displayed) Labs Reviewed  CBC WITH DIFFERENTIAL/PLATELET - Abnormal; Notable for the following components:      Result Value   WBC 36.1 (*)    Hemoglobin 11.9 (*)    RDW 17.9 (*)    Lymphs Abs 23.3 (*)    Monocytes Absolute 6.7 (*)    Abs Immature Granulocytes 0.09 (*)    All other components within normal limits  COMPREHENSIVE METABOLIC PANEL WITH GFR - Abnormal; Notable for the following components:   Glucose, Bld 190 (*)    Calcium  8.5 (*)    Total Protein 6.3 (*)    Albumin 3.3 (*)    GFR, Estimated 57 (*)    All other components within normal limits  URINALYSIS, ROUTINE W REFLEX MICROSCOPIC - Abnormal; Notable for the following components:   Color, Urine STRAW (*)    Glucose, UA >=500 (*)    Hgb urine dipstick MODERATE (*)    Leukocytes,Ua TRACE (*)    Bacteria, UA RARE (*)    All other components within normal limits  I-STAT CHEM 8, ED - Abnormal;  Notable for the following components:   BUN 26 (*)    Creatinine, Ser 1.10 (*)    Glucose, Bld 186 (*)    Calcium , Ion 1.07 (*)    All other components within normal limits  I-STAT CG4 LACTIC ACID, ED - Abnormal; Notable for the following components:   Lactic Acid, Venous 2.9 (*)    All other components within normal limits  LIPASE, BLOOD  I-STAT CG4 LACTIC ACID, ED  TROPONIN I  (HIGH SENSITIVITY)  TROPONIN I (HIGH SENSITIVITY)    EKG: EKG Interpretation Date/Time:  Tuesday September 28 2023 21:28:42 EDT Ventricular Rate:  65 PR Interval:  197 QRS Duration:  102 QT Interval:  385 QTC Calculation: 401 R Axis:   -20  Text Interpretation: Sinus rhythm Borderline left axis deviation Low voltage, precordial leads Consider anterior infarct Confirmed by Neysa Clap [phone]) on 09/28/2023 10:33:27 PM  Radiology: CT ABDOMEN PELVIS WO CONTRAST Result Date: 09/28/2023 CLINICAL DATA:  Generalized abdominal pain following fall, initial encounter EXAM: CT ABDOMEN AND PELVIS WITHOUT CONTRAST TECHNIQUE: Multidetector CT imaging of the abdomen and pelvis was performed following the standard protocol without IV contrast. RADIATION DOSE REDUCTION: This exam was performed according to the departmental dose-optimization program which includes automated exposure control, adjustment of the mA and/or kV according to patient size and/or use of iterative reconstruction technique. COMPARISON:  None Available. FINDINGS: Lower chest: No acute abnormality. Hepatobiliary: Liver is within normal limits. Gallbladder is well distended with multiple dependent stones extending into the gallbladder neck. No complicating factors are noted. Pancreas: Unremarkable. No pancreatic ductal dilatation or surrounding inflammatory changes. Spleen: Normal in size without focal abnormality. Adrenals/Urinary Tract: Adrenal glands are within normal limits. Kidneys are well visualized bilaterally without renal calculi or obstructive changes. The bladder is well distended. Stomach/Bowel: Scattered mild diverticular changes noted without evidence of diverticulitis. Fecal material is noted throughout the colon without obstructive change. The appendix is unremarkable. Small bowel and stomach are within normal limits. Vascular/Lymphatic: Aortic atherosclerosis. No enlarged abdominal or pelvic lymph nodes. Reproductive: Calcified  uterine fibroids are noted. No adnexal mass is seen. Other: No abdominal wall hernia or abnormality. No abdominopelvic ascites. Musculoskeletal: Degenerative changes are noted worst at T11-T12. IMPRESSION: Cholelithiasis without complicating factors. Diverticulosis without diverticulitis. Electronically Signed   By: Oneil Devonshire M.D.   On: 09/28/2023 21:52   CT Head Wo Contrast Result Date: 09/28/2023 EXAM: CT HEAD WITHOUT CONTRAST 09/28/2023 09:41:30 PM TECHNIQUE: CT of the head was performed without the administration of intravenous contrast. Automated exposure control, iterative reconstruction, and/or weight based adjustment of the mA/kV was utilized to reduce the radiation dose to as low as reasonably achievable. COMPARISON: 02/14/2022 CLINICAL HISTORY: Head trauma, moderate-severe. FINDINGS: BRAIN AND VENTRICLES: No acute hemorrhage. Gray-white differentiation is preserved. No hydrocephalus. No extra-axial collection. No mass effect or midline shift. ORBITS: No acute abnormality. SINUSES: Interval increase in the left mastoid effusion. SOFT TISSUES AND SKULL: No acute soft tissue abnormality. No skull fracture. IMPRESSION: 1. No acute intracranial abnormality. Electronically signed by: Norman Gatlin MD 09/28/2023 09:49 PM EDT RP Workstation: [id number]   CT Cervical Spine Wo Contrast Result Date: 09/28/2023 CLINICAL DATA:  Recent fall with neck pain, initial encounter EXAM: CT CERVICAL SPINE WITHOUT CONTRAST TECHNIQUE: Multidetector CT imaging of the cervical spine was performed without intravenous contrast. Multiplanar CT image reconstructions were also generated. RADIATION DOSE REDUCTION: This exam was performed according to the departmental dose-optimization program which includes automated exposure control, adjustment of the mA and/or kV according to patient  size and/or use of iterative reconstruction technique. COMPARISON:  None Available. FINDINGS: Alignment: Straightening of the normal cervical  lordosis is noted likely related to muscular spasm. Skull base and vertebrae: 7 cervical segments are well visualized. Vertebral body height is well maintained. Multilevel osteophytic changes and facet hypertrophic changes are noted. No acute fracture or acute facet abnormality is noted. Note is made of left mastoid effusion. Soft tissues and spinal canal: Surrounding soft tissue structures are within normal limits. Upper chest: Visualized lung apices are unremarkable. Other: None IMPRESSION: Multilevel degenerative change without acute abnormality. Electronically Signed   By: Oneil Devonshire M.D.   On: 09/28/2023 21:48   DG Pelvis Portable Result Date: 09/28/2023 CLINICAL DATA:  Trauma, fall. EXAM: PORTABLE PELVIS 1-2 VIEWS COMPARISON:  None Available. FINDINGS: There is no evidence of pelvic fracture or diastasis. No pelvic bone lesions are seen. There are mild degenerative changes of both hips. IMPRESSION: No acute fracture or dislocation. Mild degenerative changes of both hips. Electronically Signed   By: Greig Pique M.D.   On: 09/28/2023 21:36   DG Chest Port 1 View Result Date: 09/28/2023 CLINICAL DATA:  Recent fall with chest pain, initial encounter EXAM: PORTABLE CHEST 1 VIEW COMPARISON:  10/17/2019 FINDINGS: Cardiac shadow is mildly prominent but accentuated by the portable technique. Aortic calcifications are noted. The lungs are well aerated bilaterally. No acute bony abnormality is seen. IMPRESSION: No acute abnormality noted. Electronically Signed   By: Oneil Devonshire M.D.   On: 09/28/2023 21:35      Medications Ordered in the ED  acetaminophen  (TYLENOL ) tablet 975 mg (975 mg Oral Given 09/28/23 2143)  sodium chloride  0.9 % bolus 1,000 mL (1,000 mLs Intravenous New Bag/Given 09/28/23 2246)      {Click here for ABCD2, HEART and other calculators REFRESH Note before signing:1}                              Medical Decision Making Amount and/or Complexity of Data Reviewed Labs:  ordered. Radiology: ordered.  Risk OTC drugs.   Patient presents to the ED for concern of neck pain, right hip pain, chest pain, abdominal pain, head injury following a fall, this involves an extensive number of treatment options, and is a complaint that carries with it a high risk of complications and morbidity.  The differential diagnosis includes fracture, dislocation, contusion, intrathoracic injury, ICH   Co morbidities that complicate the patient evaluation  See HPI   Additional history obtained:  Additional history obtained from EMS and Nursing   External records from outside source obtained and reviewed including triage RN note   Lab Tests:  I Ordered, and personally interpreted labs.  The pertinent results include:   WBC 36.1 Lactic 2.85 Hgb 11.9 Creatinine 1.10 BUN 26 Troponin x 1 negative CBG 190   Imaging Studies ordered:  I ordered imaging studies including CT head, CT cervical spine, CT abdomen pelvis, chest x-ray, pelvis x-ray I independently visualized and interpreted imaging which showed chololithiasis wo complications I agree with the radiologist interpretation   Cardiac Monitoring:  The patient was maintained on a cardiac monitor.  I personally viewed and interpreted the cardiac monitored which showed an underlying rhythm of: NSR in 65bpm with no T wave nor ST abnormalities   Medicines ordered and prescription drug management:  I ordered medication including IVF, tylenol   for pain  Reevaluation of the patient after these medicines showed that the patient improved I  have reviewed the patients home medicines and have made adjustments as needed     Problem List / ED Course:  Fall Head injury Neuro intact with no focal deficits.  TTP of cervical neck, abd diffusely,    Reevaluation:  After the interventions noted above, I reevaluated the patient and found that they have :stayed the same   Social Determinants of Health:  Has  PCP   Dispostion:  After consideration of the diagnostic results and the patients response to treatment, I feel that the patent would benefit from admission to r/o SIRS/sepsis.   Discussed ED workup, disposition, return to ED precautions with patient who expresses understanding and agrees with plan  Final diagnoses:  Fall, initial encounter  Injury of head, initial encounter  Leukocytosis, unspecified type  Generalized abdominal pain  Calculus of gallbladder without cholecystitis without obstruction    ED Discharge Orders     None

## 2023-09-28 NOTE — ED Provider Notes (Signed)
 [place] [facility] AT [facility] Provider Note   CSN: [id number] Arrival date & time: 09/28/23  2107     Patient presents with: No chief complaint on file.   Kaitlyn Jackson is a 78 y.o. female with past medical history of insulin -dependent T2DM, HTN, lupus, bilateral TKR, OSA, DVT, peripheral neuropathy presents to emergency department via EMS for evaluation of neck pain, right hip pain, chest pain, abdominal pain following a fall today.  Reports that she was walking when she believes that she tripped over something and hit her head on the corner of the door.  Denies LOC, visual disturbances, complaints prior to fall.  {Add pertinent medical, surgical, social history, OB history to HPI:32947} HPI     Prior to Admission medications   Medication Sig Start Date End Date Taking? Authorizing Provider  ACCU-CHEK AVIVA PLUS test strip CHECK BLOOD GLUCOSE TWICE A DAY 10/04/17   [provider]  acetaminophen -codeine  (TYLENOL  #3) 300-30 MG tablet Take 1 tablet by mouth every 6 (six) hours as needed for moderate pain. 11/19/22   Kuzma, Kevin, MD  atorvastatin  (LIPITOR) 20 MG tablet Take 10 mg by mouth at bedtime.    [provider]  azelastine  (ASTELIN ) 0.1 % nasal spray azelastine  137 mcg (0.1 %) nasal spray aerosol  PLACE 2 SPRAYS INTO BOTH NOSTRILS 2 (TWO) TIMES DAILY.    [provider]  benazepril  (LOTENSIN ) 40 MG tablet Take 40 mg by mouth daily.     [provider]  BESIVANCE 0.6 % SUSP Place 1 drop into the right eye See admin instructions. Instill one drop into right eye 4 times daily for 2 days after each monthly eye injection. 10/03/18   [provider]  Blood Glucose Monitoring Suppl (GLUCOCOM BLOOD GLUCOSE MONITOR) DEVI Accu-Chek Aviva Plus Meter    [provider]  Calcitriol-Fluticas-Tarcrolim (TRIDERMA FORTE EX) Apply 1 application topically 3 (three) times daily as needed (for pain).    [provider]  cholecalciferol  (VITAMIN D ) 1000 UNITS tablet Take 1,000 Units by mouth at bedtime.     [provider]  Cholecalciferol  25 MCG (1000 UT) capsule Vitamin D3 25 mcg (1,000 unit) capsule  Take by oral route.    [provider]  esomeprazole (NEXIUM) 40 MG capsule Take 40 mg by mouth daily before breakfast.    [provider]  FARXIGA 10 MG TABS tablet Take 10 mg by mouth daily. 04/20/22   [provider]  fluticasone  (FLONASE ) 50 MCG/ACT nasal spray Place 1 spray into both nostrils daily as needed for allergies.     [provider]  furosemide  (LASIX ) 40 MG tablet Take 40 mg by mouth daily.    [provider]  HUMALOG MIX 75/25 KWIKPEN (75-25) 100 UNIT/ML Kwikpen Inject 44 Units into the skin daily. 08/26/18   [provider]  hydrocortisone 2.5 % lotion Apply topically. 01/09/20   [provider]  hydrOXYzine  (ATARAX /VISTARIL ) 25 MG tablet Take 25 mg by mouth daily. 01/04/20   [provider]  insulin  aspart (NOVOLOG ) 100 UNIT/ML injection Inject into the skin. 07/17/16   [provider]  Insulin  Pen Needle (NOVOFINE) 30G X 8 MM MISC NovoFine 30 30 gauge x 1/3 needle    [provider]  Insulin  Pen Needle 31G X 8 MM MISC BD Ultra-Fine Short Pen Needle 31 gauge x 5/16    [provider]  Insulin  Pen Needle 32G X 4 MM MISC NovoFine Plus 32 gauge x 1/6  needle  USE 2 TIMES DAILY    [provider]  Lancet Devices ([employer] LANCING DEVICE) MISC Accu-Chek FastClix Lancing Device    [provider]  Lancets Misc. (ACCU-CHEK FASTCLIX LANCET) KIT Accu-Chek FastClix Lancing Device    [provider]  metFORMIN (GLUCOPHAGE) 1000 MG tablet Take 1,000 mg by mouth at bedtime. 09/10/17   [provider]  montelukast  (SINGULAIR ) 10 MG tablet Take 10 mg by mouth at bedtime.    [provider]  pregabalin  (LYRICA ) 100 MG capsule Take 100 mg by mouth 2 (two)  times daily.    [provider]  Semaglutide (RYBELSUS) 14 MG TABS Take by mouth.    [provider]  sitaGLIPtin-metformin (JANUMET) 50-1000 MG tablet Take 1 tablet by mouth every morning.     [provider]  verapamil  (CALAN -SR) 180 MG CR tablet Take 180 mg by mouth 2 (two) times daily. 01/09/19   [provider]  vitamin C  (ASCORBIC ACID ) 500 MG tablet Take 500 mg by mouth daily.     [provider]  warfarin (COUMADIN ) 10 MG tablet Take 10 mg by mouth See admin instructions. Take 1 tablet by mouth every other day in the evening, alternating with 7.5mg  tablet 10/24/16   [provider]  warfarin (COUMADIN ) 7.5 MG tablet Take 7.5 mg by mouth See admin instructions. Take 1 tablet by mouth every other day in the evening, alternating with 10mg  tablet 10/24/16   [provider]    Allergies: Iodine and Metronidazole    Review of Systems  Gastrointestinal:  Positive for abdominal pain.    Updated Vital Signs BP 110/65   Physical Exam Vitals and nursing note reviewed.  Constitutional:      General: She is not in acute distress.    Appearance: Normal appearance. She is not diaphoretic.  HENT:     Head: Normocephalic and atraumatic.     Comments: No hematoma nor TTP of cranium No crepitus to facial bones    Right Ear: External ear normal. No hemotympanum.     Left Ear: External ear normal. No hemotympanum.     Nose: Nose normal.     Right Nostril: No epistaxis or septal hematoma.     Left Nostril: No epistaxis or septal hematoma.     Mouth/Throat:     Mouth: Mucous membranes are moist. No injury or lacerations.  Eyes:     General: Lids are normal. Vision grossly intact. No visual field deficit.       Right eye: No discharge.        Left eye: No discharge.     Extraocular Movements: Extraocular movements intact.     Right eye: Normal extraocular motion and no nystagmus.     Left eye: Normal extraocular motion and no  nystagmus.     Conjunctiva/sclera: Conjunctivae normal.     Pupils: Pupils are equal, round, and reactive to light.     Comments: No subconjunctival hemorrhage, hyphema, tear drop pupil, or fluid leakage bilaterally.  No signs of EOM entrapment  Neck:     Vascular: No carotid bruit.  Cardiovascular:     Rate and Rhythm: Normal rate.     Pulses: Normal pulses.          Radial pulses are 2+ on the right side and 2+ on the left side.       Dorsalis pedis pulses are 2+ on the right side and 2+ on the left side.  Pulmonary:  Effort: Pulmonary effort is normal. No respiratory distress.     Breath sounds: Normal breath sounds. No wheezing.  Chest:     Comments: Very mild chest wall tenderness Abdominal:     General: Bowel sounds are normal. There is no distension.     Palpations: Abdomen is soft.     Tenderness: There is no abdominal tenderness. There is no guarding or rebound.  Musculoskeletal:     Cervical back: Full passive range of motion without pain, normal range of motion and neck supple. No deformity, rigidity or bony tenderness. Spinous process tenderness present. Normal range of motion.     Thoracic back: No deformity or bony tenderness. Normal range of motion.     Lumbar back: No deformity or bony tenderness. Normal range of motion.     Right hip: No bony tenderness or crepitus.     Left hip: No bony tenderness or crepitus.     Right lower leg: No edema.     Left lower leg: No edema.     Comments: No obvious deformity to joints or long bones Pelvis stable with no shortening or rotation of LE bilaterally  Skin:    General: Skin is warm and dry.     Capillary Refill: Capillary refill takes less than 2 seconds.  Neurological:     General: No focal deficit present.     Mental Status: She is alert and oriented to person, place, and time. Mental status is at baseline.     GCS: GCS eye subscore is 4. GCS verbal subscore is 5. GCS motor subscore is 6.     Cranial Nerves: Cranial  nerves 2-12 are intact. No cranial nerve deficit, dysarthria or facial asymmetry.     Sensory: Sensation is intact. No sensory deficit.     Motor: No weakness, tremor, abnormal muscle tone, seizure activity or pronator drift.     Coordination: Coordination is intact. Coordination normal. Finger-Nose-Finger Test and Heel to [facility] Test normal.     Deep Tendon Reflexes: Reflexes are normal and symmetric. Reflexes normal.     Comments: following commands appropriately.  Grip strength equal     (all labs ordered are listed, but only abnormal results are displayed) Labs Reviewed - No data to display  EKG: None  Radiology: No results found.    Medications Ordered in the ED - No data to display    {Click here for ABCD2, HEART and other calculators REFRESH Note before signing:1}                              Medical Decision Making Amount and/or Complexity of Data Reviewed Labs: ordered. Radiology: ordered.  Risk OTC drugs.     Patient presents to the ED for concern of neck pain, right hip pain, chest pain, abdominal pain, head injury following a fall, this involves an extensive number of treatment options, and is a complaint that carries with it a high risk of complications and morbidity.  The differential diagnosis includes fracture, dislocation, contusion, intrathoracic injury, ICH   Co morbidities that complicate the patient evaluation  See HPI   Additional history obtained:  Additional history obtained from EMS and Nursing   External records from outside source obtained and reviewed including triage RN note   Lab Tests:  I Ordered, and personally interpreted labs.  The pertinent results include:   WBC 36.1 Lactic 2.85 Hgb 11.9 Creatinine 1.10 BUN 26 Troponin x 1 negative  CBG 190   Imaging Studies ordered:  I ordered imaging studies including CT head, CT cervical spine, CT abdomen pelvis, chest x-ray, pelvis x-ray I independently visualized and interpreted  imaging which showed chololithiasis wo complications I agree with the radiologist interpretation   Cardiac Monitoring:  The patient was maintained on a cardiac monitor.  I personally viewed and interpreted the cardiac monitored which showed an underlying rhythm of: NSR in 65bpm with no T wave nor ST abnormalities   Medicines ordered and prescription drug management:  I ordered medication including IVF, tylenol   for pain  Reevaluation of the patient after these medicines showed that the patient improved I have reviewed the patients home medicines and have made adjustments as needed     Problem List / ED Course:  Fall Head injury Neuro intact with no focal deficits.    Reevaluation:  After the interventions noted above, I reevaluated the patient and found that they have :stayed the same   Social Determinants of Health:  Has PCP   Dispostion:  After consideration of the diagnostic results and the patients response to treatment, I feel that the patent would benefit from ***.    {Document critical care time when appropriate  Document review of labs and clinical decision tools ie [id number], etc  Document your independent review of radiology images and any outside records  Document your discussion with family members, caretakers and with consultants  Document social determinants of health affecting pt's care  Document your decision making why or why not admission, treatments were needed:32947:::1}   Final diagnoses:  None    ED Discharge Orders     None

## 2023-09-28 NOTE — ED Notes (Signed)
 Pt returned from CT

## 2023-09-29 ENCOUNTER — Inpatient Hospital Stay (HOSPITAL_COMMUNITY)

## 2023-09-29 DIAGNOSIS — I951 Orthostatic hypotension: Secondary | ICD-10-CM | POA: Diagnosis present

## 2023-09-29 DIAGNOSIS — Z86711 Personal history of pulmonary embolism: Secondary | ICD-10-CM | POA: Diagnosis not present

## 2023-09-29 DIAGNOSIS — Z86718 Personal history of other venous thrombosis and embolism: Secondary | ICD-10-CM | POA: Diagnosis not present

## 2023-09-29 DIAGNOSIS — E872 Acidosis, unspecified: Secondary | ICD-10-CM | POA: Diagnosis present

## 2023-09-29 DIAGNOSIS — Z794 Long term (current) use of insulin: Secondary | ICD-10-CM | POA: Diagnosis not present

## 2023-09-29 DIAGNOSIS — Y92009 Unspecified place in unspecified non-institutional (private) residence as the place of occurrence of the external cause: Secondary | ICD-10-CM | POA: Diagnosis not present

## 2023-09-29 DIAGNOSIS — G4730 Sleep apnea, unspecified: Secondary | ICD-10-CM | POA: Diagnosis not present

## 2023-09-29 DIAGNOSIS — D72829 Elevated white blood cell count, unspecified: Secondary | ICD-10-CM | POA: Diagnosis not present

## 2023-09-29 DIAGNOSIS — D72820 Lymphocytosis (symptomatic): Secondary | ICD-10-CM | POA: Diagnosis not present

## 2023-09-29 DIAGNOSIS — E1169 Type 2 diabetes mellitus with other specified complication: Secondary | ICD-10-CM | POA: Diagnosis not present

## 2023-09-29 DIAGNOSIS — G4733 Obstructive sleep apnea (adult) (pediatric): Secondary | ICD-10-CM | POA: Diagnosis present

## 2023-09-29 DIAGNOSIS — R55 Syncope and collapse: Secondary | ICD-10-CM

## 2023-09-29 DIAGNOSIS — M329 Systemic lupus erythematosus, unspecified: Secondary | ICD-10-CM | POA: Diagnosis not present

## 2023-09-29 DIAGNOSIS — E669 Obesity, unspecified: Secondary | ICD-10-CM | POA: Diagnosis present

## 2023-09-29 DIAGNOSIS — E11319 Type 2 diabetes mellitus with unspecified diabetic retinopathy without macular edema: Secondary | ICD-10-CM | POA: Diagnosis present

## 2023-09-29 DIAGNOSIS — I341 Nonrheumatic mitral (valve) prolapse: Secondary | ICD-10-CM | POA: Diagnosis present

## 2023-09-29 DIAGNOSIS — K219 Gastro-esophageal reflux disease without esophagitis: Secondary | ICD-10-CM | POA: Diagnosis present

## 2023-09-29 DIAGNOSIS — Z833 Family history of diabetes mellitus: Secondary | ICD-10-CM | POA: Diagnosis not present

## 2023-09-29 DIAGNOSIS — Z91041 Radiographic dye allergy status: Secondary | ICD-10-CM | POA: Diagnosis not present

## 2023-09-29 DIAGNOSIS — Z96653 Presence of artificial knee joint, bilateral: Secondary | ICD-10-CM | POA: Diagnosis present

## 2023-09-29 DIAGNOSIS — Z888 Allergy status to other drugs, medicaments and biological substances status: Secondary | ICD-10-CM | POA: Diagnosis not present

## 2023-09-29 DIAGNOSIS — I10 Essential (primary) hypertension: Secondary | ICD-10-CM | POA: Diagnosis present

## 2023-09-29 DIAGNOSIS — S0990XA Unspecified injury of head, initial encounter: Secondary | ICD-10-CM | POA: Diagnosis present

## 2023-09-29 DIAGNOSIS — W1830XA Fall on same level, unspecified, initial encounter: Secondary | ICD-10-CM | POA: Diagnosis present

## 2023-09-29 DIAGNOSIS — Z7984 Long term (current) use of oral hypoglycemic drugs: Secondary | ICD-10-CM | POA: Diagnosis not present

## 2023-09-29 DIAGNOSIS — Z7901 Long term (current) use of anticoagulants: Secondary | ICD-10-CM | POA: Diagnosis not present

## 2023-09-29 DIAGNOSIS — K8020 Calculus of gallbladder without cholecystitis without obstruction: Secondary | ICD-10-CM | POA: Diagnosis present

## 2023-09-29 DIAGNOSIS — Z79899 Other long term (current) drug therapy: Secondary | ICD-10-CM | POA: Diagnosis not present

## 2023-09-29 LAB — BASIC METABOLIC PANEL WITH GFR
Anion gap: 12 (ref 5–15)
BUN: 17 mg/dL (ref 8–23)
CO2: 21 mmol/L — ABNORMAL LOW (ref 22–32)
Calcium: 8.2 mg/dL — ABNORMAL LOW (ref 8.9–10.3)
Chloride: 102 mmol/L (ref 98–111)
Creatinine, Ser: 0.9 mg/dL (ref 0.44–1.00)
GFR, Estimated: >60 mL/min
Glucose, Bld: 173 mg/dL — ABNORMAL HIGH (ref 70–99)
Potassium: 5 mmol/L (ref 3.5–5.1)
Sodium: 135 mmol/L (ref 135–145)

## 2023-09-29 LAB — ECHOCARDIOGRAM COMPLETE
Area-P 1/2: 4.04 cm2
Calc EF: 59.6 %
Height: 63 in
S' Lateral: 3 cm
Single Plane A2C EF: 61 %
Single Plane A4C EF: 59.1 %
Weight: 3264 [oz_av]

## 2023-09-29 LAB — CBC
HCT: 35.6 % — ABNORMAL LOW (ref 36.0–46.0)
Hemoglobin: 11.2 g/dL — ABNORMAL LOW (ref 12.0–15.0)
MCH: 27.6 pg (ref 26.0–34.0)
MCHC: 31.5 g/dL (ref 30.0–36.0)
MCV: 87.7 fL (ref 80.0–100.0)
Platelets: 292 10*3/uL (ref 150–400)
RBC: 4.06 MIL/uL (ref 3.87–5.11)
RDW: 18 % — ABNORMAL HIGH (ref 11.5–15.5)
WBC: 33.9 10*3/uL — ABNORMAL HIGH (ref 4.0–10.5)
nRBC: 0 % (ref 0.0–0.2)

## 2023-09-29 LAB — CBG MONITORING, ED
Glucose-Capillary: 124 mg/dL — ABNORMAL HIGH (ref 70–99)
Glucose-Capillary: 162 mg/dL — ABNORMAL HIGH (ref 70–99)
Glucose-Capillary: 155 mg/dL — ABNORMAL HIGH (ref 70–99)

## 2023-09-29 LAB — TROPONIN I (HIGH SENSITIVITY): Troponin I (High Sensitivity): 6 ng/L (ref <18)

## 2023-09-29 LAB — CK: Total CK: 2302 U/L — ABNORMAL HIGH (ref 38–234)

## 2023-09-29 LAB — PATHOLOGIST SMEAR REVIEW

## 2023-09-29 LAB — HEMOGLOBIN A1C
Hgb A1c MFr Bld: 8.8 % — ABNORMAL HIGH (ref 4.8–5.6)
Mean Plasma Glucose: 205.86 mg/dL

## 2023-09-29 LAB — PROTIME-INR
INR: 2 — ABNORMAL HIGH (ref 0.8–1.2)
Prothrombin Time: 23.3 s — ABNORMAL HIGH (ref 11.4–15.2)

## 2023-09-29 LAB — I-STAT CG4 LACTIC ACID, ED: Lactic Acid, Venous: 1.9 mmol/L (ref 0.5–1.9)

## 2023-09-29 LAB — TSH: TSH: 1.214 u[IU]/mL (ref 0.350–4.500)

## 2023-09-29 MED ORDER — SODIUM CHLORIDE 0.9 % IV BOLUS
500.0000 mL | Freq: Once | INTRAVENOUS | Status: AC
  Administered 2023-09-29: 500 mL via INTRAVENOUS

## 2023-09-29 MED ORDER — DOCUSATE SODIUM 100 MG PO CAPS
100.0000 mg | ORAL_CAPSULE | Freq: Two times a day (BID) | ORAL | Status: DC
  Administered 2023-09-29 – 2023-09-30 (×3): 100 mg via ORAL
  Filled 2023-09-29 (×3): qty 1

## 2023-09-29 MED ORDER — PREGABALIN 100 MG PO CAPS
100.0000 mg | ORAL_CAPSULE | Freq: Two times a day (BID) | ORAL | Status: DC
  Administered 2023-09-29 – 2023-09-30 (×4): 100 mg via ORAL
  Filled 2023-09-29 (×4): qty 1

## 2023-09-29 MED ORDER — PERFLUTREN LIPID MICROSPHERE
1.0000 mL | INTRAVENOUS | Status: AC | PRN
  Administered 2023-09-29: 5 mL via INTRAVENOUS

## 2023-09-29 MED ORDER — VERAPAMIL HCL ER 180 MG PO TBCR: 180.0000 mg | EXTENDED_RELEASE_TABLET | Freq: Two times a day (BID) | ORAL | Status: DC

## 2023-09-29 MED ORDER — ONDANSETRON HCL 4 MG/2ML IJ SOLN: 4.0000 mg | Freq: Four times a day (QID) | INTRAMUSCULAR | Status: DC | PRN

## 2023-09-29 MED ORDER — ACETAMINOPHEN 500 MG PO TABS
500.0000 mg | ORAL_TABLET | Freq: Four times a day (QID) | ORAL | Status: DC
  Administered 2023-09-29 – 2023-09-30 (×5): 500 mg via ORAL
  Filled 2023-09-29 (×5): qty 1

## 2023-09-29 MED ORDER — MORPHINE SULFATE (PF) 2 MG/ML IV SOLN
2.0000 mg | Freq: Once | INTRAVENOUS | Status: AC
  Administered 2023-09-29: 2 mg via INTRAVENOUS
  Filled 2023-09-29: qty 1

## 2023-09-29 MED ORDER — WARFARIN SODIUM 10 MG PO TABS
10.0000 mg | ORAL_TABLET | Freq: Once | ORAL | Status: AC
  Administered 2023-09-29: 10 mg via ORAL
  Filled 2023-09-29 (×2): qty 1

## 2023-09-29 MED ORDER — SODIUM CHLORIDE 0.9% FLUSH
3.0000 mL | Freq: Two times a day (BID) | INTRAVENOUS | Status: DC
  Administered 2023-09-29 – 2023-09-30 (×3): 3 mL via INTRAVENOUS

## 2023-09-29 MED ORDER — OXYCODONE HCL 5 MG PO TABS
2.5000 mg | ORAL_TABLET | ORAL | Status: DC | PRN
  Administered 2023-09-29 (×2): 2.5 mg via ORAL
  Filled 2023-09-29 (×2): qty 1

## 2023-09-29 MED ORDER — ONDANSETRON HCL 4 MG PO TABS
4.0000 mg | ORAL_TABLET | Freq: Four times a day (QID) | ORAL | Status: DC | PRN
Start: 2023-09-29 — End: 2023-09-30

## 2023-09-29 MED ORDER — SODIUM CHLORIDE 0.9 % IV BOLUS: 1000.0000 mL | Freq: Once | INTRAVENOUS | Status: DC

## 2023-09-29 MED ORDER — INSULIN ASPART 100 UNIT/ML IJ SOLN
0.0000 [IU] | Freq: Three times a day (TID) | INTRAMUSCULAR | Status: DC
  Administered 2023-09-29 (×2): 1 [IU] via SUBCUTANEOUS

## 2023-09-29 MED ORDER — WARFARIN - PHARMACIST DOSING INPATIENT: Freq: Every day | Status: DC

## 2023-09-29 NOTE — ED Notes (Signed)
 Had to override blood glucose monitor, CBG was 124. Nurse was notified.

## 2023-09-29 NOTE — ED Notes (Signed)
 Pt ambulated to bathroom with steady gait.

## 2023-09-29 NOTE — Progress Notes (Signed)
 PHARMACY - ANTICOAGULATION CONSULT NOTE  Pharmacy Consult for Warfarin  Indication: History of DVT  Allergies  Allergen Reactions   Iodine Itching    Had it injected in vein for eye exam (so eye doctor could take pictures) Patient states she had itching of skin that evening and burning of the tops of her feet   Metronidazole Rash    Patient Measurements: Height: 5' 3 (160 cm) Weight: 92.5 kg (204 lb) IBW/kg (Calculated) : 52.4 HEPARIN  DW (KG): 73.6  Vital Signs: Temp: 97.9 F (36.6 C) (07/16 0108) Temp Source: Oral (07/16 0108) BP: 149/65 (07/16 0130) Pulse Rate: 72 (07/16 0130)  Labs: Recent Labs    09/28/23 2113 09/28/23 2159 09/28/23 2348  HGB 11.9* 13.3  --   HCT 38.5 39.0  --   PLT 303  --   --   CREATININE 1.00 1.10*  --   TROPONINIHS 5  --  6    Estimated Creatinine Clearance: 44 mL/min (A) (by C-G formula based on SCr of 1.1 mg/dL (H)).   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 since 1996   Insulin  dependent diabetes mellitus    Mitral valve prolapse    Neuropathy    Osteoarthritis    bilateral knee   Seasonal allergies    Sleep apnea    no CPAP use in several months, per pt.   Systemic lupus erythematosus (HCC)    Trigger finger, left middle finger 01/2017    Assessment: [name] on warfarin PTA for history of DVT presents to the ED with fall. CT head and CT Abdomen negative for bleeding.   No INR yet  Goal of Therapy:  INR 2-3 Monitor platelets by anticoagulation protocol: Yes   Plan:  INR with AM labs to assess dosing needs  Lynwood Mckusick, PharmD, BCPS Clinical Pharmacist Phone: [phone]

## 2023-09-29 NOTE — Progress Notes (Signed)
 PHARMACY - ANTICOAGULATION CONSULT NOTE  Pharmacy Consult for Warfarin  Indication: History of DVT  Allergies  Allergen Reactions   Iodine Itching    Had it injected in vein for eye exam (so eye doctor could take pictures) Patient states she had itching of skin that evening and burning of the tops of her feet   Metronidazole Rash    Patient Measurements: Height: 5' 3 (160 cm) Weight: 92.5 kg (204 lb) IBW/kg (Calculated) : 52.4 HEPARIN  DW (KG): 73.6  Vital Signs: Temp: 98.1 F (36.7 C) (07/16 0950) Temp Source: Oral (07/16 0950) BP: 143/65 (07/16 0908) Pulse Rate: 79 (07/16 0908)  Labs: Recent Labs    09/28/23 2113 09/28/23 2159 09/28/23 2348 09/29/23 0326  HGB 11.9* 13.3  --  11.2*  HCT 38.5 39.0  --  35.6*  PLT 303  --   --  292  LABPROT  --   --   --  23.3*  INR  --   --   --  2.0*  CREATININE 1.00 1.10*  --  0.90  CKTOTAL  --   --   --  2,302*  TROPONINIHS 5  --  6  --     Estimated Creatinine Clearance: 53.8 mL/min (by C-G formula based on SCr of 0.9 mg/dL).   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 since 1996   Insulin  dependent diabetes mellitus    Mitral valve prolapse    Neuropathy    Osteoarthritis    bilateral knee   Seasonal allergies    Sleep apnea    no CPAP use in several months, per pt.   Systemic lupus erythematosus (HCC)    Trigger finger, left middle finger 01/2017    Assessment: [name] on warfarin PTA for history of DVT presents to the ED with fall. CT head and CT Abdomen negative for bleeding. INR 2.0, last dose 7/14. Reported pta  warfarin regimen is 10mg  every day,   Goal of Therapy:  INR 2-3 Monitor platelets by anticoagulation protocol: Yes   Plan:  Warfarin  10mg  po x1  Daily INR and CBC Monitor for s/sx of bleeding  Koren Or, PharmD Clinical Pharmacist 09/29/2023 10:17 AM Please check AMION for all [facility] Pharmacy numbers

## 2023-09-29 NOTE — ED Notes (Signed)
 CCMD called.

## 2023-09-29 NOTE — ED Notes (Signed)
 Phlebotomy asked to stick for labs.

## 2023-09-29 NOTE — ED Provider Notes (Signed)
  Physical Exam  BP (!) 141/75   Pulse 71   Temp 97.6 F (36.4 C) (Oral)   Resp 17   Ht 5' 3 (1.6 m)   Wt 92.5 kg   SpO2 100%   BMI 36.14 kg/m   Physical Exam  Procedures  Procedures  ED Course / MDM    Medical Decision Making Amount and/or Complexity of Data Reviewed Labs: ordered. Radiology: ordered.  Risk OTC drugs. Prescription drug management. Decision regarding hospitalization.   Patient care assumed at shift handoff from previous provider.  Plan to discuss with hospitalist for admission due to positive SIRS criteria with tachypnea, leukocytosis, elevated lactic acid with no known source of infection.  I discussed the patient with the hospitalist, Dr. Arthea, who agrees to see the patient for admission.     Kaitlyn Jackson 09/29/23 0037    Carita Senior, MD 09/29/23 [phone]

## 2023-09-29 NOTE — H&P (Addendum)
 History and Physical    [name] FMW:[id number] DOB: 17-Aug-1945 DOA: 09/28/2023 DOS: the patient was seen and examined on 09/29/2023 PCP: Benjamine Aland, MD  Patient coming from: Home  Chief Complaint:  Chief Complaint  Patient presents with   Fall   HPI: Kaitlyn Jackson is a 78 y.o. female with medical history significant fro Lupus, OSA, DMT2,  And H/o DVT on Coumadin  who presents after falling in her house on the way to the bedroom.  She felt fine prior to the fall she was with her great granddaughter and the next thing she knows she was on her on the floor.  She does not remember feeling lightheaded or dizzy.  She does not remember tripping she does not really remember falling she just remembers that she knocked her head and heard a thump.  She was very sore after the fall and was not able to get up.  She was able to scoot closer to her bed but she could even pull herself up onto the bed.  She hit her head pretty hard and the right side of her head and neck and scapular were all hurting pretty badly.  She is on Coumadin  and she knew that she needed to come in to be evaluated because of the head trauma on Coumadin .   In the emergency department the patient was evaluated and had multiple scans including a CT scan of her head and neck.  No evidence of bleeding or significant injury was discovered.  Patient continues to be extremely sore but is glad no significant injury was found.  Blood work incidentally however did reveal a white blood cell count of 36.  The patient denies any fevers or chills or diarrhea.  She has had trouble with a cough over the last couple weeks but no shortness of breath no other URI symptoms.  The patient was started on Ozempic about 3 weeks ago she was doing very low doses until about a week ago when she started a regular dose.  She has been having low blood sugars at night she wears a blood sugar monitor in her arm.  Her doctor decreased her evening  insulin  dose from 14 units to 7 units 4 days ago but she is continue to have low blood sugars at night, mainly in the 50s and 60s   Review of Systems: As mentioned in the history of present illness. All other systems reviewed and are negative. 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 since 1996   Insulin  dependent diabetes mellitus    Mitral valve prolapse    Neuropathy    Osteoarthritis    bilateral knee   Seasonal allergies    Sleep apnea    no CPAP use in several months, per pt.   Systemic lupus erythematosus (HCC)    Trigger finger, left middle finger 01/2017   Past Surgical History:  Procedure Laterality Date   BACK SURGERY     lower - removed  cysts  CATARACT EXTRACTION W/ INTRAOCULAR LENS  IMPLANT, BILATERAL Bilateral    GLAUCOMA SURGERY Bilateral    laser   HEMILAMINOTOMY LUMBAR SPINE Right 01/30/2009   L5   JOINT REPLACEMENT     TOTAL KNEE ARTHROPLASTY Right 09/22/2007   TOTAL KNEE ARTHROPLASTY Left 02/01/2007   TOTAL KNEE REVISION Right 01/31/2019   Procedure: RIGHT TOTAL KNEE REVISION;  Surgeon: Anderson Maude ORN, MD;  Location: [facility];  Service: Orthopedics;  Laterality: Right;   TRIGGER FINGER RELEASE Right 12/14/2013   Procedure: RELEASE TRIGGER FINGER/A-1 PULLEY RIGHT RING FINGER;  Surgeon: Arley Curia, MD;  Location: [facility];  Service: Orthopedics;  Laterality: Right;   TRIGGER FINGER RELEASE Left 02/11/2017   Procedure: RELEASE TRIGGER FINGER/A-1 PULLEY;  Surgeon: Curia Drivers, MD;  Location: [facility];  Service: Orthopedics;  Laterality: Left;    TRIGGER FINGER RELEASE Right 11/19/2022   Procedure: RELEASE TRIGGER FINGER/A-1 PULLEY RIGHT INDEX FINGER;  Surgeon: Curia Drivers, MD;  Location: [facility];  Service: Orthopedics;  Laterality: Right;  30 MIN   Social History:  reports that she has never smoked. She has never used smokeless tobacco. She reports that she does not drink alcohol and does not use drugs.  Allergies  Allergen Reactions   Iodine Itching    Had it injected in vein for eye exam (so eye doctor could take pictures) Patient states she had itching of skin that evening and burning of the tops of her feet   Metronidazole Rash    Family History  Problem Relation Age of Onset   Cancer Mother        colon   Cancer Cousin        lung   Diabetes Sister    Diabetes Sister    Diabetes Sister     Prior to Admission medications   Medication Sig Start Date End Date Taking? Authorizing Provider  ACCU-CHEK AVIVA PLUS test strip CHECK BLOOD GLUCOSE TWICE A DAY 10/04/17   [provider]  acetaminophen -codeine  (TYLENOL  #3) 300-30 MG tablet Take 1 tablet by mouth every 6 (six) hours as needed for moderate pain. 11/19/22   Kuzma, Kevin, MD  atorvastatin  (LIPITOR) 20 MG tablet Take 10 mg by mouth at bedtime.    [provider]  azelastine  (ASTELIN ) 0.1 % nasal spray azelastine  137 mcg (0.1 %) nasal spray aerosol  PLACE 2 SPRAYS INTO BOTH NOSTRILS 2 (TWO) TIMES DAILY.    [provider]  benazepril  (LOTENSIN ) 40 MG tablet Take 40 mg by mouth daily.     [provider]  BESIVANCE 0.6 % SUSP Place 1 drop into the right eye See admin instructions. Instill one drop into right eye 4 times daily for 2 days after each monthly eye injection. 10/03/18   [provider]  Blood Glucose Monitoring Suppl (GLUCOCOM BLOOD GLUCOSE MONITOR) DEVI Accu-Chek Aviva Plus Meter    [provider]  Calcitriol-Fluticas-Tarcrolim (TRIDERMA FORTE EX) Apply 1 application topically 3 (three) times  daily as needed (for pain).    [provider]  cholecalciferol  (VITAMIN D ) 1000 UNITS tablet Take 1,000 Units by mouth at bedtime.     [provider]  Cholecalciferol  25 MCG (1000 UT) capsule Vitamin D3 25 mcg (1,000 unit) capsule  Take by oral route.    [provider]  esomeprazole (NEXIUM) 40 MG capsule Take 40 mg by mouth daily before breakfast.    [provider]  FARXIGA 10 MG TABS tablet Take 10 mg by  mouth daily. 04/20/22   [provider]  fluticasone  (FLONASE ) 50 MCG/ACT nasal spray Place 1 spray into both nostrils daily as needed for allergies.     [provider]  furosemide  (LASIX ) 40 MG tablet Take 40 mg by mouth daily.    [provider]  HUMALOG MIX 75/25 KWIKPEN (75-25) 100 UNIT/ML Kwikpen Inject 44 Units into the skin daily. 08/26/18   [provider]  hydrocortisone 2.5 % lotion Apply topically. 01/09/20   [provider]  hydrOXYzine  (ATARAX /VISTARIL ) 25 MG tablet Take 25 mg by mouth daily. 01/04/20   [provider]  insulin  aspart (NOVOLOG ) 100 UNIT/ML injection Inject into the skin. 07/17/16   [provider]  Insulin  Pen Needle (NOVOFINE) 30G X 8 MM MISC NovoFine 30 30 gauge x 1/3 needle    [provider]  Insulin  Pen Needle 31G X 8 MM MISC BD Ultra-Fine Short Pen Needle 31 gauge x 5/16    [provider]  Insulin  Pen Needle 32G X 4 MM MISC NovoFine Plus 32 gauge x 1/6 needle  USE 2 TIMES DAILY    [provider]  Lancet Devices ([employer] LANCING DEVICE) MISC Accu-Chek FastClix Lancing Device    [provider]  Lancets Misc. (ACCU-CHEK FASTCLIX LANCET) KIT Accu-Chek FastClix Lancing Device    [provider]  metFORMIN (GLUCOPHAGE) 1000 MG tablet Take 1,000 mg by mouth at bedtime. 09/10/17   [provider]  montelukast  (SINGULAIR ) 10 MG tablet Take 10 mg by mouth at bedtime.    [provider]  pregabalin  (LYRICA )  100 MG capsule Take 100 mg by mouth 2 (two) times daily.    [provider]  Semaglutide (RYBELSUS) 14 MG TABS Take by mouth.    [provider]  sitaGLIPtin-metformin (JANUMET) 50-1000 MG tablet Take 1 tablet by mouth every morning.     [provider]  verapamil  (CALAN -SR) 180 MG CR tablet Take 180 mg by mouth 2 (two) times daily. 01/09/19   [provider]  vitamin C  (ASCORBIC ACID ) 500 MG tablet Take 500 mg by mouth daily.     [provider]  warfarin (COUMADIN ) 10 MG tablet Take 10 mg by mouth See admin instructions. Take 1 tablet by mouth every other day in the evening, alternating with 7.5mg  tablet 10/24/16   [provider]  warfarin (COUMADIN ) 7.5 MG tablet Take 7.5 mg by mouth See admin instructions. Take 1 tablet by mouth every other day in the evening, alternating with 10mg  tablet 10/24/16   [provider]    Physical Exam: Vitals:   09/28/23 2130 09/28/23 2200 09/28/23 2230 09/28/23 2300  BP:  (!) 143/67 (!) 153/67 (!) 141/75  Pulse: 65 67 70 71  Resp: 20 16 17 17   Temp:      TempSrc:      SpO2: 99% 100% 100% 100%  Weight:      Height:       Physical Exam:  General: No acute distress, well developed, well nourished HEENT: Normocephalic, atraumatic, PERRL Cardiovascular: Normal rate and rhythm. SM. Distal pulses intact. Pulmonary: Normal pulmonary effort, normal breath sounds Gastrointestinal: Nondistended abdomen, soft, tender in ruq, normoactive bowel sounds Musculoskeletal:Normal ROM, no lower ext edema Skin: Skin is warm and dry. Neuro: moves all ext.  AAOx3. PSYCH: Attentive and cooperative  Data Reviewed:  Results for orders placed or performed during the hospital encounter of 09/28/23 (from the past 24 hours)  CBC with Differential     Status: Abnormal  Collection Time: 09/28/23  9:13 PM  Result Value Ref Range   WBC 36.1 (H) 4.0 - 10.5 K/uL   RBC 4.31 3.87 - 5.11 MIL/uL   Hemoglobin 11.9 (L)  12.0 - 15.0 g/dL   HCT 61.4 63.9 - 53.9 %   MCV 89.3 80.0 - 100.0 fL   MCH 27.6 26.0 - 34.0 pg   MCHC 30.9 30.0 - 36.0 g/dL   RDW 82.0 (H) 88.4 - 84.4 %   Platelets 303 150 - 400 K/uL   nRBC 0.0 0.0 - 0.2 %   Neutrophils Relative % 16 %   Neutro Abs 5.8 1.7 - 7.7 K/uL   Lymphocytes Relative 64 %   Lymphs Abs 23.3 (H) 0.7 - 4.0 K/uL   Monocytes Relative 19 %   Monocytes Absolute 6.7 (H) 0.1 - 1.0 K/uL   Eosinophils Relative 1 %   Eosinophils Absolute 0.2 0.0 - 0.5 K/uL   Basophils Relative 0 %   Basophils Absolute 0.1 0.0 - 0.1 K/uL   WBC Morphology ABSOLUTE LYMPHOCYTOSIS    RBC Morphology MORPHOLOGY UNREMARKABLE    Smear Review Normal platelet morphology    Immature Granulocytes 0 %   Abs Immature Granulocytes 0.09 (H) 0.00 - 0.07 K/uL   Smudge Cells PRESENT   Comprehensive metabolic panel     Status: Abnormal   Collection Time: 09/28/23  9:13 PM  Result Value Ref Range   Sodium 136 135 - 145 mmol/L   Potassium 5.0 3.5 - 5.1 mmol/L   Chloride 104 98 - 111 mmol/L   CO2 24 22 - 32 mmol/L   Glucose, Bld 190 (H) 70 - 99 mg/dL   BUN 22 8 - 23 mg/dL   Creatinine, Ser 8.99 0.44 - 1.00 mg/dL   Calcium  8.5 (L) 8.9 - 10.3 mg/dL   Total Protein 6.3 (L) 6.5 - 8.1 g/dL   Albumin 3.3 (L) 3.5 - 5.0 g/dL   AST 28 15 - 41 U/L   ALT 16 0 - 44 U/L   Alkaline Phosphatase 91 38 - 126 U/L   Total Bilirubin 0.5 0.0 - 1.2 mg/dL   GFR, Estimated 57 (L) >60 mL/min   Anion gap 8 5 - 15  Lipase, blood     Status: None   Collection Time: 09/28/23  9:13 PM  Result Value Ref Range   Lipase 37 11 - 51 U/L  Troponin I (High Sensitivity)     Status: None   Collection Time: 09/28/23  9:13 PM  Result Value Ref Range   Troponin I (High Sensitivity) 5 <18 ng/L  I-stat chem 8, ED (not at [facility], [facility] or [facility])     Status: Abnormal   Collection Time: 09/28/23  9:59 PM  Result Value Ref Range   Sodium 137 135 - 145 mmol/L   Potassium 5.1 3.5 - 5.1 mmol/L   Chloride 103 98 - 111 mmol/L   BUN 26 (H) 8 - 23  mg/dL   Creatinine, Ser 8.89 (H) 0.44 - 1.00 mg/dL   Glucose, Bld 813 (H) 70 - 99 mg/dL   Calcium , Ion 1.07 (L) 1.15 - 1.40 mmol/L   TCO2 24 22 - 32 mmol/L   Hemoglobin 13.3 12.0 - 15.0 g/dL   HCT 60.9 63.9 - 53.9 %  I-Stat CG4 Lactic Acid     Status: Abnormal   Collection Time: 09/28/23  9:59 PM  Result Value Ref Range   Lactic Acid, Venous 2.9 (HH) 0.5 - 1.9 mmol/L   Comment NOTIFIED PHYSICIAN  Urinalysis, Routine w reflex microscopic -Urine, Clean Catch     Status: Abnormal   Collection Time: 09/28/23 10:50 PM  Result Value Ref Range   Color, Urine STRAW (A) YELLOW   APPearance CLEAR CLEAR   Specific Gravity, Urine 1.016 1.005 - 1.030   pH 6.0 5.0 - 8.0   Glucose, UA >=500 (A) NEGATIVE mg/dL   Hgb urine dipstick MODERATE (A) NEGATIVE   Bilirubin Urine NEGATIVE NEGATIVE   Ketones, ur NEGATIVE NEGATIVE mg/dL   Protein, ur NEGATIVE NEGATIVE mg/dL   Nitrite NEGATIVE NEGATIVE   Leukocytes,Ua TRACE (A) NEGATIVE   RBC / HPF 0-5 0 - 5 RBC/hpf   WBC, UA 0-5 0 - 5 WBC/hpf   Bacteria, UA RARE (A) NONE SEEN   Squamous Epithelial / HPF 0-5 0 - 5 /HPF   Mucus PRESENT   Troponin I (High Sensitivity)     Status: None   Collection Time: 09/28/23 11:48 PM  Result Value Ref Range   Troponin I (High Sensitivity) 6 <18 ng/L  I-Stat CG4 Lactic Acid     Status: None   Collection Time: 09/28/23 11:56 PM  Result Value Ref Range   Lactic Acid, Venous 1.9 0.5 - 1.9 mmol/L     Assessment and Plan: Elevated wbc of 36 - No evidence of infection was found on her scans.  Clinically she had a cough but no sign of significant infection.  -Consider hematology consult in a.m. if this elevated white blood cell count persists.  The last blood cell count to the computer was 2 years ago and that was 11.8  2. S/p syncope with trauma - Patient has pain in her abdomen and back and the right side of her head and neck and right shoulder.  CT of the head, C-spine, and abdomen pelvis do not show any significant  injury. - PT - expect bruising - For Pain control scheduled tylenol  with oxy for breakthrough - Echo - telemetry  3. Lupus - No current medication or symptoms noted  4. DMT2 -the patient has recently started therapeutic dose of Ozempic and is having trouble with low blood sugars.  Her blood sugar on arrival in the emergency department was 130 so I do not think that has anything to do with her fall.  But sounds like her nighttime diabetes patients need to be cut back now that her Ozempic is started because of the nighttime hypoglycemia that she is having.  She is currently on twice daily insulin , twice daily metformin, Farxiga, Ozempic and Januvia (vs Janumet) - The patient would like to stop her 1000 mg of metformin nightly. Will hold Metformin due to CT. But at discharge she can discontinue it. Monitor blood sugars while hospitalized.  The patient missed all of her nighttime medications tonight while in the ER. -Monitor sugars and add corrective dose insulin .  5. H/o PE - check INR - Pharmacy to dose Coumadin      Advance Care Planning:   Code Status: Prior the patient names her daughter Apolinar as her surrogate decision maker and wants to be full code.  Consults: None  Family Communication: None  Severity of Illness: The appropriate patient status for this patient is INPATIENT. Inpatient status is judged to be reasonable and necessary in order to provide the required intensity of service to ensure the patient's safety. The patient's presenting symptoms, physical exam findings, and initial radiographic and laboratory data in the context of their chronic comorbidities is felt to place them at high risk for  further clinical deterioration. Furthermore, it is not anticipated that the patient will be medically stable for discharge from the hospital within 2 midnights of admission.   * I certify that at the point of admission it is my clinical judgment that the patient will require inpatient  hospital care spanning beyond 2 midnights from the point of admission due to high intensity of service, high risk for further deterioration and high frequency of surveillance required.*  Author: ARTHEA CHILD, MD 09/29/2023 1:05 AM  For on call review [email or web address].

## 2023-09-29 NOTE — Progress Notes (Signed)
   09/29/23 1922  BiPAP/CPAP/SIPAP  Reason BIPAP/CPAP not in use Non-compliant (Pt states she does not wish to use CPAP tonight. RT informed the patient if she changes her mind, we will bring her a CPAP to wear.)

## 2023-09-29 NOTE — Evaluation (Signed)
 Physical Therapy Brief Evaluation and Discharge Note Patient Details Name: Kaitlyn Jackson MRN: [id number] DOB: 03-02-1946 Today's Date: 09/29/2023   History of Present Illness  Pt is 78 yo presenting to [facility] on 7/15 due to Neck pain, R hip pain, chest pain, abdominal pain following a fall with head involvement. PMH: DM II, HTN, lupus, bil TKR, OSA, DVT, peripheral neuropathy.  Clinical Impression  Pt is presenting slightly below baseline level of care. Pt currently has some soreness in her R ankle that is slowing down her gait but pt is safe with all movements. Pt was CGA to Min A for bed mobility, Mod I for sit to stand and gait without an AD. Due to pt current functional status, home set up and available assistance at home recommending skilled physical therapy services 3x/week in order to address strength, balance and functional mobility to decrease risk for falls, injury and re-hospitalization.  Pt will be discharged from acute care physical therapy at this time and all needs can be met in next venue of care due to pt is close to baseline/Mod I.         PT Assessment All further PT needs can be met in the next venue of care  Assistance Needed at Discharge  PRN    Equipment Recommendations None recommended by PT     Precautions/Restrictions Precautions Precautions: Fall Recall of Precautions/Restrictions: Intact Restrictions Weight Bearing Restrictions Per Provider Order: No        Mobility  Bed Mobility Rolling: Modified independent (Device/Increase time) Supine/Sidelying to sit: Modified independent (Device/Increased time) Sit to supine/sidelying: Contact guard assist General bed mobility comments: assist with hand placement due to pt is on stretcher. CGA to get legs back into the stretcher  Transfers Overall transfer level: Modified independent Equipment used: None     General transfer comment: from stretcher and toilet.    Ambulation/Gait Ambulation/Gait  assistance: Modified independent (Device/Increase time) Gait Distance (Feet): 150 Feet Assistive device: None Gait Pattern/deviations: Step-through pattern, Decreased stride length, Decreased stance time - right, Antalgic Gait Speed: Below normal General Gait Details: pt R ankle is sore  Home Activity Instructions Home Activity Instructions: continue to move in order to decrease stiffness from fall.  Stairs Stairs:  (demonstrates good strength with sit to stand and gait to perform stairs per home set up)            Balance Overall balance assessment: Modified Independent, Mild deficits observed, not formally tested   Sitting balance-Leahy Scale: Good       Standing balance-Leahy Scale: Fair Standing balance comment: no overt LOB          Pertinent Vitals/Pain   Pain Assessment Pain Assessment: Faces Faces Pain Scale: Hurts a little bit Pain Location: generalized Pain Descriptors / Indicators: Aching, Discomfort, Grimacing Pain Intervention(s): Monitored during session     Home Living Family/patient expects to be discharged to:: Private residence Living Arrangements: Other relatives (3 yo great granddaughter who she cares for.) Available Help at Discharge: Family;Available PRN/intermittently (grandson and daughter) Home Environment: Stairs to enter;Rail - right;Rail - left  Stairs-Number of Steps: 4 and then 4 Home Equipment: Shower seat;Grab bars - tub/shower;Rollator (4 wheels);Cane - single point        Prior Function Level of Independence: Independent with assistive device(s) Comments: rollator occasionally in the house when standing for long periods of time due to back pain    UE/LE Assessment   UE ROM/Strength/Tone/Coordination: [facility]    LE ROM/Strength/Tone/Coordination: [facility]  Communication   Communication Communication: No apparent difficulties     Cognition Overall Cognitive Status: Appears within functional limits for tasks  assessed/performed              Assessment/Plan    PT Problem List Decreased balance;Decreased mobility;Pain       PT Visit Diagnosis Unsteadiness on feet (R26.81);Other abnormalities of gait and mobility (R26.89);Pain      AMPAC 6 Clicks Help needed turning from your back to your side while in a flat bed without using bedrails?: A Little Help needed moving from lying on your back to sitting on the side of a flat bed without using bedrails?: A Little Help needed moving to and from a bed to a chair (including a wheelchair)?: A Little Help needed standing up from a chair using your arms (e.g., wheelchair or bedside chair)?: None Help needed to walk in hospital room?: None Help needed climbing 3-5 steps with a railing? : A Little 6 Click Score: 20      End of Session Equipment Utilized During Treatment: Gait belt Activity Tolerance: Patient tolerated treatment well Patient left: in bed;with call bell/phone within reach Nurse Communication: Mobility status PT Visit Diagnosis: Unsteadiness on feet (R26.81);Other abnormalities of gait and mobility (R26.89);Pain Pain - Right/Left: Right Pain - part of body: Shoulder;Arm;Ankle and joints of foot     Time: 8561-8494 PT Time Calculation (min) (ACUTE ONLY): 27 min  Charges:   PT Evaluation $PT Eval Low Complexity: 1 Low PT Treatments $Therapeutic Activity: 8-22 mins    Dorothyann Maier, DPT, CLT  Acute Rehabilitation Services Office: [phone] (Secure chat preferred)   Dorothyann VEAR Maier  09/29/2023, 3:19 PM

## 2023-09-29 NOTE — Progress Notes (Signed)
 No charge note  Patient seen and examined this morning, H&P reviewed and I agree with the assessment and plan.  78 year old female with lupus not on any treatment, OSA, DM 2, DVT on Coumadin  who comes into the hospital after a syncopal episode.  This was sudden, does not remember feeling lightheaded or dizzy, no chest pain or palpitations, but does remember falling.  She is not sure whether she tripped or not.  She hit her head pretty hard and decided to come to the emergency room since she is on Coumadin .  Scans in the ER without acute findings.  Incidentally, she was found to have significant leukocytosis.  She is completely asymptomatic, afebrile without any signs or concerns for infectious process.  Imaging did not reveal any pneumonia, intra-abdominal acute findings, etc. Case was discussed with hematology, Dr. 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  Will have patient work with PT, 2D echo pending, anticipate discharge home tomorrow assuming she remains afebrile and her leukocytosis is not proceeding something more ominous  Julyanna Scholle M. Trixie, MD, PhD [facility]  Between 7 am - 7 pm you can contact me via Amion (for emergencies) or Securechat (non urgent matters).  I am not available 7 pm - 7 am, please contact night coverage MD/APP via Amion

## 2023-09-30 DIAGNOSIS — D72829 Elevated white blood cell count, unspecified: Secondary | ICD-10-CM | POA: Diagnosis not present

## 2023-09-30 LAB — COMPREHENSIVE METABOLIC PANEL WITH GFR
ALT: 24 U/L (ref 0–44)
AST: 52 U/L — ABNORMAL HIGH (ref 15–41)
Albumin: 2.8 g/dL — ABNORMAL LOW (ref 3.5–5.0)
Alkaline Phosphatase: 80 U/L (ref 38–126)
Anion gap: 8 (ref 5–15)
BUN: 13 mg/dL (ref 8–23)
CO2: 26 mmol/L (ref 22–32)
Calcium: 8.8 mg/dL — ABNORMAL LOW (ref 8.9–10.3)
Chloride: 103 mmol/L (ref 98–111)
Creatinine, Ser: 0.93 mg/dL (ref 0.44–1.00)
GFR, Estimated: >60 mL/min (ref >60)
Glucose, Bld: 149 mg/dL — ABNORMAL HIGH (ref 70–99)
Potassium: 5 mmol/L (ref 3.5–5.1)
Sodium: 137 mmol/L (ref 135–145)
Total Bilirubin: 0.9 mg/dL (ref 0.0–1.2)
Total Protein: 5.9 g/dL — ABNORMAL LOW (ref 6.5–8.1)

## 2023-09-30 LAB — CBC WITH DIFFERENTIAL/PLATELET
Abs Immature Granulocytes: 0.09 K/uL — ABNORMAL HIGH (ref 0.00–0.07)
Basophils Absolute: 0.4 K/uL — ABNORMAL HIGH (ref 0.0–0.1)
Basophils Relative: 1 %
Eosinophils Absolute: 0 K/uL (ref 0.0–0.5)
Eosinophils Relative: 0 %
HCT: 36.5 % (ref 36.0–46.0)
Hemoglobin: 11.4 g/dL — ABNORMAL LOW (ref 12.0–15.0)
Immature Granulocytes: 0 %
Lymphocytes Relative: 56 %
Lymphs Abs: 24.1 K/uL — ABNORMAL HIGH (ref 0.7–4.0)
MCH: 27.5 pg (ref 26.0–34.0)
MCHC: 31.2 g/dL (ref 30.0–36.0)
MCV: 88.2 fL (ref 80.0–100.0)
Monocytes Absolute: 5.2 K/uL — ABNORMAL HIGH (ref 0.1–1.0)
Monocytes Relative: 12 %
Neutro Abs: 13.3 K/uL — ABNORMAL HIGH (ref 1.7–7.7)
Neutrophils Relative %: 31 %
Platelets: 293 K/uL (ref 150–400)
RBC: 4.14 MIL/uL (ref 3.87–5.11)
RDW: 18.1 % — ABNORMAL HIGH (ref 11.5–15.5)
WBC: 43 K/uL — ABNORMAL HIGH (ref 4.0–10.5)
nRBC: 0 % (ref 0.0–0.2)
nRBC: 0 /100{WBCs}

## 2023-09-30 LAB — CBG MONITORING, ED
Glucose-Capillary: 146 mg/dL — ABNORMAL HIGH (ref 70–99)
Glucose-Capillary: 140 mg/dL — ABNORMAL HIGH (ref 70–99)

## 2023-09-30 LAB — SURGICAL PATHOLOGY

## 2023-09-30 LAB — CK: Total CK: 1195 U/L — ABNORMAL HIGH (ref 38–234)

## 2023-09-30 MED ORDER — OXYCODONE HCL 5 MG PO TABS: 2.5000 mg | ORAL_TABLET | Freq: Four times a day (QID) | ORAL | 0 refills | Status: DC | PRN

## 2023-09-30 NOTE — Discharge Summary (Signed)
 Physician Discharge Summary  Kaitlyn Jackson FMW:[id number] DOB: 04/06/1945 DOA: 09/28/2023  PCP: Benjamine Aland, MD  Admit date: 09/28/2023 Discharge date: 09/30/2023  Admitted From: home Disposition:  home  Recommendations for Outpatient Follow-up:  Follow up with PCP in 1-2 weeks Please obtain BMP/CBC in one week Follow up with hematology as an outpatient  Home Health: PT Equipment/Devices: none  Discharge Condition: stable CODE STATUS: Full code Diet Orders (From admission, onward)     Start     Ordered   09/29/23 0154  Diet Carb Modified Fluid consistency: Thin; Room service appropriate? Yes  Diet effective now       Question Answer Comment  Diet-HS Snack? Snack-yes   Calorie Level Medium 1600-2000   Fluid consistency: Thin   Room service appropriate? Yes      09/29/23 0155            HPI: Per admitting MD, Kaitlyn Jackson is a 78 y.o. female with medical history significant fro Lupus, OSA, DMT2,  And H/o DVT on Coumadin  who presents after falling in her house on the way to the bedroom.  She felt fine prior to the fall she was with her great granddaughter and the next thing she knows she was on her on the floor.  She does not remember feeling lightheaded or dizzy.  She does not remember tripping she does not really remember falling she just remembers that she knocked her head and heard a thump.  She was very sore after the fall and was not able to get up.  She was able to scoot closer to her bed but she could even pull herself up onto the bed.  She hit her head pretty hard and the right side of her head and neck and scapular were all hurting pretty badly.  She is on Coumadin  and she knew that she needed to come in to be evaluated because of the head trauma on Coumadin .    Hospital Course / Discharge diagnoses: Principal Problem:   Leukocytosis Active Problems:   Type 2 diabetes mellitus with obesity (HCC)   Sleep apnea   Systemic lupus erythematosus (HCC)    Fall at home, initial encounter   Syncope and collapse  Principal problem Fall -patient was admitted to the hospital with a ground-level fall while being at home and ambulating.  She denies syncope or passing out that she remembers hitting the wall and then the floor.  Underwent CT of the head, spine, abdomen pelvis which did not show any significant injury.  She was monitored on telemetry without acute events.  2D echocardiogram showed normal LVEF, no wall motion abnormalities, RV systolic function was normal.  She was able to work with physical therapy, feeling back to baseline, and will be discharged home in stable condition with home health PT  Active problems Leukocytosis-patient was found to have isolated elevated WBC around 30-40 K.  She is asymptomatic with this, afebrile and otherwise this seems to be an incidental finding.  Case was discussed with Dr. 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  This can be worked up as an outpatient, she was advised to follow-up with hematology within the next 1 to 2 weeks, and meanwhile a flow cytometry was sent, and this should be resulted by the time she is seen DM 2-with some concern for hypoglycemia, recommend perhaps returning to the initial Ozempic dose. Essential hypertension-she has been normotensive off of her blood pressure medications, cannot really rule out  orthostasis when she had the fall.  Would recommend holding benazepril  for now and follow-up with PCP in a week for blood pressure recheck.  Apparently she was not taking verapamil  already History of PE-continue Coumadin .  No bleeding as above History of lupus-on no current medications or symptoms noted.  Outpatient follow-up  Sepsis ruled out   Discharge Instructions  Discharge Instructions     Ambulatory referral to Hematology / Oncology   Complete by: As directed       Allergies as of 09/30/2023       Reactions   Flagyl [metronidazole] Rash   Iodine  Itching, Other (See Comments)   Burning sensation on tops of feet Reaction to contrast dye, eye injection        Medication List     STOP taking these medications    benazepril  40 MG tablet Commonly known as: LOTENSIN    metFORMIN 1000 MG tablet Commonly known as: GLUCOPHAGE   verapamil  180 MG CR tablet Commonly known as: CALAN -SR       TAKE these medications    acetaminophen  500 MG tablet Commonly known as: TYLENOL  Take 1,500 mg by mouth 2 (two) times daily as needed (back pain).   atorvastatin  20 MG tablet Commonly known as: LIPITOR Take 20 mg by mouth at bedtime.   Besivance 0.6 % Susp Generic drug: Besifloxacin HCl Place 1 drop into the right eye See admin instructions. Instill one drop into right eye 4 times daily for 2 days after each monthly eye injection.   calcium  carbonate 750 MG chewable tablet Commonly known as: TUMS EX Chew 750-1,500 mg by mouth 2 (two) times daily as needed for heartburn.   Farxiga 10 MG Tabs tablet Generic drug: dapagliflozin propanediol Take 10 mg by mouth daily.   furosemide  40 MG tablet Commonly known as: LASIX  Take 20 mg by mouth daily.   HumaLOG Mix 75/25 KwikPen (75-25) 100 UNIT/ML KwikPen Generic drug: Insulin  Lispro Prot & Lispro Inject 7-25 Units into the skin See admin instructions. Inject 25 units subcutaneously every morning and inject 7 units in the evening as needed for elevated blood sugar.   montelukast  10 MG tablet Commonly known as: SINGULAIR  Take 10 mg by mouth at bedtime.   oxyCODONE  5 MG immediate release tablet Commonly known as: Oxy IR/ROXICODONE  Take 0.5 tablets (2.5 mg total) by mouth every 6 (six) hours as needed for breakthrough pain.   Ozempic (2 MG/DOSE) 8 MG/3ML Sopn Generic drug: Semaglutide (2 MG/DOSE) Inject 2 mg into the skin every Wednesday.   pregabalin  100 MG capsule Commonly known as: LYRICA  Take 100 mg by mouth 2 (two) times daily.   sitaGLIPtin-metformin 50-1000 MG  tablet Commonly known as: JANUMET Take 1 tablet by mouth daily.   VITAMIN D -3 PO Take 1 capsule by mouth at bedtime.   warfarin 10 MG tablet Commonly known as: COUMADIN  Take 10 mg by mouth every evening.   Zantac 360 Max St 20 MG tablet Generic drug: famotidine Take 20 mg by mouth 2 (two) times daily as needed for heartburn or indigestion.        Follow-up Information     [facility] Dept Of [place]. [facility]. Call in 1 day(s).   Specialty: Oncology Why: to make an appointment in 1-2 weeks Contact information: [address] [place]  [address] [phone]                Consultations: none  Procedures/Studies:  ECHOCARDIOGRAM COMPLETE Result Date: 09/29/2023    ECHOCARDIOGRAM REPORT   Patient Name:   Kaitlyn Jackson Date of Exam: 09/29/2023 Medical Rec #:  [id number]          Height:       63.0 in Accession #:    [id number]         Weight:       204.0 lb Date of Birth:  [birth year]          BSA:          1.950 m Patient Age:    78 years           BP:           149/68 mmHg Patient Gender: F                  HR:           80 bpm. Exam Location:  Inpatient Procedure: 2D Echo, Cardiac Doppler, Color Doppler and Intracardiac            Opacification Agent (Both Spectral and Color Flow Doppler were            utilized during procedure). Indications:    Syncope  History:        Patient has no prior history of Echocardiogram examinations.                 Risk Factors:Diabetes and Sleep Apnea.  Sonographer:    Therisa Crouch Referring Phys: [phone] CLAUDIA CLAIBORNE IMPRESSIONS  1. Left ventricular ejection fraction, by estimation, is 55 to 60%. The left ventricle has normal function. The left ventricle has no regional wall motion abnormalities. There is mild concentric left ventricular hypertrophy. Left ventricular diastolic parameters are indeterminate.  2. Right ventricular systolic function is normal. The right ventricular size  is normal. Tricuspid regurgitation signal is inadequate for assessing PA pressure.  3. The mitral valve is normal in structure. No evidence of mitral valve regurgitation. No evidence of mitral stenosis.  4. The aortic valve is normal in structure. Aortic valve regurgitation is not visualized. No aortic stenosis is present.  5. The inferior vena cava is normal in size with greater than 50% respiratory variability, suggesting right atrial pressure of 3 mmHg. FINDINGS  Left Ventricle: Left ventricular ejection fraction, by estimation, is 55 to 60%. The left ventricle has normal function. The left ventricle has no regional wall motion abnormalities. The left ventricular internal cavity size was normal in size. There is  mild concentric left ventricular hypertrophy. Left ventricular diastolic parameters are indeterminate. Right Ventricle: The right ventricular size is normal. No increase in right ventricular wall thickness. Right ventricular systolic function is normal. Tricuspid regurgitation signal is inadequate for assessing PA pressure. Left Atrium: Left atrial size was normal in size. Right Atrium: Right atrial size was normal in size. Pericardium: There is no evidence of pericardial effusion. Mitral Valve: The mitral valve is normal in structure. No evidence of mitral valve regurgitation. No evidence of mitral valve stenosis. Tricuspid Valve: The tricuspid valve is normal in structure. Tricuspid valve regurgitation is not demonstrated. No evidence of tricuspid stenosis. Aortic Valve: The aortic valve is normal in structure. Aortic valve regurgitation is not visualized. No aortic stenosis is present. Pulmonic Valve: The pulmonic valve was normal in structure. Pulmonic valve regurgitation is not visualized. No evidence of pulmonic stenosis. Aorta: The aortic root is normal in size and structure. Venous: The inferior vena cava is normal in size with greater than  50% respiratory variability, suggesting right atrial  pressure of 3 mmHg. IAS/Shunts: No atrial level shunt detected by color flow Doppler.  LEFT VENTRICLE PLAX 2D LVIDd:         4.30 cm     Diastology LVIDs:         3.00 cm     LV e' medial:    7.83 cm/s LV PW:         1.10 cm     LV E/e' medial:  6.4 LV IVS:        1.00 cm     LV e' lateral:   9.57 cm/s LVOT diam:     1.90 cm     LV E/e' lateral: 5.2 LVOT Area:     2.84 cm  LV Volumes (MOD) LV vol d, MOD A2C: 73.1 ml LV vol d, MOD A4C: 72.9 ml LV vol s, MOD A2C: 28.5 ml LV vol s, MOD A4C: 29.8 ml LV SV MOD A2C:     44.6 ml LV SV MOD A4C:     72.9 ml LV SV MOD BP:      44.0 ml RIGHT VENTRICLE RV Basal diam:  3.20 cm RV S prime:     10.90 cm/s TAPSE (M-mode): 2.6 cm LEFT ATRIUM             Index LA diam:        3.70 cm 1.90 cm/m LA Vol (A2C):   75.4 ml 38.67 ml/m LA Vol (A4C):   50.7 ml 26.00 ml/m LA Biplane Vol: 62.0 ml 31.80 ml/m   AORTA Ao Root diam: 3.00 cm Ao Asc diam:  3.00 cm MITRAL VALVE MV Area (PHT): 4.04 cm     SHUNTS MV Decel Time: 188 msec     Systemic Diam: 1.90 cm MV E velocity: 50.10 cm/s MV A velocity: 105.00 cm/s MV E/A ratio:  0.48 Kardie Tobb DO Electronically signed by Dub Huntsman DO Signature Date/Time: 09/29/2023/2:49:21 PM    Final    CT ABDOMEN PELVIS WO CONTRAST Result Date: 09/28/2023 CLINICAL DATA:  Generalized abdominal pain following fall, initial encounter EXAM: CT ABDOMEN AND PELVIS WITHOUT CONTRAST TECHNIQUE: Multidetector CT imaging of the abdomen and pelvis was performed following the standard protocol without IV contrast. RADIATION DOSE REDUCTION: This exam was performed according to the departmental dose-optimization program which includes automated exposure control, adjustment of the mA and/or kV according to patient size and/or use of iterative reconstruction technique. COMPARISON:  None Available. FINDINGS: Lower chest: No acute abnormality. Hepatobiliary: Liver is within normal limits. Gallbladder is well distended with multiple dependent stones extending into the  gallbladder neck. No complicating factors are noted. Pancreas: Unremarkable. No pancreatic ductal dilatation or surrounding inflammatory changes. Spleen: Normal in size without focal abnormality. Adrenals/Urinary Tract: Adrenal glands are within normal limits. Kidneys are well visualized bilaterally without renal calculi or obstructive changes. The bladder is well distended. Stomach/Bowel: Scattered mild diverticular changes noted without evidence of diverticulitis. Fecal material is noted throughout the colon without obstructive change. The appendix is unremarkable. Small bowel and stomach are within normal limits. Vascular/Lymphatic: Aortic atherosclerosis. No enlarged abdominal or pelvic lymph nodes. Reproductive: Calcified uterine fibroids are noted. No adnexal mass is seen. Other: No abdominal wall hernia or abnormality. No abdominopelvic ascites. Musculoskeletal: Degenerative changes are noted worst at T11-T12. IMPRESSION: Cholelithiasis without complicating factors. Diverticulosis without diverticulitis. Electronically Signed   By: Oneil Devonshire M.D.   On: 09/28/2023 21:52   CT Head Wo Contrast Result Date: 09/28/2023 EXAM: CT  HEAD WITHOUT CONTRAST 09/28/2023 09:41:30 PM TECHNIQUE: CT of the head was performed without the administration of intravenous contrast. Automated exposure control, iterative reconstruction, and/or weight based adjustment of the mA/kV was utilized to reduce the radiation dose to as low as reasonably achievable. COMPARISON: 02/14/2022 CLINICAL HISTORY: Head trauma, moderate-severe. FINDINGS: BRAIN AND VENTRICLES: No acute hemorrhage. Gray-white differentiation is preserved. No hydrocephalus. No extra-axial collection. No mass effect or midline shift. ORBITS: No acute abnormality. SINUSES: Interval increase in the left mastoid effusion. SOFT TISSUES AND SKULL: No acute soft tissue abnormality. No skull fracture. IMPRESSION: 1. No acute intracranial abnormality. Electronically signed by:  Norman Gatlin MD 09/28/2023 09:49 PM EDT RP Workstation: [id number]   CT Cervical Spine Wo Contrast Result Date: 09/28/2023 CLINICAL DATA:  Recent fall with neck pain, initial encounter EXAM: CT CERVICAL SPINE WITHOUT CONTRAST TECHNIQUE: Multidetector CT imaging of the cervical spine was performed without intravenous contrast. Multiplanar CT image reconstructions were also generated. RADIATION DOSE REDUCTION: This exam was performed according to the departmental dose-optimization program which includes automated exposure control, adjustment of the mA and/or kV according to patient size and/or use of iterative reconstruction technique. COMPARISON:  None Available. FINDINGS: Alignment: Straightening of the normal cervical lordosis is noted likely related to muscular spasm. Skull base and vertebrae: 7 cervical segments are well visualized. Vertebral body height is well maintained. Multilevel osteophytic changes and facet hypertrophic changes are noted. No acute fracture or acute facet abnormality is noted. Note is made of left mastoid effusion. Soft tissues and spinal canal: Surrounding soft tissue structures are within normal limits. Upper chest: Visualized lung apices are unremarkable. Other: None IMPRESSION: Multilevel degenerative change without acute abnormality. Electronically Signed   By: Oneil Devonshire M.D.   On: 09/28/2023 21:48   DG Pelvis Portable Result Date: 09/28/2023 CLINICAL DATA:  Trauma, fall. EXAM: PORTABLE PELVIS 1-2 VIEWS COMPARISON:  None Available. FINDINGS: There is no evidence of pelvic fracture or diastasis. No pelvic bone lesions are seen. There are mild degenerative changes of both hips. IMPRESSION: No acute fracture or dislocation. Mild degenerative changes of both hips. Electronically Signed   By: Greig Pique M.D.   On: 09/28/2023 21:36   DG Chest Port 1 View Result Date: 09/28/2023 CLINICAL DATA:  Recent fall with chest pain, initial encounter EXAM: PORTABLE CHEST 1 VIEW  COMPARISON:  10/17/2019 FINDINGS: Cardiac shadow is mildly prominent but accentuated by the portable technique. Aortic calcifications are noted. The lungs are well aerated bilaterally. No acute bony abnormality is seen. IMPRESSION: No acute abnormality noted. Electronically Signed   By: Oneil Devonshire M.D.   On: 09/28/2023 21:35     Subjective: - no chest pain, shortness of breath, no abdominal pain, nausea or vomiting.   Discharge Exam: BP (!) 143/67   Pulse 66   Temp 97.9 F (36.6 C) (Oral)   Resp 17   Ht 5' 3 (1.6 m)   Wt 92.5 kg   SpO2 98%   BMI 36.14 kg/m   General: Pt is alert, awake, not in acute distress Cardiovascular: RRR, S1/S2 +, no rubs, no gallops Respiratory: CTA bilaterally, no wheezing, no rhonchi Abdominal: Soft, NT, ND, bowel sounds + Extremities: no edema, no cyanosis    The results of significant diagnostics from this hospitalization (including imaging, microbiology, ancillary and laboratory) are listed below for reference.     Microbiology: No results found for this or any previous visit (from the past 240 hours).   Labs: Basic Metabolic Panel: Recent Labs  Lab 09/28/23  2113 09/28/23 2159 09/29/23 0326 09/30/23 0417  NA 136 137 135 137  K 5.0 5.1 5.0 5.0  CL 104 103 102 103  CO2 24  --  21* 26  GLUCOSE 190* 186* 173* 149*  BUN 22 26* 17 13  CREATININE 1.00 1.10* 0.90 0.93  CALCIUM  8.5*  --  8.2* 8.8*   Liver Function Tests: Recent Labs  Lab 09/28/23 2113 09/30/23 0417  AST 28 52*  ALT 16 24  ALKPHOS 91 80  BILITOT 0.5 0.9  PROT 6.3* 5.9*  ALBUMIN 3.3* 2.8*   CBC: Recent Labs  Lab 09/28/23 2113 09/28/23 2159 09/29/23 0326 09/30/23 0417  WBC 36.1*  --  33.9* 43.0*  NEUTROABS 5.8  --   --  13.3*  HGB 11.9* 13.3 11.2* 11.4*  HCT 38.5 39.0 35.6* 36.5  MCV 89.3  --  87.7 88.2  PLT 303  --  292 293   CBG: Recent Labs  Lab 09/29/23 0723 09/29/23 1140 09/29/23 1713 09/30/23 0542 09/30/23 0807  GLUCAP 124* 162* 155* 146*  140*   Hgb A1c Recent Labs    09/29/23 0326  HGBA1C 8.8*   Lipid Profile No results for input(s): CHOL, HDL, LDLCALC, TRIG, CHOLHDL, LDLDIRECT in the last 72 hours. Thyroid  function studies Recent Labs    09/29/23 0326  TSH 1.214   Urinalysis    Component Value Date/Time   COLORURINE STRAW (A) 09/28/2023 2250   APPEARANCEUR CLEAR 09/28/2023 2250   LABSPEC 1.016 09/28/2023 2250   PHURINE 6.0 09/28/2023 2250   GLUCOSEU >=500 (A) 09/28/2023 2250   HGBUR MODERATE (A) 09/28/2023 2250   BILIRUBINUR NEGATIVE 09/28/2023 2250   KETONESUR NEGATIVE 09/28/2023 2250   PROTEINUR NEGATIVE 09/28/2023 2250   UROBILINOGEN 0.2 11/16/2013 1917   NITRITE NEGATIVE 09/28/2023 2250   LEUKOCYTESUR TRACE (A) 09/28/2023 2250    FURTHER DISCHARGE INSTRUCTIONS:   Get Medicines reviewed and adjusted: Please take all your medications with you for your next visit with your Primary MD   Laboratory/radiological data: Please request your Primary MD to go over all hospital tests and procedure/radiological results at the follow up, please ask your Primary MD to get all Hospital records sent to his/her office.   In some cases, they will be blood work, cultures and biopsy results pending at the time of your discharge. Please request that your primary care M.D. goes through all the records of your hospital data and follows up on these results.   Also Note the following: If you experience worsening of your admission symptoms, develop shortness of breath, life threatening emergency, suicidal or homicidal thoughts you must seek medical attention immediately by calling 911 or calling your MD immediately  if symptoms less severe.   You must read complete instructions/literature along with all the possible adverse reactions/side effects for all the Medicines you take and that have been prescribed to you. Take any new Medicines after you have completely understood and accpet all the possible adverse  reactions/side effects.    Do not drive when taking Pain medications or sleeping medications (Benzodaizepines)   Do not take more than prescribed Pain, Sleep and Anxiety Medications. It is not advisable to combine anxiety,sleep and pain medications without talking with your primary care practitioner   Special Instructions: If you have smoked or chewed Tobacco  in the last 2 yrs please stop smoking, stop any regular Alcohol  and or any Recreational drug use.   Wear Seat belts while driving.   Please note: You were cared for  by a hospitalist during your hospital stay. Once you are discharged, your primary care physician will handle any further medical issues. Please note that NO REFILLS for any discharge medications will be authorized once you are discharged, as it is imperative that you return to your primary care physician (or establish a relationship with a primary care physician if you do not have one) for your post hospital discharge needs so that they can reassess your need for medications and monitor your lab values.  Time coordinating discharge: 35 minutes  SIGNED:  Nilda Fendt, MD, PhD 09/30/2023, 8:26 AM

## 2023-09-30 NOTE — ED Notes (Signed)
 Pt ambulated to bathroom and back to bed with steady gait. Voiced no other needs at this time. Call light within reach and pt on monitor

## 2023-10-01 ENCOUNTER — Telehealth: Payer: Self-pay

## 2023-10-01 NOTE — Transitions of Care (Post Inpatient/ED Visit) (Signed)
   10/01/2023  Name: Kaitlyn Jackson MRN: [id number] DOB: 1946/02/28  Today's TOC FU Call Status: Today's TOC FU Call Status:: Unsuccessful Call (1st Attempt) Unsuccessful Call (1st Attempt) Date: 10/01/23  Attempted to reach the patient regarding the most recent Inpatient/ED visit. Unable to leave a voicemail message with mailbox was full per answering service.  Follow Up Plan: Additional outreach attempts will be made to reach the patient to complete the Transitions of Care (Post Inpatient/ED visit) call.   Richerd Fish, RN, BSN, CCM [facility], [facility] RN Care Manager Direct Dial: [phone]

## 2023-10-04 ENCOUNTER — Telehealth: Payer: Self-pay

## 2023-10-04 DIAGNOSIS — W010XXA Fall on same level from slipping, tripping and stumbling without subsequent striking against object, initial encounter: Secondary | ICD-10-CM | POA: Diagnosis not present

## 2023-10-04 DIAGNOSIS — E1142 Type 2 diabetes mellitus with diabetic polyneuropathy: Secondary | ICD-10-CM | POA: Diagnosis not present

## 2023-10-04 DIAGNOSIS — D72829 Elevated white blood cell count, unspecified: Secondary | ICD-10-CM | POA: Diagnosis not present

## 2023-10-04 DIAGNOSIS — M130 Polyarthritis, unspecified: Secondary | ICD-10-CM | POA: Diagnosis not present

## 2023-10-04 DIAGNOSIS — I10 Essential (primary) hypertension: Secondary | ICD-10-CM | POA: Diagnosis not present

## 2023-10-04 NOTE — Transitions of Care (Post Inpatient/ED Visit) (Signed)
   10/04/2023  Name: Kaitlyn Jackson MRN: [id number] DOB: 12-06-1945  Today's TOC FU Call Status: Today's TOC FU Call Status:: Unsuccessful Call (2nd Attempt) Unsuccessful Call (2nd Attempt) Date: 10/04/23  Attempted to reach the patient regarding the most recent Inpatient/ED visit. Call to phone number provided in demographics,  female answered stating he was grandson and that patient was not there at this time.   Follow Up Plan: Additional outreach attempts will be made to reach the patient to complete the Transitions of Care (Post Inpatient/ED visit) call.   Richerd Fish, RN, BSN, CCM [facility], [facility] RN Care Manager Direct Dial: [phone]

## 2023-10-04 NOTE — Patient Instructions (Addendum)
 Visit Information  Thank you for taking time to visit with me today. Please don't hesitate to contact me if I can be of assistance to you before our next scheduled telephone appointment.  Our next appointment is by telephone on 10/12/23 at 10:30 AM  Following is a copy of your care plan:   Goals Addressed             This Visit's Progress    VBCI Transitions of Care (TOC) Care Plan       Problems:  Recent Hospitalization for treatment of DMII and Fall; new Leukocytosis No Specialist appointment Patient has left messages and this Clinical research associate as well per AVS notes for [facility]  Patient states ED provider voiced concerns for Metformin, Janumet and Ozempic to discuss with PCP at appointment scheduled for today post hospital follow up  Goal:  Over the next 30 days, the patient will not experience hospital readmission  Interventions:   Falls Interventions: Reviewed medications and discussed potential side effects of medications such as dizziness and frequent urination Advised patient of importance of notifying provider of falls Assessed for falls since last encounter Screening for signs and symptoms of depression related to chronic disease state  Assessed social determinant of health barriers  Patient Self Care Activities:  Attend all scheduled provider appointments Attend church or other social activities Call pharmacy for medication refills 3-7 days in advance of running out of medications Call provider office for new concerns or questions  Notify RN Care Manager of TOC call rescheduling needs Participate in Transition of Care Program/Attend TOC scheduled calls  Plan:  An initial telephone outreach has been scheduled for:  The care management team will reach out to the patient again over the next 7 - 10 business days. Discuss diabetes medication at PCP appointment Follow up with Hematology/Cancer Center appointment from messages left for new patient appointment need.         Patient verbalizes understanding of instructions and care plan provided today and agrees to view in MyChart. Active MyChart status and patient understanding of how to access instructions and care plan via MyChart confirmed with patient.     Telephone follow up appointment with care management team member scheduled for: The patient has been provided with contact information for the care management team and has been advised to call with any health related questions or concerns.  Follow up with provider re: Medications  Please call the care guide team at [phone] if you need to cancel or reschedule your appointment.   Please call the [place]  National Suicide Prevention Lifeline: (409)236-9926 or TTY: 410-361-1377 TTY [phone]) to talk to a trained counselor if you are experiencing a Mental Health or Behavioral Health Crisis or need someone to talk to.  Richerd Fish, RN, BSN, CCM [facility], [facility] RN Care Manager Direct Dial: [phone]

## 2023-10-04 NOTE — Transitions of Care (Post Inpatient/ED Visit) (Signed)
 10/04/2023  Name: PARISSA CHIAO MRN: [id number] DOB: 1945/12/15  Today's TOC FU Call Status: Today's TOC FU Call Status:: Successful TOC FU Call Completed TOC FU Call Complete Date: 10/04/23 Patient's Name and Date of Birth confirmed.  Transition Care Management Follow-up Telephone Call Date of Discharge: 09/30/23 Discharge Facility: [facility]) Type of Discharge: Inpatient Admission Primary Inpatient Discharge Diagnosis:: Fall. right ribs pain How have you been since you were released from the hospital?: Better (a little better)  Items Reviewed: Did you receive and understand the discharge instructions provided?: Yes Medications obtained,verified, and reconciled?: Yes (Medications Reviewed) Any new allergies since your discharge?: No Dietary orders reviewed?: Yes Type of Diet Ordered:: Carb modified Do you have support at home?: Yes People in Home [RPT]: child(ren), adult Name of Support/Comfort Primary Source: Daughter - Apolinar  Medications Reviewed Today:  Patient states she was on Metformin 1000 mg daily prior to admission to speak with PCP today - not on AVS. Medications Reviewed Today     Reviewed by Eilleen Richerd GRADE, RN (Registered Nurse) on 10/04/23 at 1132  Med List Status: <None>   Medication Order Taking? Sig Documenting Provider Last Dose Status Informant  acetaminophen  (TYLENOL ) 500 MG tablet [id number] No Take 1,500 mg by mouth 2 (two) times daily as needed (back pain). [provider] 09/28/2023 Noon Active Self, Pharmacy Records  atorvastatin  (LIPITOR) 20 MG tablet [id number] No Take 20 mg by mouth at bedtime. [provider] Past Week Active Self, Pharmacy Records           Med Note (COFFELL, JON CHRISTELLA   Wed Sep 29, 2023 10:57 AM) Per dispense report, LF 10/29/2022 #90, 90 DS. Pt insists compliance.  BESIVANCE 0.6 % SUSP [id number] No Place 1 drop into the right eye See admin instructions. Instill one drop into right eye 4 times daily for 2  days after each monthly eye injection. [provider] 08/27/2023 Active Self, Pharmacy Records  calcium  carbonate (TUMS EX) 750 MG chewable tablet [id number] No Chew 750-1,500 mg by mouth 2 (two) times daily as needed for heartburn. [provider] Past Month Active Self, Pharmacy Records  Cholecalciferol  (VITAMIN D -3 PO) [id number] No Take 1 capsule by mouth at bedtime. [provider] Past Week Active Self, Pharmacy Records  famotidine (ZANTAC 360 MAX ST) 20 MG tablet [id number] No Take 20 mg by mouth 2 (two) times daily as needed for heartburn or indigestion. [provider] Past Month Active Self, Pharmacy Records  FARXIGA 10 MG TABS tablet [id number] No Take 10 mg by mouth daily. [provider] 09/28/2023 Morning Active Self, Pharmacy Records  furosemide  (LASIX ) 40 MG tablet [id number] No Take 20 mg by mouth daily. [provider] 09/28/2023 Morning Active Self, Pharmacy Records           Med Note (COFFELL, JON CHRISTELLA   Wed Sep 29, 2023 10:54 AM) No fill history found on dispense report/DrFirst. Pt states her dose was reduced from 40mg  QD to 20mg  QD. Pt insists compliance.   HUMALOG MIX 75/25 KWIKPEN (75-25) 100 UNIT/ML [name] [id number] No Inject 7-25 Units into the skin See admin instructions. Inject 25 units subcutaneously every morning and inject 7 units in the evening as needed for elevated blood sugar. [provider] 09/28/2023 Morning Active Self, Pharmacy Records           Med Note (COFFELL, JON CHRISTELLA   Wed Sep 29, 2023 10:50 AM) Per dispense report, LF 11/18/2022 #7mL, 90 DS. Pt insists compliance  with morning doses but often does not need the PM injection.  montelukast  (SINGULAIR ) 10 MG tablet [id number] No Take 10 mg by mouth at bedtime. [provider] Past Week Active Self, Pharmacy Records           Med Note (COFFELL, JON HERO   Wed Sep 29, 2023 10:52 AM) Per dispense report, LF 02/19/2023 #90, 90 DS. Pt insists compliance.   oxyCODONE  (OXY IR/ROXICODONE ) 5 MG immediate release tablet [id number]  Take 0.5 tablets (2.5 mg total) by mouth every 6 (six) hours as needed for breakthrough pain. Gherghe, Costin M, MD  Active   pregabalin  (LYRICA ) 100 MG capsule [id number] No Take 100 mg by mouth 2 (two) times daily. [provider] 09/28/2023 Morning Active Self, Pharmacy Records  Semaglutide, 2 MG/DOSE, (OZEMPIC, 2 MG/DOSE,) 8 MG/3ML SOPN [id number] No Inject 2 mg into the skin every Wednesday. [provider] Past Week Active Self, Pharmacy Records  sitaGLIPtin-metformin (JANUMET) 50-1000 MG tablet [id number] No Take 1 tablet by mouth daily. [provider] 09/28/2023 Morning Active Self, Pharmacy Records  warfarin (COUMADIN ) 10 MG tablet [id number] No Take 10 mg by mouth every evening. [provider] 09/27/2023 Active Self, Pharmacy Records           Med Note (COFFELL, ANGELA M   Wed Sep 29, 2023 10:56 AM) Pt confirms she is taking 10mg  QD.            Home Care and Equipment/Supplies: Were Home Health Services Ordered?: No Any new equipment or medical supplies ordered?: No  Functional Questionnaire: Do you need assistance with bathing/showering or dressing?: No Do you need assistance with meal preparation?: No Do you need assistance with eating?: No Do you have difficulty maintaining continence: No Do you need assistance with getting out of bed/getting out of a chair/moving?: No Do you have difficulty managing or taking your medications?: No  Follow up appointments reviewed: PCP Follow-up appointment confirmed?: Yes Date of PCP follow-up appointment?: 10/04/23 Follow-up Provider: Dr. Kennieth Leech Specialist [facility] Follow-up appointment confirmed?: No Reason Specialist Follow-Up Not Confirmed: [facility] Calling Clinician Notified Provider Practice of Needed Appointment (Left voicemail on new patient appointment with [facility] with patient's information and call back number and  writer's phone number) Do you need transportation to your follow-up appointment?: No Do you understand care options if your condition(s) worsen?: Yes-patient verbalized understanding  SDOH Interventions Today    Flowsheet Row Most Recent Value  SDOH Interventions   Food Insecurity Interventions Intervention Not Indicated  Housing Interventions Intervention Not Indicated  Transportation Interventions Intervention Not Indicated  [Patient drives]  Utilities Interventions Intervention Not Indicated    Goals Addressed             This Visit's Progress    VBCI Transitions of Care (TOC) Care Plan       Problems:  Recent Hospitalization for treatment of DMII and Fall; new Leukocytosis No Specialist appointment Patient has left messages and this Clinical research associate as well per AVS notes for [facility]  Patient states ED provider voiced concerns for Metformin, Janumet and Ozempic to discuss with PCP at appointment scheduled for today post hospital follow up  Goal:  Over the next 30 days, the patient will not experience hospital readmission  Interventions:   Falls Interventions: Reviewed medications and discussed potential side effects of medications such as dizziness and frequent urination Advised patient of importance of notifying provider of falls Assessed for falls since last encounter Screening for signs and symptoms of depression  related to chronic disease state  Assessed social determinant of health barriers  Patient Self Care Activities:  Attend all scheduled provider appointments Attend church or other social activities Call pharmacy for medication refills 3-7 days in advance of running out of medications Call provider office for new concerns or questions  Notify RN Care Manager of TOC call rescheduling needs Participate in Transition of Care Program/Attend TOC scheduled calls  Plan:  An initial telephone outreach has been scheduled for:  The care management team will  reach out to the patient again over the next 7 - 10 business days. Discuss diabetes medication at PCP appointment Follow up with Hematology/Cancer Center appointment from messages left for new patient appointment need.        Richerd Fish, RN, BSN, CCM [facility], [facility] RN Care Manager Direct Dial: [phone]

## 2023-10-06 ENCOUNTER — Encounter (INDEPENDENT_AMBULATORY_CARE_PROVIDER_SITE_OTHER): Admitting: Ophthalmology

## 2023-10-06 DIAGNOSIS — E113311 Type 2 diabetes mellitus with moderate nonproliferative diabetic retinopathy with macular edema, right eye: Secondary | ICD-10-CM

## 2023-10-06 DIAGNOSIS — Z794 Long term (current) use of insulin: Secondary | ICD-10-CM

## 2023-10-06 DIAGNOSIS — E113392 Type 2 diabetes mellitus with moderate nonproliferative diabetic retinopathy without macular edema, left eye: Secondary | ICD-10-CM | POA: Diagnosis not present

## 2023-10-06 DIAGNOSIS — H33303 Unspecified retinal break, bilateral: Secondary | ICD-10-CM

## 2023-10-06 DIAGNOSIS — H35033 Hypertensive retinopathy, bilateral: Secondary | ICD-10-CM

## 2023-10-06 DIAGNOSIS — H43813 Vitreous degeneration, bilateral: Secondary | ICD-10-CM

## 2023-10-06 DIAGNOSIS — I10 Essential (primary) hypertension: Secondary | ICD-10-CM

## 2023-10-11 DIAGNOSIS — E1169 Type 2 diabetes mellitus with other specified complication: Secondary | ICD-10-CM | POA: Diagnosis not present

## 2023-10-12 ENCOUNTER — Other Ambulatory Visit: Payer: Self-pay

## 2023-10-12 NOTE — Patient Instructions (Signed)
 Visit Information  Thank you for taking time to visit with me today. Please don't hesitate to contact me if I can be of assistance to you before our next scheduled telephone appointment.  Our next appointment is by telephone on 10/19/23 at 1045 am  Following is a copy of your care plan:   Goals Addressed             This Visit's Progress    VBCI Transitions of Care (TOC) Care Plan       Problems: (Reviewed with patient and updated 10/12/23) Recent Hospitalization for treatment of DMII and Fall; new Leukocytosis No Specialist appointment Patient has left messages and this Clinical research associate as well per AVS notes for [facility] 10/12/23 Appointment made for October 26, 2023 Patient states ED provider voiced concerns for Metformin, Janumet and Ozempic to discuss with PCP at appointment scheduled for today post hospital follow up (reviewed for Metformin and currently discontinued)   Goal:  Over the next 30 days, the patient will not experience hospital readmission  Interventions:   Falls Interventions: Reviewed medications and discussed potential side effects of medications such as dizziness and frequent urination Advised patient of importance of notifying provider of falls Assessed for falls since last encounter Screening for signs and symptoms of depression related to chronic disease state  Assessed social determinant of health barriers  Patient Self Care Activities:  Attend all scheduled provider appointments Attend church or other social activities Call pharmacy for medication refills 3-7 days in advance of running out of medications Call provider office for new concerns or questions  Notify RN Care Manager of TOC call rescheduling needs Participate in Transition of Care Program/Attend TOC scheduled calls  Plan:  An initial telephone outreach has been scheduled for:  The care management team will reach out to the patient again over the next 7 - 10 business days. Discuss  diabetes medication at PCP appointment - 10/12/23 patient states PCP aware and following Follow up with Hematology/Cancer Center appointment from messages left for new patient appointment need. 10/12/23 appointment made and following for any additional needs. Continue current plan, monitor diet states sometimes during night blood sugar drops around 60 and Libre alerts and takes a snack, most of time blood sugars in the 130 - 150's        Patient verbalizes understanding of instructions and care plan provided today and agrees to view in MyChart. Active MyChart status and patient understanding of how to access instructions and care plan via MyChart confirmed with patient.     The patient has been provided with contact information for the care management team and has been advised to call with any health related questions or concerns.   Please call the care guide team at [phone] if you need to cancel or reschedule your appointment.   Please call the [place]  National Suicide Prevention Lifeline: (818)750-4486 or TTY: (651)215-0204 TTY 930-804-6539) to talk to a trained counselor if you are experiencing a Mental Health or Behavioral Health Crisis or need someone to talk to.  Richerd Fish, RN, BSN, CCM [facility], [facility] RN Care Manager Direct Dial: [phone]

## 2023-10-12 NOTE — Transitions of Care (Post Inpatient/ED Visit) (Signed)
 Transition of Care week 2  Visit Note  10/12/2023  Name: Kaitlyn Jackson MRN: [id number]          DOB: Feb 26, 1946  Situation: Patient enrolled in [facility] 30-day program. Visit completed with patient by telephone.   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 since 1996   Insulin  dependent diabetes mellitus    Mitral valve prolapse    Neuropathy    Osteoarthritis    bilateral knee   Seasonal allergies    Sleep apnea    no CPAP use in several months, per pt.   Systemic lupus erythematosus (HCC)    Trigger finger, left middle finger 01/2017    Assessment: Patient Reported Symptoms: Cognitive Cognitive Status: Alert and oriented to person, place, and time Cognitive/Intellectual Conditions Management [RPT]: None reported or documented in medical history or problem list      Neurological Neurological Review of Symptoms: No symptoms reported Neurological Management Strategies: Activity, Medication therapy Neurological Self-Management Outcome: 4 (good)  HEENT HEENT Symptoms Reported: Sudden change or loss of vision (Monthly eye injections monthly)      Cardiovascular Cardiovascular Symptoms Reported: No symptoms reported Cardiovascular Management Strategies: Activity, Adequate rest, Medication therapy Cardiovascular Self-Management Outcome: 4 (good) Cardiovascular Comment: no problems  Respiratory Respiratory Symptoms Reported: No symptoms reported Respiratory Self-Management Outcome: 4 (good)  Endocrine Endocrine Symptoms  Reported: No symptoms reported Is patient diabetic?: Yes Is patient checking blood sugars at home?: Yes List most recent blood sugar readings, include date and time of day: Libre 3  130 -150's has dropped in 60's during sleep (if drops around 60 she get a snak) Endocrine Self-Management Outcome: 3 (uncertain) (I feel like I'm doing good added Glycerna) Endocrine Comment: Goal < 7  Gastrointestinal Gastrointestinal Symptoms Reported: No symptoms reported Additional Gastrointestinal Details: appetite improving Gastrointestinal Self-Management Outcome: 4 (good)    Genitourinary Genitourinary Symptoms Reported: No symptoms reported Genitourinary Self-Management Outcome: 4 (good)  Integumentary Integumentary Symptoms Reported: No symptoms reported Skin Management Strategies: Activity, Adequate rest Skin Self-Management Outcome: 4 (good) Skin Comment: Encouraging diabetic foot exams and care - ongoing  Musculoskeletal Musculoskelatal Symptoms Reviewed: No symptoms reported (HX of musculoskeletal joints knee and back , as well as carpal tunnel hands) Additional Musculoskeletal Details: better a little sore (Taking care of her 86 year old great-granddaughter) Musculoskeletal Management Strategies: Activity, Adequate rest, Medication therapy Musculoskeletal Self-Management Outcome: 4 (good) Falls in the past year?: Yes (Felt like it was due to weakness mostly and whatever is going on with my white blood cells. I guess) Number of falls in past year: 1 or less Was there an injury with Fall?: Yes Fall Risk Category Calculator: 2 Patient Fall Risk Level: Moderate Fall Risk Patient at Risk for Falls Due to: History of fall(s) (I am being extra cautious with getting around especially with this little one here ) Fall risk Follow up: Falls prevention discussed  Psychosocial Psychosocial Symptoms Reported: No symptoms reported  Vitals:    Medications Reviewed Today     Reviewed by  Eilleen Richerd GRADE, RN (Registered Nurse) on 10/12/23 at 1043  Med List Status: <None>   Medication Order Taking? Sig Documenting Provider Last Dose Status Informant  acetaminophen  (TYLENOL ) 500 MG tablet [id number] Yes Take 1,500 mg by mouth 2 (two) times daily as needed (back pain). [provider]  Active Self, Pharmacy Records  atorvastatin  (LIPITOR) 20 MG tablet [id number] Yes Take 20 mg by mouth at bedtime. [provider]  Active Self, Pharmacy Records           Med Note (COFFELL, JON HERO   Wed Sep 29, 2023 10:57 AM) Per dispense report, LF 10/29/2022 #90, 90 DS. Pt insists compliance.  BESIVANCE 0.6 % SUSP [id number] Yes Place 1 drop into the right eye See admin instructions. Instill one drop into right eye 4 times daily for 2 days after each monthly eye injection. [provider]  Active Self, Pharmacy Records  calcium  carbonate (TUMS EX) 750 MG chewable tablet [id number] Yes Chew 750-1,500 mg by mouth 2 (two) times daily as needed for heartburn. [provider]  Active Self, Pharmacy Records  Cholecalciferol  (VITAMIN D -3 PO) [id number] Yes Take 1 capsule by mouth at bedtime. [provider]  Active Self, Pharmacy Records  famotidine (ZANTAC 360 MAX ST) 20 MG tablet [id number] Yes Take 20 mg by mouth 2 (two) times daily as needed for heartburn or indigestion. [provider]  Active Self, Pharmacy Records  FARXIGA 10 MG TABS tablet [id number] Yes Take 10 mg by mouth daily. [provider]  Active Self, Pharmacy Records  furosemide  (LASIX ) 40 MG tablet [id number] Yes Take 20 mg by mouth daily. [provider]  Active Self, Pharmacy Records           Med Note (COFFELL, JON HERO   Wed Sep 29, 2023 10:54 AM) No fill history found on dispense report/DrFirst. Pt states her dose was reduced from 40mg  QD to 20mg  QD. Pt insists compliance.   HUMALOG MIX 75/25 KWIKPEN (75-25) 100 UNIT/ML [name] [id number] Yes Inject 7-25 Units into the  skin See admin instructions. Inject 25 units subcutaneously every morning and inject 7 units in the evening as needed for elevated blood sugar. [provider]  Active Self, Pharmacy Records           Med Note (COFFELL, ANGELA M   Wed Sep 29, 2023 10:50 AM) Per dispense report, LF 11/18/2022 #68mL, 90 DS. Pt insists compliance with morning doses but often does not need the PM injection.  montelukast  (SINGULAIR ) 10 MG tablet [id number] Yes Take 10 mg by mouth at bedtime. [provider]  Active Self, Pharmacy Records           Med Note (COFFELL, JON HERO   Wed Sep 29, 2023 10:52 AM) Per dispense report, LF 02/19/2023 #90, 90 DS. Pt insists compliance.  oxyCODONE  (OXY IR/ROXICODONE ) 5 MG immediate release tablet [id number] Yes Take 0.5 tablets (2.5 mg total) by mouth every 6 (six) hours as needed for breakthrough pain. Gherghe, Costin M, MD  Active   pregabalin  (LYRICA ) 100 MG capsule [id number] Yes Take 100 mg by mouth 2 (two) times daily. [provider]  Active Self, Pharmacy Records  Semaglutide, 2 MG/DOSE, (OZEMPIC, 2 MG/DOSE,) 8 MG/3ML SOPN [id number] Yes Inject 2 mg into the skin every Wednesday. [provider]  Active Self, Pharmacy Records  sitaGLIPtin-metformin (JANUMET) 50-1000 MG tablet [id number] Yes Take 1 tablet by mouth daily. [provider]  Active Self, Pharmacy Records  warfarin (COUMADIN ) 10 MG tablet [id number] Yes Take 10 mg by mouth every evening. [provider]  Active Self, Pharmacy Records           Med Note (COFFELL, ANGELA M   Wed Sep 29, 2023 10:56 AM) Pt confirms she is taking 10mg  QD.            Recommendation:   Continue Current Plan of Care   Richerd Fish, RN, BSN, CCM [facility], [facility] RN Care Manager Direct Dial: [phone]

## 2023-10-15 DIAGNOSIS — I10 Essential (primary) hypertension: Secondary | ICD-10-CM | POA: Diagnosis not present

## 2023-10-15 DIAGNOSIS — E1165 Type 2 diabetes mellitus with hyperglycemia: Secondary | ICD-10-CM | POA: Diagnosis not present

## 2023-10-15 DIAGNOSIS — M545 Low back pain, unspecified: Secondary | ICD-10-CM | POA: Diagnosis not present

## 2023-10-15 DIAGNOSIS — E0800 Diabetes mellitus due to underlying condition with hyperosmolarity without nonketotic hyperglycemic-hyperosmolar coma (NKHHC): Secondary | ICD-10-CM | POA: Diagnosis not present

## 2023-10-19 ENCOUNTER — Other Ambulatory Visit: Payer: Self-pay

## 2023-10-19 ENCOUNTER — Telehealth: Payer: Self-pay

## 2023-10-19 NOTE — Transitions of Care (Post Inpatient/ED Visit) (Signed)
 Transition of Care week 3  Visit Note  10/19/2023  Name: Kaitlyn Jackson MRN: [id number]          DOB: 08/08/1945  Situation: Patient enrolled in [facility] 30-day program. Visit completed with patient by telephone.   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 since 1996   Insulin  dependent diabetes mellitus    Mitral valve prolapse    Neuropathy    Osteoarthritis    bilateral knee   Seasonal allergies    Sleep apnea    no CPAP use in several months, per pt.   Systemic lupus erythematosus (HCC)    Trigger finger, left middle finger 01/2017    Assessment: Patient Reported Symptoms: Cognitive Cognitive Status: Alert and oriented to person, place, and time, Normal speech and language skills Cognitive/Intellectual Conditions Management [RPT]: None reported or documented in medical history or problem list   Health Maintenance Behaviors: Annual physical exam, Healthy diet, Sleep adequate, Social activities  Neurological Neurological Review of Symptoms: No symptoms reported Neurological Self-Management Outcome: 4 (good) Neurological Comment: still deal with joint issues but active with great granddaughter  HEENT HEENT Symptoms Reported: No symptoms reported (Receives monthly eye injections, no compaints) HEENT Management Strategies: Medication therapy, Activity, Adequate rest, Routine screening HEENT Self-Management Outcome: 4 (good)    Cardiovascular   Weight: 194 lb (88 kg)  Respiratory Respiratory Symptoms Reported: No  symptoms reported    Endocrine Endocrine Symptoms Reported: No symptoms reported Is patient diabetic?: Yes Is patient checking blood sugars at home?: Yes List most recent blood sugar readings, include date and time of day: 153 Endocrine Self-Management Outcome: 4 (good) Endocrine Comment: Meeting at goal  Gastrointestinal Gastrointestinal Symptoms Reported: Distention Additional Gastrointestinal Details: got Gas X Gastrointestinal Self-Management Outcome: 4 (good)    Genitourinary Genitourinary Symptoms Reported: No symptoms reported Genitourinary Self-Management Outcome: 4 (good)  Integumentary Integumentary Symptoms Reported: No symptoms reported Skin Self-Management Outcome: 4 (good) Skin Comment: Encouraging diabetic foot exams and daily foot care - ongoing  Musculoskeletal Musculoskelatal Symptoms Reviewed: Other Other Musculoskeletal Symptoms: stiffness hx of osteoarthritis, carpal tunnel, trigger finger Additional Musculoskeletal Details: doiing good, able to braid great granddaughter's hair everyday Musculoskeletal Management Strategies: Activity, Adequate rest, Exercise, Medication therapy Musculoskeletal Self-Management Outcome: 4 (good) Falls in the past year?: Yes Number of falls in past year: 1 or less Was there an injury with Fall?: Yes Fall Risk Category Calculator: 2 Patient Fall Risk Level: Moderate Fall Risk Patient at Risk for Falls Due to: History of fall(s) Fall risk Follow up: Falls prevention discussed  Psychosocial Psychosocial Symptoms Reported: No symptoms reported Behavioral Management Strategies: Activity, Adequate rest, Exercise, Medication therapy Behavioral Health Self-Management Outcome: 4 (good)   Do you feel physically threatened by others?: No   There were no vitals filed for this visit.  Medications Reviewed Today     Reviewed by Eilleen Richerd GRADE, RN (Registered Nurse) on 10/19/23 at 1144  Med List Status: <  None>   Medication Order  Taking? Sig Documenting Provider Last Dose Status Informant  acetaminophen  (TYLENOL ) 500 MG tablet [id number]  Take 1,500 mg by mouth 2 (two) times daily as needed (back pain). [provider]  Active Self, Pharmacy Records  atorvastatin  (LIPITOR) 20 MG tablet [id number]  Take 20 mg by mouth at bedtime. [provider]  Active Self, Pharmacy Records           Med Note (COFFELL, JON HERO   Wed Sep 29, 2023 10:57 AM) Per dispense report, LF 10/29/2022 #90, 90 DS. Pt insists compliance.  BESIVANCE 0.6 % SUSP [id number]  Place 1 drop into the right eye See admin instructions. Instill one drop into right eye 4 times daily for 2 days after each monthly eye injection. [provider]  Active Self, Pharmacy Records  calcium  carbonate (TUMS EX) 750 MG chewable tablet [id number]  Chew 750-1,500 mg by mouth 2 (two) times daily as needed for heartburn. [provider]  Active Self, Pharmacy Records  Cholecalciferol  (VITAMIN D -3 PO) [id number]  Take 1 capsule by mouth at bedtime. [provider]  Active Self, Pharmacy Records  famotidine (ZANTAC 360 MAX ST) 20 MG tablet [id number]  Take 20 mg by mouth 2 (two) times daily as needed for heartburn or indigestion. [provider]  Active Self, Pharmacy Records  FARXIGA 10 MG TABS tablet [id number]  Take 10 mg by mouth daily. [provider]  Active Self, Pharmacy Records  furosemide  (LASIX ) 40 MG tablet [id number]  Take 20 mg by mouth daily. [provider]  Active Self, Pharmacy Records           Med Note (COFFELL, JON HERO   Wed Sep 29, 2023 10:54 AM) No fill history found on dispense report/[facility]. Pt states her dose was reduced from 40mg  QD to 20mg  QD. Pt insists compliance.   HUMALOG MIX 75/25 KWIKPEN (75-25) 100 UNIT/ML [name] [id number]  Inject 7-25 Units into the skin See admin instructions. Inject 25 units subcutaneously every morning and inject 7 units in the evening as needed for elevated blood  sugar. [provider]  Active Self, Pharmacy Records           Med Note (COFFELL, ANGELA M   Wed Sep 29, 2023 10:50 AM) Per dispense report, LF 11/18/2022 #14mL, 90 DS. Pt insists compliance with morning doses but often does not need the PM injection.  montelukast  (SINGULAIR ) 10 MG tablet [id number]  Take 10 mg by mouth at bedtime. [provider]  Active Self, Pharmacy Records           Med Note (COFFELL, JON HERO   Wed Sep 29, 2023 10:52 AM) Per dispense report, LF 02/19/2023 #90, 90 DS. Pt insists compliance.  oxyCODONE  (OXY IR/ROXICODONE ) 5 MG immediate release tablet [id number]  Take 0.5 tablets (2.5 mg total) by mouth every 6 (six) hours as needed for breakthrough pain. Gherghe, Costin M, MD  Active   pregabalin  (LYRICA ) 100 MG capsule [id number]  Take 100 mg by mouth 2 (two) times daily. [provider]  Active Self, Pharmacy Records  Semaglutide, 2 MG/DOSE, (OZEMPIC, 2 MG/DOSE,) 8 MG/3ML SOPN [id number]  Inject 2 mg into the skin every Wednesday. [provider]  Active Self, Pharmacy Records  sitaGLIPtin-metformin (JANUMET) 50-1000 MG tablet [id number]  Take 1 tablet by mouth daily. [provider]  Active Self, Pharmacy Records  warfarin (COUMADIN ) 10 MG tablet [id number]  Take 10 mg by mouth every evening. [provider]  Active  Self, Pharmacy Records           Med Note (COFFELL, JON HERO   Wed Sep 29, 2023 10:56 AM) Pt confirms she is taking 10mg  QD.            Recommendation:   Continue Current Plan of Care  Richerd Fish, RN, BSN, CCM [facility], [facility] RN Care Manager Direct Dial: [phone]

## 2023-10-19 NOTE — Patient Instructions (Signed)
 Visit Information  Thank you for taking time to visit with me today. Please don't hesitate to contact me if I can be of assistance to you before our next scheduled telephone appointment.  Our next appointment is by telephone on October 29, 2023 at 11:00 AM  Following is a copy of your care plan:   Goals Addressed             This Visit's Progress    [facility] Transitions of Care (TOC) Care Plan       Problems: (Reviewed with patient and updated 10/19/23) Recent Hospitalization for treatment of DMII and Fall; new Leukocytosis No Specialist appointment Patient has left messages and this Clinical research associate as well per AVS notes for [facility] 10/12/23 Appointment made for October 26, 2023 Patient states ED provider voiced concerns for Metformin, Janumet and Ozempic to discuss with PCP at appointment scheduled for today post hospital follow up (reviewed for Metformin and currently discontinued) Patient continue to monitor with Herlene, some stomach upset with gas, patient on Ozempic, also  Goal:  Over the next 30 days, the patient will not experience hospital readmission  Interventions:   Falls Interventions: Reviewed medications and discussed potential side effects of medications such as dizziness and frequent urination Advised patient of importance of notifying provider of falls Assessed for falls since last encounter Screening for signs and symptoms of depression related to chronic disease state  Assessed social determinant of health barriers  Patient Self Care Activities:  Attend all scheduled provider appointments Attend church or other social activities Call pharmacy for medication refills 3-7 days in advance of running out of medications Call provider office for new concerns or questions  Notify RN Care Manager of TOC call rescheduling needs Participate in Transition of Care Program/Attend TOC scheduled calls Weight loss noted on Ozempic, monitoring blood sugars through [employer],  encourage eating more fresh fruits and vegetables.. Attending family functions and driving without issues  Plan:  An initial telephone outreach has been scheduled for:  The care management team will reach out to the patient again over the next 7 - 10 business days. Discuss diabetes medication at PCP appointment - 10/12/23 patient states PCP aware and following Follow up with Hematology/[facility] Center appointment from messages left for new patient appointment need. 10/12/23 appointment made and following for any additional needs. 10/19/23 Continue current plan, monitor diet states sometimes during night blood sugar drops around 60 and Libre alerts and takes a snack, most of time blood sugars in the 130 - 150's. 10/19/23 having some gas discomforts from time to time, patient taking Gas X last evening, better today, so far she said. States she weighed 194 lbs and the first time under 200 lbs in many years.        Patient verbalizes understanding of instructions and care plan provided today and agrees to view in MyChart. Active MyChart status and patient understanding of how to access instructions and care plan via MyChart confirmed with patient.     The patient has been provided with contact information for the care management team and has been advised to call with any health related questions or concerns.  The care management team will reach out to the patient again over the next 5- 10 business days.  The patient will call provider* as advised to for any increased abdominal discomfort not managed by current medication regimen.   Please call the care guide team at [phone] if you need to cancel or reschedule your appointment.   Please  call the [place]  National Suicide Prevention Lifeline: 901-264-7250 or TTY: 4127936557 TTY 4027823429) to talk to a trained counselor if you are experiencing a Mental Health or Behavioral Health Crisis or need someone to talk to.  Richerd Fish, RN, BSN,  CCM [facility], [facility] RN Care Manager Direct Dial: [phone]

## 2023-10-25 DIAGNOSIS — D649 Anemia, unspecified: Secondary | ICD-10-CM | POA: Insufficient documentation

## 2023-10-25 DIAGNOSIS — C9110 Chronic lymphocytic leukemia of B-cell type not having achieved remission: Secondary | ICD-10-CM | POA: Insufficient documentation

## 2023-10-25 NOTE — Assessment & Plan Note (Addendum)
 Ferritin, b12, folate

## 2023-10-25 NOTE — Progress Notes (Signed)
 [facility] CONSULT NOTE  Patient Care Team: Benjamine Aland, MD as PCP - General (Family Medicine) Eilleen Richerd GRADE, RN as [facility] Care Management   ASSESSMENT & PLAN 78 y.o. female with history of DM2, OSA, lupus, PE on warfarin being seen for lymphocytosis.  Flow cytometry showed CLL. Borderline anemia though she is 78 yo and normal platelet.  This cussed with patient, her leukocytosis is not extremely high.  She does not have significant systemic symptoms, lymphadenopathy, anemia or thrombocytopenia.  Recent CT without any hepatomegaly or splenomegaly.  Clinically is early stage, recommend CT and continued surveillance.  Patient will return for follow-up in about 2 to 3 weeks to go over cytogenetic results.  Of note, patient reports she has been on warfarin.  She is not sure if has been checking.  Advised her to continue follow-up with PCP and continue to monitor safely. Assessment & Plan CLL (chronic lymphocytic leukemia) (HCC) Obtain CG and CBC Follow up in 2 to 3 weeks to go over results. 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 Patient was advised by previous provider to continue lifelong anticoagulation.  Orders Placed This Encounter  Procedures   CBC with Differential (Cancer Center Only)    Standing Status:   Future    Number of Occurrences:   1    Expiration Date:   10/25/2024   Lactate dehydrogenase    Standing Status:   Future    Number of Occurrences:   1    Expiration Date:   10/25/2024   Folate    Standing Status:   Future    Number of Occurrences:   1    Expiration Date:   10/25/2024   Ferritin    Standing Status:   Future    Number of Occurrences:   1    Expiration Date:   10/25/2024   Vitamin B12    Standing Status:   Future    Number of Occurrences:   1    Expiration Date:   10/25/2024   Methylmalonic acid, serum    Standing Status:    Future    Number of Occurrences:   1    Expiration Date:   11/26/2023   FISH CLL Leukemia    Standing Status:   Future    Number of Occurrences:   1    Expiration Date:   10/25/2024   Kaitlyn JAYSON Chihuahua, MD 10/26/2023 12:24 PM   CHIEF COMPLAINTS/PURPOSE OF CONSULTATION:  lymphocytosis  HISTORY OF PRESENTING ILLNESS:  Kaitlyn Jackson 78 y.o. female is here because of elevated WBC.  During recent hospitalization for falling, she was found to have CLL.  7/17 Flow cytometric analysis identified a clonal B-cell population  constituting 91% of lymphocytes (19,800 cells per microliter).  The B  cells are positive for CD5, CD19, CD20, CD38, CD200 and express kappa  light chains.  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   Clinically, she has no lymphadenopathy, unintentional weight loss, night sweats.  No chest pain, shortness of breath or abdominal pain.  She has some fatigue.  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 since 1996   Insulin  dependent diabetes mellitus    Mitral valve prolapse    Neuropathy    Osteoarthritis    bilateral knee   Seasonal allergies    Sleep apnea    no CPAP use in several months, per pt.   Systemic lupus erythematosus (HCC)    Trigger finger, left middle finger 01/2017    SURGICAL HISTORY: Past Surgical History:  Procedure Laterality Date   BACK SURGERY     lower - removed  cysts   CATARACT EXTRACTION W/ INTRAOCULAR LENS  IMPLANT, BILATERAL Bilateral    GLAUCOMA SURGERY Bilateral    laser    HEMILAMINOTOMY LUMBAR SPINE Right 01/30/2009   L5   JOINT REPLACEMENT     TOTAL KNEE ARTHROPLASTY Right 09/22/2007   TOTAL KNEE ARTHROPLASTY Left 02/01/2007   TOTAL KNEE REVISION Right 01/31/2019   Procedure: RIGHT TOTAL KNEE REVISION;  Surgeon: Anderson Maude ORN, MD;  Location: [facility];  Service: Orthopedics;  Laterality: Right;   TRIGGER FINGER RELEASE Right 12/14/2013   Procedure: RELEASE TRIGGER FINGER/A-1 PULLEY RIGHT RING FINGER;  Surgeon: Arley Curia, MD;  Location: [facility];  Service: Orthopedics;  Laterality: Right;   TRIGGER FINGER RELEASE Left 02/11/2017   Procedure: RELEASE TRIGGER FINGER/A-1 PULLEY;  Surgeon: Curia Drivers, MD;  Location: [facility];  Service: Orthopedics;  Laterality: Left;   TRIGGER FINGER RELEASE Right 11/19/2022   Procedure: RELEASE TRIGGER FINGER/A-1 PULLEY RIGHT INDEX FINGER;  Surgeon: Curia Drivers, MD;  Location: [facility];  Service: Orthopedics;  Laterality: Right;  30 MIN    SOCIAL HISTORY: Social History   Socioeconomic History   Marital status: Married    Spouse name: Not on file   Number of children: Not on file   Years of education: Not on file   Highest education level: Not on file  Occupational History   Not on file  Tobacco Use   Smoking status: Never   Smokeless tobacco: Never  Vaping Use   Vaping status: Never Used  Substance and Sexual Activity   Alcohol use: No   Drug use: No   Sexual activity: Not on file  Other Topics Concern   Not on file  Social History Narrative   Not on file   Social Drivers of Health   Financial Resource Strain: Not on file  Food Insecurity: No Food Insecurity (10/26/2023)   Hunger Vital Sign    Worried About Running Out of Food in the Last Year: Never true    Ran Out of Food in the Last Year: Never true  Transportation Needs: No Transportation Needs (10/26/2023)   PRAPARE - Administrator, Civil Service (Medical): No    Lack of Transportation  (Non-Medical): No  Physical Activity: Not on file  Stress: Not on file  Social Connections: Not on file  Intimate Partner Violence: Not At Risk (10/26/2023)   Humiliation, Afraid, Rape, and Kick questionnaire    Fear of Current or Ex-Partner: No    Emotionally Abused: No    Physically Abused: No    Sexually Abused: No    FAMILY HISTORY: Family History  Problem Relation Age of Onset   Cancer Mother  colon   Cancer Cousin        lung   Diabetes Sister    Diabetes Sister    Diabetes Sister     ALLERGIES:  is allergic to flagyl [metronidazole] and iodine.  MEDICATIONS:  Current Outpatient Medications  Medication Sig Dispense Refill   acetaminophen  (TYLENOL ) 500 MG tablet Take 1,500 mg by mouth 2 (two) times daily as needed (back pain).     atorvastatin  (LIPITOR) 20 MG tablet Take 20 mg by mouth at bedtime.     BESIVANCE 0.6 % SUSP Place 1 drop into the right eye See admin instructions. Instill one drop into right eye 4 times daily for 2 days after each monthly eye injection.     calcium  carbonate (TUMS EX) 750 MG chewable tablet Chew 750-1,500 mg by mouth 2 (two) times daily as needed for heartburn.     Cholecalciferol  (VITAMIN D -3 PO) Take 1 capsule by mouth at bedtime.     famotidine (ZANTAC 360 MAX ST) 20 MG tablet Take 20 mg by mouth 2 (two) times daily as needed for heartburn or indigestion.     FARXIGA 10 MG TABS tablet Take 10 mg by mouth daily.     furosemide  (LASIX ) 40 MG tablet Take 20 mg by mouth daily.     HUMALOG MIX 75/25 KWIKPEN (75-25) 100 UNIT/ML Kwikpen Inject 7-25 Units into the skin See admin instructions. Inject 25 units subcutaneously every morning and inject 7 units in the evening as needed for elevated blood sugar.     montelukast  (SINGULAIR ) 10 MG tablet Take 10 mg by mouth at bedtime.     pregabalin  (LYRICA ) 100 MG capsule Take 100 mg by mouth 2 (two) times daily.     Semaglutide, 2 MG/DOSE, (OZEMPIC, 2 MG/DOSE,) 8 MG/3ML SOPN Inject 2 mg into the  skin every Wednesday.     sitaGLIPtin-metformin (JANUMET) 50-1000 MG tablet Take 1 tablet by mouth daily.     warfarin (COUMADIN ) 10 MG tablet Take 10 mg by mouth every evening.  3   No current facility-administered medications for this visit.    REVIEW OF SYSTEMS:   All relevant systems were reviewed with the patient and are negative.  PHYSICAL EXAMINATION: ECOG PERFORMANCE STATUS: 1 - Symptomatic but completely ambulatory  Vitals:   10/26/23 1131 10/26/23 1132  BP: (!) 151/59 (!) 138/59  Pulse: 97   Resp: 20   Temp: (!) 97.3 F (36.3 C)   SpO2: 96%    Filed Weights   10/26/23 1131  Weight: 198 lb 9.6 oz (90.1 kg)    GENERAL: alert, no distress and comfortable NECK: supple, non-tender, without nodularity LYMPH:  no palpable cervical, axillary lymphadenopathy LUNGS: clear to auscultation and no wheezes, rales and with normal breathing effort HEART: regular rate & rhythm and no murmurs ABDOMEN: abdomen soft, non-tender and nondistended   LABORATORY DATA:  I have reviewed the data as listed Recent Results (from the past 2160 hours)  CBC with Differential     Status: Abnormal   Collection Time: 09/28/23  9:13 PM  Result Value Ref Range   WBC 36.1 (H) 4.0 - 10.5 K/uL   RBC 4.31 3.87 - 5.11 MIL/uL   Hemoglobin 11.9 (L) 12.0 - 15.0 g/dL   HCT 61.4 63.9 - 53.9 %   MCV 89.3 80.0 - 100.0 fL   MCH 27.6 26.0 - 34.0 pg   MCHC 30.9 30.0 - 36.0 g/dL   RDW 82.0 (H) 88.4 - 84.4 %   Platelets 303 150 -  400 K/uL    Comment: REPEATED TO VERIFY   nRBC 0.0 0.0 - 0.2 %   Neutrophils Relative % 16 %   Neutro Abs 5.8 1.7 - 7.7 K/uL   Lymphocytes Relative 64 %   Lymphs Abs 23.3 (H) 0.7 - 4.0 K/uL   Monocytes Relative 19 %   Monocytes Absolute 6.7 (H) 0.1 - 1.0 K/uL   Eosinophils Relative 1 %   Eosinophils Absolute 0.2 0.0 - 0.5 K/uL   Basophils Relative 0 %   Basophils Absolute 0.1 0.0 - 0.1 K/uL   WBC Morphology ABSOLUTE LYMPHOCYTOSIS    RBC Morphology MORPHOLOGY UNREMARKABLE     Smear Review Normal platelet morphology    Immature Granulocytes 0 %   Abs Immature Granulocytes 0.09 (H) 0.00 - 0.07 K/uL   Smudge Cells PRESENT     Comment: Performed at [facility], [address] [place], [place] [address]  Comprehensive metabolic panel     Status: Abnormal   Collection Time: 09/28/23  9:13 PM  Result Value Ref Range   Sodium 136 135 - 145 mmol/L   Potassium 5.0 3.5 - 5.1 mmol/L   Chloride 104 98 - 111 mmol/L   CO2 24 22 - 32 mmol/L   Glucose, Bld 190 (H) 70 - 99 mg/dL    Comment: Glucose reference range applies only to samples taken after fasting for at least 8 hours.   BUN 22 8 - 23 mg/dL   Creatinine, Ser 8.99 0.44 - 1.00 mg/dL   Calcium  8.5 (L) 8.9 - 10.3 mg/dL   Total Protein 6.3 (L) 6.5 - 8.1 g/dL   Albumin 3.3 (L) 3.5 - 5.0 g/dL   AST 28 15 - 41 U/L   ALT 16 0 - 44 U/L   Alkaline Phosphatase 91 38 - 126 U/L   Total Bilirubin 0.5 0.0 - 1.2 mg/dL   GFR, Estimated 57 (L) >60 mL/min    Comment: (NOTE) Calculated using the CKD-EPI Creatinine Equation (2021)    Anion gap 8 5 - 15    Comment: Performed at [facility], [address], [place], [place] [address]  Lipase, blood     Status: None   Collection Time: 09/28/23  9:13 PM  Result Value Ref Range   Lipase 37 11 - 51 U/L    Comment: Performed at [facility], [address] [place], [place] [address]  Troponin I (High Sensitivity)     Status: None   Collection Time: 09/28/23  9:13 PM  Result Value Ref Range   Troponin I (High Sensitivity) 5 <18 ng/L    Comment: (NOTE) Elevated high sensitivity troponin I (hsTnI) values and significant  changes across serial measurements may suggest ACS but many other  chronic and acute conditions are known to elevate hsTnI results.  Refer to the Links section for chest pain algorithms and additional  guidance. Performed at [facility], [address], [place], [place] [address]   Pathologist smear review     Status: None    Collection Time: 09/28/23  9:13 PM  Result Value Ref Range   Path Review       Lymphocytosis with smudge cells concerning for lymphoproliferative process    Comment:  Suggest flow cytometry if new, persistent finding Reviewed by Norleen BIRCH. Kaitlyn Jackson, M.D.  09/29/2023 Performed at [facility], [address] [place], [place] [address]   I-stat chem 8, ED (not at [facility], [facility] or [facility])  Status: Abnormal   Collection Time: 09/28/23  9:59 PM  Result Value Ref Range   Sodium 137 135 - 145 mmol/L   Potassium 5.1 3.5 - 5.1 mmol/L   Chloride 103 98 - 111 mmol/L   BUN 26 (H) 8 - 23 mg/dL   Creatinine, Ser 8.89 (H) 0.44 - 1.00 mg/dL   Glucose, Bld 813 (H) 70 - 99 mg/dL    Comment: Glucose reference range applies only to samples taken after fasting for at least 8 hours.   Calcium , Ion 1.07 (L) 1.15 - 1.40 mmol/L   TCO2 24 22 - 32 mmol/L   Hemoglobin 13.3 12.0 - 15.0 g/dL   HCT 60.9 63.9 - 53.9 %  I-Stat CG4 Lactic Acid     Status: Abnormal   Collection Time: 09/28/23  9:59 PM  Result Value Ref Range   Lactic Acid, Venous 2.9 (HH) 0.5 - 1.9 mmol/L   Comment NOTIFIED PHYSICIAN   Urinalysis, Routine w reflex microscopic -Urine, Clean Catch     Status: Abnormal   Collection Time: 09/28/23 10:50 PM  Result Value Ref Range   Color, Urine STRAW (A) YELLOW   APPearance CLEAR CLEAR   Specific Gravity, Urine 1.016 1.005 - 1.030   pH 6.0 5.0 - 8.0   Glucose, UA >=500 (A) NEGATIVE mg/dL   Hgb urine dipstick MODERATE (A) NEGATIVE   Bilirubin Urine NEGATIVE NEGATIVE   Ketones, ur NEGATIVE NEGATIVE mg/dL   Protein, ur NEGATIVE NEGATIVE mg/dL   Nitrite NEGATIVE NEGATIVE   Leukocytes,Ua TRACE (A) NEGATIVE   RBC / HPF 0-5 0 - 5 RBC/hpf   WBC, UA 0-5 0 - 5 WBC/hpf   Bacteria, UA RARE (A) NONE SEEN   Squamous Epithelial / HPF 0-5 0 - 5 /HPF   Mucus PRESENT     Comment: Performed at [facility], [address] [place], [place] [address]  Troponin I (High Sensitivity)     Status: None    Collection Time: 09/28/23 11:48 PM  Result Value Ref Range   Troponin I (High Sensitivity) 6 <18 ng/L    Comment: (NOTE) Elevated high sensitivity troponin I (hsTnI) values and significant  changes across serial measurements may suggest ACS but many other  chronic and acute conditions are known to elevate hsTnI results.  Refer to the Links section for chest pain algorithms and additional  guidance. Performed at [facility], [address] [place], [place] [address]   I-Stat CG4 Lactic Acid     Status: None   Collection Time: 09/28/23 11:56 PM  Result Value Ref Range   Lactic Acid, Venous 1.9 0.5 - 1.9 mmol/L  Basic metabolic panel     Status: Abnormal   Collection Time: 09/29/23  3:26 AM  Result Value Ref Range   Sodium 135 135 - 145 mmol/L   Potassium 5.0 3.5 - 5.1 mmol/L   Chloride 102 98 - 111 mmol/L   CO2 21 (L) 22 - 32 mmol/L   Glucose, Bld 173 (H) 70 - 99 mg/dL    Comment: Glucose reference range applies only to samples taken after fasting for at least 8 hours.   BUN 17 8 - 23 mg/dL   Creatinine, Ser 9.09 0.44 - 1.00 mg/dL   Calcium  8.2 (L) 8.9 - 10.3 mg/dL   GFR, Estimated >39 >39 mL/min    Comment: (NOTE) Calculated using the CKD-EPI Creatinine Equation (2021)    Anion gap 12 5 - 15    Comment: Performed at [facility], [address] [place], [place] [address]  CBC     Status: Abnormal   Collection Time: 09/29/23  3:26 AM  Result Value Ref Range   WBC 33.9 (H) 4.0 - 10.5 K/uL   RBC 4.06 3.87 - 5.11 MIL/uL   Hemoglobin 11.2 (L) 12.0 - 15.0 g/dL   HCT 64.3 (L) 63.9 - 53.9 %   MCV 87.7 80.0 - 100.0 fL   MCH 27.6 26.0 - 34.0 pg   MCHC 31.5 30.0 - 36.0 g/dL   RDW 81.9 (H) 88.4 - 84.4 %   Platelets 292 150 - 400 K/uL   nRBC 0.0 0.0 - 0.2 %    Comment: Performed at [facility], [address], [place], [place] [address]  Hemoglobin A1c     Status: Abnormal   Collection Time: 09/29/23  3:26 AM  Result Value Ref Range   Hgb A1c MFr Bld 8.8  (H) 4.8 - 5.6 %    Comment: (NOTE) Diagnosis of Diabetes The following HbA1c ranges recommended by the American Diabetes Association (ADA) may be used as an aid in the diagnosis of diabetes mellitus.  Hemoglobin             Suggested A1C NGSP%              Diagnosis  <5.7                   Non Diabetic  5.7-6.4                Pre-Diabetic  >6.4                   Diabetic  <7.0                   Glycemic control for                       adults with diabetes.     Mean Plasma Glucose 205.86 mg/dL    Comment: Performed at [facility], [address] [place], [place] [address]  Protime-INR     Status: Abnormal   Collection Time: 09/29/23  3:26 AM  Result Value Ref Range   Prothrombin Time 23.3 (H) 11.4 - 15.2 seconds   INR 2.0 (H) 0.8 - 1.2    Comment: (NOTE) INR goal varies based on device and disease states. Performed at [facility], [address], [place], [place] [address]   TSH     Status: None   Collection Time: 09/29/23  3:26 AM  Result Value Ref Range   TSH 1.214 0.350 - 4.500 uIU/mL    Comment: Performed by a 3rd Generation assay with a functional sensitivity of <=0.01 uIU/mL. Performed at [facility], [address], [place], [place] [address]   CK     Status: Abnormal   Collection Time: 09/29/23  3:26 AM  Result Value Ref Range   Total CK 2,302 (H) 38 - 234 U/L    Comment: Performed at [facility], [address], [place], [place] [address]  CBG monitoring, ED     Status: Abnormal   Collection Time: 09/29/23  7:23 AM  Result Value Ref Range   Glucose-Capillary 124 (H) 70 - 99 mg/dL    Comment: Glucose reference range applies only to samples taken after fasting for at least 8 hours.  CBG monitoring, ED     Status: Abnormal  Collection Time: 09/29/23 11:40 AM  Result Value Ref Range   Glucose-Capillary 162 (H) 70 - 99 mg/dL    Comment: Glucose reference range applies only to samples taken after fasting for at least 8 hours.   ECHOCARDIOGRAM COMPLETE     Status: None   Collection Time: 09/29/23  2:03 PM  Result Value Ref Range   Weight 3,264 oz   Height 63 in   BP 130/55 mmHg   Single Plane A2C EF 61.0 %   Single Plane A4C EF 59.1 %   Calc EF 59.6 %   S' Lateral 3.00 cm   Area-P 1/2 4.04 cm2   Est EF 55 - 60%   CBG monitoring, ED     Status: Abnormal   Collection Time: 09/29/23  5:13 PM  Result Value Ref Range   Glucose-Capillary 155 (H) 70 - 99 mg/dL    Comment: Glucose reference range applies only to samples taken after fasting for at least 8 hours.  Surgical pathology     Status: None   Collection Time: 09/30/23 12:00 AM  Result Value Ref Range   SURGICAL PATHOLOGY      Surgical Pathology CASE: [id number] PATIENT: Rhyleigh Brossart Flow Pathology Report     Clinical history: Lymphocytosis     DIAGNOSIS:  - Abnormal clonal B-cell population identified.  See comment.  COMMENT: Flow cytometric analysis identified a clonal B-cell population constituting 91% of lymphocytes (19,800 cells per microliter).  The B cells are positive for CD5, CD19, CD20, CD38, CD200 and express kappa light chains.  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  Correlation with potential sites of lymphadenopathy, splenomegaly and cytogenetics may be performed as clinically indicated.   GATING AND PHENOTYPIC ANALYSIS:  Gated population: Flow cytometric immunophenotyping is performed using antibodies to the antigens listed in the table below. Electronic gates are placed around a cell cluster displaying light scatter properties corresponding to: Lymphocytes  Abnormal Cells in gated population: 91%  P henotype of Abnormal Cells: CD5, CD19, CD20, CD38, CD200, Kappa.                       Lymphoid Antigens       Myeloid Antigens Miscellaneous CD2  NEG  CD10 NEG  CD11b     ND   CD45 POS CD3  NEG  CD19 POS  CD11c     ND   HLA-Dr    ND CD4  NEG  CD20 POS  CD13  ND   CD34 NEG CD5  POS  CD22 ND   CD14 ND   CD38 POS CD7  NEG  CD79b     ND   CD15 ND   CD138     ND CD8  NEG  CD103     ND   CD16 ND   TdT  ND CD25 ND   CD200     POS  CD33 ND   CD123     ND TCRab     ND   sKappa    POS  CD64 ND   CD41 ND TCRgd     NEG  sLambda   NEG  CD117     ND   CD61 ND CD56 NEG  cKappa    ND   MPO  ND   CD71 ND CD57 ND   cLambda   ND        [id number]     GROSS DESCRIPTION:  One lavender top tube submitted from [facility] for lymphoma testing.    Final Diagnosis performed by Ilsa Pottier, MD.   Electronically signed 09/30/2023 Technical and / or Professional components performed at [facility], [address] [place], [place] [address].  The above tes ts were developed and their performance characteristics determined by the [facility] system for the physical and immunophenotypic characterization of cell populations. They have not been cleared by the U.S. Food and Drug administration. The  FDA has determined that such clearance or approval is not necessary. This test is used for clinical purposes. It should not be  regarded as investigational or for research   Comprehensive metabolic panel     Status: Abnormal   Collection Time: 09/30/23  4:17 AM  Result Value Ref Range   Sodium 137 135 - 145 mmol/L   Potassium 5.0 3.5 - 5.1 mmol/L   Chloride 103 98 - 111 mmol/L   CO2 26 22 - 32 mmol/L   Glucose, Bld 149 (H) 70 - 99 mg/dL    Comment: Glucose reference range applies only to samples taken after fasting for at least 8 hours.   BUN 13 8 - 23 mg/dL   Creatinine, Ser 9.06 0.44 - 1.00 mg/dL   Calcium  8.8 (L) 8.9 - 10.3 mg/dL   Total Protein 5.9 (L) 6.5 - 8.1 g/dL   Albumin 2.8 (L) 3.5 - 5.0 g/dL   AST 52 (H) 15 - 41 U/L   ALT 24 0 - 44 U/L   Alkaline Phosphatase 80 38 - 126 U/L   Total Bilirubin 0.9 0.0 - 1.2 mg/dL   GFR, Estimated >39 >39 mL/min    Comment: (NOTE) Calculated using the CKD-EPI Creatinine Equation (2021)     Anion gap 8 5 - 15    Comment: Performed at [facility], [address] [place], [place] [address]  CK     Status: Abnormal   Collection Time: 09/30/23  4:17 AM  Result Value Ref Range   Total CK 1,195 (H) 38 - 234 U/L    Comment: Performed at [facility], [address] [place], [place] [address]  CBC with Differential/Platelet     Status: Abnormal   Collection Time: 09/30/23  4:17 AM  Result Value Ref Range   WBC 43.0 (H) 4.0 - 10.5 K/uL   RBC 4.14 3.87 - 5.11 MIL/uL   Hemoglobin 11.4 (L) 12.0 - 15.0 g/dL   HCT 63.4 63.9 - 53.9 %   MCV 88.2 80.0 - 100.0 fL   MCH 27.5 26.0 - 34.0 pg   MCHC 31.2 30.0 - 36.0 g/dL   RDW 81.8 (H) 88.4 - 84.4 %   Platelets 293 150 - 400 K/uL   nRBC 0.0 0.0 - 0.2 %   Neutrophils Relative % 31 %   Neutro Abs 13.3 (H) 1.7 - 7.7 K/uL   Lymphocytes Relative 56 %   Lymphs Abs 24.1 (H) 0.7 - 4.0 K/uL   Monocytes Relative 12 %   Monocytes Absolute 5.2 (H) 0.1 - 1.0 K/uL   Eosinophils Relative 0 %   Eosinophils Absolute 0.0 0.0 - 0.5 K/uL   Basophils Relative 1 %   Basophils Absolute 0.4 (H) 0.0 - 0.1 K/uL   WBC Morphology ABSOLUTE LYMPHOCYTOSIS     Comment: SMUDGE CELLS   RBC Morphology MORPHOLOGY UNREMARKABLE    Smear Review MORPHOLOGY UNREMARKABLE    nRBC 0 0 /100 WBC   Immature Granulocytes 0 %   Abs Immature Granulocytes 0.09 (H)  0.00 - 0.07 K/uL    Comment: Performed at [facility], [address] [place], [place] [address]  CBG monitoring, ED     Status: Abnormal   Collection Time: 09/30/23  5:42 AM  Result Value Ref Range   Glucose-Capillary 146 (H) 70 - 99 mg/dL    Comment: Glucose reference range applies only to samples taken after fasting for at least 8 hours.  CBG monitoring, ED     Status: Abnormal   Collection Time: 09/30/23  8:07 AM  Result Value Ref Range   Glucose-Capillary 140 (H) 70 - 99 mg/dL    Comment: Glucose reference range applies only to samples taken after fasting for at least 8 hours.     RADIOGRAPHIC STUDIES: I have personally reviewed the radiological images as listed and agreed with the findings in the report. ECHOCARDIOGRAM COMPLETE Result Date: 09/29/2023    ECHOCARDIOGRAM REPORT   Patient Name:   Kaitlyn Jackson Date of Exam: 09/29/2023 Medical Rec #:  [id number]          Height:       63.0 in Accession #:    [id number]         Weight:       204.0 lb Date of Birth:  1945-04-20          BSA:          1.950 m Patient Age:    78 years           BP:           149/68 mmHg Patient Gender: F                  HR:           80 bpm. Exam Location:  Inpatient Procedure: 2D Echo, Cardiac Doppler, Color Doppler and Intracardiac            Opacification Agent (Both Spectral and Color Flow Doppler were            utilized during procedure). Indications:    Syncope  History:        Patient has no prior history of Echocardiogram examinations.                 Risk Factors:Diabetes and Sleep Apnea.  Sonographer:    Therisa Crouch Referring Phys: [phone] CLAUDIA CLAIBORNE IMPRESSIONS  1. Left ventricular ejection fraction, by estimation, is 55 to 60%. The left ventricle has normal function. The left ventricle has no regional wall motion abnormalities. There is mild concentric left ventricular hypertrophy. Left ventricular diastolic parameters are indeterminate.  2. Right ventricular systolic function is normal. The right ventricular size is normal. Tricuspid regurgitation signal is inadequate for assessing PA pressure.  3. The mitral valve is normal in structure. No evidence of mitral valve regurgitation. No evidence of mitral stenosis.  4. The aortic valve is normal in structure. Aortic valve regurgitation is not visualized. No aortic stenosis is present.  5. The inferior vena cava is normal in size with greater than 50% respiratory variability, suggesting right atrial pressure of 3 mmHg. FINDINGS  Left Ventricle: Left ventricular ejection fraction, by estimation, is 55 to 60%. The left ventricle has normal  function. The left ventricle has no regional wall motion abnormalities. The left ventricular internal cavity size was normal in size. There is  mild concentric left ventricular hypertrophy. Left ventricular diastolic parameters are indeterminate. Right Ventricle: The right ventricular size is normal. No increase in right ventricular  wall thickness. Right ventricular systolic function is normal. Tricuspid regurgitation signal is inadequate for assessing PA pressure. Left Atrium: Left atrial size was normal in size. Right Atrium: Right atrial size was normal in size. Pericardium: There is no evidence of pericardial effusion. Mitral Valve: The mitral valve is normal in structure. No evidence of mitral valve regurgitation. No evidence of mitral valve stenosis. Tricuspid Valve: The tricuspid valve is normal in structure. Tricuspid valve regurgitation is not demonstrated. No evidence of tricuspid stenosis. Aortic Valve: The aortic valve is normal in structure. Aortic valve regurgitation is not visualized. No aortic stenosis is present. Pulmonic Valve: The pulmonic valve was normal in structure. Pulmonic valve regurgitation is not visualized. No evidence of pulmonic stenosis. Aorta: The aortic root is normal in size and structure. Venous: The inferior vena cava is normal in size with greater than 50% respiratory variability, suggesting right atrial pressure of 3 mmHg. IAS/Shunts: No atrial level shunt detected by color flow Doppler.  LEFT VENTRICLE PLAX 2D LVIDd:         4.30 cm     Diastology LVIDs:         3.00 cm     LV e' medial:    7.83 cm/s LV PW:         1.10 cm     LV E/e' medial:  6.4 LV IVS:        1.00 cm     LV e' lateral:   9.57 cm/s LVOT diam:     1.90 cm     LV E/e' lateral: 5.2 LVOT Area:     2.84 cm  LV Volumes (MOD) LV vol d, MOD A2C: 73.1 ml LV vol d, MOD A4C: 72.9 ml LV vol s, MOD A2C: 28.5 ml LV vol s, MOD A4C: 29.8 ml LV SV MOD A2C:     44.6 ml LV SV MOD A4C:     72.9 ml LV SV MOD BP:      44.0 ml  RIGHT VENTRICLE RV Basal diam:  3.20 cm RV S prime:     10.90 cm/s TAPSE (M-mode): 2.6 cm LEFT ATRIUM             Index LA diam:        3.70 cm 1.90 cm/m LA Vol (A2C):   75.4 ml 38.67 ml/m LA Vol (A4C):   50.7 ml 26.00 ml/m LA Biplane Vol: 62.0 ml 31.80 ml/m   AORTA Ao Root diam: 3.00 cm Ao Asc diam:  3.00 cm MITRAL VALVE MV Area (PHT): 4.04 cm     SHUNTS MV Decel Time: 188 msec     Systemic Diam: 1.90 cm MV E velocity: 50.10 cm/s MV A velocity: 105.00 cm/s MV E/A ratio:  0.48 Kardie Tobb DO Electronically signed by Dub Huntsman DO Signature Date/Time: 09/29/2023/2:49:21 PM    Final    CT ABDOMEN PELVIS WO CONTRAST Result Date: 09/28/2023 CLINICAL DATA:  Generalized abdominal pain following fall, initial encounter EXAM: CT ABDOMEN AND PELVIS WITHOUT CONTRAST TECHNIQUE: Multidetector CT imaging of the abdomen and pelvis was performed following the standard protocol without IV contrast. RADIATION DOSE REDUCTION: This exam was performed according to the departmental dose-optimization program which includes automated exposure control, adjustment of the mA and/or kV according to patient size and/or use of iterative reconstruction technique. COMPARISON:  None Available. FINDINGS: Lower chest: No acute abnormality. Hepatobiliary: Liver is within normal limits. Gallbladder is well distended with multiple dependent stones extending into the gallbladder neck. No complicating factors are noted.  Pancreas: Unremarkable. No pancreatic ductal dilatation or surrounding inflammatory changes. Spleen: Normal in size without focal abnormality. Adrenals/Urinary Tract: Adrenal glands are within normal limits. Kidneys are well visualized bilaterally without renal calculi or obstructive changes. The bladder is well distended. Stomach/Bowel: Scattered mild diverticular changes noted without evidence of diverticulitis. Fecal material is noted throughout the colon without obstructive change. The appendix is unremarkable. Small bowel and  stomach are within normal limits. Vascular/Lymphatic: Aortic atherosclerosis. No enlarged abdominal or pelvic lymph nodes. Reproductive: Calcified uterine fibroids are noted. No adnexal mass is seen. Other: No abdominal wall hernia or abnormality. No abdominopelvic ascites. Musculoskeletal: Degenerative changes are noted worst at T11-T12. IMPRESSION: Cholelithiasis without complicating factors. Diverticulosis without diverticulitis. Electronically Signed   By: Oneil Devonshire M.D.   On: 09/28/2023 21:52   CT Head Wo Contrast Result Date: 09/28/2023 EXAM: CT HEAD WITHOUT CONTRAST 09/28/2023 09:41:30 PM TECHNIQUE: CT of the head was performed without the administration of intravenous contrast. Automated exposure control, iterative reconstruction, and/or weight based adjustment of the mA/kV was utilized to reduce the radiation dose to as low as reasonably achievable. COMPARISON: 02/14/2022 CLINICAL HISTORY: Head trauma, moderate-severe. FINDINGS: BRAIN AND VENTRICLES: No acute hemorrhage. Gray-white differentiation is preserved. No hydrocephalus. No extra-axial collection. No mass effect or midline shift. ORBITS: No acute abnormality. SINUSES: Interval increase in the left mastoid effusion. SOFT TISSUES AND SKULL: No acute soft tissue abnormality. No skull fracture. IMPRESSION: 1. No acute intracranial abnormality. Electronically signed by: Norman Gatlin MD 09/28/2023 09:49 PM EDT RP Workstation: [id number]   CT Cervical Spine Wo Contrast Result Date: 09/28/2023 CLINICAL DATA:  Recent fall with neck pain, initial encounter EXAM: CT CERVICAL SPINE WITHOUT CONTRAST TECHNIQUE: Multidetector CT imaging of the cervical spine was performed without intravenous contrast. Multiplanar CT image reconstructions were also generated. RADIATION DOSE REDUCTION: This exam was performed according to the departmental dose-optimization program which includes automated exposure control, adjustment of the mA and/or kV according to  patient size and/or use of iterative reconstruction technique. COMPARISON:  None Available. FINDINGS: Alignment: Straightening of the normal cervical lordosis is noted likely related to muscular spasm. Skull base and vertebrae: 7 cervical segments are well visualized. Vertebral body height is well maintained. Multilevel osteophytic changes and facet hypertrophic changes are noted. No acute fracture or acute facet abnormality is noted. Note is made of left mastoid effusion. Soft tissues and spinal canal: Surrounding soft tissue structures are within normal limits. Upper chest: Visualized lung apices are unremarkable. Other: None IMPRESSION: Multilevel degenerative change without acute abnormality. Electronically Signed   By: Oneil Devonshire M.D.   On: 09/28/2023 21:48   DG Pelvis Portable Result Date: 09/28/2023 CLINICAL DATA:  Trauma, fall. EXAM: PORTABLE PELVIS 1-2 VIEWS COMPARISON:  None Available. FINDINGS: There is no evidence of pelvic fracture or diastasis. No pelvic bone lesions are seen. There are mild degenerative changes of both hips. IMPRESSION: No acute fracture or dislocation. Mild degenerative changes of both hips. Electronically Signed   By: Greig Pique M.D.   On: 09/28/2023 21:36   DG Chest Port 1 View Result Date: 09/28/2023 CLINICAL DATA:  Recent fall with chest pain, initial encounter EXAM: PORTABLE CHEST 1 VIEW COMPARISON:  10/17/2019 FINDINGS: Cardiac shadow is mildly prominent but accentuated by the portable technique. Aortic calcifications are noted. The lungs are well aerated bilaterally. No acute bony abnormality is seen. IMPRESSION: No acute abnormality noted. Electronically Signed   By: Oneil Devonshire M.D.   On: 09/28/2023 21:35

## 2023-10-25 NOTE — Assessment & Plan Note (Addendum)
 Obtain CG and CBC Follow up in 2 to 3 weeks to go over results.

## 2023-10-26 ENCOUNTER — Inpatient Hospital Stay

## 2023-10-26 VITALS — BP 138/59 | HR 97 | Temp 97.3°F | Resp 20 | Wt 198.6 lb

## 2023-10-26 DIAGNOSIS — E119 Type 2 diabetes mellitus without complications: Secondary | ICD-10-CM | POA: Diagnosis not present

## 2023-10-26 DIAGNOSIS — Z800 Family history of malignant neoplasm of digestive organs: Secondary | ICD-10-CM | POA: Insufficient documentation

## 2023-10-26 DIAGNOSIS — Z7901 Long term (current) use of anticoagulants: Secondary | ICD-10-CM | POA: Diagnosis not present

## 2023-10-26 DIAGNOSIS — Z86718 Personal history of other venous thrombosis and embolism: Secondary | ICD-10-CM | POA: Diagnosis not present

## 2023-10-26 DIAGNOSIS — Z79899 Other long term (current) drug therapy: Secondary | ICD-10-CM | POA: Insufficient documentation

## 2023-10-26 DIAGNOSIS — E538 Deficiency of other specified B group vitamins: Secondary | ICD-10-CM | POA: Insufficient documentation

## 2023-10-26 DIAGNOSIS — Z801 Family history of malignant neoplasm of trachea, bronchus and lung: Secondary | ICD-10-CM | POA: Insufficient documentation

## 2023-10-26 DIAGNOSIS — C9110 Chronic lymphocytic leukemia of B-cell type not having achieved remission: Secondary | ICD-10-CM | POA: Diagnosis not present

## 2023-10-26 DIAGNOSIS — D649 Anemia, unspecified: Secondary | ICD-10-CM

## 2023-10-26 DIAGNOSIS — E1169 Type 2 diabetes mellitus with other specified complication: Secondary | ICD-10-CM | POA: Diagnosis not present

## 2023-10-26 LAB — CBC WITH DIFFERENTIAL (CANCER CENTER ONLY)
Basophils Absolute: 0 K/uL (ref 0.0–0.1)
Basophils Relative: 0 %
Eosinophils Absolute: 0.6 K/uL — ABNORMAL HIGH (ref 0.0–0.5)
Eosinophils Relative: 2 %
HCT: 40.7 % (ref 36.0–46.0)
Hemoglobin: 12.8 g/dL (ref 12.0–15.0)
Lymphocytes Relative: 70 %
Lymphs Abs: 19.8 K/uL — ABNORMAL HIGH (ref 0.7–4.0)
MCH: 27.9 pg (ref 26.0–34.0)
MCHC: 31.4 g/dL (ref 30.0–36.0)
MCV: 88.9 fL (ref 80.0–100.0)
Monocytes Absolute: 0.8 K/uL (ref 0.1–1.0)
Monocytes Relative: 3 %
Neutro Abs: 7.1 K/uL (ref 1.7–7.7)
Neutrophils Relative %: 25 %
Platelet Count: 237 K/uL (ref 150–400)
RBC: 4.58 MIL/uL (ref 3.87–5.11)
RDW: 17.7 % — ABNORMAL HIGH (ref 11.5–15.5)
Smear Review: NORMAL
WBC Count: 28.3 K/uL — ABNORMAL HIGH (ref 4.0–10.5)
nRBC: 0 % (ref 0.0–0.2)

## 2023-10-26 LAB — FERRITIN: Ferritin: 132 ng/mL (ref 11–307)

## 2023-10-26 LAB — FOLATE: Folate: 11.8 ng/mL (ref >5.9)

## 2023-10-26 LAB — VITAMIN B12: Vitamin B-12: 164 pg/mL — ABNORMAL LOW (ref 180–914)

## 2023-10-26 LAB — LACTATE DEHYDROGENASE: LDH: 374 U/L — ABNORMAL HIGH (ref 98–192)

## 2023-10-26 NOTE — Assessment & Plan Note (Addendum)
 Only had it once in 1996 and has been placed on warfarin since without monitoring. Patient was advised by previous provider to continue lifelong anticoagulation.

## 2023-10-28 ENCOUNTER — Ambulatory Visit: Payer: Self-pay

## 2023-10-29 ENCOUNTER — Other Ambulatory Visit: Payer: Self-pay

## 2023-10-29 ENCOUNTER — Telehealth: Payer: Self-pay

## 2023-10-29 NOTE — Patient Instructions (Signed)
 Visit Information  Thank you for taking time to visit with me today. Please don't hesitate to contact me if I can be of assistance to you before our next scheduled telephone appointment.  Our next appointment is CCM nurse in about 30 days  Following is a copy of your care plan:   Goals Addressed             This Visit's Progress    VBCI Transitions of Care (TOC) Care Plan       Problems: (Reviewed with patient and updated 10/29/23) Recent Hospitalization for treatment of DMII and Fall; new Leukocytosis No Specialist appointment Patient has left messages and this Clinical research associate as well per AVS notes for [facility] 10/12/23 Appointment made for October 26, 2023 Patient states ED provider voiced concerns for Metformin, Janumet and Ozempic to discuss with PCP at appointment scheduled for today post hospital follow up (reviewed for Metformin and currently discontinued) Patient continue to monitor with [place], some stomach upset with gas, patient on Ozempic, also  Goal:  Over the next 30 days, the patient will not experience hospital readmission  Interventions:   Falls Interventions: Reviewed medications and discussed potential side effects of medications such as dizziness and frequent urination Advised patient of importance of notifying provider of falls Assessed for falls since last encounter Screening for signs and symptoms of depression related to chronic disease state  Assessed social determinant of health barriers  Patient Self Care Activities:  Attend all scheduled provider appointments Attend church or other social activities Call pharmacy for medication refills 3-7 days in advance of running out of medications Call provider office for new concerns or questions  Notify RN Care Manager of TOC call rescheduling needs Participate in Transition of Care Program/Attend TOC scheduled calls Weight loss noted on Ozempic, monitoring blood sugars through [place], encourage eating more  fresh fruits and vegetables.. Attending family functions and driving without issues  Plan:  An initial telephone outreach has been scheduled for:  The care management team will reach out to the patient again over the next 7 - 10 business days. Discuss diabetes medication at PCP appointment - 10/12/23 patient states PCP aware and following Follow up with Hematology/Cancer Center appointment from messages left for new patient appointment need. 10/12/23 appointment made and following for any additional needs.  10/29/23 Patient to follow up with PCP regarding PT/INR with Coumadin  Continue current plan, monitor diet states sometimes during night blood sugar drops around 60 and Libre alerts and takes a snack, most of time blood sugars in the 130 - 150's.  10/29/23 having some gas discomforts from time to time, patient taking Gas X on occasion.  States she weighed 194 lbs appetite still low.  Encouraged to eat smaller meals         Patient verbalizes understanding of instructions and care plan provided today and agrees to view in MyChart. Active MyChart status and patient understanding of how to access instructions and care plan via MyChart confirmed with patient.     The patient has been provided with contact information for the care management team and has been advised to call with any health related questions or concerns.  The care management team will reach out to the patient again over the next 30 days.  The [facility] team will follow up with the patient and will provide direct communication to the PCP for this patient.   Please call the care guide team at [phone] if you need to cancel or reschedule your  appointment.   Please call the [place]  National Suicide Prevention Lifeline: 681-341-0723 or TTY: 581-179-7019 TTY [phone]) to talk to a trained counselor if you are experiencing a Mental Health or Behavioral Health Crisis or need someone to talk to.  Richerd Fish, RN, BSN,  CCM [facility], [facility] RN Care Manager Direct Dial: [phone]

## 2023-10-29 NOTE — Transitions of Care (Post Inpatient/ED Visit) (Unsigned)
 Transition of Care week 4  Visit Note  10/29/2023  Name: Kaitlyn Jackson MRN: [id number]          DOB: Nov 29, 1945  Situation: Patient enrolled in [facility] 30-day program. Visit completed with patient by telephone.   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 since 1996   Insulin  dependent diabetes mellitus    Mitral valve prolapse    Neuropathy    Osteoarthritis    bilateral knee   Seasonal allergies    Sleep apnea    no CPAP use in several months, per pt.   Systemic lupus erythematosus (HCC)    Trigger finger, left middle finger 01/2017    Assessment: Patient Reported Symptoms: Cognitive Cognitive Status: Alert and oriented to person, place, and time, Normal speech and language skills Cognitive/Intellectual Conditions Management [RPT]: None reported or documented in medical history or problem list      Neurological Neurological Review of Symptoms: No symptoms reported Neurological Management Strategies: Activity, Medication therapy Neurological Self-Management Outcome: 4 (good)  HEENT HEENT Symptoms Reported: No symptoms reported HEENT Management Strategies: Activity, Adequate rest, Medication therapy HEENT Self-Management Outcome: 4 (good)    Cardiovascular Cardiovascular Symptoms Reported: No symptoms reported Cardiovascular Management Strategies: Activity, Adequate rest, Medication therapy Weight: 194 lb (88 kg) Cardiovascular Self-Management Outcome: 4 (good) Cardiovascular Comment: no problems  Respiratory  Respiratory Symptoms Reported: No symptoms reported    Endocrine Endocrine Symptoms Reported: No symptoms reported Is patient diabetic?: Yes Is patient checking blood sugars at home?: Yes List most recent blood sugar readings, include date and time of day: 126 [place]) Endocrine Self-Management Outcome: 4 (good)  Gastrointestinal Gastrointestinal Symptoms Reported: Other, Flatulence Other Gastrointestinal Symptoms: on Ozempic Gastrointestinal Self-Management Outcome: 4 (good)    Genitourinary Genitourinary Symptoms Reported: No symptoms reported Genitourinary Management Strategies: Medication therapy Genitourinary Self-Management Outcome: 4 (good)  Integumentary Integumentary Symptoms Reported: No symptoms reported Skin Management Strategies: Activity, Adequate rest Skin Self-Management Outcome: 4 (good) Skin Comment: Encouraging diabetic foot exams and daily foot care - ongoing  Musculoskeletal Musculoskelatal Symptoms Reviewed: Other Other Musculoskeletal Symptoms: stiffness hx of osteoarthritis, carpal tunnel, trigger fingers Musculoskeletal Management Strategies: Activity, Adequate rest, Exercise, Medication therapy Musculoskeletal Self-Management Outcome: 4 (good) Musculoskeletal Comment: better Falls in the past year?: Yes Number of falls in past year: 1 or less Was there an injury with Fall?: Yes Fall Risk Category Calculator: 2 Patient Fall Risk Level: Moderate Fall Risk Patient at Risk for Falls Due to: History of fall(s)  Psychosocial Psychosocial Symptoms Reported: No symptoms reported Behavioral Management Strategies: Activity, Adequate rest, Support system, Medication therapy Behavioral Health Self-Management Outcome: 4 (good) Behavioral Health Comment: Takes care of her great granddaughter       There were no vitals filed for this visit.  Medications Reviewed Today   Medications were not reviewed in this encounter     Recommendation:   PCP Follow-up Specialty  provider follow-up Oncology - 11/11/23  Follow Up  Plan:   Referral to RN Case Manager Patient has met all care management goals. Care Management case will be closed. Patient has been provided contact information should new needs arise.   Richerd Fish, RN, BSN, CCM [facility], [facility] RN Care Manager Direct Dial: [phone]

## 2023-11-01 LAB — METHYLMALONIC ACID, SERUM: Methylmalonic Acid, Quantitative: 220 nmol/L (ref 0–378)

## 2023-11-02 ENCOUNTER — Encounter (INDEPENDENT_AMBULATORY_CARE_PROVIDER_SITE_OTHER): Admitting: Ophthalmology

## 2023-11-03 ENCOUNTER — Encounter (INDEPENDENT_AMBULATORY_CARE_PROVIDER_SITE_OTHER): Admitting: Ophthalmology

## 2023-11-03 DIAGNOSIS — Z794 Long term (current) use of insulin: Secondary | ICD-10-CM | POA: Diagnosis not present

## 2023-11-03 DIAGNOSIS — H35033 Hypertensive retinopathy, bilateral: Secondary | ICD-10-CM

## 2023-11-03 DIAGNOSIS — E113311 Type 2 diabetes mellitus with moderate nonproliferative diabetic retinopathy with macular edema, right eye: Secondary | ICD-10-CM

## 2023-11-03 DIAGNOSIS — H43813 Vitreous degeneration, bilateral: Secondary | ICD-10-CM | POA: Diagnosis not present

## 2023-11-03 DIAGNOSIS — E113392 Type 2 diabetes mellitus with moderate nonproliferative diabetic retinopathy without macular edema, left eye: Secondary | ICD-10-CM

## 2023-11-03 DIAGNOSIS — I10 Essential (primary) hypertension: Secondary | ICD-10-CM | POA: Diagnosis not present

## 2023-11-05 LAB — FISH HES LEUKEMIA, 4Q12 REA

## 2023-11-10 DIAGNOSIS — E538 Deficiency of other specified B group vitamins: Secondary | ICD-10-CM | POA: Insufficient documentation

## 2023-11-10 DIAGNOSIS — E1169 Type 2 diabetes mellitus with other specified complication: Secondary | ICD-10-CM | POA: Diagnosis not present

## 2023-11-10 DIAGNOSIS — Z7901 Long term (current) use of anticoagulants: Secondary | ICD-10-CM | POA: Diagnosis not present

## 2023-11-10 NOTE — Assessment & Plan Note (Deleted)
 CBC, CMP, LDH in 3 months

## 2023-11-10 NOTE — Progress Notes (Unsigned)
 [facility] OFFICE PROGRESS NOTE  Patient Care Team: Benjamine Aland, MD as PCP - General (Family Medicine)  78 y.o. female with history of DM2, OSA, lupus, PE on warfarin being seen for lymphocytosis.   Flow cytometry showed CLL. Borderline anemia though she is 78 yo and normal platelet.   Report of fatigue.  B12 is low.  Recommend start B12 supplement. Assessment & Plan CLL (chronic lymphocytic leukemia) (HCC) CBC, CMP, LDH in 3 months B12 deficiency Start b12 1000mcg daily Repeat labs in 3 months  Orders Placed This Encounter  Procedures   CMP (Cancer Center only)    Standing Status:   Future    Expiration Date:   11/10/2024   CBC with Differential (Cancer Center Only)    Standing Status:   Future    Expiration Date:   11/10/2024   Lactate dehydrogenase    Standing Status:   Future    Expiration Date:   11/10/2024   Vitamin B12    Standing Status:   Future    Expiration Date:   11/10/2024     Pauletta JAYSON Chihuahua, MD  INTERVAL HISTORY: Patient returns for follow-up.  No clinical changes.  No night sweats, weight loss, lymphadenopathy, or other new symptoms.  Oncology History  CLL (chronic lymphocytic leukemia) (HCC)  10/25/2023 Initial Diagnosis   CLL (chronic lymphocytic leukemia) (HCC)   10/25/2023 Cancer Staging   Staging form: Chronic Lymphocytic Leukemia / Small Lymphocytic Lymphoma, AJCC 8th Edition - Clinical: Modified Rai Stage 0 (Modified Rai risk: Low, Lymphocytosis: Present, Adenopathy: Absent, Organomegaly: Absent, Anemia: Absent, Thrombocytopenia: Absent) - Signed by Chihuahua Pauletta JAYSON, MD on 10/25/2023 Stage prefix: Initial diagnosis   10/26/2023 Pathology Results   FISH 64.5% OF NUCLEI POSITIVE FOR TRISOMY 12       PHYSICAL EXAMINATION: ECOG PERFORMANCE STATUS: 2 - Symptomatic, <50% confined to bed  Vitals:   11/11/23 1605  BP: 125/64  Pulse: 80  Resp: 18  Temp: (!) 97.3 F (36.3 C)  SpO2: 96%   Filed Weights   11/11/23 1605  Weight:  196 lb 12.8 oz (89.3 kg)    GENERAL: alert, no distress and comfortable  Relevant data reviewed during this visit included recent lab and cytogenetic testing.  New labs ordered.

## 2023-11-10 NOTE — Assessment & Plan Note (Addendum)
Start b12 1000 mcg daily

## 2023-11-11 ENCOUNTER — Inpatient Hospital Stay (HOSPITAL_BASED_OUTPATIENT_CLINIC_OR_DEPARTMENT_OTHER)

## 2023-11-11 VITALS — BP 125/64 | HR 80 | Temp 97.3°F | Resp 18 | Wt 196.8 lb

## 2023-11-11 DIAGNOSIS — E538 Deficiency of other specified B group vitamins: Secondary | ICD-10-CM | POA: Diagnosis not present

## 2023-11-11 DIAGNOSIS — Z79899 Other long term (current) drug therapy: Secondary | ICD-10-CM | POA: Diagnosis not present

## 2023-11-11 DIAGNOSIS — Z86718 Personal history of other venous thrombosis and embolism: Secondary | ICD-10-CM | POA: Diagnosis not present

## 2023-11-11 DIAGNOSIS — E119 Type 2 diabetes mellitus without complications: Secondary | ICD-10-CM | POA: Diagnosis not present

## 2023-11-11 DIAGNOSIS — Z7901 Long term (current) use of anticoagulants: Secondary | ICD-10-CM | POA: Diagnosis not present

## 2023-11-11 DIAGNOSIS — C9110 Chronic lymphocytic leukemia of B-cell type not having achieved remission: Secondary | ICD-10-CM

## 2023-11-11 DIAGNOSIS — Z800 Family history of malignant neoplasm of digestive organs: Secondary | ICD-10-CM | POA: Diagnosis not present

## 2023-11-11 DIAGNOSIS — Z801 Family history of malignant neoplasm of trachea, bronchus and lung: Secondary | ICD-10-CM | POA: Diagnosis not present

## 2023-11-11 DIAGNOSIS — D649 Anemia, unspecified: Secondary | ICD-10-CM | POA: Diagnosis not present

## 2023-11-11 NOTE — Assessment & Plan Note (Signed)
 CBC, CMP, LDH in 3 months

## 2023-11-12 ENCOUNTER — Telehealth: Payer: Self-pay

## 2023-11-12 NOTE — Telephone Encounter (Signed)
 I spoke with Kaitlyn Jackson and she is aware of her follow up appointment scheduled for 11/13.

## 2023-11-26 DIAGNOSIS — M3210 Systemic lupus erythematosus, organ or system involvement unspecified: Secondary | ICD-10-CM | POA: Diagnosis not present

## 2023-11-26 DIAGNOSIS — E1142 Type 2 diabetes mellitus with diabetic polyneuropathy: Secondary | ICD-10-CM | POA: Diagnosis not present

## 2023-11-26 DIAGNOSIS — Z86718 Personal history of other venous thrombosis and embolism: Secondary | ICD-10-CM | POA: Diagnosis not present

## 2023-11-26 DIAGNOSIS — I10 Essential (primary) hypertension: Secondary | ICD-10-CM | POA: Diagnosis not present

## 2023-11-26 DIAGNOSIS — E1165 Type 2 diabetes mellitus with hyperglycemia: Secondary | ICD-10-CM | POA: Diagnosis not present

## 2023-11-26 DIAGNOSIS — D511 Vitamin B12 deficiency anemia due to selective vitamin B12 malabsorption with proteinuria: Secondary | ICD-10-CM | POA: Diagnosis not present

## 2023-11-30 ENCOUNTER — Encounter (INDEPENDENT_AMBULATORY_CARE_PROVIDER_SITE_OTHER): Admitting: Ophthalmology

## 2023-11-30 DIAGNOSIS — I10 Essential (primary) hypertension: Secondary | ICD-10-CM

## 2023-11-30 DIAGNOSIS — E113392 Type 2 diabetes mellitus with moderate nonproliferative diabetic retinopathy without macular edema, left eye: Secondary | ICD-10-CM | POA: Diagnosis not present

## 2023-11-30 DIAGNOSIS — Z794 Long term (current) use of insulin: Secondary | ICD-10-CM

## 2023-11-30 DIAGNOSIS — H35033 Hypertensive retinopathy, bilateral: Secondary | ICD-10-CM

## 2023-11-30 DIAGNOSIS — H33303 Unspecified retinal break, bilateral: Secondary | ICD-10-CM

## 2023-11-30 DIAGNOSIS — H43813 Vitreous degeneration, bilateral: Secondary | ICD-10-CM

## 2023-11-30 DIAGNOSIS — E113311 Type 2 diabetes mellitus with moderate nonproliferative diabetic retinopathy with macular edema, right eye: Secondary | ICD-10-CM | POA: Diagnosis not present

## 2023-12-08 ENCOUNTER — Ambulatory Visit (INDEPENDENT_AMBULATORY_CARE_PROVIDER_SITE_OTHER): Admitting: Podiatry

## 2023-12-08 ENCOUNTER — Encounter: Payer: Self-pay | Admitting: Podiatry

## 2023-12-08 VITALS — Ht 63.0 in | Wt 196.8 lb

## 2023-12-08 DIAGNOSIS — B351 Tinea unguium: Secondary | ICD-10-CM

## 2023-12-08 DIAGNOSIS — M79675 Pain in left toe(s): Secondary | ICD-10-CM | POA: Diagnosis not present

## 2023-12-08 DIAGNOSIS — M79674 Pain in right toe(s): Secondary | ICD-10-CM | POA: Diagnosis not present

## 2023-12-08 NOTE — Progress Notes (Signed)
   Chief Complaint  Patient presents with   Nail Problem    Pt is here for [facility].    SUBJECTIVE Patient presents to office today complaining of elongated, thickened nails that cause pain while ambulating in shoes.  Patient is unable to trim their own nails.   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 since 1996   Insulin  dependent diabetes mellitus    Mitral valve prolapse    Neuropathy    Osteoarthritis    bilateral knee   Seasonal allergies    Sleep apnea    no CPAP use in several months, per pt.   Systemic lupus erythematosus (HCC)    Trigger finger, left middle finger 01/2017    OBJECTIVE General Patient is awake, alert, and oriented x 3 and in no acute distress. Derm Skin is dry and supple bilateral. Negative open lesions or macerations. Remaining integument unremarkable. Nails are tender, long, thickened and dystrophic with subungual debris, consistent with onychomycosis, 1-5 bilateral. No signs of infection noted. Vasc  DP and PT pedal pulses palpable bilaterally. Temperature gradient within normal limits.  Neuro Epicritic and protective threshold sensation grossly intact bilaterally.  Musculoskeletal Exam pes planus deformity noted to the bilateral feet left greater than the right.  There is collapse of the medial longitudinal arch and tenderness along the posterior tibial tendon of the left specifically  ASSESSMENT 1.  Pain due to onychomycosis of toenails both 2.  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  3.  PTTD LLE 4.  Diabetes mellitus with peripheral polyneuropathy  PLAN OF CARE 1.  Patient evaluated today.   2. Instructed to maintain good pedal hygiene and foot care.  3. Mechanical debridement of nails 1-5 bilaterally performed using a nail nipper. Filed with dremel without incident.  4.  Continue conservative management of the pes planovalgus deformity bilateral.  Continue wearing diabetic shoes and insoles to prevent collapse of the feet bilateral during weightbearing 5.  Return to clinic in 3 months for routine foot care   Thresa EMERSON Sar, DPM [facility]  Dr. Thresa EMERSON Sar, DPM    [address] [place], [place] [address]                Office [phone]  Fax [phone]

## 2023-12-13 DIAGNOSIS — E1169 Type 2 diabetes mellitus with other specified complication: Secondary | ICD-10-CM | POA: Diagnosis not present

## 2023-12-17 DIAGNOSIS — I10 Essential (primary) hypertension: Secondary | ICD-10-CM | POA: Diagnosis not present

## 2023-12-17 DIAGNOSIS — M3210 Systemic lupus erythematosus, organ or system involvement unspecified: Secondary | ICD-10-CM | POA: Diagnosis not present

## 2023-12-17 DIAGNOSIS — E1142 Type 2 diabetes mellitus with diabetic polyneuropathy: Secondary | ICD-10-CM | POA: Diagnosis not present

## 2023-12-17 DIAGNOSIS — E1165 Type 2 diabetes mellitus with hyperglycemia: Secondary | ICD-10-CM | POA: Diagnosis not present

## 2023-12-17 DIAGNOSIS — Z7901 Long term (current) use of anticoagulants: Secondary | ICD-10-CM | POA: Diagnosis not present

## 2023-12-17 DIAGNOSIS — C9110 Chronic lymphocytic leukemia of B-cell type not having achieved remission: Secondary | ICD-10-CM | POA: Diagnosis not present

## 2023-12-28 ENCOUNTER — Encounter (INDEPENDENT_AMBULATORY_CARE_PROVIDER_SITE_OTHER): Admitting: Ophthalmology

## 2023-12-28 DIAGNOSIS — I10 Essential (primary) hypertension: Secondary | ICD-10-CM

## 2023-12-28 DIAGNOSIS — H43813 Vitreous degeneration, bilateral: Secondary | ICD-10-CM | POA: Diagnosis not present

## 2023-12-28 DIAGNOSIS — H33303 Unspecified retinal break, bilateral: Secondary | ICD-10-CM | POA: Diagnosis not present

## 2023-12-28 DIAGNOSIS — E113311 Type 2 diabetes mellitus with moderate nonproliferative diabetic retinopathy with macular edema, right eye: Secondary | ICD-10-CM | POA: Diagnosis not present

## 2023-12-28 DIAGNOSIS — E113292 Type 2 diabetes mellitus with mild nonproliferative diabetic retinopathy without macular edema, left eye: Secondary | ICD-10-CM

## 2023-12-28 DIAGNOSIS — H35033 Hypertensive retinopathy, bilateral: Secondary | ICD-10-CM

## 2023-12-28 DIAGNOSIS — Z794 Long term (current) use of insulin: Secondary | ICD-10-CM | POA: Diagnosis not present

## 2023-12-29 DIAGNOSIS — E1169 Type 2 diabetes mellitus with other specified complication: Secondary | ICD-10-CM | POA: Diagnosis not present

## 2024-01-23 NOTE — Progress Notes (Unsigned)
 [facility] OFFICE PROGRESS NOTE  Patient Care Team: Benjamine Aland, MD as PCP - General (Family Medicine)  78 y.o. female with history of DM2, OSA, lupus, PE on warfarin being seen for lymphocytosis.   Flow cytometry showed CLL. Borderline anemia though she is 78 yo and normal platelet.    Report of fatigue.  B12 is low.  Recommend start B12 supplement.  Assessment & Plan CLL (chronic lymphocytic leukemia) (HCC) CBC, CMP, LDH in 3 months B12 deficiency Start b12 1000mcg daily Repeat labs in 3 months  No orders of the defined types were placed in this encounter.    Pauletta JAYSON Chihuahua, MD  INTERVAL HISTORY: Patient returns for follow-up.  Oncology History  CLL (chronic lymphocytic leukemia) (HCC)  10/25/2023 Initial Diagnosis   CLL (chronic lymphocytic leukemia) (HCC)   10/25/2023 Cancer Staging   Staging form: Chronic Lymphocytic Leukemia / Small Lymphocytic Lymphoma, AJCC 8th Edition - Clinical: Modified Rai Stage 0 (Modified Rai risk: Low, Lymphocytosis: Present, Adenopathy: Absent, Organomegaly: Absent, Anemia: Absent, Thrombocytopenia: Absent) - Signed by Ch[place]ua Pauletta JAYSON, MD on 10/25/2023 Stage prefix: Initial diagnosis   10/26/2023 Pathology Results   FISH 64.5% OF NUCLEI POSITIVE FOR TRISOMY 12       PHYSICAL EXAMINATION: ECOG PERFORMANCE STATUS: {CHL ONC ECOG [phone]}  There were no vitals filed for this visit. There were no vitals filed for this visit.  GENERAL: alert, no distress and comfortable SKIN: skin color normal and no jaundice or bruising or petechiae on exposed skin EYES: normal, sclera clear OROPHARYNX: no exudate  NECK: No palpable mass LYMPH:  no palpable cervical, axillary lymphadenopathy  LUNGS: clear to auscultation and no wheeze or rales with normal breathing effort HEART: regular rate & rhythm  ABDOMEN: abdomen soft, non-tender and nondistended. Musculoskeletal: no edema NEURO: no focal motor/sensory deficits  Relevant  data reviewed during this visit included labs.  New labs ordered.

## 2024-01-23 NOTE — Assessment & Plan Note (Addendum)
 CBC, CMP, LDH in 3 months

## 2024-01-23 NOTE — Assessment & Plan Note (Addendum)
 Start b12 1000 mcg daily  Repeat labs in 3 months

## 2024-01-24 ENCOUNTER — Encounter (INDEPENDENT_AMBULATORY_CARE_PROVIDER_SITE_OTHER): Admitting: Ophthalmology

## 2024-01-24 DIAGNOSIS — E113311 Type 2 diabetes mellitus with moderate nonproliferative diabetic retinopathy with macular edema, right eye: Secondary | ICD-10-CM

## 2024-01-24 DIAGNOSIS — Z794 Long term (current) use of insulin: Secondary | ICD-10-CM

## 2024-01-24 DIAGNOSIS — E113292 Type 2 diabetes mellitus with mild nonproliferative diabetic retinopathy without macular edema, left eye: Secondary | ICD-10-CM | POA: Diagnosis not present

## 2024-01-24 DIAGNOSIS — H35372 Puckering of macula, left eye: Secondary | ICD-10-CM

## 2024-01-24 DIAGNOSIS — I10 Essential (primary) hypertension: Secondary | ICD-10-CM

## 2024-01-24 DIAGNOSIS — H35033 Hypertensive retinopathy, bilateral: Secondary | ICD-10-CM

## 2024-01-24 DIAGNOSIS — H33303 Unspecified retinal break, bilateral: Secondary | ICD-10-CM

## 2024-01-24 DIAGNOSIS — H43813 Vitreous degeneration, bilateral: Secondary | ICD-10-CM

## 2024-01-27 ENCOUNTER — Inpatient Hospital Stay (HOSPITAL_BASED_OUTPATIENT_CLINIC_OR_DEPARTMENT_OTHER)

## 2024-01-27 ENCOUNTER — Inpatient Hospital Stay

## 2024-01-27 VITALS — BP 130/57 | HR 84 | Temp 97.3°F | Resp 20 | Wt 202.7 lb

## 2024-01-27 DIAGNOSIS — E538 Deficiency of other specified B group vitamins: Secondary | ICD-10-CM

## 2024-01-27 DIAGNOSIS — C9110 Chronic lymphocytic leukemia of B-cell type not having achieved remission: Secondary | ICD-10-CM | POA: Diagnosis not present

## 2024-01-27 DIAGNOSIS — Z7901 Long term (current) use of anticoagulants: Secondary | ICD-10-CM | POA: Insufficient documentation

## 2024-01-27 DIAGNOSIS — Z86718 Personal history of other venous thrombosis and embolism: Secondary | ICD-10-CM | POA: Insufficient documentation

## 2024-01-27 LAB — CBC WITH DIFFERENTIAL (CANCER CENTER ONLY)
Basophils Absolute: 0.4 K/uL — ABNORMAL HIGH (ref 0.0–0.1)
Basophils Relative: 1 %
Eosinophils Absolute: 0.4 K/uL (ref 0.0–0.5)
Eosinophils Relative: 1 %
HCT: 37.4 % (ref 36.0–46.0)
Hemoglobin: 11.9 g/dL — ABNORMAL LOW (ref 12.0–15.0)
Lymphocytes Relative: 83 %
Lymphs Abs: 37.1 K/uL — ABNORMAL HIGH (ref 0.7–4.0)
MCH: 27.7 pg (ref 26.0–34.0)
MCHC: 31.8 g/dL (ref 30.0–36.0)
MCV: 87.2 fL (ref 80.0–100.0)
Monocytes Absolute: 1.8 K/uL — ABNORMAL HIGH (ref 0.1–1.0)
Monocytes Relative: 4 %
Neutro Abs: 4.9 K/uL (ref 1.7–7.7)
Neutrophils Relative %: 11 %
Platelet Count: 229 K/uL (ref 150–400)
RBC: 4.29 MIL/uL (ref 3.87–5.11)
RDW: 16.1 % — ABNORMAL HIGH (ref 11.5–15.5)
Smear Review: NORMAL
WBC Count: 44.7 K/uL — ABNORMAL HIGH (ref 4.0–10.5)
nRBC: 0 % (ref 0.0–0.2)

## 2024-01-27 LAB — CMP (CANCER CENTER ONLY)
ALT: 12 U/L (ref 0–44)
AST: 21 U/L (ref 15–41)
Albumin: 3.9 g/dL (ref 3.5–5.0)
Alkaline Phosphatase: 111 U/L (ref 38–126)
Anion gap: 6 (ref 5–15)
BUN: 17 mg/dL (ref 8–23)
CO2: 27 mmol/L (ref 22–32)
Calcium: 9.2 mg/dL (ref 8.9–10.3)
Chloride: 107 mmol/L (ref 98–111)
Creatinine: 1.24 mg/dL — ABNORMAL HIGH (ref 0.44–1.00)
GFR, Estimated: 44 mL/min — ABNORMAL LOW (ref >60)
Glucose, Bld: 112 mg/dL — ABNORMAL HIGH (ref 70–99)
Potassium: 4.3 mmol/L (ref 3.5–5.1)
Sodium: 140 mmol/L (ref 135–145)
Total Bilirubin: 0.4 mg/dL (ref 0.0–1.2)
Total Protein: 6.6 g/dL (ref 6.5–8.1)

## 2024-01-27 LAB — VITAMIN B12: Vitamin B-12: 582 pg/mL (ref 180–914)

## 2024-01-27 LAB — LACTATE DEHYDROGENASE: LDH: 583 U/L — ABNORMAL HIGH (ref 105–235)

## 2024-01-27 NOTE — Addendum Note (Signed)
 Addended by: JOHNNYE GRIST A on: 01/27/2024 03:38 PM   Modules accepted: Orders

## 2024-01-27 NOTE — Assessment & Plan Note (Addendum)
 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 Patient was advised by previous provider to continue lifelong anticoagulation.  Discussed apixaban 2.5 mg twice daily as an alternative for prevention. Discussed ED precaution on anticoagulation.

## 2024-01-31 ENCOUNTER — Ambulatory Visit: Payer: Self-pay

## 2024-02-01 NOTE — Telephone Encounter (Signed)
-----   Message from Pauletta JAYSON Chihuahua sent at 01/31/2024  4:47 PM EST ----- Laysha Childers please let her know the kidney function is slight worse.  Please drink 64 ounces of fluids daily.  Ensure good urine output thank you.  Will repeat in 3 months few days before next visit.  Thank you. ----- Message ----- From: Rebecka, [facility] [place] Sent: 01/27/2024   2:44 PM EST To: Pauletta JAYSON Chihuahua, MD

## 2024-02-01 NOTE — Telephone Encounter (Signed)
 Notified of message below

## 2024-02-29 ENCOUNTER — Encounter (INDEPENDENT_AMBULATORY_CARE_PROVIDER_SITE_OTHER): Admitting: Ophthalmology

## 2024-02-29 DIAGNOSIS — H35033 Hypertensive retinopathy, bilateral: Secondary | ICD-10-CM | POA: Diagnosis not present

## 2024-02-29 DIAGNOSIS — H33303 Unspecified retinal break, bilateral: Secondary | ICD-10-CM | POA: Diagnosis not present

## 2024-02-29 DIAGNOSIS — E113311 Type 2 diabetes mellitus with moderate nonproliferative diabetic retinopathy with macular edema, right eye: Secondary | ICD-10-CM | POA: Diagnosis not present

## 2024-02-29 DIAGNOSIS — Z794 Long term (current) use of insulin: Secondary | ICD-10-CM | POA: Diagnosis not present

## 2024-02-29 DIAGNOSIS — E113392 Type 2 diabetes mellitus with moderate nonproliferative diabetic retinopathy without macular edema, left eye: Secondary | ICD-10-CM | POA: Diagnosis not present

## 2024-02-29 DIAGNOSIS — H43813 Vitreous degeneration, bilateral: Secondary | ICD-10-CM | POA: Diagnosis not present

## 2024-02-29 DIAGNOSIS — I10 Essential (primary) hypertension: Secondary | ICD-10-CM | POA: Diagnosis not present

## 2024-02-29 DIAGNOSIS — H35372 Puckering of macula, left eye: Secondary | ICD-10-CM | POA: Diagnosis not present

## 2024-03-01 ENCOUNTER — Encounter: Payer: Self-pay | Admitting: Podiatry

## 2024-03-01 ENCOUNTER — Ambulatory Visit: Admitting: Podiatry

## 2024-03-01 VITALS — Ht 63.0 in | Wt 202.7 lb

## 2024-03-01 DIAGNOSIS — B351 Tinea unguium: Secondary | ICD-10-CM | POA: Diagnosis not present

## 2024-03-01 DIAGNOSIS — M79675 Pain in left toe(s): Secondary | ICD-10-CM | POA: Diagnosis not present

## 2024-03-01 DIAGNOSIS — M79674 Pain in right toe(s): Secondary | ICD-10-CM

## 2024-03-01 NOTE — Progress Notes (Signed)
   Chief Complaint  Patient presents with   Nail Problem    Pt is here for [facility].    SUBJECTIVE Patient presents to office today complaining of elongated, thickened nails that cause pain while ambulating in shoes.  Patient is unable to trim their own nails.   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 since 1996   Insulin  dependent diabetes mellitus    Mitral valve prolapse    Neuropathy    Osteoarthritis    bilateral knee   Seasonal allergies    Sleep apnea    no CPAP use in several months, per pt.   Systemic lupus erythematosus (HCC)    Trigger finger, left middle finger 01/2017    OBJECTIVE General Patient is awake, alert, and oriented x 3 and in no acute distress. Derm Skin is dry and supple bilateral. Negative open lesions or macerations. Remaining integument unremarkable. Nails are tender, long, thickened and dystrophic with subungual debris, consistent with onychomycosis, 1-5 bilateral. No signs of infection noted. Vasc  DP and PT pedal pulses palpable bilaterally. Temperature gradient within normal limits.  Neuro Epicritic and protective threshold sensation grossly intact bilaterally.  Musculoskeletal Exam pes planus deformity noted to the bilateral feet left greater than the right.  There is collapse of the medial longitudinal arch and tenderness along the posterior tibial tendon of the left specifically  ASSESSMENT 1.  Pain due to onychomycosis of toenails both 2.  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  3.  PTTD LLE 4.  Diabetes mellitus with peripheral polyneuropathy  PLAN OF CARE 1.  Patient evaluated today.   2. Instructed to maintain good pedal hygiene and foot care.  3. Mechanical debridement of nails 1-5 bilaterally performed using a nail nipper. Filed with dremel without incident.  4.  Continue conservative management of the pes planovalgus deformity bilateral.  Continue wearing diabetic shoes and insoles to prevent collapse of the feet bilateral during weightbearing 5.  Return to clinic in 3 months for routine foot care   Thresa EMERSON Sar, DPM [facility]  Dr. Thresa EMERSON Sar, DPM    [address] [place], [place] [address]                Office [phone]  Fax [phone]

## 2024-03-13 ENCOUNTER — Ambulatory Visit: Admitting: Podiatry

## 2024-04-04 ENCOUNTER — Encounter (INDEPENDENT_AMBULATORY_CARE_PROVIDER_SITE_OTHER): Admitting: Ophthalmology

## 2024-04-04 DIAGNOSIS — H35033 Hypertensive retinopathy, bilateral: Secondary | ICD-10-CM | POA: Diagnosis not present

## 2024-04-04 DIAGNOSIS — I10 Essential (primary) hypertension: Secondary | ICD-10-CM | POA: Diagnosis not present

## 2024-04-04 DIAGNOSIS — E113311 Type 2 diabetes mellitus with moderate nonproliferative diabetic retinopathy with macular edema, right eye: Secondary | ICD-10-CM

## 2024-04-04 DIAGNOSIS — H33303 Unspecified retinal break, bilateral: Secondary | ICD-10-CM

## 2024-04-04 DIAGNOSIS — Z794 Long term (current) use of insulin: Secondary | ICD-10-CM | POA: Diagnosis not present

## 2024-04-04 DIAGNOSIS — E113292 Type 2 diabetes mellitus with mild nonproliferative diabetic retinopathy without macular edema, left eye: Secondary | ICD-10-CM | POA: Diagnosis not present

## 2024-04-04 DIAGNOSIS — H43813 Vitreous degeneration, bilateral: Secondary | ICD-10-CM | POA: Diagnosis not present

## 2024-05-02 ENCOUNTER — Inpatient Hospital Stay

## 2024-05-08 ENCOUNTER — Encounter (INDEPENDENT_AMBULATORY_CARE_PROVIDER_SITE_OTHER): Admitting: Ophthalmology

## 2024-05-31 ENCOUNTER — Ambulatory Visit: Admitting: Podiatry
